# Patient Record
Sex: Male | Born: 1935 | Race: White | Hispanic: No | Smoking: Never smoker
Health system: Southern US, Community
[De-identification: ages and names within clinical notes are randomized; demographics above are authoritative.]

## PROBLEM LIST (undated history)

## (undated) DIAGNOSIS — I10 Essential (primary) hypertension: Secondary | ICD-10-CM

## (undated) DIAGNOSIS — M1990 Unspecified osteoarthritis, unspecified site: Secondary | ICD-10-CM

## (undated) DIAGNOSIS — N184 Chronic kidney disease, stage 4 (severe): Secondary | ICD-10-CM

## (undated) DIAGNOSIS — I639 Cerebral infarction, unspecified: Secondary | ICD-10-CM

## (undated) DIAGNOSIS — K5790 Diverticulosis of intestine, part unspecified, without perforation or abscess without bleeding: Secondary | ICD-10-CM

## (undated) DIAGNOSIS — R32 Unspecified urinary incontinence: Secondary | ICD-10-CM

## (undated) DIAGNOSIS — I219 Acute myocardial infarction, unspecified: Secondary | ICD-10-CM

## (undated) DIAGNOSIS — K219 Gastro-esophageal reflux disease without esophagitis: Secondary | ICD-10-CM

## (undated) DIAGNOSIS — C61 Malignant neoplasm of prostate: Secondary | ICD-10-CM

## (undated) DIAGNOSIS — I779 Disorder of arteries and arterioles, unspecified: Secondary | ICD-10-CM

## (undated) DIAGNOSIS — K8020 Calculus of gallbladder without cholecystitis without obstruction: Secondary | ICD-10-CM

## (undated) DIAGNOSIS — Z951 Presence of aortocoronary bypass graft: Secondary | ICD-10-CM

## (undated) DIAGNOSIS — I739 Peripheral vascular disease, unspecified: Secondary | ICD-10-CM

## (undated) HISTORY — DX: Malignant neoplasm of prostate: C61

## (undated) HISTORY — PX: ROTATOR CUFF REPAIR: SHX139

## (undated) HISTORY — DX: Presence of aortocoronary bypass graft: Z95.1

## (undated) HISTORY — DX: Peripheral vascular disease, unspecified: I73.9

## (undated) HISTORY — DX: Disorder of arteries and arterioles, unspecified: I77.9

## (undated) HISTORY — PX: CHOLECYSTECTOMY, LAPAROSCOPIC: SHX56

## (undated) HISTORY — PX: OTHER SURGICAL HISTORY: SHX169

## (undated) HISTORY — DX: Essential (primary) hypertension: I10

## (undated) HISTORY — DX: Cerebral infarction, unspecified: I63.9

## (undated) HISTORY — PX: CRYOTHERAPY: SHX1416

## (undated) HISTORY — DX: Diverticulosis of intestine, part unspecified, without perforation or abscess without bleeding: K57.90

## (undated) HISTORY — PX: BACK SURGERY: SHX140

## (undated) HISTORY — DX: Calculus of gallbladder without cholecystitis without obstruction: K80.20

## (undated) HISTORY — DX: Chronic kidney disease, stage 4 (severe): N18.4

---

## 1999-02-10 HISTORY — PX: CORONARY ARTERY BYPASS GRAFT: SHX141

## 2001-11-24 ENCOUNTER — Encounter: Payer: Self-pay | Admitting: Emergency Medicine

## 2001-11-24 ENCOUNTER — Emergency Department (HOSPITAL_COMMUNITY): Admission: EM | Admit: 2001-11-24 | Discharge: 2001-11-24 | Payer: Self-pay | Admitting: Emergency Medicine

## 2004-04-30 ENCOUNTER — Emergency Department (HOSPITAL_COMMUNITY): Admission: EM | Admit: 2004-04-30 | Discharge: 2004-04-30 | Payer: Self-pay | Admitting: Emergency Medicine

## 2004-05-29 ENCOUNTER — Encounter (INDEPENDENT_AMBULATORY_CARE_PROVIDER_SITE_OTHER): Payer: Self-pay | Admitting: Specialist

## 2004-05-29 ENCOUNTER — Ambulatory Visit (HOSPITAL_COMMUNITY): Admission: RE | Admit: 2004-05-29 | Discharge: 2004-05-29 | Payer: Self-pay | Admitting: Gastroenterology

## 2004-08-18 ENCOUNTER — Emergency Department (HOSPITAL_COMMUNITY): Admission: EM | Admit: 2004-08-18 | Discharge: 2004-08-18 | Payer: Self-pay | Admitting: Emergency Medicine

## 2005-11-25 ENCOUNTER — Encounter: Admission: RE | Admit: 2005-11-25 | Discharge: 2005-11-25 | Payer: Self-pay | Admitting: Gastroenterology

## 2006-02-24 ENCOUNTER — Encounter: Admission: RE | Admit: 2006-02-24 | Discharge: 2006-02-24 | Payer: Self-pay | Admitting: Family Medicine

## 2006-06-03 ENCOUNTER — Ambulatory Visit (HOSPITAL_COMMUNITY): Admission: RE | Admit: 2006-06-03 | Discharge: 2006-06-03 | Payer: Self-pay | Admitting: Surgery

## 2006-06-03 ENCOUNTER — Encounter (INDEPENDENT_AMBULATORY_CARE_PROVIDER_SITE_OTHER): Payer: Self-pay | Admitting: Specialist

## 2007-06-17 ENCOUNTER — Encounter: Admission: RE | Admit: 2007-06-17 | Discharge: 2007-06-17 | Payer: Self-pay | Admitting: Specialist

## 2007-07-07 ENCOUNTER — Encounter: Admission: RE | Admit: 2007-07-07 | Discharge: 2007-07-07 | Payer: Self-pay | Admitting: Interventional Cardiology

## 2008-04-20 ENCOUNTER — Encounter: Admission: RE | Admit: 2008-04-20 | Discharge: 2008-04-20 | Payer: Self-pay | Admitting: Family Medicine

## 2009-07-30 ENCOUNTER — Encounter: Admission: RE | Admit: 2009-07-30 | Discharge: 2009-07-30 | Payer: Self-pay | Admitting: Family Medicine

## 2009-11-08 ENCOUNTER — Ambulatory Visit (HOSPITAL_BASED_OUTPATIENT_CLINIC_OR_DEPARTMENT_OTHER): Admission: RE | Admit: 2009-11-08 | Discharge: 2009-11-08 | Payer: Self-pay | Admitting: Urology

## 2010-01-15 ENCOUNTER — Encounter
Admission: RE | Admit: 2010-01-15 | Discharge: 2010-01-15 | Payer: Self-pay | Source: Home / Self Care | Admitting: Family Medicine

## 2010-02-14 ENCOUNTER — Encounter
Admission: RE | Admit: 2010-02-14 | Discharge: 2010-02-14 | Payer: Self-pay | Source: Home / Self Care | Attending: Specialist | Admitting: Specialist

## 2010-04-18 ENCOUNTER — Emergency Department (HOSPITAL_COMMUNITY)
Admission: EM | Admit: 2010-04-18 | Discharge: 2010-04-18 | Disposition: A | Discharge status: Home / Self Care | Payer: Medicare Other | Attending: Emergency Medicine | Admitting: Emergency Medicine

## 2010-04-18 ENCOUNTER — Emergency Department (HOSPITAL_COMMUNITY): Payer: Medicare Other

## 2010-04-18 DIAGNOSIS — IMO0002 Reserved for concepts with insufficient information to code with codable children: Secondary | ICD-10-CM | POA: Insufficient documentation

## 2010-04-18 DIAGNOSIS — W298XXA Contact with other powered powered hand tools and household machinery, initial encounter: Secondary | ICD-10-CM | POA: Insufficient documentation

## 2010-04-18 DIAGNOSIS — Z79899 Other long term (current) drug therapy: Secondary | ICD-10-CM | POA: Insufficient documentation

## 2010-04-18 DIAGNOSIS — Z8673 Personal history of transient ischemic attack (TIA), and cerebral infarction without residual deficits: Secondary | ICD-10-CM | POA: Insufficient documentation

## 2010-04-18 DIAGNOSIS — M79609 Pain in unspecified limb: Secondary | ICD-10-CM | POA: Insufficient documentation

## 2010-04-18 DIAGNOSIS — S62639B Displaced fracture of distal phalanx of unspecified finger, initial encounter for open fracture: Secondary | ICD-10-CM | POA: Insufficient documentation

## 2010-04-18 DIAGNOSIS — Y92009 Unspecified place in unspecified non-institutional (private) residence as the place of occurrence of the external cause: Secondary | ICD-10-CM | POA: Insufficient documentation

## 2010-04-18 DIAGNOSIS — S6980XA Other specified injuries of unspecified wrist, hand and finger(s), initial encounter: Secondary | ICD-10-CM | POA: Insufficient documentation

## 2010-04-18 DIAGNOSIS — I10 Essential (primary) hypertension: Secondary | ICD-10-CM | POA: Insufficient documentation

## 2010-04-18 DIAGNOSIS — Z8546 Personal history of malignant neoplasm of prostate: Secondary | ICD-10-CM | POA: Insufficient documentation

## 2010-04-18 DIAGNOSIS — Z7982 Long term (current) use of aspirin: Secondary | ICD-10-CM | POA: Insufficient documentation

## 2010-04-18 DIAGNOSIS — S6990XA Unspecified injury of unspecified wrist, hand and finger(s), initial encounter: Secondary | ICD-10-CM | POA: Insufficient documentation

## 2010-04-21 NOTE — Consult Note (Signed)
Brandon Harris, Brandon Harris NO.:  [id number]  MEDICAL RECORD NO.:  [id number]           PATIENT TYPE:  E  LOCATION:  [facility]                         FACILITY:  [facility]  PHYSICIAN:  Leanora Cover, MD        DATE OF BIRTH:  [birth year]  DATE OF CONSULTATION:  04/18/2010 DATE OF DISCHARGE:  04/18/2010                                CONSULTATION   Consult is from emergency department.  Consult is for left index finger amputation.  HISTORY:  Brandon Harris is a 77 year old right-hand dominant white male, who was using a wood splitter today.  He states his left index finger became trapped between the frame and a piece of wood.  It was not caught in the wedge.  This removed the tip of the finger.  He reports no other injuries.  No previous injuries.  His tetanus was updated by the emergency department.  He has been given 1 g of IV Ancef.  He was given a digital block by the emergency department.  He was resting comfortably in a hospital stretcher.  He has had a previous amputation of the right index finger approximately 4 years ago.  ALLERGIES:  No known drug allergies.  PAST MEDICAL HISTORY: 1. Prostate cancer. 2. Hypertension. 3. Stroke. 4. Coronary artery disease.  PAST SURGICAL HISTORY: 1. Coronary artery bypass grafting. 2. Back surgeries. 3. Left rotator cuff repair. 4. Prostate cancer cryo therapy.  MEDICATIONS:  Aspirin, omeprazole, zolpidem, losartan, Vesicare, Tamsulosin, and Caduet.  SOCIAL HISTORY:  Brandon Harris is retired.  He does not smoke or use alcohol.  REVIEW OF SYSTEMS:  Thirteen-point review of systems negative.  PHYSICAL EXAMINATION:  VITAL SIGNS:  Temperature 98.2, pulse 63, respirations 16, BP 151/68. GENERAL:  Alert and oriented x3, well developed, well nourished, resting comfortably in hospital stretcher. EXTREMITIES:  Bilateral upper extremities were distally neurovascularly intact in radial, median, and ulnar distributions.  He has  intact sensation and capillary refill in all fingertips with the exception of the left index finger, which has had a digital block.  He is actively bleeding.  He can flex and extend the IP joints and crosses fingers.  He is missing the index finger on the right hand.  He has no tenderness to palpation.  No wounds in the right upper extremity.  With the exception of the index finger on the left hand, he has no wounds, no tenderness to palpation.  The left index finger, he has amputated the tip of the finger approximately halfway through the nail.  There was now a volar skin loss, but there is good subcutaneous tissue remaining.  The bone is exposed.  The nail has been avulsed.  RADIOGRAPHY:  AP, lateral, and oblique views of the left index finger show tuft fracture of the distal phalanx.  There is no radiopaque foreign body.  ASSESSMENT AND PLAN:  Left index finger tip amputation.  I discussed with Brandon Harris the nature of the injury.  I have recommended revision amputation.  Risks, benefits, and alternatives of procedure were discussed, and he wished to proceed.  Digital block had been  performed by the emergency department.  The wound was copiously irrigated with 1000 mL of sterile saline and Betadine solution.  The index finger was then prepped with Betadine.  It was draped sterilely using towels.  A Penrose drain was used as a tourniquet and was up for approximately 30 minutes.  The bone was rongeured back until soft tissue coverage could be obtained.  Once this was done, a 6-0 chromic gut suture and 5-0 Monocryl suture was used to repair the soft tissue over the end of the bone.  Full bony coverage was obtained.  It was felt that there was appropriate padding over the bone as well.  The Penrose drain was removed as the tourniquet.  There was light bleeding, but no active significant bleeding.  A piece of Xeroform was placed in the nail fold.  The wounds were then dressed  with sterile Xeroform.  The wound was dressed with 4 x 4's and wrapped with Kling and Coban dressing lightly.  Penrose drain had been removed.  He will see me in the office in 1 week for followup.  He will be given Percocet 5/325 one to two p.o. q.6 h p.r.n. pain, dispensed #40 and Bactrim DS one p.o. b.i.d. x7 days.     Leanora Cover, MD     KK/MEDQ  D:  04/18/2010  T:  04/19/2010  Job:  [id number]  Electronically Signed by Leanora Cover  on 04/21/2010 05:08:07 PM

## 2010-04-24 LAB — POCT I-STAT 4, (NA,K, GLUC, HGB,HCT)
Glucose, Bld: 110 mg/dL — ABNORMAL HIGH (ref 70–99)
HCT: 43 % (ref 39.0–52.0)
Hemoglobin: 14.6 g/dL (ref 13.0–17.0)
Potassium: 4.3 mEq/L (ref 3.5–5.1)
Sodium: 141 mEq/L (ref 135–145)

## 2010-06-27 NOTE — Op Note (Signed)
Brandon Harris, Brandon Harris                 ACCOUNT NO.:  [id number]   MEDICAL RECORD NO.:  [id number]          PATIENT TYPE:  AMB   LOCATION:  SDS                          FACILITY:  [facility]   PHYSICIAN:  Isabel Caprice. Hassell Done, MD  DATE OF BIRTH:  07-05-1928   DATE OF PROCEDURE:  06/03/2006  DATE OF DISCHARGE:                               OPERATIVE REPORT   PREOPERATIVE DIAGNOSIS:  This is a 77 year old white male with recurrent  right upper quadrant abdominal pain with gallstones.   PROCEDURE:  Laparoscopic cholecystectomy/ intraoperative cholangiogram.   SURGEON:  Isabel Caprice. Hassell Done, M.D.   ASSISTANT:  Haywood Lasso, M.D.   ANESTHESIA:  General endotracheal.   FINDINGS:  Phrygian cap of the gallbladder with large cystic duct with a  low entrance onto the common duct which was a little dilated, but with a  trifurcation real high and good filling of intrahepatic ducts and prompt  flow into the duodenum.  Evidence of chronic cholecystitis with  gallbladder stuck with chronic adhesions.   DESCRIPTION OF PROCEDURE:  Brandon Harris was taken to room 17 on Thursday,  06/03/2006, and given general anesthesia.  The abdomen was prepped with  Techni-Care and draped sterilely.  A longitudinal incision was made down  near the umbilicus and the Hassan cannula was inserted without  difficulty.  The abdomen was insufflated.  Three trocars were placed in  the upper abdomen, including the upper midline and two laterally.  The  gallbladder was grasped at the phrygian cap and brought upward.  It  looked like there were two little humps of gallbladder up near the top  of the fundus and once retracted, retraction was maintained.  Chronic  adhesions to the lower portion were stripped away.  Calot's triangle was  dissected away and a critical view was obtained.  Clips were placed on  the cystic artery and on the cystic duct proximally and I incised the  cystic duct and inserted a Reddick catheter.  A dynamic  cholangiogram  was performed which showed a filling of a plump common bile duct with  free flow into the duodenum.  No filling defects were noted.  There was  a low takeoff of the cystic duct and there were no accessory ducts noted  onto the cystic duct, but there was a good trifurcation up inside the  liver with intrahepatic radicals well seen.  The cystic duct was then  triple clipped and divided, Cystic artery was triple clipped and  divided, and the gallbladder was removed from the gallbladder bed.  There was a venous lake up in the top noted that sort of wanted to bleed  intermittently, although it did not actually bleed that much.  I went  ahead and put FloSeal on it.  Area was irrigated.  No bile leaks were  noted.  No bleeding was seen.  Looked around and surveyed the rest of  the abdomen and it looked fine.  The umbilical port was repaired with  multiple sutures of 0-Vicryl under laparoscopic vision and then the skin  was approximated with 4-0 Vicryl, Benzoin  and Steri-Strips.  The patient  had tolerated the procedure well and was taken to the recovery room in  satisfactory condition.  He expressed an interest in trying to go home  later in the day and he will be observed to see if that can happen.   FINAL DIAGNOSES:  1. Chronic cholecystitis.  2. Cholelithiasis.      Isabel Caprice Hassell Done, MD  Electronically Signed     MBM/MEDQ  D:  06/03/2006  T:  06/03/2006  Job:  [id number]   cc:   Belva Crome, M.D.  Earle Gell, M.D.  Antony Contras, M.D.

## 2010-06-27 NOTE — Consult Note (Signed)
   NAMEEVENS, SODERBERG                           ACCOUNT NO.:  [id number]   MEDICAL RECORD NO.:  [id number]                   PATIENT TYPE:  INP   LOCATION:  1823                                 FACILITY:  [facility]   PHYSICIAN:  Sheral Apley. Burney Gauze, M.D.           DATE OF BIRTH:  [birth year]   DATE OF CONSULTATION:  11/24/2001  DATE OF DISCHARGE:  11/24/2001                                   CONSULTATION   REASON FOR CONSULTATION:  This is a 77 year old, right-hand dominant male  who was using a chainsaw earlier today and sustained injury to his right  thumb with obvious open injury, nail plate avulsion, nail bed laceration,  and open distal phalangeal fracture.  He is 77 years old.   ALLERGIES:  He has no known drug allergies.   MEDICATIONS:  He is currently on Altace and Ecotrin a day, as well as  Lipitor.   PAST MEDICAL HISTORY:  He is status post bypass two or three years ago and  has no symptoms during that period of time.   PAST SURGICAL HISTORY:  Noncontributory.   SOCIAL HISTORY:  Noncontributory.  He does not smoke.  He does not drink.   FAMILY HISTORY:  Noncontributory.   PHYSICAL EXAMINATION:  GENERAL APPEARANCE:  A well-nourished male who is  pleasant.  EXTREMITIES:  On examination of his right upper extremity, he has an obvious  open injury with exposed distal phalangeal bone, fractured nail plate, and  nail bed laceration.  The STL appears to be intact.   LABORATORY DATA:  X-rays show a comminuted fracture of the distal phalanx  and thumb on the right.   IMPRESSION:  A 77 year old male an open fracture of the distal phalanx and  thumb on the right.   PLAN:  We discussed at the point in time recommendations.  He will be taken  to the operating room for an I&D and ORIF of the distal phalanx and  displaced fracture.  We will do this as soon as possible as an outpatient  here at [facility]. [place]. Burney Gauze, M.D.    MAW/MEDQ  D:  11/24/2001  T:  11/26/2001  Job:  [id number]

## 2010-06-27 NOTE — Op Note (Signed)
NAMECOURVOISIER, VREDEVELD NO.:  [id number]   MEDICAL RECORD NO.:  [id number]          PATIENT TYPE:  AMB   LOCATION:  ENDO                         [facility]ACILITY:  [facility]   PHYSICIAN:  Earle Gell, M.D.   DATE OF BIRTH:  Jul 19, 1928   DATE OF PROCEDURE:  05/29/2004  DATE OF DISCHARGE:                                 OPERATIVE REPORT   PROCEDURE:  Colonoscopy and polypectomy.   INDICATIONS:  Mr. Brandon Harris is a 77 year old male, born 01/23/1929.  Mr. Brandon Harris is scheduled to undergo his first screening colonoscopy with  polypectomy to prevent colon cancer.  His daughter was recently diagnosed  with rectal cancer.   ENDOSCOPIST:  Earle Gell, M.D.   PREMEDICATION:  Versed 7 mg, fentanyl  70 mcg.   DESCRIPTION OF PROCEDURE:  After obtaining informed consent, Mr. Brandon Harris was  placed in the left lateral decubitus position.  I administered intravenous  fentanyl and intravenous Versed to achieve conscious sedation for the  procedure.  The patient's blood pressure, oxygen saturation and cardiac  rhythm were monitored throughout the procedure and documented in the medical  record.   Anal inspection was normal.  Digital rectal exam reveals a non-nodular  prostate.  The Olympus adjustable pediatric colonoscope was introduced into  the rectum and advanced to the cecum.  Colonic preparation for the exam  today was satisfactory.   Rectum:  Normal.  Retroflexed view of the distal rectum normal.  Sigmoid colon and descending colon:  Left colonic diverticulosis.  At 40 cm  from the anal verge, a 3 mm pedunculated polyp was removed with  electrocautery snare.  At 35 cm from the anal verge, a 4 mm pedunculated  polyp was removed with electrocautery snare.  Splenic flexure:  Normal.  Transverse colon:  Normal.  Hepatic flexure:  Normal.  Ascending colon:  Normal.  Cecum and ileocecal valve:  From the mid rectum, a 2 mm sessile polyp was  lifted by submucosal saline  injection and removed with the electrocautery  snare.  There was a small amount of bleeding post polypectomy which was  controlled by the application of two endoclips.   ASSESSMENT:  1.  Left colonic diverticulosis.  2.  From the sigmoid colon, a 3 mm pedunculated polyp and a 4 mm      pedunculated polyp were removed.  3.  From the mid cecum 2 mm polyp was removed with the electrocautery snare      and the polypectomy site endoclipped.      MJ/MEDQ  D:  05/29/2004  T:  05/29/2004  Job:  [id number]   cc:   Antony Contras, M.D.  [address]  [place], [place] [address]  Fax: [phone]

## 2010-06-27 NOTE — Op Note (Signed)
Brandon Harris, Brandon Harris                           ACCOUNT NO.:  [id number]   MEDICAL RECORD NO.:  [id number]                   PATIENT TYPE:  INP   LOCATION:  1823                                 FACILITY:  [facility]   PHYSICIAN:  Sheral Apley. Burney Gauze, M.D.           DATE OF BIRTH:  Nov 07, 1928   DATE OF PROCEDURE:  11/24/2001  DATE OF DISCHARGE:                                 OPERATIVE REPORT   PREOPERATIVE DIAGNOSES:  Right thumb open distal phalangeal fracture and  nail bed laceration.   POSTOPERATIVE DIAGNOSES:  Right thumb open distal phalangeal fracture and  nail bed laceration.   PROCEDURE:  Irrigation and debridement of open fracture, right thumb,  laceration repair, nail bed repair and open reduction and internal fixation  using two 0.35 K-wires from distal to proximal across the fracture site.   SURGEON:  Sheral Apley. Burney Gauze, M.D.   ASSISTANT:  None.   ANESTHESIA:  Axillary block.   TOURNIQUET TIME:  Twenty-one minutes.   COMPLICATIONS:  No complications.   DRAINS:  No drains.   DESCRIPTION OF PROCEDURE:  The patient was taken to the operating room and  after the adequate axillary block analgesia, the right upper extremity was  prepped and draped in the usual sterile fashion.  The arm was elevated for 2-  1/2 minutes; the tourniquet was then inflated to 250 mmHg.  At this point in  time, an obvious open injury to the distal aspect of the right thumb was  approached.  There was a complex laceration volarly, going from midline all  the way to the ulnar border.  This was irrigated and debrided of clot and  some nonviable-type material that appeared to have a grease-like consistency  from the saw he was probably using prior to his accident.  The open fracture  was debrided of again clot and nonviable material.  Once this was done, the  5-0 nylon was used to approximate the skin, closing the laceration loosely,  and after that was done, two 0.35 K-wires were driven from the  distal  fragment to the proximal fragment across the fracture site, with care not to  cross the DIP joint.  Intraoperative x-rays showed good reduction in both  the AP and lateral plane.  The wires were then cut outside the skin and bent  upon themselves, then using 6-0 chromic, a complex nail bed laceration was  repaired using five sutures.  The wound was then dressed with Xeroform, 4 x  4's and a volar splint as well as a Coban wrap.  The patient tolerated the  procedure well and went to the recovery room in stable fashion.  Sheral Apley Burney Gauze, M.D.    MAW/MEDQ  D:  11/24/2001  T:  11/25/2001  Job:  [id number]

## 2010-12-30 ENCOUNTER — Other Ambulatory Visit: Payer: Self-pay | Admitting: Gastroenterology

## 2011-02-12 DIAGNOSIS — C61 Malignant neoplasm of prostate: Secondary | ICD-10-CM | POA: Diagnosis not present

## 2011-02-19 DIAGNOSIS — C61 Malignant neoplasm of prostate: Secondary | ICD-10-CM | POA: Diagnosis not present

## 2011-03-06 DIAGNOSIS — G479 Sleep disorder, unspecified: Secondary | ICD-10-CM | POA: Diagnosis not present

## 2011-03-06 DIAGNOSIS — C61 Malignant neoplasm of prostate: Secondary | ICD-10-CM | POA: Diagnosis not present

## 2011-03-06 DIAGNOSIS — K219 Gastro-esophageal reflux disease without esophagitis: Secondary | ICD-10-CM | POA: Diagnosis not present

## 2011-03-06 DIAGNOSIS — I10 Essential (primary) hypertension: Secondary | ICD-10-CM | POA: Diagnosis not present

## 2011-03-06 DIAGNOSIS — E782 Mixed hyperlipidemia: Secondary | ICD-10-CM | POA: Diagnosis not present

## 2011-03-06 DIAGNOSIS — R0989 Other specified symptoms and signs involving the circulatory and respiratory systems: Secondary | ICD-10-CM | POA: Diagnosis not present

## 2011-04-14 DIAGNOSIS — M1710 Unilateral primary osteoarthritis, unspecified knee: Secondary | ICD-10-CM | POA: Diagnosis not present

## 2011-04-15 DIAGNOSIS — L821 Other seborrheic keratosis: Secondary | ICD-10-CM | POA: Diagnosis not present

## 2011-04-15 DIAGNOSIS — Z85828 Personal history of other malignant neoplasm of skin: Secondary | ICD-10-CM | POA: Diagnosis not present

## 2011-05-28 DIAGNOSIS — E785 Hyperlipidemia, unspecified: Secondary | ICD-10-CM | POA: Diagnosis not present

## 2011-05-28 DIAGNOSIS — I10 Essential (primary) hypertension: Secondary | ICD-10-CM | POA: Diagnosis not present

## 2011-05-28 DIAGNOSIS — I6529 Occlusion and stenosis of unspecified carotid artery: Secondary | ICD-10-CM | POA: Diagnosis not present

## 2011-05-28 DIAGNOSIS — I2510 Atherosclerotic heart disease of native coronary artery without angina pectoris: Secondary | ICD-10-CM | POA: Diagnosis not present

## 2011-06-05 DIAGNOSIS — I6529 Occlusion and stenosis of unspecified carotid artery: Secondary | ICD-10-CM | POA: Diagnosis not present

## 2011-08-19 DIAGNOSIS — C61 Malignant neoplasm of prostate: Secondary | ICD-10-CM | POA: Diagnosis not present

## 2011-08-26 DIAGNOSIS — C61 Malignant neoplasm of prostate: Secondary | ICD-10-CM | POA: Diagnosis not present

## 2011-09-03 DIAGNOSIS — K219 Gastro-esophageal reflux disease without esophagitis: Secondary | ICD-10-CM | POA: Diagnosis not present

## 2011-09-03 DIAGNOSIS — E782 Mixed hyperlipidemia: Secondary | ICD-10-CM | POA: Diagnosis not present

## 2011-09-03 DIAGNOSIS — G479 Sleep disorder, unspecified: Secondary | ICD-10-CM | POA: Diagnosis not present

## 2011-09-03 DIAGNOSIS — I10 Essential (primary) hypertension: Secondary | ICD-10-CM | POA: Diagnosis not present

## 2011-09-03 DIAGNOSIS — C61 Malignant neoplasm of prostate: Secondary | ICD-10-CM | POA: Diagnosis not present

## 2011-09-03 DIAGNOSIS — R0989 Other specified symptoms and signs involving the circulatory and respiratory systems: Secondary | ICD-10-CM | POA: Diagnosis not present

## 2011-10-02 DIAGNOSIS — M7520 Bicipital tendinitis, unspecified shoulder: Secondary | ICD-10-CM | POA: Insufficient documentation

## 2011-10-02 DIAGNOSIS — M79609 Pain in unspecified limb: Secondary | ICD-10-CM | POA: Diagnosis not present

## 2011-10-15 DIAGNOSIS — M7520 Bicipital tendinitis, unspecified shoulder: Secondary | ICD-10-CM | POA: Diagnosis not present

## 2011-10-20 DIAGNOSIS — M19019 Primary osteoarthritis, unspecified shoulder: Secondary | ICD-10-CM | POA: Diagnosis not present

## 2011-10-27 DIAGNOSIS — M7520 Bicipital tendinitis, unspecified shoulder: Secondary | ICD-10-CM | POA: Diagnosis not present

## 2011-11-03 DIAGNOSIS — M7520 Bicipital tendinitis, unspecified shoulder: Secondary | ICD-10-CM | POA: Diagnosis not present

## 2011-11-10 DIAGNOSIS — M7520 Bicipital tendinitis, unspecified shoulder: Secondary | ICD-10-CM | POA: Diagnosis not present

## 2011-11-24 DIAGNOSIS — M7520 Bicipital tendinitis, unspecified shoulder: Secondary | ICD-10-CM | POA: Diagnosis not present

## 2011-12-04 DIAGNOSIS — M79609 Pain in unspecified limb: Secondary | ICD-10-CM | POA: Diagnosis not present

## 2011-12-04 DIAGNOSIS — M25529 Pain in unspecified elbow: Secondary | ICD-10-CM | POA: Diagnosis not present

## 2011-12-04 DIAGNOSIS — M25519 Pain in unspecified shoulder: Secondary | ICD-10-CM | POA: Diagnosis not present

## 2011-12-04 DIAGNOSIS — M7520 Bicipital tendinitis, unspecified shoulder: Secondary | ICD-10-CM | POA: Diagnosis not present

## 2011-12-04 DIAGNOSIS — R29898 Other symptoms and signs involving the musculoskeletal system: Secondary | ICD-10-CM | POA: Diagnosis not present

## 2011-12-15 DIAGNOSIS — M1710 Unilateral primary osteoarthritis, unspecified knee: Secondary | ICD-10-CM | POA: Diagnosis not present

## 2012-01-14 DIAGNOSIS — Z23 Encounter for immunization: Secondary | ICD-10-CM | POA: Diagnosis not present

## 2012-01-27 DIAGNOSIS — I6529 Occlusion and stenosis of unspecified carotid artery: Secondary | ICD-10-CM | POA: Diagnosis not present

## 2012-02-23 DIAGNOSIS — C61 Malignant neoplasm of prostate: Secondary | ICD-10-CM | POA: Diagnosis not present

## 2012-02-25 DIAGNOSIS — M1710 Unilateral primary osteoarthritis, unspecified knee: Secondary | ICD-10-CM | POA: Diagnosis not present

## 2012-02-26 DIAGNOSIS — C61 Malignant neoplasm of prostate: Secondary | ICD-10-CM | POA: Diagnosis not present

## 2012-03-03 DIAGNOSIS — M1710 Unilateral primary osteoarthritis, unspecified knee: Secondary | ICD-10-CM | POA: Diagnosis not present

## 2012-03-07 DIAGNOSIS — K219 Gastro-esophageal reflux disease without esophagitis: Secondary | ICD-10-CM | POA: Diagnosis not present

## 2012-03-07 DIAGNOSIS — C61 Malignant neoplasm of prostate: Secondary | ICD-10-CM | POA: Diagnosis not present

## 2012-03-07 DIAGNOSIS — I10 Essential (primary) hypertension: Secondary | ICD-10-CM | POA: Diagnosis not present

## 2012-03-07 DIAGNOSIS — R0989 Other specified symptoms and signs involving the circulatory and respiratory systems: Secondary | ICD-10-CM | POA: Diagnosis not present

## 2012-03-07 DIAGNOSIS — G479 Sleep disorder, unspecified: Secondary | ICD-10-CM | POA: Diagnosis not present

## 2012-03-07 DIAGNOSIS — E782 Mixed hyperlipidemia: Secondary | ICD-10-CM | POA: Diagnosis not present

## 2012-03-09 DIAGNOSIS — M1710 Unilateral primary osteoarthritis, unspecified knee: Secondary | ICD-10-CM | POA: Diagnosis not present

## 2012-03-17 DIAGNOSIS — M1710 Unilateral primary osteoarthritis, unspecified knee: Secondary | ICD-10-CM | POA: Diagnosis not present

## 2012-03-22 DIAGNOSIS — M1710 Unilateral primary osteoarthritis, unspecified knee: Secondary | ICD-10-CM | POA: Diagnosis not present

## 2012-04-18 DIAGNOSIS — Z85828 Personal history of other malignant neoplasm of skin: Secondary | ICD-10-CM | POA: Diagnosis not present

## 2012-04-18 DIAGNOSIS — L821 Other seborrheic keratosis: Secondary | ICD-10-CM | POA: Diagnosis not present

## 2012-04-18 DIAGNOSIS — C44221 Squamous cell carcinoma of skin of unspecified ear and external auricular canal: Secondary | ICD-10-CM | POA: Diagnosis not present

## 2012-04-18 DIAGNOSIS — D485 Neoplasm of uncertain behavior of skin: Secondary | ICD-10-CM | POA: Diagnosis not present

## 2012-05-05 DIAGNOSIS — M1710 Unilateral primary osteoarthritis, unspecified knee: Secondary | ICD-10-CM | POA: Diagnosis not present

## 2012-05-06 DIAGNOSIS — C44221 Squamous cell carcinoma of skin of unspecified ear and external auricular canal: Secondary | ICD-10-CM | POA: Diagnosis not present

## 2012-05-12 DIAGNOSIS — M1710 Unilateral primary osteoarthritis, unspecified knee: Secondary | ICD-10-CM | POA: Diagnosis not present

## 2012-05-19 DIAGNOSIS — M1710 Unilateral primary osteoarthritis, unspecified knee: Secondary | ICD-10-CM | POA: Diagnosis not present

## 2012-05-26 DIAGNOSIS — M1710 Unilateral primary osteoarthritis, unspecified knee: Secondary | ICD-10-CM | POA: Diagnosis not present

## 2012-06-02 DIAGNOSIS — M1710 Unilateral primary osteoarthritis, unspecified knee: Secondary | ICD-10-CM | POA: Diagnosis not present

## 2012-07-15 DIAGNOSIS — M1710 Unilateral primary osteoarthritis, unspecified knee: Secondary | ICD-10-CM | POA: Diagnosis not present

## 2012-08-06 ENCOUNTER — Emergency Department (HOSPITAL_COMMUNITY)
Admission: EM | Admit: 2012-08-06 | Discharge: 2012-08-06 | Disposition: A | Discharge status: Home / Self Care | Payer: Medicare Other | Attending: Emergency Medicine | Admitting: Emergency Medicine

## 2012-08-06 ENCOUNTER — Encounter (HOSPITAL_COMMUNITY): Payer: Self-pay | Admitting: Emergency Medicine

## 2012-08-06 DIAGNOSIS — Z79899 Other long term (current) drug therapy: Secondary | ICD-10-CM | POA: Diagnosis not present

## 2012-08-06 DIAGNOSIS — R5381 Other malaise: Secondary | ICD-10-CM | POA: Diagnosis not present

## 2012-08-06 DIAGNOSIS — N390 Urinary tract infection, site not specified: Secondary | ICD-10-CM | POA: Insufficient documentation

## 2012-08-06 DIAGNOSIS — Z8546 Personal history of malignant neoplasm of prostate: Secondary | ICD-10-CM | POA: Diagnosis not present

## 2012-08-06 DIAGNOSIS — R3915 Urgency of urination: Secondary | ICD-10-CM | POA: Insufficient documentation

## 2012-08-06 DIAGNOSIS — R319 Hematuria, unspecified: Secondary | ICD-10-CM | POA: Insufficient documentation

## 2012-08-06 LAB — URINE MICROSCOPIC-ADD ON

## 2012-08-06 LAB — URINALYSIS, ROUTINE W REFLEX MICROSCOPIC
Bilirubin Urine: NEGATIVE
Glucose, UA: NEGATIVE mg/dL
Ketones, ur: NEGATIVE mg/dL
Nitrite: POSITIVE — AB
Protein, ur: NEGATIVE mg/dL
Specific Gravity, Urine: 1.024 (ref 1.005–1.030)
Urobilinogen, UA: 1 mg/dL (ref 0.0–1.0)
pH: 5 (ref 5.0–8.0)

## 2012-08-06 MED ORDER — CEFTRIAXONE SODIUM 1 G IJ SOLR
1.0000 g | Freq: Once | INTRAMUSCULAR | Status: AC
Start: 2012-08-06 — End: 2012-08-06
  Administered 2012-08-06: 1 g via INTRAMUSCULAR
  Filled 2012-08-06: qty 10

## 2012-08-06 MED ORDER — CEPHALEXIN 500 MG PO CAPS: 500.0000 mg | ORAL_CAPSULE | Freq: Three times a day (TID) | ORAL | Status: DC

## 2012-08-06 MED ORDER — PHENAZOPYRIDINE HCL 200 MG PO TABS: 200.0000 mg | ORAL_TABLET | Freq: Three times a day (TID) | ORAL | Status: DC | PRN

## 2012-08-06 MED ORDER — CEPHALEXIN 500 MG PO CAPS: 500.0000 mg | ORAL_CAPSULE | Freq: Four times a day (QID) | ORAL | Status: DC

## 2012-08-06 MED ORDER — DEXTROSE 5 % IV SOLN: 1.0000 g | INTRAVENOUS | Status: DC

## 2012-08-06 MED ORDER — PHENAZOPYRIDINE HCL 200 MG PO TABS
200.0000 mg | ORAL_TABLET | Freq: Once | ORAL | Status: AC
  Administered 2012-08-06: 200 mg via ORAL
  Filled 2012-08-06: qty 1

## 2012-08-06 NOTE — ED Notes (Signed)
57 french cath removed with no complications. 200cc in cath. Marzetta Board, RN aware.

## 2012-08-06 NOTE — ED Notes (Signed)
16 french cath inserted with no complications. 5cc of urine upon return. Stacy RN aware.

## 2012-08-06 NOTE — ED Provider Notes (Signed)
History    CSN: [id number] Arrival date & time 08/06/12  [id number]  First MD Initiated Contact with Patient 08/06/12 [phone]     Chief Complaint  Patient presents with  . urine retention    (Consider location/radiation/quality/duration/timing/severity/associated sxs/prior Treatment) The history is provided by the patient.  pt c/o hematuria, urgency, in past 1-2 days. Now feels as if cannot empty bladder. Is urinating small amounts/dribbling. Was up to bathroom every times last night, but felt unable to empty bladder. Had noted mild weakness of stream in day prior. Denies hx same symptoms. No hx uti. Hx prostate ca s/p cryoablation procedure 3 years ago. No back or flank pain. No abd pain. No nv. No fever or chills. Denies scrotal or testicular pain or swelling. No anticoag use. Takes a baby asa a day.    History reviewed. No pertinent past medical history. Past Surgical History  Procedure Laterality Date  . Prostate cancer     No family history on file. History  Substance Use Topics  . Smoking status: Not on file  . Smokeless tobacco: Not on file  . Alcohol Use: Not on file    Review of Systems  Constitutional: Negative for fever.  HENT: Negative for nosebleeds and neck pain.   Eyes: Negative for redness.  Respiratory: Negative for shortness of breath.   Cardiovascular: Negative for chest pain.  Gastrointestinal: Negative for abdominal pain.  Genitourinary: Negative for flank pain.  Musculoskeletal: Negative for back pain.  Skin: Negative for rash.  Neurological: Negative for headaches.  Hematological: Does not bruise/bleed easily.  Psychiatric/Behavioral: Negative for confusion.    Allergies  Review of patient's allergies indicates not on file.  Home Medications  No current outpatient prescriptions on file. BP 151/65  Pulse 78  Resp 18  SpO2 96% Physical Exam  Nursing note and vitals reviewed. Constitutional: He is oriented to person, place, and time. He appears  well-developed and well-nourished. No distress.  HENT:  Head: Atraumatic.  Eyes: Conjunctivae are normal.  Neck: Neck supple. No tracheal deviation present.  Cardiovascular: Normal rate.   Pulmonary/Chest: Effort normal. No accessory muscle usage. No respiratory distress.  Abdominal: Soft. He exhibits no distension and no mass. There is no tenderness.  Genitourinary:  Normal ext gen. No cva tenderness  Musculoskeletal: Normal range of motion. He exhibits no edema.  Neurological: He is alert and oriented to person, place, and time.  Skin: Skin is warm and dry.  Psychiatric: He has a normal mood and affect.    ED Course  Procedures (including critical care time)  Results for orders placed during the hospital encounter of 08/06/12  URINALYSIS, ROUTINE W REFLEX MICROSCOPIC      Result Value Range   Color, Urine YELLOW  YELLOW   APPearance TURBID (*) CLEAR   Specific Gravity, Urine 1.024  1.005 - 1.030   pH 5.0  5.0 - 8.0   Glucose, UA NEGATIVE  NEGATIVE mg/dL   Hgb urine dipstick SMALL (*) NEGATIVE   Bilirubin Urine NEGATIVE  NEGATIVE   Ketones, ur NEGATIVE  NEGATIVE mg/dL   Protein, ur NEGATIVE  NEGATIVE mg/dL   Urobilinogen, UA 1.0  0.0 - 1.0 mg/dL   Nitrite POSITIVE (*) NEGATIVE   Leukocytes, UA LARGE (*) NEGATIVE  URINE MICROSCOPIC-ADD ON      Result Value Range   Squamous Epithelial / LPF RARE  RARE   WBC, UA TOO NUMEROUS TO COUNT  <3 WBC/hpf   Bacteria, UA MANY (*) RARE  MDM  Ua. Foley, check pvr.  Reviewed nursing notes and prior charts for additional history.   Dose of rocephin given in ed. Urine culture sent.  pvr only 30 cc, foley d/c'd.   abd soft nt. Pt afeb.  Stable for d/c.     Mirna Mires, MD 08/06/12 [phone]

## 2012-08-06 NOTE — ED Notes (Signed)
Pt states that night before last he started having urinary retention. Then last night he states he had to get up and go to the bathroom about every 58mins and only small amounts of urine come out. Pt has PMH prostate cancer three years ago and had it frozen. Pt called urology this morning and was told to come on in ED for eval.  Pt also states urine is dark in color with some blood noted.

## 2012-08-08 LAB — URINE CULTURE: Colony Count: >=100000

## 2012-08-09 ENCOUNTER — Telehealth (HOSPITAL_COMMUNITY): Payer: Self-pay | Admitting: Emergency Medicine

## 2012-08-09 NOTE — Telephone Encounter (Signed)
Daughter called and pt gave permission for flowmanager to talk with her. He had urine culture done on 08/06/12 and was prescribed cephalexin. According to the urine culture it was sensitive to cephalexin. He has appt with his urologist today.

## 2012-08-10 DIAGNOSIS — N390 Urinary tract infection, site not specified: Secondary | ICD-10-CM | POA: Diagnosis not present

## 2012-08-11 NOTE — ED Notes (Signed)
Post ED Visit - Positive Culture Follow-up  Culture report reviewed by antimicrobial stewardship pharmacist: []  Wes Dulaney, Pharm.D., BCPS [x]  Heide Guile, Pharm.D., BCPS []  Alycia Rossetti, Pharm.D., BCPS []  [place], Pharm.D., BCPS, AAHIVP []  Legrand Como, Pharm.D., BCPS, AAHIVP  Positive urine culture Treated with cephalexin, organism sensitive to the same and no further patient follow-up is required at this time.  Varney Baas 08/11/2012, 5:09 PM

## 2012-08-22 DIAGNOSIS — C61 Malignant neoplasm of prostate: Secondary | ICD-10-CM | POA: Diagnosis not present

## 2012-08-29 DIAGNOSIS — C61 Malignant neoplasm of prostate: Secondary | ICD-10-CM | POA: Diagnosis not present

## 2012-08-29 DIAGNOSIS — N390 Urinary tract infection, site not specified: Secondary | ICD-10-CM | POA: Diagnosis not present

## 2012-09-16 DIAGNOSIS — K219 Gastro-esophageal reflux disease without esophagitis: Secondary | ICD-10-CM | POA: Diagnosis not present

## 2012-09-16 DIAGNOSIS — M542 Cervicalgia: Secondary | ICD-10-CM | POA: Diagnosis not present

## 2012-09-16 DIAGNOSIS — G479 Sleep disorder, unspecified: Secondary | ICD-10-CM | POA: Diagnosis not present

## 2012-09-16 DIAGNOSIS — R0989 Other specified symptoms and signs involving the circulatory and respiratory systems: Secondary | ICD-10-CM | POA: Diagnosis not present

## 2012-09-16 DIAGNOSIS — E782 Mixed hyperlipidemia: Secondary | ICD-10-CM | POA: Diagnosis not present

## 2012-09-16 DIAGNOSIS — Z1331 Encounter for screening for depression: Secondary | ICD-10-CM | POA: Diagnosis not present

## 2012-09-16 DIAGNOSIS — I10 Essential (primary) hypertension: Secondary | ICD-10-CM | POA: Diagnosis not present

## 2012-09-27 DIAGNOSIS — N390 Urinary tract infection, site not specified: Secondary | ICD-10-CM | POA: Diagnosis not present

## 2012-10-03 DIAGNOSIS — M1710 Unilateral primary osteoarthritis, unspecified knee: Secondary | ICD-10-CM | POA: Diagnosis not present

## 2012-10-11 DIAGNOSIS — Z23 Encounter for immunization: Secondary | ICD-10-CM | POA: Diagnosis not present

## 2012-10-11 DIAGNOSIS — G4700 Insomnia, unspecified: Secondary | ICD-10-CM | POA: Diagnosis not present

## 2012-10-11 DIAGNOSIS — N390 Urinary tract infection, site not specified: Secondary | ICD-10-CM | POA: Diagnosis not present

## 2012-10-12 DIAGNOSIS — M1710 Unilateral primary osteoarthritis, unspecified knee: Secondary | ICD-10-CM | POA: Diagnosis not present

## 2012-10-17 DIAGNOSIS — N390 Urinary tract infection, site not specified: Secondary | ICD-10-CM | POA: Diagnosis not present

## 2012-10-17 DIAGNOSIS — C61 Malignant neoplasm of prostate: Secondary | ICD-10-CM | POA: Diagnosis not present

## 2012-10-19 DIAGNOSIS — M1710 Unilateral primary osteoarthritis, unspecified knee: Secondary | ICD-10-CM | POA: Diagnosis not present

## 2012-11-07 DIAGNOSIS — Z85828 Personal history of other malignant neoplasm of skin: Secondary | ICD-10-CM | POA: Diagnosis not present

## 2012-11-28 DIAGNOSIS — M542 Cervicalgia: Secondary | ICD-10-CM | POA: Diagnosis not present

## 2012-12-06 DIAGNOSIS — M542 Cervicalgia: Secondary | ICD-10-CM | POA: Diagnosis not present

## 2012-12-09 DIAGNOSIS — M542 Cervicalgia: Secondary | ICD-10-CM | POA: Diagnosis not present

## 2012-12-16 DIAGNOSIS — M542 Cervicalgia: Secondary | ICD-10-CM | POA: Diagnosis not present

## 2012-12-16 DIAGNOSIS — J309 Allergic rhinitis, unspecified: Secondary | ICD-10-CM | POA: Diagnosis not present

## 2012-12-16 DIAGNOSIS — F430 Acute stress reaction: Secondary | ICD-10-CM | POA: Diagnosis not present

## 2012-12-16 DIAGNOSIS — E782 Mixed hyperlipidemia: Secondary | ICD-10-CM | POA: Diagnosis not present

## 2012-12-16 DIAGNOSIS — I10 Essential (primary) hypertension: Secondary | ICD-10-CM | POA: Diagnosis not present

## 2012-12-16 DIAGNOSIS — G479 Sleep disorder, unspecified: Secondary | ICD-10-CM | POA: Diagnosis not present

## 2012-12-16 DIAGNOSIS — K219 Gastro-esophageal reflux disease without esophagitis: Secondary | ICD-10-CM | POA: Diagnosis not present

## 2012-12-20 DIAGNOSIS — M542 Cervicalgia: Secondary | ICD-10-CM | POA: Diagnosis not present

## 2012-12-23 DIAGNOSIS — M542 Cervicalgia: Secondary | ICD-10-CM | POA: Diagnosis not present

## 2012-12-26 DIAGNOSIS — M542 Cervicalgia: Secondary | ICD-10-CM | POA: Diagnosis not present

## 2013-01-03 DIAGNOSIS — M542 Cervicalgia: Secondary | ICD-10-CM | POA: Diagnosis not present

## 2013-01-09 DIAGNOSIS — M5030 Other cervical disc degeneration, unspecified cervical region: Secondary | ICD-10-CM | POA: Diagnosis not present

## 2013-01-24 DIAGNOSIS — M5030 Other cervical disc degeneration, unspecified cervical region: Secondary | ICD-10-CM | POA: Diagnosis not present

## 2013-01-27 ENCOUNTER — Ambulatory Visit: Payer: PRIVATE HEALTH INSURANCE | Admitting: Interventional Cardiology

## 2013-01-27 ENCOUNTER — Telehealth: Payer: Self-pay | Admitting: Interventional Cardiology

## 2013-01-27 NOTE — Telephone Encounter (Signed)
New message    Pt said he is scheduled to have a carotid doppler on an [facility] cardiology schedule print out.  He does not have a carotid appt scheduled in epic.  Is he due for a carotid doppler?

## 2013-01-27 NOTE — Telephone Encounter (Signed)
Pt needs scheduled for carotid doppler and then an annual appointment.  He was last seen in April 2013. Called and spoke with pts daughter, Wylie Hail, who states all of the above.  Pt is not at home at this time.  Informed her someone will call back to schedule these appointments with him.

## 2013-02-06 ENCOUNTER — Encounter: Payer: Self-pay | Admitting: Cardiology

## 2013-02-06 ENCOUNTER — Ambulatory Visit (HOSPITAL_COMMUNITY): Payer: Medicare Other | Attending: Cardiology

## 2013-02-06 DIAGNOSIS — I10 Essential (primary) hypertension: Secondary | ICD-10-CM | POA: Insufficient documentation

## 2013-02-06 DIAGNOSIS — I658 Occlusion and stenosis of other precerebral arteries: Secondary | ICD-10-CM | POA: Insufficient documentation

## 2013-02-06 DIAGNOSIS — I2510 Atherosclerotic heart disease of native coronary artery without angina pectoris: Secondary | ICD-10-CM | POA: Insufficient documentation

## 2013-02-06 DIAGNOSIS — I6509 Occlusion and stenosis of unspecified vertebral artery: Secondary | ICD-10-CM | POA: Diagnosis not present

## 2013-02-06 DIAGNOSIS — E785 Hyperlipidemia, unspecified: Secondary | ICD-10-CM | POA: Diagnosis not present

## 2013-02-06 DIAGNOSIS — Z8673 Personal history of transient ischemic attack (TIA), and cerebral infarction without residual deficits: Secondary | ICD-10-CM | POA: Insufficient documentation

## 2013-02-06 DIAGNOSIS — Z951 Presence of aortocoronary bypass graft: Secondary | ICD-10-CM | POA: Insufficient documentation

## 2013-02-06 DIAGNOSIS — I6529 Occlusion and stenosis of unspecified carotid artery: Secondary | ICD-10-CM

## 2013-02-10 ENCOUNTER — Encounter (HOSPITAL_COMMUNITY): Payer: Self-pay | Admitting: Interventional Cardiology

## 2013-02-24 ENCOUNTER — Telehealth: Payer: Self-pay

## 2013-02-24 DIAGNOSIS — I6529 Occlusion and stenosis of unspecified carotid artery: Secondary | ICD-10-CM

## 2013-02-24 NOTE — Telephone Encounter (Signed)
Message copied by Lamar Laundry on Fri Feb 24, 2013  8:36 AM ------      Message from: Daneen Schick      Created: Sun Feb 19, 2013  4:23 PM       Stable and needs 1 year f/u. ------

## 2013-02-28 ENCOUNTER — Encounter: Payer: Self-pay | Admitting: Interventional Cardiology

## 2013-02-28 ENCOUNTER — Ambulatory Visit (INDEPENDENT_AMBULATORY_CARE_PROVIDER_SITE_OTHER): Payer: Medicare Other | Admitting: Interventional Cardiology

## 2013-02-28 VITALS — BP 128/66 | HR 66 | Ht 71.0 in | Wt 214.0 lb

## 2013-02-28 DIAGNOSIS — I739 Peripheral vascular disease, unspecified: Secondary | ICD-10-CM

## 2013-02-28 DIAGNOSIS — I779 Disorder of arteries and arterioles, unspecified: Secondary | ICD-10-CM | POA: Diagnosis not present

## 2013-02-28 DIAGNOSIS — I10 Essential (primary) hypertension: Secondary | ICD-10-CM | POA: Insufficient documentation

## 2013-02-28 DIAGNOSIS — I25810 Atherosclerosis of coronary artery bypass graft(s) without angina pectoris: Secondary | ICD-10-CM | POA: Diagnosis not present

## 2013-02-28 DIAGNOSIS — E785 Hyperlipidemia, unspecified: Secondary | ICD-10-CM | POA: Diagnosis not present

## 2013-02-28 DIAGNOSIS — I25709 Atherosclerosis of coronary artery bypass graft(s), unspecified, with unspecified angina pectoris: Secondary | ICD-10-CM | POA: Insufficient documentation

## 2013-02-28 NOTE — Progress Notes (Signed)
[name], male   DOB: [birth year], 80 y.o.   MRN: [id number]    [address] [place], [place]  [address] Phone: [phone] Fax:  [phone]  Date:  02/28/2013   ID:  Brandon Harris, DOB 1928-10-04, MRN [id number]  PCP:  Gara Kroner, MD    ASSESSMENT:  1. Coronary artery disease stable without angina 2. Carotid disease without neurological complaints 3. Hypertension under good control 4. Hyperlipidemia, stable  PLAN:  1. Maintain an active lifestyle. Call if chest discomfort or dyspnea. 2. Clinical followup in one year 3. Carotid Doppler study in one year   SUBJECTIVE: Brandon Harris is a 80 y.o. male is doing well. Maintain an active lifestyle. He denies angina. No dyspnea, palpitations, syncope, or edema. No medication side effects.   Wt Readings from Last 3 Encounters:  02/28/13 214 lb (97.07 kg)     Past Medical History  Diagnosis Date  . Prostate cancer   . Hypertension   . Stroke   . Status post coronary artery bypass grafting      intraoperative cholangiogram  . Gallstones   . Diverticulosis     colonic diverticulosis    Current Outpatient Prescriptions  Medication Sig Dispense Refill  . ALPRAZolam (XANAX) 0.25 MG tablet TAKE ONE TABLET AT NIGHT      . amLODipine (NORVASC) 10 MG tablet Take 10 mg by mouth daily.      Marland Kitchen atorvastatin (LIPITOR) 80 MG tablet Take 80 mg by mouth daily.      . cephALEXin (KEFLEX) 500 MG capsule Take 1 capsule (500 mg total) by mouth 4 (four) times daily.  28 capsule  0  . cholecalciferol (VITAMIN D) 1000 UNITS tablet Take 1,000 Units by mouth daily.      Marland Kitchen losartan (COZAAR) 100 MG tablet Take 100 mg by mouth daily.      . phenazopyridine (PYRIDIUM) 200 MG tablet Take 1 tablet (200 mg total) by mouth 3 (three) times daily as needed for pain.  6 tablet  0  . solifenacin (VESICARE) 5 MG tablet Take 5 mg by mouth daily.      . tamsulosin (FLOMAX) 0.4 MG CAPS Take 0.4 mg by mouth daily.      Marland Kitchen  zolpidem (AMBIEN) 10 MG tablet Take 10 mg by mouth at bedtime.       No current facility-administered medications for this visit.    Allergies:   No Known Allergies  Social History:  The patient  reports that he has never smoked. He does not have any smokeless tobacco history on file. He reports that he does not drink alcohol or use illicit drugs.   ROS:  Please see the history of present illness.   No blood in urine, stool, or hemoptysis. Denies transient neurological complaints. No claudication. He has not had any recurrence of syncope. He denies palpitations.   All other systems reviewed and negative.   OBJECTIVE: VS:  BP 128/66  Pulse 66  Ht 5\' 11"  (1.803 m)  Wt 214 lb (97.07 kg)  BMI 29.86 kg/m2 Well nourished, well developed, in no acute distress, stable HEENT: normal Neck: JVD flat. Carotid bruit absent  Cardiac:  normal S1, S2; RRR; no murmur Lungs:  clear to auscultation bilaterally, no wheezing, rhonchi or rales Abd: soft, nontender, no hepatomegaly Ext: Edema absent. Pulses 2+. Missing fingers right hand. Skin: warm and dry Neuro:  CNs 2-12 intact, no focal abnormalities noted  EKG:  Normal sinus  rhythm with poor R-wave progression and nonspecific T-wave abnormality anteriorly. Old inferior infarct. No changes compared to prior.       Signed, Illene Labrador III, MD 02/28/2013 9:57 AM  Medical History: Hypertension, CAD 2001, CABG x 5, Hyperlipidemia, Weight loss, Syncope, Cerebrovascular disease, Lumbar disc disease, CVA 1995, prostate cancer 2011 followed by Dr. Janice Norrie, Colon polyp, family history colon cancer - Daughter.       Medications: None

## 2013-02-28 NOTE — Patient Instructions (Signed)
Your physician recommends that you continue on your current medications as directed. Please refer to the Current Medication list given to you today.  Your physician wants you to follow-up in: 1 year You will receive a reminder letter in the mail two months in advance. If you don't receive a letter, please call our office to schedule the follow-up appointment.   Your physician discussed the importance of regular exercise and recommended that you start or continue a regular exercise program for good health.

## 2013-03-15 DIAGNOSIS — IMO0002 Reserved for concepts with insufficient information to code with codable children: Secondary | ICD-10-CM | POA: Diagnosis not present

## 2013-03-15 DIAGNOSIS — M5030 Other cervical disc degeneration, unspecified cervical region: Secondary | ICD-10-CM | POA: Diagnosis not present

## 2013-03-15 DIAGNOSIS — M1710 Unilateral primary osteoarthritis, unspecified knee: Secondary | ICD-10-CM | POA: Diagnosis not present

## 2013-04-17 DIAGNOSIS — C61 Malignant neoplasm of prostate: Secondary | ICD-10-CM | POA: Diagnosis not present

## 2013-04-20 DIAGNOSIS — H2510 Age-related nuclear cataract, unspecified eye: Secondary | ICD-10-CM | POA: Diagnosis not present

## 2013-04-21 DIAGNOSIS — N183 Chronic kidney disease, stage 3 unspecified: Secondary | ICD-10-CM | POA: Diagnosis not present

## 2013-04-21 DIAGNOSIS — F430 Acute stress reaction: Secondary | ICD-10-CM | POA: Diagnosis not present

## 2013-04-21 DIAGNOSIS — Z23 Encounter for immunization: Secondary | ICD-10-CM | POA: Diagnosis not present

## 2013-04-21 DIAGNOSIS — G479 Sleep disorder, unspecified: Secondary | ICD-10-CM | POA: Diagnosis not present

## 2013-04-21 DIAGNOSIS — I10 Essential (primary) hypertension: Secondary | ICD-10-CM | POA: Diagnosis not present

## 2013-04-21 DIAGNOSIS — K219 Gastro-esophageal reflux disease without esophagitis: Secondary | ICD-10-CM | POA: Diagnosis not present

## 2013-04-21 DIAGNOSIS — E782 Mixed hyperlipidemia: Secondary | ICD-10-CM | POA: Diagnosis not present

## 2013-04-21 DIAGNOSIS — M542 Cervicalgia: Secondary | ICD-10-CM | POA: Diagnosis not present

## 2013-04-21 DIAGNOSIS — J309 Allergic rhinitis, unspecified: Secondary | ICD-10-CM | POA: Diagnosis not present

## 2013-05-11 DIAGNOSIS — C44319 Basal cell carcinoma of skin of other parts of face: Secondary | ICD-10-CM | POA: Diagnosis not present

## 2013-05-11 DIAGNOSIS — Z85828 Personal history of other malignant neoplasm of skin: Secondary | ICD-10-CM | POA: Diagnosis not present

## 2013-05-11 DIAGNOSIS — D485 Neoplasm of uncertain behavior of skin: Secondary | ICD-10-CM | POA: Diagnosis not present

## 2013-05-19 DIAGNOSIS — M1710 Unilateral primary osteoarthritis, unspecified knee: Secondary | ICD-10-CM | POA: Diagnosis not present

## 2013-05-19 DIAGNOSIS — IMO0002 Reserved for concepts with insufficient information to code with codable children: Secondary | ICD-10-CM | POA: Diagnosis not present

## 2013-05-26 DIAGNOSIS — M1710 Unilateral primary osteoarthritis, unspecified knee: Secondary | ICD-10-CM | POA: Diagnosis not present

## 2013-06-02 DIAGNOSIS — M1710 Unilateral primary osteoarthritis, unspecified knee: Secondary | ICD-10-CM | POA: Diagnosis not present

## 2013-06-22 DIAGNOSIS — C44319 Basal cell carcinoma of skin of other parts of face: Secondary | ICD-10-CM | POA: Diagnosis not present

## 2013-08-22 DIAGNOSIS — M1710 Unilateral primary osteoarthritis, unspecified knee: Secondary | ICD-10-CM | POA: Diagnosis not present

## 2013-08-23 DIAGNOSIS — G479 Sleep disorder, unspecified: Secondary | ICD-10-CM | POA: Diagnosis not present

## 2013-08-23 DIAGNOSIS — N183 Chronic kidney disease, stage 3 unspecified: Secondary | ICD-10-CM | POA: Diagnosis not present

## 2013-08-23 DIAGNOSIS — I10 Essential (primary) hypertension: Secondary | ICD-10-CM | POA: Diagnosis not present

## 2013-08-23 DIAGNOSIS — J309 Allergic rhinitis, unspecified: Secondary | ICD-10-CM | POA: Diagnosis not present

## 2013-08-23 DIAGNOSIS — F430 Acute stress reaction: Secondary | ICD-10-CM | POA: Diagnosis not present

## 2013-08-23 DIAGNOSIS — R0989 Other specified symptoms and signs involving the circulatory and respiratory systems: Secondary | ICD-10-CM | POA: Diagnosis not present

## 2013-08-23 DIAGNOSIS — K219 Gastro-esophageal reflux disease without esophagitis: Secondary | ICD-10-CM | POA: Diagnosis not present

## 2013-08-23 DIAGNOSIS — E782 Mixed hyperlipidemia: Secondary | ICD-10-CM | POA: Diagnosis not present

## 2013-10-25 DIAGNOSIS — N318 Other neuromuscular dysfunction of bladder: Secondary | ICD-10-CM | POA: Diagnosis not present

## 2013-10-25 DIAGNOSIS — C61 Malignant neoplasm of prostate: Secondary | ICD-10-CM | POA: Diagnosis not present

## 2013-11-07 DIAGNOSIS — D485 Neoplasm of uncertain behavior of skin: Secondary | ICD-10-CM | POA: Diagnosis not present

## 2013-11-07 DIAGNOSIS — C44319 Basal cell carcinoma of skin of other parts of face: Secondary | ICD-10-CM | POA: Diagnosis not present

## 2013-11-07 DIAGNOSIS — Z85828 Personal history of other malignant neoplasm of skin: Secondary | ICD-10-CM | POA: Diagnosis not present

## 2013-11-07 DIAGNOSIS — L821 Other seborrheic keratosis: Secondary | ICD-10-CM | POA: Diagnosis not present

## 2013-11-23 DIAGNOSIS — R0989 Other specified symptoms and signs involving the circulatory and respiratory systems: Secondary | ICD-10-CM | POA: Diagnosis not present

## 2013-11-23 DIAGNOSIS — K219 Gastro-esophageal reflux disease without esophagitis: Secondary | ICD-10-CM | POA: Diagnosis not present

## 2013-11-23 DIAGNOSIS — N183 Chronic kidney disease, stage 3 (moderate): Secondary | ICD-10-CM | POA: Diagnosis not present

## 2013-11-23 DIAGNOSIS — J309 Allergic rhinitis, unspecified: Secondary | ICD-10-CM | POA: Diagnosis not present

## 2013-11-23 DIAGNOSIS — Z23 Encounter for immunization: Secondary | ICD-10-CM | POA: Diagnosis not present

## 2013-11-23 DIAGNOSIS — E782 Mixed hyperlipidemia: Secondary | ICD-10-CM | POA: Diagnosis not present

## 2013-11-23 DIAGNOSIS — G4700 Insomnia, unspecified: Secondary | ICD-10-CM | POA: Diagnosis not present

## 2013-11-23 DIAGNOSIS — Z1389 Encounter for screening for other disorder: Secondary | ICD-10-CM | POA: Diagnosis not present

## 2013-11-23 DIAGNOSIS — I129 Hypertensive chronic kidney disease with stage 1 through stage 4 chronic kidney disease, or unspecified chronic kidney disease: Secondary | ICD-10-CM | POA: Diagnosis not present

## 2013-11-23 DIAGNOSIS — F430 Acute stress reaction: Secondary | ICD-10-CM | POA: Diagnosis not present

## 2013-12-28 DIAGNOSIS — C44319 Basal cell carcinoma of skin of other parts of face: Secondary | ICD-10-CM | POA: Diagnosis not present

## 2013-12-29 DIAGNOSIS — M170 Bilateral primary osteoarthritis of knee: Secondary | ICD-10-CM | POA: Diagnosis not present

## 2014-01-03 DIAGNOSIS — M170 Bilateral primary osteoarthritis of knee: Secondary | ICD-10-CM | POA: Diagnosis not present

## 2014-01-10 DIAGNOSIS — M170 Bilateral primary osteoarthritis of knee: Secondary | ICD-10-CM | POA: Diagnosis not present

## 2014-02-07 ENCOUNTER — Ambulatory Visit (HOSPITAL_COMMUNITY): Payer: Medicare Other | Attending: Cardiology | Admitting: Cardiology

## 2014-02-07 DIAGNOSIS — I2510 Atherosclerotic heart disease of native coronary artery without angina pectoris: Secondary | ICD-10-CM | POA: Diagnosis not present

## 2014-02-07 DIAGNOSIS — Z951 Presence of aortocoronary bypass graft: Secondary | ICD-10-CM | POA: Insufficient documentation

## 2014-02-07 DIAGNOSIS — I6523 Occlusion and stenosis of bilateral carotid arteries: Secondary | ICD-10-CM

## 2014-02-07 DIAGNOSIS — I6529 Occlusion and stenosis of unspecified carotid artery: Secondary | ICD-10-CM

## 2014-02-07 DIAGNOSIS — I10 Essential (primary) hypertension: Secondary | ICD-10-CM | POA: Insufficient documentation

## 2014-02-07 DIAGNOSIS — Z8673 Personal history of transient ischemic attack (TIA), and cerebral infarction without residual deficits: Secondary | ICD-10-CM | POA: Insufficient documentation

## 2014-02-07 DIAGNOSIS — E785 Hyperlipidemia, unspecified: Secondary | ICD-10-CM | POA: Insufficient documentation

## 2014-02-07 NOTE — Progress Notes (Signed)
Carotid duplex performed 

## 2014-02-14 ENCOUNTER — Telehealth: Payer: Self-pay

## 2014-02-14 DIAGNOSIS — I6523 Occlusion and stenosis of bilateral carotid arteries: Secondary | ICD-10-CM

## 2014-02-14 NOTE — Telephone Encounter (Signed)
-----   Message from [place], MD sent at 02/09/2014  5:52 PM EST ----- Stable blockage when compared to prior. Repeat in 1 year

## 2014-02-14 NOTE — Telephone Encounter (Signed)
Pt aware of carotid results.Stable blockage when compared to prior. Repeat in 1 year.pt verbalized understanding.

## 2014-02-23 DIAGNOSIS — C61 Malignant neoplasm of prostate: Secondary | ICD-10-CM | POA: Diagnosis not present

## 2014-02-23 DIAGNOSIS — R0989 Other specified symptoms and signs involving the circulatory and respiratory systems: Secondary | ICD-10-CM | POA: Diagnosis not present

## 2014-02-23 DIAGNOSIS — G4700 Insomnia, unspecified: Secondary | ICD-10-CM | POA: Diagnosis not present

## 2014-02-23 DIAGNOSIS — E782 Mixed hyperlipidemia: Secondary | ICD-10-CM | POA: Diagnosis not present

## 2014-02-23 DIAGNOSIS — I129 Hypertensive chronic kidney disease with stage 1 through stage 4 chronic kidney disease, or unspecified chronic kidney disease: Secondary | ICD-10-CM | POA: Diagnosis not present

## 2014-02-23 DIAGNOSIS — N400 Enlarged prostate without lower urinary tract symptoms: Secondary | ICD-10-CM | POA: Diagnosis not present

## 2014-02-23 DIAGNOSIS — F430 Acute stress reaction: Secondary | ICD-10-CM | POA: Diagnosis not present

## 2014-02-23 DIAGNOSIS — K219 Gastro-esophageal reflux disease without esophagitis: Secondary | ICD-10-CM | POA: Diagnosis not present

## 2014-02-23 DIAGNOSIS — J309 Allergic rhinitis, unspecified: Secondary | ICD-10-CM | POA: Diagnosis not present

## 2014-02-23 DIAGNOSIS — N183 Chronic kidney disease, stage 3 (moderate): Secondary | ICD-10-CM | POA: Diagnosis not present

## 2014-03-07 ENCOUNTER — Encounter: Payer: Self-pay | Admitting: Interventional Cardiology

## 2014-03-07 ENCOUNTER — Ambulatory Visit (INDEPENDENT_AMBULATORY_CARE_PROVIDER_SITE_OTHER): Payer: Medicare Other | Admitting: Interventional Cardiology

## 2014-03-07 VITALS — BP 138/64 | HR 72 | Ht 71.0 in | Wt 219.0 lb

## 2014-03-07 DIAGNOSIS — I739 Peripheral vascular disease, unspecified: Secondary | ICD-10-CM

## 2014-03-07 DIAGNOSIS — I6523 Occlusion and stenosis of bilateral carotid arteries: Secondary | ICD-10-CM | POA: Diagnosis not present

## 2014-03-07 DIAGNOSIS — E785 Hyperlipidemia, unspecified: Secondary | ICD-10-CM | POA: Diagnosis not present

## 2014-03-07 DIAGNOSIS — I779 Disorder of arteries and arterioles, unspecified: Secondary | ICD-10-CM | POA: Diagnosis not present

## 2014-03-07 DIAGNOSIS — I25810 Atherosclerosis of coronary artery bypass graft(s) without angina pectoris: Secondary | ICD-10-CM

## 2014-03-07 DIAGNOSIS — I10 Essential (primary) hypertension: Secondary | ICD-10-CM | POA: Diagnosis not present

## 2014-03-07 NOTE — Patient Instructions (Signed)
Your physician recommends that you continue on your current medications as directed. Please refer to the Current Medication list given to you today.  Your physician wants you to follow-up in: 1 year with Dr.Smith You will receive a reminder letter in the mail two months in advance. If you don't receive a letter, please call our office to schedule the follow-up appointment.  

## 2014-03-07 NOTE — Progress Notes (Signed)
Cardiology Office Note   Date:  03/07/2014   ID:  XZAVIA FEINSTEIN, DOB 01/22/1929, MRN [id number]  PCP:  Gara Kroner, MD  Cardiologist:  Sinclair Grooms, MD   CABG and hypertension    History of Present Illness: Brandon Harris is a 81 y.o. male who presents for CAD and hypertension. He has no complaints. Bypass surgery was 15 years ago. He does not use nitroglycerin. Has claudication. There is no orthopnea or lower extremity edema. He does not use nitroglycerin.    Past Medical History  Diagnosis Date  . Prostate cancer   . Hypertension   . Stroke   . Status post coronary artery bypass grafting      intraoperative cholangiogram  . Gallstones   . Diverticulosis     colonic diverticulosis    Past Surgical History  Procedure Laterality Date  . Prostate cancer    . Coronary artery bypass graft    . Back surgery    . Rotator cuff repair      LEFT  . Cryotherapy      Prostate Cancer  . Cholecystectomy, laparoscopic       Current Outpatient Prescriptions  Medication Sig Dispense Refill  . ALPRAZolam (XANAX) 0.25 MG tablet TAKE ONE TABLET AT NIGHT    . amLODipine (NORVASC) 10 MG tablet Take 10 mg by mouth daily.    Marland Kitchen aspirin 81 MG tablet Take 81 mg by mouth daily.    Marland Kitchen atorvastatin (LIPITOR) 80 MG tablet Take 80 mg by mouth daily.    . cholecalciferol (VITAMIN D) 1000 UNITS tablet Take 1,000 Units by mouth daily.    Marland Kitchen losartan (COZAAR) 100 MG tablet Take 100 mg by mouth daily.    Marland Kitchen omeprazole (PRILOSEC) 20 MG capsule Take 20 mg by mouth daily.    . solifenacin (VESICARE) 5 MG tablet Take 5 mg by mouth daily.    . tamsulosin (FLOMAX) 0.4 MG CAPS Take 0.4 mg by mouth daily.    Marland Kitchen zolpidem (AMBIEN) 10 MG tablet Take 10 mg by mouth at bedtime.     No current facility-administered medications for this visit.    Allergies:   Review of patient's allergies indicates no known allergies.    Social History:  The patient  reports that he has never smoked. He does  not have any smokeless tobacco history on file. He reports that he does not drink alcohol or use illicit drugs.   Family History:  The patient's family history includes Heart failure in his mother.    ROS:  Please see the history of present illness.   Otherwise, review of systems are positive for dyspnea on exertion..   All other systems are reviewed and negative.    PHYSICAL EXAM: VS:  BP 138/64 mmHg  Pulse 72  Ht 5\' 11"  (1.803 m)  Wt 219 lb (99.338 kg)  BMI 30.56 kg/m2 , BMI Body mass index is 30.56 kg/(m^2). GEN: Well nourished, well developed, in no acute distress HEENT: normal Neck: no JVD, carotid bruits, or masses Cardiac: Irregular RR; no murmurs, rubs, or gallops,no edema  Respiratory:  clear to auscultation bilaterally, normal work of breathing GI: soft, nontender, nondistended, + BS MS: no deformity or atrophy Skin: warm and dry, no rash Neuro:  Strength and sensation are intact Psych: euthymic mood, full affect   EKG:  EKG is ordered today. The ekg ordered today demonstrates normal sinus rhythm with PACs and ST T-wave abnormality. T-wave flattening is  new since the prior tracing   Recent Labs: No results found for requested labs within last 365 days.    Lipid Panel No results found for: CHOL, TRIG, HDL, CHOLHDL, VLDL, LDLCALC, LDLDIRECT    Wt Readings from Last 3 Encounters:  03/07/14 219 lb (99.338 kg)  02/28/13 214 lb (97.07 kg)      Other studies Reviewed: Additional studies/ records that were reviewed today include: None.   ASSESSMENT AND PLAN:  1.  Coronary atherosclerosis with bypass surgery 2001. Asymptomatic with reference to angina and heart failure 2. Essential hypertension currently stable on medical regimen as noted above 3. Hyperlipidemia managed by primary care 4. Bilateral carotid disease, being followed yearly with Doppler evaluation   Current medicines are reviewed at length with the patient today.  The patient does not have  concerns regarding medicines.  The following changes have been made:  Add aspirin to the medication list  Labs/ tests ordered today include:  Orders Placed This Encounter  Procedures  . EKG 12-Lead     Disposition:   FU with Linard Millers in 1 Year   Signed, Sinclair Grooms, MD  03/07/2014 11:02 AM    [facility] [place], [place], [place]  [address] Phone: [phone]; Fax: [phone]

## 2014-03-08 ENCOUNTER — Emergency Department (HOSPITAL_COMMUNITY)
Admission: EM | Admit: 2014-03-08 | Discharge: 2014-03-08 | Disposition: A | Discharge status: Home / Self Care | Payer: Medicare Other | Attending: Emergency Medicine | Admitting: Emergency Medicine

## 2014-03-08 ENCOUNTER — Encounter (HOSPITAL_COMMUNITY): Payer: Self-pay

## 2014-03-08 ENCOUNTER — Emergency Department (HOSPITAL_COMMUNITY): Payer: Medicare Other

## 2014-03-08 DIAGNOSIS — M25552 Pain in left hip: Secondary | ICD-10-CM | POA: Diagnosis not present

## 2014-03-08 DIAGNOSIS — Z79899 Other long term (current) drug therapy: Secondary | ICD-10-CM | POA: Diagnosis not present

## 2014-03-08 DIAGNOSIS — Z7982 Long term (current) use of aspirin: Secondary | ICD-10-CM | POA: Diagnosis not present

## 2014-03-08 DIAGNOSIS — Z8719 Personal history of other diseases of the digestive system: Secondary | ICD-10-CM | POA: Diagnosis not present

## 2014-03-08 DIAGNOSIS — Z8673 Personal history of transient ischemic attack (TIA), and cerebral infarction without residual deficits: Secondary | ICD-10-CM | POA: Insufficient documentation

## 2014-03-08 DIAGNOSIS — X58XXXA Exposure to other specified factors, initial encounter: Secondary | ICD-10-CM | POA: Diagnosis not present

## 2014-03-08 DIAGNOSIS — Z8546 Personal history of malignant neoplasm of prostate: Secondary | ICD-10-CM | POA: Insufficient documentation

## 2014-03-08 DIAGNOSIS — Y998 Other external cause status: Secondary | ICD-10-CM | POA: Insufficient documentation

## 2014-03-08 DIAGNOSIS — Y9389 Activity, other specified: Secondary | ICD-10-CM | POA: Insufficient documentation

## 2014-03-08 DIAGNOSIS — Y9289 Other specified places as the place of occurrence of the external cause: Secondary | ICD-10-CM | POA: Insufficient documentation

## 2014-03-08 DIAGNOSIS — S76012A Strain of muscle, fascia and tendon of left hip, initial encounter: Secondary | ICD-10-CM | POA: Diagnosis not present

## 2014-03-08 DIAGNOSIS — S79912A Unspecified injury of left hip, initial encounter: Secondary | ICD-10-CM | POA: Diagnosis present

## 2014-03-08 DIAGNOSIS — R52 Pain, unspecified: Secondary | ICD-10-CM

## 2014-03-08 DIAGNOSIS — I10 Essential (primary) hypertension: Secondary | ICD-10-CM | POA: Insufficient documentation

## 2014-03-08 DIAGNOSIS — Z951 Presence of aortocoronary bypass graft: Secondary | ICD-10-CM | POA: Insufficient documentation

## 2014-03-08 MED ORDER — METHOCARBAMOL 500 MG PO TABS: 500.0000 mg | ORAL_TABLET | Freq: Two times a day (BID) | ORAL | Status: DC | PRN

## 2014-03-08 MED ORDER — NAPROXEN 500 MG PO TABS: 500.0000 mg | ORAL_TABLET | Freq: Two times a day (BID) | ORAL | Status: DC

## 2014-03-08 MED ORDER — NAPROXEN 500 MG PO TABS
500.0000 mg | ORAL_TABLET | Freq: Once | ORAL | Status: AC
  Administered 2014-03-08: 500 mg via ORAL
  Filled 2014-03-08: qty 1

## 2014-03-08 NOTE — ED Notes (Signed)
Patient transported to X-ray 

## 2014-03-08 NOTE — ED Notes (Signed)
PA at bedside.

## 2014-03-08 NOTE — ED Notes (Signed)
Per pt, carrying in wood last night.  Felt sharp pain in left hip.   Did not fall but almost.  No trauma or history with hip.  This morning, difficult to walk.

## 2014-03-08 NOTE — ED Provider Notes (Signed)
CSN: [id number]     Arrival date & time 03/08/14  0907 History   First MD Initiated Contact with Patient 03/08/14 0914     Chief Complaint  Patient presents with  . Hip Pain     (Consider location/radiation/quality/duration/timing/severity/associated sxs/prior Treatment) HPI Comments: The patient is an 81 year old male, states that while he was carrying a 5 gallon bucket of wood last night up 3 steps he developed acute onset of pain in his left lateral hip and buttock which has been persistent but definitely worse with ambulation and improved with rest. No signs of leg length discrepancies or deformities, minimal pain with range of motion at rest, denies prior orthopedic hip problems, does have a history of prostate surgery, unknown if he has metastatic disease.  Patient is a 81 y.o. male presenting with hip pain. The history is provided by the patient.  Hip Pain    Past Medical History  Diagnosis Date  . Prostate cancer   . Hypertension   . Stroke   . Status post coronary artery bypass grafting      intraoperative cholangiogram  . Gallstones   . Diverticulosis     colonic diverticulosis   Past Surgical History  Procedure Laterality Date  . Prostate cancer    . Coronary artery bypass graft    . Back surgery    . Rotator cuff repair      LEFT  . Cryotherapy      Prostate Cancer  . Cholecystectomy, laparoscopic     Family History  Problem Relation Age of Onset  . Heart failure Mother    History  Substance Use Topics  . Smoking status: Never Smoker   . Smokeless tobacco: Not on file  . Alcohol Use: No    Review of Systems  All other systems reviewed and are negative.     Allergies  Review of patient's allergies indicates no known allergies.  Home Medications   Prior to Admission medications   Medication Sig Start Date End Date Taking? Authorizing Provider  ALPRAZolam Duanne Moron) 0.25 MG tablet TAKE ONE TABLET AT NIGHT 02/13/13   Historical Provider, MD   amLODipine (NORVASC) 10 MG tablet Take 10 mg by mouth daily.    Historical Provider, MD  aspirin 81 MG tablet Take 81 mg by mouth daily.    Historical Provider, MD  atorvastatin (LIPITOR) 80 MG tablet Take 80 mg by mouth daily.    Historical Provider, MD  cholecalciferol (VITAMIN D) 1000 UNITS tablet Take 1,000 Units by mouth daily.    Historical Provider, MD  losartan (COZAAR) 100 MG tablet Take 100 mg by mouth daily.    Historical Provider, MD  methocarbamol (ROBAXIN) 500 MG tablet Take 1 tablet (500 mg total) by mouth 2 (two) times daily as needed for muscle spasms. 03/08/14   Johnna Acosta, MD  naproxen (NAPROSYN) 500 MG tablet Take 1 tablet (500 mg total) by mouth 2 (two) times daily with a meal. 03/08/14   Johnna Acosta, MD  omeprazole (PRILOSEC) 20 MG capsule Take 20 mg by mouth daily.    Historical Provider, MD  solifenacin (VESICARE) 5 MG tablet Take 5 mg by mouth daily.    Historical Provider, MD  tamsulosin (FLOMAX) 0.4 MG CAPS Take 0.4 mg by mouth daily.    Historical Provider, MD  zolpidem (AMBIEN) 10 MG tablet Take 10 mg by mouth at bedtime.    Historical Provider, MD   BP 133/59 mmHg  Pulse 70  Temp(Src) 97.6 F (36.4  C) (Oral)  Resp 18  SpO2 98% Physical Exam  Constitutional: He appears well-developed and well-nourished. No distress.  HENT:  Head: Normocephalic and atraumatic.  Eyes: Conjunctivae are normal. No scleral icterus.  Cardiovascular: Normal rate, regular rhythm and intact distal pulses.   Pulmonary/Chest: Effort normal and breath sounds normal.  Musculoskeletal: He exhibits tenderness ( Tenderness to palpation in the left lateral gluteus muscle posterior to the acetabulum, range of motion of the left hip in all directions, able to straight leg raise without difficulty). He exhibits no edema.  No leg shortening  Neurological: He is alert.  Skin: Skin is warm and dry. No rash noted. He is not diaphoretic.  Nursing note and vitals reviewed.   ED Course   Procedures (including critical care time) Labs Review Labs Reviewed - No data to display  Imaging Review Dg Hip Unilat With Pelvis 2-3 Views Left  03/08/2014   CLINICAL DATA:  Acute left hip pain after carrying firewood last night. Initial encounter.  EXAM: DG HIP W/ PELVIS 2-3V*L*  COMPARISON:  None.  FINDINGS: No fracture or dislocation is noted. No significant degenerative changes noted. Postsurgical changes are seen involving lower lumbar spine.  IMPRESSION: Normal left hip.   Electronically Signed   By: Sabino Dick M.D.   On: 03/08/2014 10:49      MDM   Final diagnoses:  Pain  Strain of left hip, initial encounter    Image the hip, evaluate for fracture or metastatic disease, likely musculoskeletal in origin, no neurologic symptoms of concern.  Imaging negative for joint fracture, pelvic fracture or femur abnormalities, likely musculoskeletal, patient can be discharged home.  Johnna Acosta, MD 03/08/14 [phone]

## 2014-03-08 NOTE — Discharge Instructions (Signed)
Please call your doctor for a followup appointment within 24-48 hours. When you talk to your doctor please let them know that you were seen in the emergency department and have them acquire all of your records so that they can discuss the findings with you and formulate a treatment plan to fully care for your new and ongoing problems. ° °

## 2014-03-09 ENCOUNTER — Ambulatory Visit: Payer: PRIVATE HEALTH INSURANCE | Admitting: Interventional Cardiology

## 2014-04-16 DIAGNOSIS — M170 Bilateral primary osteoarthritis of knee: Secondary | ICD-10-CM | POA: Diagnosis not present

## 2014-04-16 DIAGNOSIS — M1711 Unilateral primary osteoarthritis, right knee: Secondary | ICD-10-CM | POA: Diagnosis not present

## 2014-05-09 DIAGNOSIS — L570 Actinic keratosis: Secondary | ICD-10-CM | POA: Diagnosis not present

## 2014-05-09 DIAGNOSIS — Z85828 Personal history of other malignant neoplasm of skin: Secondary | ICD-10-CM | POA: Diagnosis not present

## 2014-05-09 DIAGNOSIS — L821 Other seborrheic keratosis: Secondary | ICD-10-CM | POA: Diagnosis not present

## 2014-05-29 DIAGNOSIS — C61 Malignant neoplasm of prostate: Secondary | ICD-10-CM | POA: Diagnosis not present

## 2014-06-03 ENCOUNTER — Emergency Department (HOSPITAL_BASED_OUTPATIENT_CLINIC_OR_DEPARTMENT_OTHER)
Admission: EM | Admit: 2014-06-03 | Discharge: 2014-06-03 | Disposition: A | Discharge status: Home / Self Care | Payer: Medicare Other | Attending: Emergency Medicine | Admitting: Emergency Medicine

## 2014-06-03 ENCOUNTER — Encounter (HOSPITAL_BASED_OUTPATIENT_CLINIC_OR_DEPARTMENT_OTHER): Payer: Self-pay | Admitting: *Deleted

## 2014-06-03 DIAGNOSIS — Z8546 Personal history of malignant neoplasm of prostate: Secondary | ICD-10-CM | POA: Insufficient documentation

## 2014-06-03 DIAGNOSIS — Z8673 Personal history of transient ischemic attack (TIA), and cerebral infarction without residual deficits: Secondary | ICD-10-CM | POA: Insufficient documentation

## 2014-06-03 DIAGNOSIS — N390 Urinary tract infection, site not specified: Secondary | ICD-10-CM | POA: Diagnosis not present

## 2014-06-03 DIAGNOSIS — Z79899 Other long term (current) drug therapy: Secondary | ICD-10-CM | POA: Insufficient documentation

## 2014-06-03 DIAGNOSIS — Z951 Presence of aortocoronary bypass graft: Secondary | ICD-10-CM | POA: Insufficient documentation

## 2014-06-03 DIAGNOSIS — I10 Essential (primary) hypertension: Secondary | ICD-10-CM | POA: Insufficient documentation

## 2014-06-03 DIAGNOSIS — Z791 Long term (current) use of non-steroidal anti-inflammatories (NSAID): Secondary | ICD-10-CM | POA: Diagnosis not present

## 2014-06-03 DIAGNOSIS — Z8719 Personal history of other diseases of the digestive system: Secondary | ICD-10-CM | POA: Insufficient documentation

## 2014-06-03 DIAGNOSIS — R350 Frequency of micturition: Secondary | ICD-10-CM | POA: Diagnosis present

## 2014-06-03 DIAGNOSIS — Z7982 Long term (current) use of aspirin: Secondary | ICD-10-CM | POA: Insufficient documentation

## 2014-06-03 LAB — URINALYSIS, ROUTINE W REFLEX MICROSCOPIC
Bilirubin Urine: NEGATIVE
Glucose, UA: NEGATIVE mg/dL
Ketones, ur: NEGATIVE mg/dL
Nitrite: POSITIVE — AB
Protein, ur: 30 mg/dL — AB
Specific Gravity, Urine: 1.01 (ref 1.005–1.030)
Urobilinogen, UA: 1 mg/dL (ref 0.0–1.0)
pH: 5.5 (ref 5.0–8.0)

## 2014-06-03 LAB — URINE MICROSCOPIC-ADD ON

## 2014-06-03 MED ORDER — CIPROFLOXACIN HCL 500 MG PO TABS: 500.0000 mg | ORAL_TABLET | Freq: Two times a day (BID) | ORAL | Status: DC

## 2014-06-03 NOTE — ED Provider Notes (Signed)
CSN: [id number]     Arrival date & time 06/03/14  1438 History  This chart was scribed for Brandon Dessert, MD by Stephania Fragmin, ED Scribe. This patient was seen in room MHT14/MHT14 and the patient's care was started at 3:25 PM.   Chief Complaint  Patient presents with  . Urinary Frequency   HPI  HPI Comments: Brandon Harris is a 81 y.o. male who presents to the Emergency Department complaining of urinary frequency, as he states he has to urinate every 15 minutes, that began last night. He states he had a similar problem 3-4 years ago, although adds that he was seen in the ED for a UTI about 2 years ago (see chart review). Patient endorses urinary retention and occasional burning dysuria. He notes a history of prostate cancer which was treated with cryotherapy. He denies fever, abdominal pain, or back pain. He has NKDA to antibiotics.  Past Medical History  Diagnosis Date  . Prostate cancer   . Hypertension   . Stroke   . Status post coronary artery bypass grafting      intraoperative cholangiogram  . Gallstones   . Diverticulosis     colonic diverticulosis   Past Surgical History  Procedure Laterality Date  . Prostate cancer    . Coronary artery bypass graft    . Back surgery    . Rotator cuff repair      LEFT  . Cryotherapy      Prostate Cancer  . Cholecystectomy, laparoscopic     Family History  Problem Relation Age of Onset  . Heart failure Mother    History  Substance Use Topics  . Smoking status: Never Smoker   . Smokeless tobacco: Not on file  . Alcohol Use: No    Review of Systems  A complete 10 system review of systems was obtained and all systems are negative except as noted in the HPI and PMH.    Allergies  Review of patient's allergies indicates no known allergies.  Home Medications   Prior to Admission medications   Medication Sig Start Date End Date Taking? Authorizing Provider  ALPRAZolam Duanne Moron) 0.25 MG tablet TAKE ONE TABLET AT NIGHT 02/13/13    Historical Provider, MD  amLODipine (NORVASC) 10 MG tablet Take 10 mg by mouth daily.    Historical Provider, MD  aspirin 81 MG tablet Take 81 mg by mouth daily.    Historical Provider, MD  atorvastatin (LIPITOR) 80 MG tablet Take 80 mg by mouth daily.    Historical Provider, MD  cholecalciferol (VITAMIN D) 1000 UNITS tablet Take 1,000 Units by mouth daily.    Historical Provider, MD  losartan (COZAAR) 100 MG tablet Take 100 mg by mouth daily.    Historical Provider, MD  methocarbamol (ROBAXIN) 500 MG tablet Take 1 tablet (500 mg total) by mouth 2 (two) times daily as needed for muscle spasms. 03/08/14   Noemi Chapel, MD  naproxen (NAPROSYN) 500 MG tablet Take 1 tablet (500 mg total) by mouth 2 (two) times daily with a meal. 03/08/14   Noemi Chapel, MD  omeprazole (PRILOSEC) 20 MG capsule Take 20 mg by mouth daily.    Historical Provider, MD  solifenacin (VESICARE) 5 MG tablet Take 5 mg by mouth daily.    Historical Provider, MD  tamsulosin (FLOMAX) 0.4 MG CAPS Take 0.4 mg by mouth daily.    Historical Provider, MD  zolpidem (AMBIEN) 10 MG tablet Take 10 mg by mouth at bedtime.    Historical Provider,  MD   BP 148/68 mmHg  Pulse 82  Temp(Src) 97.7 F (36.5 C) (Oral)  Resp 18  Ht 6' (1.829 m)  Wt 218 lb (98.884 kg)  BMI 29.56 kg/m2  SpO2 100% Physical Exam  Constitutional: He is oriented to person, place, and time. He appears well-developed and well-nourished. No distress.  HENT:  Head: Normocephalic and atraumatic.  Eyes: Conjunctivae and EOM are normal.  Neck: Neck supple. No tracheal deviation present.  Cardiovascular: Normal rate.   Pulmonary/Chest: Effort normal. No respiratory distress.  Abdominal: There is no tenderness.  No CVA or suprapubic tenderness.  Musculoskeletal: Normal range of motion.  Neurological: He is alert and oriented to person, place, and time.  Skin: Skin is warm and dry.  Psychiatric: He has a normal mood and affect. His behavior is normal.  Nursing note and  vitals reviewed.   ED Course  Procedures (including critical care time)  DIAGNOSTIC STUDIES: Oxygen Saturation is 100% on room air, normal by my interpretation.    COORDINATION OF CARE: 3:27 PM - Discussed treatment plan with pt at bedside which includes Rx antibiotics, and pt agreed to plan.   Labs Review Labs Reviewed  URINALYSIS, ROUTINE W REFLEX MICROSCOPIC - Abnormal; Notable for the following:    APPearance TURBID (*)    Hgb urine dipstick LARGE (*)    Protein, ur 30 (*)    Nitrite POSITIVE (*)    Leukocytes, UA LARGE (*)    All other components within normal limits  URINE MICROSCOPIC-ADD ON - Abnormal; Notable for the following:    Bacteria, UA FEW (*)    All other components within normal limits    MDM   Final diagnoses:  UTI (lower urinary tract infection)    Pt here with classic sx of UTI. No hx concerning for pyelonephritis.  O/w well appearing.  No sx concerning for retention at this time.  Pt started on cipro and d/ced home.   I personally performed the services described in this documentation, which was scribed in my presence.  The recorded information has been reviewed and considered.     Brandon Dessert, MD 06/03/14 [phone]

## 2014-06-03 NOTE — ED Notes (Signed)
Pt here for urinary urgency and frequency and burning with urination.  No abdominal or flank pain.  Urine sent to lab

## 2014-06-06 DIAGNOSIS — C61 Malignant neoplasm of prostate: Secondary | ICD-10-CM | POA: Diagnosis not present

## 2014-06-06 DIAGNOSIS — N3281 Overactive bladder: Secondary | ICD-10-CM | POA: Diagnosis not present

## 2014-06-06 LAB — URINE CULTURE: Colony Count: >=100000

## 2014-06-07 ENCOUNTER — Telehealth (HOSPITAL_BASED_OUTPATIENT_CLINIC_OR_DEPARTMENT_OTHER): Payer: Self-pay | Admitting: Emergency Medicine

## 2014-06-07 NOTE — Telephone Encounter (Signed)
Post ED Visit - Positive Culture Follow-up  Culture report reviewed by antimicrobial stewardship pharmacist: []  Wes Dulaney, Pharm.D., BCPS []  Heide Guile, Pharm.D., BCPS []  Alycia Rossetti, Pharm.D., BCPS []  [place], Pharm.D., BCPS, AAHIVP [x]  Legrand Como, Pharm.D., BCPS, AAHIVP []  Isac Sarna, Pharm.D., BCPS  Positive urine culture E. Coli Treated with ciprofloxacin, organism sensitive to the same and no further patient follow-up is required at this time.  Hazle Nordmann 06/07/2014, 12:58 PM

## 2014-07-12 DIAGNOSIS — C44629 Squamous cell carcinoma of skin of left upper limb, including shoulder: Secondary | ICD-10-CM | POA: Diagnosis not present

## 2014-07-12 DIAGNOSIS — D485 Neoplasm of uncertain behavior of skin: Secondary | ICD-10-CM | POA: Diagnosis not present

## 2014-07-19 DIAGNOSIS — H25013 Cortical age-related cataract, bilateral: Secondary | ICD-10-CM | POA: Diagnosis not present

## 2014-07-19 DIAGNOSIS — H2513 Age-related nuclear cataract, bilateral: Secondary | ICD-10-CM | POA: Diagnosis not present

## 2014-07-27 DIAGNOSIS — C44629 Squamous cell carcinoma of skin of left upper limb, including shoulder: Secondary | ICD-10-CM | POA: Diagnosis not present

## 2014-08-16 DIAGNOSIS — M170 Bilateral primary osteoarthritis of knee: Secondary | ICD-10-CM | POA: Diagnosis not present

## 2014-08-22 DIAGNOSIS — M170 Bilateral primary osteoarthritis of knee: Secondary | ICD-10-CM | POA: Diagnosis not present

## 2014-08-28 DIAGNOSIS — H25013 Cortical age-related cataract, bilateral: Secondary | ICD-10-CM | POA: Diagnosis not present

## 2014-08-29 DIAGNOSIS — M170 Bilateral primary osteoarthritis of knee: Secondary | ICD-10-CM | POA: Diagnosis not present

## 2014-09-13 DIAGNOSIS — H2511 Age-related nuclear cataract, right eye: Secondary | ICD-10-CM | POA: Diagnosis not present

## 2014-09-13 DIAGNOSIS — H25811 Combined forms of age-related cataract, right eye: Secondary | ICD-10-CM | POA: Diagnosis not present

## 2014-10-11 DIAGNOSIS — H2512 Age-related nuclear cataract, left eye: Secondary | ICD-10-CM | POA: Diagnosis not present

## 2014-10-11 DIAGNOSIS — H25812 Combined forms of age-related cataract, left eye: Secondary | ICD-10-CM | POA: Diagnosis not present

## 2014-10-16 DIAGNOSIS — R0989 Other specified symptoms and signs involving the circulatory and respiratory systems: Secondary | ICD-10-CM | POA: Diagnosis not present

## 2014-10-16 DIAGNOSIS — I129 Hypertensive chronic kidney disease with stage 1 through stage 4 chronic kidney disease, or unspecified chronic kidney disease: Secondary | ICD-10-CM | POA: Diagnosis not present

## 2014-10-16 DIAGNOSIS — C61 Malignant neoplasm of prostate: Secondary | ICD-10-CM | POA: Diagnosis not present

## 2014-10-16 DIAGNOSIS — G4700 Insomnia, unspecified: Secondary | ICD-10-CM | POA: Diagnosis not present

## 2014-10-16 DIAGNOSIS — N183 Chronic kidney disease, stage 3 (moderate): Secondary | ICD-10-CM | POA: Diagnosis not present

## 2014-10-16 DIAGNOSIS — E782 Mixed hyperlipidemia: Secondary | ICD-10-CM | POA: Diagnosis not present

## 2014-10-16 DIAGNOSIS — F430 Acute stress reaction: Secondary | ICD-10-CM | POA: Diagnosis not present

## 2014-10-16 DIAGNOSIS — J309 Allergic rhinitis, unspecified: Secondary | ICD-10-CM | POA: Diagnosis not present

## 2014-10-16 DIAGNOSIS — K219 Gastro-esophageal reflux disease without esophagitis: Secondary | ICD-10-CM | POA: Diagnosis not present

## 2014-10-16 DIAGNOSIS — N400 Enlarged prostate without lower urinary tract symptoms: Secondary | ICD-10-CM | POA: Diagnosis not present

## 2014-10-16 DIAGNOSIS — Z1389 Encounter for screening for other disorder: Secondary | ICD-10-CM | POA: Diagnosis not present

## 2014-10-16 DIAGNOSIS — Z23 Encounter for immunization: Secondary | ICD-10-CM | POA: Diagnosis not present

## 2014-10-17 DIAGNOSIS — M170 Bilateral primary osteoarthritis of knee: Secondary | ICD-10-CM | POA: Diagnosis not present

## 2014-11-02 DIAGNOSIS — M170 Bilateral primary osteoarthritis of knee: Secondary | ICD-10-CM | POA: Diagnosis not present

## 2014-11-08 DIAGNOSIS — Z85828 Personal history of other malignant neoplasm of skin: Secondary | ICD-10-CM | POA: Diagnosis not present

## 2014-11-08 DIAGNOSIS — L570 Actinic keratosis: Secondary | ICD-10-CM | POA: Diagnosis not present

## 2014-11-08 DIAGNOSIS — D1801 Hemangioma of skin and subcutaneous tissue: Secondary | ICD-10-CM | POA: Diagnosis not present

## 2014-11-08 DIAGNOSIS — L821 Other seborrheic keratosis: Secondary | ICD-10-CM | POA: Diagnosis not present

## 2014-11-09 ENCOUNTER — Telehealth: Payer: Self-pay

## 2014-11-09 DIAGNOSIS — H35371 Puckering of macula, right eye: Secondary | ICD-10-CM | POA: Diagnosis not present

## 2014-11-09 DIAGNOSIS — H35351 Cystoid macular degeneration, right eye: Secondary | ICD-10-CM | POA: Diagnosis not present

## 2014-11-09 NOTE — Telephone Encounter (Signed)
Cardiac clearance place in MR nurse fax box to be faxed to [facility]

## 2014-11-20 DIAGNOSIS — M1711 Unilateral primary osteoarthritis, right knee: Secondary | ICD-10-CM | POA: Diagnosis not present

## 2014-11-23 DIAGNOSIS — H35371 Puckering of macula, right eye: Secondary | ICD-10-CM | POA: Diagnosis not present

## 2014-11-26 DIAGNOSIS — C61 Malignant neoplasm of prostate: Secondary | ICD-10-CM | POA: Diagnosis not present

## 2014-12-03 DIAGNOSIS — N3281 Overactive bladder: Secondary | ICD-10-CM | POA: Diagnosis not present

## 2014-12-05 NOTE — H&P (Signed)
TOTAL KNEE ADMISSION H&P  Patient is being admitted for right total knee arthroplasty, anterior approach.  Subjective:  Chief Complaint:     Right knee primary OA / pain  HPI: Brandon Harris, 81 y.o. male, has a history of pain and functional disability in the right knee due to arthritis and has failed non-surgical conservative treatments for greater than 12 weeks to include  NSAID's and/or analgesics, corticosteriod injections, viscosupplementation injections and activity modification.  Onset of symptoms was gradual, starting 3-4 years ago with gradually worsening course since that time. The patient noted no past surgery on the right knee(s).  Patient currently rates pain in the right knee(s) at 10 out of 10 with activity. Patient has night pain, worsening of pain with activity and weight bearing, pain that interferes with activities of daily living, pain with passive range of motion, crepitus and joint swelling.  Patient has evidence of periarticular osteophytes and joint space narrowing by imaging studies.  There is no active infection.  Risks, benefits and expectations were discussed with the patient.  Risks including but not limited to the risk of anesthesia, blood clots, nerve damage, blood vessel damage, failure of the prosthesis, infection and up to and including death.  Patient understand the risks, benefits and expectations and wishes to proceed with surgery.   PCP: Gara Kroner, MD  D/C Plans:      Home with HHPT  Post-op Meds:       No Rx given   Tranexamic Acid:      To be given - topically  (CAD, previous MI and previous CVA)  Decadron:      Is to be given  FYI:     ASA post-op  Norco post-op  Xanax qHS for sleep     Patient Active Problem List   Diagnosis Date Noted  . Carotid disease, bilateral ([place]) 02/28/2013  . CAD (coronary artery disease) of artery bypass graft 02/28/2013  . Essential hypertension 02/28/2013  . Hyperlipidemia 02/28/2013   Past Medical History   Diagnosis Date  . Prostate cancer   . Hypertension   . Stroke   . Status post coronary artery bypass grafting      intraoperative cholangiogram  . Gallstones   . Diverticulosis     colonic diverticulosis    Past Surgical History  Procedure Laterality Date  . Prostate cancer    . Coronary artery bypass graft    . Back surgery    . Rotator cuff repair      LEFT  . Cryotherapy      Prostate Cancer  . Cholecystectomy, laparoscopic      No prescriptions prior to admission   No Known Allergies   Social History  Substance Use Topics  . Smoking status: Never Smoker   . Smokeless tobacco: Not on file  . Alcohol Use: No    Family History  Problem Relation Age of Onset  . Heart failure Mother      Review of Systems  Constitutional: Negative.   HENT: Negative.   Eyes: Negative.   Respiratory: Negative.   Cardiovascular: Negative.   Gastrointestinal: Negative.   Genitourinary: Positive for frequency.  Musculoskeletal: Positive for joint pain.  Skin: Negative.   Neurological: Negative.   Endo/Heme/Allergies: Negative.   Psychiatric/Behavioral: Negative.     Objective:  Physical Exam  Constitutional: He is oriented to person, place, and time. He appears well-developed.  HENT:  Head: Normocephalic.  Eyes: Pupils are equal, round, and reactive to light.  Neck:  Neck supple. No JVD present. No tracheal deviation present. No thyromegaly present.  Cardiovascular: Normal rate, regular rhythm, normal heart sounds and intact distal pulses.   Respiratory: Effort normal and breath sounds normal. No stridor. No respiratory distress. He has no wheezes.  GI: Soft. There is no tenderness. There is no guarding.  Musculoskeletal:       Right knee: He exhibits decreased range of motion, swelling and bony tenderness. He exhibits no ecchymosis, no deformity, no laceration and no erythema. Tenderness found.  Lymphadenopathy:    He has no cervical adenopathy.  Neurological: He is alert  and oriented to person, place, and time.  Skin: Skin is warm and dry.  Psychiatric: He has a normal mood and affect.      Labs:  Estimated body mass index is 29.56 kg/(m^2) as calculated from the following:   Height as of 06/03/14: 6' (1.829 m).   Weight as of 06/03/14: 98.884 kg (218 lb).   Imaging Review Plain radiographs demonstrate severe degenerative joint disease of the right knee(s).  The bone quality appears to be good for age and reported activity level.  Assessment/Plan:  End stage arthritis, right knee   The patient history, physical examination, clinical judgment of the provider and imaging studies are consistent with end stage degenerative joint disease of the right knee(s) and total knee arthroplasty is deemed medically necessary. The treatment options including medical management, injection therapy arthroscopy and arthroplasty were discussed at length. The risks and benefits of total knee arthroplasty were presented and reviewed. The risks due to aseptic loosening, infection, stiffness, patella tracking problems, thromboembolic complications and other imponderables were discussed. The patient acknowledged the explanation, agreed to proceed with the plan and consent was signed. Patient is being admitted for inpatient treatment for surgery, pain control, PT, OT, prophylactic antibiotics, VTE prophylaxis, progressive ambulation and ADL's and discharge planning. The patient is planning to be discharged home with home health services.     Brandon Pugh [name]   PA-C  12/05/2014, 12:54 PM

## 2014-12-10 ENCOUNTER — Encounter (HOSPITAL_COMMUNITY): Payer: Self-pay

## 2014-12-10 ENCOUNTER — Encounter (HOSPITAL_COMMUNITY)
Admission: RE | Admit: 2014-12-10 | Discharge: 2014-12-10 | Disposition: A | Discharge status: Home / Self Care | Payer: Medicare Other | Source: Ambulatory Visit | Attending: Orthopedic Surgery | Admitting: Orthopedic Surgery

## 2014-12-10 DIAGNOSIS — M179 Osteoarthritis of knee, unspecified: Secondary | ICD-10-CM | POA: Diagnosis not present

## 2014-12-10 DIAGNOSIS — Z01818 Encounter for other preprocedural examination: Secondary | ICD-10-CM | POA: Insufficient documentation

## 2014-12-10 HISTORY — DX: Unspecified urinary incontinence: R32

## 2014-12-10 HISTORY — DX: Unspecified osteoarthritis, unspecified site: M19.90

## 2014-12-10 HISTORY — DX: Acute myocardial infarction, unspecified: I21.9

## 2014-12-10 HISTORY — DX: Gastro-esophageal reflux disease without esophagitis: K21.9

## 2014-12-10 LAB — TYPE AND SCREEN
ABO/RH(D): O POS
Antibody Screen: NEGATIVE

## 2014-12-10 LAB — CBC
HCT: 41.1 % (ref 39.0–52.0)
Hemoglobin: 13.8 g/dL (ref 13.0–17.0)
MCH: 32.3 pg (ref 26.0–34.0)
MCHC: 33.6 g/dL (ref 30.0–36.0)
MCV: 96.3 fL (ref 78.0–100.0)
Platelets: 240 10*3/uL (ref 150–400)
RBC: 4.27 MIL/uL (ref 4.22–5.81)
RDW: 14 % (ref 11.5–15.5)
WBC: 9.7 10*3/uL (ref 4.0–10.5)

## 2014-12-10 LAB — URINALYSIS, ROUTINE W REFLEX MICROSCOPIC
Bilirubin Urine: NEGATIVE
Glucose, UA: NEGATIVE mg/dL
Hgb urine dipstick: NEGATIVE
Ketones, ur: NEGATIVE mg/dL
Leukocytes, UA: NEGATIVE
Nitrite: NEGATIVE
Protein, ur: NEGATIVE mg/dL
Specific Gravity, Urine: 1.002 — ABNORMAL LOW (ref 1.005–1.030)
Urobilinogen, UA: 1 mg/dL (ref 0.0–1.0)
pH: 6.5 (ref 5.0–8.0)

## 2014-12-10 LAB — BASIC METABOLIC PANEL
Anion gap: 7 (ref 5–15)
BUN: 20 mg/dL (ref 6–20)
CO2: 23 mmol/L (ref 22–32)
Calcium: 9.3 mg/dL (ref 8.9–10.3)
Chloride: 108 mmol/L (ref 101–111)
Creatinine, Ser: 1.79 mg/dL — ABNORMAL HIGH (ref 0.61–1.24)
GFR calc Af Amer: 38 mL/min — ABNORMAL LOW (ref >60)
GFR calc non Af Amer: 33 mL/min — ABNORMAL LOW (ref >60)
Glucose, Bld: 102 mg/dL — ABNORMAL HIGH (ref 65–99)
Potassium: 4.4 mmol/L (ref 3.5–5.1)
Sodium: 138 mmol/L (ref 135–145)

## 2014-12-10 LAB — APTT: aPTT: 30 seconds (ref 24–37)

## 2014-12-10 LAB — ABO/RH: ABO/RH(D): O POS

## 2014-12-10 LAB — PROTIME-INR
INR: 1.02 (ref 0.00–1.49)
Prothrombin Time: 13.6 seconds (ref 11.6–15.2)

## 2014-12-10 LAB — SURGICAL PCR SCREEN
MRSA, PCR: NEGATIVE
Staphylococcus aureus: NEGATIVE

## 2014-12-10 NOTE — Patient Instructions (Signed)
Brandon Harris  12/10/2014   Your procedure is scheduled on: 11/8/016    Report to [facility] Main  Entrance take [place]  elevators to 3rd floor to  [place] at    0700 AM.  Call this number if you have problems the morning of surgery [phone]   Remember: ONLY 1 PERSON MAY GO WITH YOU TO SHORT STAY TO GET  READY MORNING OF [place].  Do not eat food or drink liquids :After Midnight.     Take these medicines the morning of surgery with A SIP OF WATER: amlodipine ( NOrvasc), Omeprazole ( Prilosec) DO NOT TAKE ANY DIABETIC MEDICATIONS DAY OF YOUR SURGERY                               You may not have any metal on your body including hair pins and              piercings  Do not wear jewelry,  lotions, powders or perfumes, deodorant                          Men may shave face and neck.   Do not bring valuables to the hospital. [place].  Contacts, dentures or bridgework may not be worn into surgery.  Leave suitcase in the car. After surgery it may be brought to your room.       :  Special Instructions: coughing and deep breathing exercises, leg exercises               Please read over the following fact sheets you were given: _____________________________________________________________________             [facility] - Preparing for Surgery Before surgery, you can play an important role.  Because skin is not sterile, your skin needs to be as free of germs as possible.  You can reduce the number of germs on your skin by washing with CHG (chlorahexidine gluconate) soap before surgery.  CHG is an antiseptic cleaner which kills germs and bonds with the skin to continue killing germs even after washing. Please DO NOT use if you have an allergy to CHG or antibacterial soaps.  If your skin becomes reddened/irritated stop using the CHG and inform your nurse when you arrive at Short Stay. Do not shave  (including legs and underarms) for at least 48 hours prior to the first CHG shower.  You may shave your face/neck. Please follow these instructions carefully:  1.  Shower with CHG Soap the night before surgery and the  morning of Surgery.  2.  If you choose to wash your hair, wash your hair first as usual with your  normal  shampoo.  3.  After you shampoo, rinse your hair and body thoroughly to remove the  shampoo.                           4.  Use CHG as you would any other liquid soap.  You can apply chg directly  to the skin and wash  Gently with a scrungie or clean washcloth.  5.  Apply the CHG Soap to your body ONLY FROM THE NECK DOWN.   Do not use on face/ open                           Wound or open sores. Avoid contact with eyes, ears mouth and genitals (private parts).                       Wash face,  Genitals (private parts) with your normal soap.             6.  Wash thoroughly, paying special attention to the area where your surgery  will be performed.  7.  Thoroughly rinse your body with warm water from the neck down.  8.  DO NOT shower/wash with your normal soap after using and rinsing off  the CHG Soap.                9.  Pat yourself dry with a clean towel.            10.  Wear clean pajamas.            11.  Place clean sheets on your bed the night of your first shower and do not  sleep with pets. Day of Surgery : Do not apply any lotions/deodorants the morning of surgery.  Please wear clean clothes to the hospital/surgery center.  FAILURE TO FOLLOW THESE INSTRUCTIONS MAY RESULT IN THE CANCELLATION OF YOUR SURGERY PATIENT SIGNATURE_________________________________  NURSE SIGNATURE__________________________________  ________________________________________________________________________  WHAT IS A BLOOD TRANSFUSION? Blood Transfusion Information  A transfusion is the replacement of blood or some of its parts. Blood is made up of multiple cells which  provide different functions.  Red blood cells carry oxygen and are used for blood loss replacement.  White blood cells fight against infection.  Platelets control bleeding.  Plasma helps clot blood.  Other blood products are available for specialized needs, such as hemophilia or other clotting disorders. BEFORE THE TRANSFUSION  Who gives blood for transfusions?   Healthy volunteers who are fully evaluated to make sure their blood is safe. This is blood bank blood. Transfusion therapy is the safest it has ever been in the practice of medicine. Before blood is taken from a donor, a complete history is taken to make sure that person has no history of diseases nor engages in risky social behavior (examples are intravenous drug use or sexual activity with multiple partners). The donor's travel history is screened to minimize risk of transmitting infections, such as malaria. The donated blood is tested for signs of infectious diseases, such as HIV and hepatitis. The blood is then tested to be sure it is compatible with you in order to minimize the chance of a transfusion reaction. If you or a relative donates blood, this is often done in anticipation of surgery and is not appropriate for emergency situations. It takes many days to process the donated blood. RISKS AND COMPLICATIONS Although transfusion therapy is very safe and saves many lives, the main dangers of transfusion include:  1. Getting an infectious disease. 2. Developing a transfusion reaction. This is an allergic reaction to something in the blood you were given. Every precaution is taken to prevent this. The decision to have a blood transfusion has been considered carefully by your caregiver before blood is given. Blood is not given unless the benefits outweigh  the risks. AFTER THE TRANSFUSION  Right after receiving a blood transfusion, you will usually feel much better and more energetic. This is especially true if your red blood cells  have gotten low (anemic). The transfusion raises the level of the red blood cells which carry oxygen, and this usually causes an energy increase.  The nurse administering the transfusion will monitor you carefully for complications. HOME CARE INSTRUCTIONS  No special instructions are needed after a transfusion. You may find your energy is better. Speak with your caregiver about any limitations on activity for underlying diseases you may have. SEEK MEDICAL CARE IF:   Your condition is not improving after your transfusion.  You develop redness or irritation at the intravenous (IV) site. SEEK IMMEDIATE MEDICAL CARE IF:  Any of the following symptoms occur over the next 12 hours:  Shaking chills.  You have a temperature by mouth above 102 F (38.9 C), not controlled by medicine.  Chest, back, or muscle pain.  People around you feel you are not acting correctly or are confused.  Shortness of breath or difficulty breathing.  Dizziness and fainting.  You get a rash or develop hives.  You have a decrease in urine output.  Your urine turns a dark color or changes to pink, red, or brown. Any of the following symptoms occur over the next 10 days:  You have a temperature by mouth above 102 F (38.9 C), not controlled by medicine.  Shortness of breath.  Weakness after normal activity.  The white part of the eye turns yellow (jaundice).  You have a decrease in the amount of urine or are urinating less often.  Your urine turns a dark color or changes to pink, red, or brown. Document Released: 01/24/2000 Document Revised: 04/20/2011 Document Reviewed: 09/12/2007 ExitCare Patient Information 2014 [place].  _______________________________________________________________________  Incentive Spirometer  An incentive spirometer is a tool that can help keep your lungs clear and active. This tool measures how well you are filling your lungs with each breath. Taking long deep  breaths may help reverse or decrease the chance of developing breathing (pulmonary) problems (especially infection) following:  A long period of time when you are unable to move or be active. BEFORE THE PROCEDURE   If the spirometer includes an indicator to show your best effort, your nurse or respiratory therapist will set it to a desired goal.  If possible, sit up straight or lean slightly forward. Try not to slouch.  Hold the incentive spirometer in an upright position. INSTRUCTIONS FOR USE  3. Sit on the edge of your bed if possible, or sit up as far as you can in bed or on a chair. 4. Hold the incentive spirometer in an upright position. 5. Breathe out normally. 6. Place the mouthpiece in your mouth and seal your lips tightly around it. 7. Breathe in slowly and as deeply as possible, raising the piston or the ball toward the top of the column. 8. Hold your breath for 3-5 seconds or for as long as possible. Allow the piston or ball to fall to the bottom of the column. 9. Remove the mouthpiece from your mouth and breathe out normally. 10. Rest for a few seconds and repeat Steps 1 through 7 at least 10 times every 1-2 hours when you are awake. Take your time and take a few normal breaths between deep breaths. 11. The spirometer may include an indicator to show your best effort. Use the indicator as a goal to work  toward during each repetition. 12. After each set of 10 deep breaths, practice coughing to be sure your lungs are clear. If you have an incision (the cut made at the time of surgery), support your incision when coughing by placing a pillow or rolled up towels firmly against it. Once you are able to get out of bed, walk around indoors and cough well. You may stop using the incentive spirometer when instructed by your caregiver.  RISKS AND COMPLICATIONS  Take your time so you do not get dizzy or light-headed.  If you are in pain, you may need to take or ask for pain medication  before doing incentive spirometry. It is harder to take a deep breath if you are having pain. AFTER USE  Rest and breathe slowly and easily.  It can be helpful to keep track of a log of your progress. Your caregiver can provide you with a simple table to help with this. If you are using the spirometer at home, follow these instructions: [place] IF:   You are having difficultly using the spirometer.  You have trouble using the spirometer as often as instructed.  Your pain medication is not giving enough relief while using the spirometer.  You develop fever of 100.5 F (38.1 C) or higher. SEEK IMMEDIATE MEDICAL CARE IF:   You cough up bloody sputum that had not been present before.  You develop fever of 102 F (38.9 C) or greater.  You develop worsening pain at or near the incision site. MAKE SURE YOU:   Understand these instructions.  Will watch your condition.  Will get help right away if you are not doing well or get worse. Document Released: 06/08/2006 Document Revised: 04/20/2011 Document Reviewed: 08/09/2006 [facility] Patient Information 2014 [place], [place].   ________________________________________________________________________

## 2014-12-10 NOTE — Progress Notes (Signed)
EKG-03/07/14- epic LOV- cardiology- 03/07/2014- epic  Requested clearance from Orson Slick at office of Dr Alvan Dame. Per note in EPIC states clearance from cardiology was sent to [place].

## 2014-12-10 NOTE — Progress Notes (Signed)
BMP done 12/10/14 faxed via EPIC to Dr Alvan Dame.   Clearance from Dr Daneen Schick received and placed on chart.

## 2014-12-18 ENCOUNTER — Encounter (HOSPITAL_COMMUNITY): Payer: Self-pay | Admitting: *Deleted

## 2014-12-18 ENCOUNTER — Encounter (HOSPITAL_COMMUNITY)
Admission: RE | Disposition: A | Discharge status: Home Health | Payer: Self-pay | Source: Ambulatory Visit | Attending: Orthopedic Surgery

## 2014-12-18 ENCOUNTER — Inpatient Hospital Stay (HOSPITAL_COMMUNITY): Payer: Medicare Other | Admitting: Anesthesiology

## 2014-12-18 ENCOUNTER — Inpatient Hospital Stay (HOSPITAL_COMMUNITY)
Admission: RE | Admit: 2014-12-18 | Discharge: 2014-12-19 | DRG: 470 | Disposition: A | Discharge status: Home Health | Payer: Medicare Other | Source: Ambulatory Visit | Attending: Orthopedic Surgery | Admitting: Orthopedic Surgery

## 2014-12-18 DIAGNOSIS — Z8673 Personal history of transient ischemic attack (TIA), and cerebral infarction without residual deficits: Secondary | ICD-10-CM | POA: Diagnosis not present

## 2014-12-18 DIAGNOSIS — M25561 Pain in right knee: Secondary | ICD-10-CM | POA: Diagnosis not present

## 2014-12-18 DIAGNOSIS — G8918 Other acute postprocedural pain: Secondary | ICD-10-CM | POA: Diagnosis not present

## 2014-12-18 DIAGNOSIS — Z01812 Encounter for preprocedural laboratory examination: Secondary | ICD-10-CM | POA: Diagnosis not present

## 2014-12-18 DIAGNOSIS — I10 Essential (primary) hypertension: Secondary | ICD-10-CM | POA: Diagnosis present

## 2014-12-18 DIAGNOSIS — I252 Old myocardial infarction: Secondary | ICD-10-CM

## 2014-12-18 DIAGNOSIS — M659 Synovitis and tenosynovitis, unspecified: Secondary | ICD-10-CM | POA: Diagnosis present

## 2014-12-18 DIAGNOSIS — E785 Hyperlipidemia, unspecified: Secondary | ICD-10-CM | POA: Diagnosis present

## 2014-12-18 DIAGNOSIS — M1711 Unilateral primary osteoarthritis, right knee: Principal | ICD-10-CM | POA: Diagnosis present

## 2014-12-18 DIAGNOSIS — Z96651 Presence of right artificial knee joint: Secondary | ICD-10-CM

## 2014-12-18 DIAGNOSIS — Z8546 Personal history of malignant neoplasm of prostate: Secondary | ICD-10-CM | POA: Diagnosis not present

## 2014-12-18 DIAGNOSIS — Z6828 Body mass index (BMI) 28.0-28.9, adult: Secondary | ICD-10-CM

## 2014-12-18 DIAGNOSIS — I2510 Atherosclerotic heart disease of native coronary artery without angina pectoris: Secondary | ICD-10-CM | POA: Diagnosis present

## 2014-12-18 DIAGNOSIS — I25810 Atherosclerosis of coronary artery bypass graft(s) without angina pectoris: Secondary | ICD-10-CM | POA: Diagnosis not present

## 2014-12-18 DIAGNOSIS — E663 Overweight: Secondary | ICD-10-CM | POA: Diagnosis present

## 2014-12-18 DIAGNOSIS — M179 Osteoarthritis of knee, unspecified: Secondary | ICD-10-CM | POA: Diagnosis not present

## 2014-12-18 DIAGNOSIS — Z96659 Presence of unspecified artificial knee joint: Secondary | ICD-10-CM

## 2014-12-18 HISTORY — PX: TOTAL KNEE ARTHROPLASTY: SHX125

## 2014-12-18 SURGERY — ARTHROPLASTY, KNEE, TOTAL
Anesthesia: General | Site: Knee | Laterality: Right

## 2014-12-18 MED ORDER — HYDROMORPHONE HCL 2 MG/ML IJ SOLN
INTRAMUSCULAR | Status: AC
  Filled 2014-12-18: qty 1

## 2014-12-18 MED ORDER — KETOROLAC TROMETHAMINE 30 MG/ML IJ SOLN
INTRAMUSCULAR | Status: DC | PRN
  Administered 2014-12-18: 30 mg via INTRAVENOUS

## 2014-12-18 MED ORDER — DEXAMETHASONE SODIUM PHOSPHATE 10 MG/ML IJ SOLN
INTRAMUSCULAR | Status: AC
  Filled 2014-12-18: qty 1

## 2014-12-18 MED ORDER — DEXAMETHASONE SODIUM PHOSPHATE 10 MG/ML IJ SOLN
INTRAMUSCULAR | Status: DC | PRN
  Administered 2014-12-18: 10 mg via INTRAVENOUS

## 2014-12-18 MED ORDER — POLYETHYLENE GLYCOL 3350 17 G PO PACK
17.0000 g | PACK | Freq: Two times a day (BID) | ORAL | Status: DC
  Administered 2014-12-18 – 2014-12-19 (×2): 17 g via ORAL

## 2014-12-18 MED ORDER — LIDOCAINE HCL (CARDIAC) 20 MG/ML IV SOLN
INTRAVENOUS | Status: AC
  Filled 2014-12-18: qty 5

## 2014-12-18 MED ORDER — CEFAZOLIN SODIUM-DEXTROSE 2-3 GM-% IV SOLR
INTRAVENOUS | Status: AC
  Filled 2014-12-18: qty 50

## 2014-12-18 MED ORDER — BISACODYL 10 MG RE SUPP
10.0000 mg | Freq: Every day | RECTAL | Status: DC | PRN
Start: 2014-12-18 — End: 2014-12-19

## 2014-12-18 MED ORDER — NEOSTIGMINE METHYLSULFATE 10 MG/10ML IV SOLN
INTRAVENOUS | Status: DC | PRN
  Administered 2014-12-18: 3 mg via INTRAVENOUS

## 2014-12-18 MED ORDER — NON FORMULARY: 20.0000 mg | Freq: Every day | Status: DC

## 2014-12-18 MED ORDER — TRANEXAMIC ACID 1000 MG/10ML IV SOLN
2000.0000 mg | INTRAVENOUS | Status: DC | PRN
  Administered 2014-12-18: 2000 mg via TOPICAL

## 2014-12-18 MED ORDER — ROCURONIUM BROMIDE 100 MG/10ML IV SOLN
INTRAVENOUS | Status: DC | PRN
  Administered 2014-12-18: 20 mg via INTRAVENOUS
  Administered 2014-12-18: 10 mg via INTRAVENOUS

## 2014-12-18 MED ORDER — ASPIRIN EC 325 MG PO TBEC
325.0000 mg | DELAYED_RELEASE_TABLET | Freq: Two times a day (BID) | ORAL | Status: DC
  Administered 2014-12-19: 325 mg via ORAL
  Filled 2014-12-18 (×3): qty 1

## 2014-12-18 MED ORDER — DOCUSATE SODIUM 100 MG PO CAPS
100.0000 mg | ORAL_CAPSULE | Freq: Two times a day (BID) | ORAL | Status: DC
  Administered 2014-12-18 – 2014-12-19 (×2): 100 mg via ORAL

## 2014-12-18 MED ORDER — ZOLPIDEM TARTRATE 10 MG PO TABS: 5.0000 mg | ORAL_TABLET | Freq: Every evening | ORAL | Status: DC | PRN

## 2014-12-18 MED ORDER — ALUM & MAG HYDROXIDE-SIMETH 200-200-20 MG/5ML PO SUSP: 30.0000 mL | ORAL | Status: DC | PRN

## 2014-12-18 MED ORDER — ONDANSETRON HCL 4 MG/2ML IJ SOLN
INTRAMUSCULAR | Status: AC
  Filled 2014-12-18: qty 2

## 2014-12-18 MED ORDER — HYDROMORPHONE HCL 1 MG/ML IJ SOLN
0.2500 mg | INTRAMUSCULAR | Status: DC | PRN
  Administered 2014-12-18 (×2): 0.5 mg via INTRAVENOUS

## 2014-12-18 MED ORDER — EPHEDRINE SULFATE 50 MG/ML IJ SOLN
INTRAMUSCULAR | Status: DC | PRN
  Administered 2014-12-18 (×2): 5 mg via INTRAVENOUS

## 2014-12-18 MED ORDER — ONDANSETRON HCL 4 MG/2ML IJ SOLN
4.0000 mg | Freq: Four times a day (QID) | INTRAMUSCULAR | Status: DC | PRN
  Administered 2014-12-18: 4 mg via INTRAVENOUS
  Filled 2014-12-18: qty 2

## 2014-12-18 MED ORDER — CEFAZOLIN SODIUM-DEXTROSE 2-3 GM-% IV SOLR
2.0000 g | INTRAVENOUS | Status: AC
  Administered 2014-12-18: 2 g via INTRAVENOUS

## 2014-12-18 MED ORDER — PROPOFOL 10 MG/ML IV BOLUS
INTRAVENOUS | Status: AC
  Filled 2014-12-18: qty 20

## 2014-12-18 MED ORDER — SODIUM CHLORIDE 0.9 % IJ SOLN
INTRAMUSCULAR | Status: AC
  Filled 2014-12-18: qty 50

## 2014-12-18 MED ORDER — DEXAMETHASONE SODIUM PHOSPHATE 10 MG/ML IJ SOLN
10.0000 mg | Freq: Once | INTRAMUSCULAR | Status: DC
  Filled 2014-12-18: qty 1

## 2014-12-18 MED ORDER — METOCLOPRAMIDE HCL 5 MG/ML IJ SOLN
5.0000 mg | Freq: Three times a day (TID) | INTRAMUSCULAR | Status: DC | PRN
  Administered 2014-12-18: 10 mg via INTRAVENOUS
  Filled 2014-12-18: qty 2

## 2014-12-18 MED ORDER — SODIUM CHLORIDE 0.9 % IV SOLN
INTRAVENOUS | Status: DC
  Administered 2014-12-18 – 2014-12-19 (×2): via INTRAVENOUS
  Filled 2014-12-18 (×4): qty 1000

## 2014-12-18 MED ORDER — HYDROMORPHONE HCL 1 MG/ML IJ SOLN: 0.5000 mg | INTRAMUSCULAR | Status: DC | PRN

## 2014-12-18 MED ORDER — ONDANSETRON HCL 4 MG PO TABS: 4.0000 mg | ORAL_TABLET | Freq: Four times a day (QID) | ORAL | Status: DC | PRN

## 2014-12-18 MED ORDER — MIRABEGRON ER 50 MG PO TB24
50.0000 mg | ORAL_TABLET | Freq: Every day | ORAL | Status: DC
  Administered 2014-12-18 – 2014-12-19 (×2): 50 mg via ORAL
  Filled 2014-12-18 (×2): qty 1

## 2014-12-18 MED ORDER — ALPRAZOLAM 0.25 MG PO TABS: 0.2500 mg | ORAL_TABLET | Freq: Every evening | ORAL | Status: DC | PRN

## 2014-12-18 MED ORDER — SODIUM CHLORIDE 0.9 % IJ SOLN
INTRAMUSCULAR | Status: DC | PRN
  Administered 2014-12-18: 30 mL

## 2014-12-18 MED ORDER — MENTHOL 3 MG MT LOZG: 1.0000 | LOZENGE | OROMUCOSAL | Status: DC | PRN

## 2014-12-18 MED ORDER — LOSARTAN POTASSIUM 50 MG PO TABS
100.0000 mg | ORAL_TABLET | Freq: Every day | ORAL | Status: DC
  Administered 2014-12-18 – 2014-12-19 (×2): 100 mg via ORAL
  Filled 2014-12-18 (×2): qty 2

## 2014-12-18 MED ORDER — HYDROMORPHONE HCL 1 MG/ML IJ SOLN
INTRAMUSCULAR | Status: DC | PRN
  Administered 2014-12-18 (×2): .4 mg via INTRAVENOUS

## 2014-12-18 MED ORDER — DIPHENHYDRAMINE HCL 25 MG PO CAPS: 25.0000 mg | ORAL_CAPSULE | Freq: Four times a day (QID) | ORAL | Status: DC | PRN

## 2014-12-18 MED ORDER — ROPIVACAINE HCL 5 MG/ML IJ SOLN
INTRAMUSCULAR | Status: DC | PRN
  Administered 2014-12-18 (×2): 30 mL via PERINEURAL

## 2014-12-18 MED ORDER — GLYCOPYRROLATE 0.2 MG/ML IJ SOLN
INTRAMUSCULAR | Status: DC | PRN
  Administered 2014-12-18: 0.4 mg via INTRAVENOUS

## 2014-12-18 MED ORDER — PHENOL 1.4 % MT LIQD
1.0000 | OROMUCOSAL | Status: DC | PRN
  Filled 2014-12-18: qty 177

## 2014-12-18 MED ORDER — BUPIVACAINE-EPINEPHRINE (PF) 0.25% -1:200000 IJ SOLN
INTRAMUSCULAR | Status: DC | PRN
  Administered 2014-12-18: 30 mL

## 2014-12-18 MED ORDER — METHOCARBAMOL 500 MG PO TABS
500.0000 mg | ORAL_TABLET | Freq: Four times a day (QID) | ORAL | Status: DC | PRN
  Administered 2014-12-19: 500 mg via ORAL
  Filled 2014-12-18: qty 1

## 2014-12-18 MED ORDER — FERROUS SULFATE 325 (65 FE) MG PO TABS
325.0000 mg | ORAL_TABLET | Freq: Three times a day (TID) | ORAL | Status: DC
  Filled 2014-12-18 (×5): qty 1

## 2014-12-18 MED ORDER — ATORVASTATIN CALCIUM 80 MG PO TABS
80.0000 mg | ORAL_TABLET | Freq: Every day | ORAL | Status: DC
  Administered 2014-12-18 – 2014-12-19 (×2): 80 mg via ORAL
  Filled 2014-12-18 (×2): qty 1

## 2014-12-18 MED ORDER — FENTANYL CITRATE (PF) 100 MCG/2ML IJ SOLN
INTRAMUSCULAR | Status: AC
  Filled 2014-12-18: qty 4

## 2014-12-18 MED ORDER — BUPIVACAINE-EPINEPHRINE (PF) 0.25% -1:200000 IJ SOLN
INTRAMUSCULAR | Status: AC
  Filled 2014-12-18: qty 30

## 2014-12-18 MED ORDER — TAMSULOSIN HCL 0.4 MG PO CAPS
0.8000 mg | ORAL_CAPSULE | Freq: Every day | ORAL | Status: DC
  Administered 2014-12-18: 0.8 mg via ORAL
  Filled 2014-12-18 (×2): qty 2

## 2014-12-18 MED ORDER — DEXAMETHASONE SODIUM PHOSPHATE 10 MG/ML IJ SOLN: 10.0000 mg | Freq: Once | INTRAMUSCULAR | Status: DC

## 2014-12-18 MED ORDER — NEOSTIGMINE METHYLSULFATE 10 MG/10ML IV SOLN
INTRAVENOUS | Status: AC
  Filled 2014-12-18: qty 1

## 2014-12-18 MED ORDER — CHLORHEXIDINE GLUCONATE 4 % EX LIQD: 60.0000 mL | Freq: Once | CUTANEOUS | Status: DC

## 2014-12-18 MED ORDER — MAGNESIUM CITRATE PO SOLN: 1.0000 | Freq: Once | ORAL | Status: DC | PRN

## 2014-12-18 MED ORDER — LIDOCAINE HCL 1 % IJ SOLN
INTRAMUSCULAR | Status: AC
  Filled 2014-12-18: qty 20

## 2014-12-18 MED ORDER — HYDROCODONE-ACETAMINOPHEN 7.5-325 MG PO TABS
1.0000 | ORAL_TABLET | ORAL | Status: DC
  Administered 2014-12-18 – 2014-12-19 (×5): 1 via ORAL
  Filled 2014-12-18 (×5): qty 1

## 2014-12-18 MED ORDER — LACTATED RINGERS IV SOLN: INTRAVENOUS | Status: DC

## 2014-12-18 MED ORDER — METOCLOPRAMIDE HCL 10 MG PO TABS: 5.0000 mg | ORAL_TABLET | Freq: Three times a day (TID) | ORAL | Status: DC | PRN

## 2014-12-18 MED ORDER — GLYCOPYRROLATE 0.2 MG/ML IJ SOLN
INTRAMUSCULAR | Status: AC
  Filled 2014-12-18: qty 3

## 2014-12-18 MED ORDER — LACTATED RINGERS IV SOLN
INTRAVENOUS | Status: DC
  Administered 2014-12-18 (×2): 1000 mL via INTRAVENOUS

## 2014-12-18 MED ORDER — SODIUM CHLORIDE 0.9 % IR SOLN
Status: DC | PRN
  Administered 2014-12-18: 1000 mL

## 2014-12-18 MED ORDER — ONDANSETRON HCL 4 MG/2ML IJ SOLN
INTRAMUSCULAR | Status: DC | PRN
  Administered 2014-12-18: 4 mg via INTRAVENOUS

## 2014-12-18 MED ORDER — METHOCARBAMOL 1000 MG/10ML IJ SOLN
500.0000 mg | Freq: Four times a day (QID) | INTRAVENOUS | Status: DC | PRN
  Filled 2014-12-18: qty 5

## 2014-12-18 MED ORDER — 0.9 % SODIUM CHLORIDE (POUR BTL) OPTIME
TOPICAL | Status: DC | PRN
  Administered 2014-12-18: 1000 mL

## 2014-12-18 MED ORDER — ROPIVACAINE HCL 5 MG/ML IJ SOLN
INTRAMUSCULAR | Status: AC
  Filled 2014-12-18: qty 30

## 2014-12-18 MED ORDER — KETOROLAC TROMETHAMINE 30 MG/ML IJ SOLN
INTRAMUSCULAR | Status: AC
  Filled 2014-12-18: qty 1

## 2014-12-18 MED ORDER — CEFAZOLIN SODIUM-DEXTROSE 2-3 GM-% IV SOLR
2.0000 g | Freq: Four times a day (QID) | INTRAVENOUS | Status: AC
  Administered 2014-12-18 (×2): 2 g via INTRAVENOUS
  Filled 2014-12-18 (×2): qty 50

## 2014-12-18 MED ORDER — ROCURONIUM BROMIDE 100 MG/10ML IV SOLN
INTRAVENOUS | Status: AC
  Filled 2014-12-18: qty 1

## 2014-12-18 MED ORDER — FENTANYL CITRATE (PF) 100 MCG/2ML IJ SOLN
INTRAMUSCULAR | Status: DC | PRN
  Administered 2014-12-18 (×4): 50 ug via INTRAVENOUS

## 2014-12-18 MED ORDER — CELECOXIB 200 MG PO CAPS
200.0000 mg | ORAL_CAPSULE | Freq: Two times a day (BID) | ORAL | Status: DC
  Administered 2014-12-18 – 2014-12-19 (×2): 200 mg via ORAL
  Filled 2014-12-18 (×3): qty 1

## 2014-12-18 MED ORDER — SUCCINYLCHOLINE CHLORIDE 20 MG/ML IJ SOLN
INTRAMUSCULAR | Status: DC | PRN
  Administered 2014-12-18: 100 mg via INTRAVENOUS

## 2014-12-18 MED ORDER — PROPOFOL 10 MG/ML IV BOLUS
INTRAVENOUS | Status: DC | PRN
  Administered 2014-12-18: 150 mg via INTRAVENOUS

## 2014-12-18 MED ORDER — TRANEXAMIC ACID 1000 MG/10ML IV SOLN
2000.0000 mg | Freq: Once | INTRAVENOUS | Status: DC
  Filled 2014-12-18: qty 20

## 2014-12-18 MED ORDER — LIDOCAINE HCL (CARDIAC) 20 MG/ML IV SOLN
INTRAVENOUS | Status: DC | PRN
  Administered 2014-12-18: 100 mg via INTRAVENOUS

## 2014-12-18 MED ORDER — OMEPRAZOLE 20 MG PO CPDR
20.0000 mg | DELAYED_RELEASE_CAPSULE | Freq: Every day | ORAL | Status: DC
  Administered 2014-12-19: 20 mg via ORAL
  Filled 2014-12-18 (×2): qty 1

## 2014-12-18 MED ORDER — AMLODIPINE BESYLATE 10 MG PO TABS
10.0000 mg | ORAL_TABLET | Freq: Every day | ORAL | Status: DC
  Administered 2014-12-19: 10 mg via ORAL
  Filled 2014-12-18: qty 1

## 2014-12-18 MED ORDER — HYDROMORPHONE HCL 1 MG/ML IJ SOLN
INTRAMUSCULAR | Status: AC
  Filled 2014-12-18: qty 1

## 2014-12-18 SURGICAL SUPPLY — 44 items
BAG DECANTER FOR FLEXI CONT (MISCELLANEOUS) ×3 IMPLANT
BAG ZIPLOCK 12X15 (MISCELLANEOUS) IMPLANT
BANDAGE ELASTIC 6 VELCRO ST LF (GAUZE/BANDAGES/DRESSINGS) ×3 IMPLANT
BLADE SAW SGTL 13.0X1.19X90.0M (BLADE) ×3 IMPLANT
BOWL SMART MIX CTS (DISPOSABLE) ×3 IMPLANT
CAPT KNEE TOTAL 3 ATTUNE ×3 IMPLANT
CEMENT HV SMART SET (Cement) ×6 IMPLANT
CLOTH BEACON ORANGE TIMEOUT ST (SAFETY) ×3 IMPLANT
CUFF TOURN SGL QUICK 34 (TOURNIQUET CUFF) ×2
CUFF TRNQT CYL 34X4X40X1 (TOURNIQUET CUFF) ×1 IMPLANT
DECANTER SPIKE VIAL GLASS SM (MISCELLANEOUS) ×3 IMPLANT
DRAPE U-SHAPE 47X51 STRL (DRAPES) ×3 IMPLANT
DRSG AQUACEL AG ADV 3.5X10 (GAUZE/BANDAGES/DRESSINGS) ×3 IMPLANT
DURAPREP 26ML APPLICATOR (WOUND CARE) ×6 IMPLANT
ELECT REM PT RETURN 9FT ADLT (ELECTROSURGICAL) ×3
ELECTRODE REM PT RTRN 9FT ADLT (ELECTROSURGICAL) ×1 IMPLANT
GLOVE BIOGEL M 7.0 STRL (GLOVE) IMPLANT
GLOVE BIOGEL M STRL SZ7.5 (GLOVE) IMPLANT
GLOVE BIOGEL PI IND STRL 7.5 (GLOVE) ×1 IMPLANT
GLOVE BIOGEL PI IND STRL 8.5 (GLOVE) ×1 IMPLANT
GLOVE BIOGEL PI INDICATOR 7.5 (GLOVE) ×2
GLOVE BIOGEL PI INDICATOR 8.5 (GLOVE) ×2
GLOVE ECLIPSE 8.0 STRL XLNG CF (GLOVE) ×3 IMPLANT
GLOVE ORTHO TXT STRL SZ7.5 (GLOVE) ×6 IMPLANT
GOWN STRL REUS W/TWL LRG LVL3 (GOWN DISPOSABLE) ×3 IMPLANT
GOWN STRL REUS W/TWL XL LVL3 (GOWN DISPOSABLE) ×3 IMPLANT
HANDPIECE INTERPULSE COAX TIP (DISPOSABLE) ×2
LIQUID BAND (GAUZE/BANDAGES/DRESSINGS) ×3 IMPLANT
MANIFOLD NEPTUNE II (INSTRUMENTS) ×3 IMPLANT
PACK TOTAL KNEE CUSTOM (KITS) ×3 IMPLANT
POSITIONER SURGICAL ARM (MISCELLANEOUS) ×3 IMPLANT
SET HNDPC FAN SPRY TIP SCT (DISPOSABLE) ×1 IMPLANT
SET PAD KNEE POSITIONER (MISCELLANEOUS) ×3 IMPLANT
SUCTION FRAZIER 12FR DISP (SUCTIONS) ×3 IMPLANT
SUT MNCRL AB 4-0 PS2 18 (SUTURE) ×3 IMPLANT
SUT VIC AB 1 CT1 36 (SUTURE) ×3 IMPLANT
SUT VIC AB 2-0 CT1 27 (SUTURE) ×6
SUT VIC AB 2-0 CT1 TAPERPNT 27 (SUTURE) ×3 IMPLANT
SUT VLOC 180 0 24IN GS25 (SUTURE) ×3 IMPLANT
SYR 50ML LL SCALE MARK (SYRINGE) ×6 IMPLANT
TRAY FOLEY W/METER SILVER 16FR (SET/KITS/TRAYS/PACK) ×3 IMPLANT
WATER STERILE IRR 1500ML POUR (IV SOLUTION) ×3 IMPLANT
WRAP KNEE MAXI GEL POST OP (GAUZE/BANDAGES/DRESSINGS) ×3 IMPLANT
YANKAUER SUCT BULB TIP 10FT TU (MISCELLANEOUS) ×3 IMPLANT

## 2014-12-18 NOTE — Interval H&P Note (Signed)
History and Physical Interval Note:  12/18/2014 9:01 AM  Brandon Harris  has presented today for surgery, with the diagnosis of RIGHT KNEE OA  The various methods of treatment have been discussed with the patient and family. After consideration of risks, benefits and other options for treatment, the patient has consented to  Procedure(s): TOTAL RIGHT KNEE ARTHROPLASTY (Right) as a surgical intervention .  The patient's history has been reviewed, patient examined, no change in status, stable for surgery.  I have reviewed the patient's chart and labs.  Questions were answered to the patient's satisfaction.     Mauri Pole

## 2014-12-18 NOTE — Anesthesia Postprocedure Evaluation (Signed)
  Anesthesia Post-op Note  Patient: Brandon Harris  Procedure(s) Performed: Procedure(s) (LRB): TOTAL RIGHT KNEE ARTHROPLASTY (Right)  Patient Location: PACU  Anesthesia Type: Spinal  Level of Consciousness: awake and alert   Airway and Oxygen Therapy: Patient Spontanous Breathing  Post-op Pain: mild  Post-op Assessment: Post-op Vital signs reviewed, Patient's Cardiovascular Status Stable, Respiratory Function Stable, Patent Airway and No signs of Nausea or vomiting  Last Vitals:  Filed Vitals:   12/18/14 1530  BP: 148/53  Pulse: 62  Temp: 36.2 C  Resp: 13    Post-op Vital Signs: stable   Complications: No apparent anesthesia complications

## 2014-12-18 NOTE — Progress Notes (Signed)
Utilization review completed.  

## 2014-12-18 NOTE — Anesthesia Preprocedure Evaluation (Addendum)
Anesthesia Evaluation  Patient identified by MRN, date of birth, ID band Patient awake    Reviewed: Allergy & Precautions, H&P , NPO status , Patient's Chart, lab work & pertinent test results  Airway Mallampati: II  TM Distance: >3 FB Neck ROM: full    Dental no notable dental hx. (+) Dental Advisory Given   Pulmonary neg pulmonary ROS,    Pulmonary exam normal breath sounds clear to auscultation       Cardiovascular hypertension, Pt. on medications + CAD, + Past MI and + CABG  Normal cardiovascular exam Rhythm:regular Rate:Normal     Neuro/Psych Back surgery CVA negative psych ROS   GI/Hepatic negative GI ROS, Neg liver ROS, GERD  Medicated and Controlled,  Endo/Other  negative endocrine ROS  Renal/GU negative Renal ROSCRT 1.79  negative genitourinary   Musculoskeletal   Abdominal   Peds  Hematology negative hematology ROS (+)   Anesthesia Other Findings   Reproductive/Obstetrics negative OB ROS                           Anesthesia Physical Anesthesia Plan  ASA: III  Anesthesia Plan: General   Post-op Pain Management:    Induction: Intravenous  Airway Management Planned: Oral ETT  Additional Equipment:   Intra-op Plan:   Post-operative Plan: Extubation in OR  Informed Consent: I have reviewed the patients History and Physical, chart, labs and discussed the procedure including the risks, benefits and alternatives for the proposed anesthesia with the patient or authorized representative who has indicated his/her understanding and acceptance.   Dental Advisory Given  Plan Discussed with: CRNA and Surgeon  Anesthesia Plan Comments:        Anesthesia Quick Evaluation

## 2014-12-18 NOTE — Evaluation (Signed)
Physical Therapy Evaluation Patient Details Name: Brandon Harris MRN: [id number] DOB: Aug 16, 1928 Today's Date: 12/18/2014   History of Present Illness  Pt s/p R TKR with hx of MI, CVA, CABG and Back surgery x 4  Clinical Impression  Pt s/p R TKR presents with decreased R LE strength/ROM and post op pain limiting functional mobility.  Pt should progress to dc home with family assist and HHPT follow up.    Follow Up Recommendations Home health PT    Equipment Recommendations  None recommended by PT    Recommendations for Other Services OT consult     Precautions / Restrictions Precautions Precautions: Knee;Fall Restrictions Weight Bearing Restrictions: No Other Position/Activity Restrictions: WBAT      Mobility  Bed Mobility Overal bed mobility: Needs Assistance Bed Mobility: Supine to Sit;Sit to Supine     Supine to sit: Min assist;Mod assist Sit to supine: Min assist;Mod assist   General bed mobility comments: cues for sequence and use of L LE to self assist  Transfers Overall transfer level: Needs assistance Equipment used: Rolling walker (2 wheeled) Transfers: Sit to/from Stand Sit to Stand: Min assist         General transfer comment: cues for LE management and use of UEs to self assist  Ambulation/Gait Ambulation/Gait assistance: Min assist;Mod assist Ambulation Distance (Feet): 34 Feet Assistive device: Rolling walker (2 wheeled) Gait Pattern/deviations: Step-to pattern;Decreased step length - right;Decreased step length - left;Shuffle;Trunk flexed Gait velocity: decr   General Gait Details: cues for sequence, posture and position from [employer]            Wheelchair Mobility    Modified Rankin (Stroke Patients Only)       Balance                                             Pertinent Vitals/Pain Pain Assessment: 0-10 Pain Score: 3  Pain Location: R knee Pain Descriptors / Indicators: Aching;Sore Pain  Intervention(s): Limited activity within patient's tolerance;Monitored during session;Premedicated before session;Ice applied    Home Living Family/patient expects to be discharged to:: Private residence Living Arrangements: Spouse/significant other Available Help at Discharge: Family Type of Home: House Home Access: Stairs to enter Entrance Stairs-Rails: Right Entrance Stairs-Number of Steps: 2 Home Layout: One level Home Equipment: Environmental consultant - 2 wheels;Cane - single point;Bedside commode;Crutches      Prior Function Level of Independence: Independent               Hand Dominance        Extremity/Trunk Assessment   Upper Extremity Assessment: Overall WFL for tasks assessed           Lower Extremity Assessment: RLE deficits/detail      Cervical / Trunk Assessment: Kyphotic  Communication   Communication: No difficulties  Cognition Arousal/Alertness: Awake/alert Behavior During Therapy: WFL for tasks assessed/performed Overall Cognitive Status: Within Functional Limits for tasks assessed                      General Comments      Exercises        Assessment/Plan    PT Assessment Patient needs continued PT services  PT Diagnosis Difficulty walking   PT Problem List Decreased strength;Decreased range of motion;Decreased activity tolerance;Decreased mobility;Decreased knowledge of use of DME;Pain  PT Treatment Interventions DME instruction;Gait training;Stair training;Functional mobility  training;Therapeutic activities;Therapeutic exercise;Patient/family education   PT Goals (Current goals can be found in the Care Plan section) Acute Rehab PT Goals Patient Stated Goal: Resume previous lifestyle with decreased pain PT Goal Formulation: With patient Time For Goal Achievement: 12/22/14 Potential to Achieve Goals: Good    Frequency 7X/week   Barriers to discharge        Co-evaluation               End of Session Equipment Utilized  During Treatment: Gait belt Activity Tolerance: Patient tolerated treatment well Patient left: in bed;with call bell/phone within reach;with family/visitor present Nurse Communication: Mobility status         Time: 1435-1500 PT Time Calculation (min) (ACUTE ONLY): 25 min   Charges:   PT Evaluation $Initial PT Evaluation Tier I: 1 Procedure PT Treatments $Gait Training: 8-22 mins   PT G Codes:        Brandon Harris 2015/01/14, 5:14 PM

## 2014-12-18 NOTE — Transfer of Care (Signed)
Immediate Anesthesia Transfer of Care Note  Patient: Brandon Harris  Procedure(s) Performed: Procedure(s): TOTAL RIGHT KNEE ARTHROPLASTY (Right)  Patient Location: PACU  Anesthesia Type:General  Level of Consciousness: awake, alert , oriented and patient cooperative  Airway & Oxygen Therapy: Patient Spontanous Breathing and Patient connected to face mask oxygen  Post-op Assessment: Report given to RN, Post -op Vital signs reviewed and stable and Patient moving all extremities  Post vital signs: Reviewed and stable  Last Vitals:  Filed Vitals:   12/18/14 0959  BP: 163/70  Pulse: 69  Temp:   Resp: 18    Complications: No apparent anesthesia complications

## 2014-12-18 NOTE — Progress Notes (Signed)
AssistedDr. Landry Dyke with right, adductor canal block. Side rails up, monitors on throughout procedure. See vital signs in flow sheet. Tolerated Procedure well.

## 2014-12-18 NOTE — Discharge Instructions (Signed)

## 2014-12-18 NOTE — Anesthesia Procedure Notes (Addendum)
Anesthesia Regional Block:  Adductor canal block  Pre-Anesthetic Checklist: ,, timeout performed, Correct Patient, Correct Site, Correct Laterality, Correct Procedure, Correct Position, site marked, Risks and benefits discussed,  Surgical consent,  Pre-op evaluation,  At surgeon's request and post-op pain management  Laterality: Right  Prep: chloraprep       Needles:   Needle Type: Echogenic Needle     Needle Length: 10cm 10 cm Needle Gauge: 20 and 20 G    Additional Needles:  Procedures: ultrasound guided (picture in chart) Adductor canal block Narrative:  Start time: 12/18/2014 9:40 AM End time: 12/18/2014 9:47 AM Injection made incrementally with aspirations every 5 mL.  Performed by: Personally  Anesthesiologist: Rod Mae  Additional Notes: Patient tolerated procedure well without complications   Procedure Name: Intubation Date/Time: 12/18/2014 10:09 AM Performed by: Carleene Cooper A Pre-anesthesia Checklist: Patient identified, Timeout performed, Emergency Drugs available, Suction available and Patient being monitored Patient Re-evaluated:Patient Re-evaluated prior to inductionOxygen Delivery Method: Circle system utilized Preoxygenation: Pre-oxygenation with 100% oxygen Intubation Type: IV induction Ventilation: Mask ventilation without difficulty Laryngoscope Size: Mac and 4 Grade View: Grade I Tube type: Oral Tube size: 7.5 mm Number of attempts: 1 Airway Equipment and Method: Stylet Placement Confirmation: breath sounds checked- equal and bilateral,  ETT inserted through vocal cords under direct vision and positive ETCO2 Secured at: 21 cm Tube secured with: Tape Dental Injury: Teeth and Oropharynx as per pre-operative assessment

## 2014-12-18 NOTE — Op Note (Signed)
NAME:  Brandon Harris                      MEDICAL RECORD NO.:  [id number]                             FACILITY:  [facility]      PHYSICIAN:  Pietro Cassis. Alvan Dame, M.D.  DATE OF BIRTH:  1928/10/11      DATE OF PROCEDURE:  12/18/2014                                     OPERATIVE REPORT         PREOPERATIVE DIAGNOSIS:  Right knee osteoarthritis.      POSTOPERATIVE DIAGNOSIS:  Right knee osteoarthritis.      FINDINGS:  The patient was noted to have complete loss of cartilage and   bone-on-bone arthritis with associated osteophytes in the lateral and patellofemoral compartments of   the knee with a significant synovitis and associated effusion.      PROCEDURE:  Right total knee replacement.      COMPONENTS USED:  DePuy Attune rotating platform posterior stabilized knee   system, a size 6 femur, 7 tibia, size 7 mm PS AOX insert, and 41 anatomic patellar   button.      SURGEON:  Pietro Cassis. Alvan Dame, M.D.      ASSISTANT:  Danae Orleans, PA-C.      ANESTHESIA:  General.      SPECIMENS:  None.      COMPLICATION:  None.      DRAINS:  None.  EBL: <100cc      TOURNIQUET TIME:   Total Tourniquet Time Documented: Thigh (Right) - 30 minutes Total: Thigh (Right) - 30 minutes  .      The patient was stable to the recovery room.      INDICATION FOR PROCEDURE:  Brandon Harris is a 81 y.o. male patient of   mine.  The patient had been seen, evaluated, and treated conservatively in the   office with medication, activity modification, and injections.  The patient had   radiographic changes of bone-on-bone arthritis with endplate sclerosis and osteophytes noted.      The patient failed conservative measures including medication, injections, and activity modification, and at this point was ready for more definitive measures.   Based on the radiographic changes and failed conservative measures, the patient   decided to proceed with total knee replacement.  Risks of infection,   DVT, component  failure, need for revision surgery, postop course, and   expectations were all   discussed and reviewed.  Consent was obtained for benefit of pain   relief.      PROCEDURE IN DETAIL:  The patient was brought to the operative theater.   Once adequate anesthesia, preoperative antibiotics, 2 gm of Ancef, 1 gm of Tranexamic Acid, and 10 mg of Decadron administered, the patient was positioned supine with the right thigh tourniquet placed.  The  right lower extremity was prepped and draped in sterile fashion.  A time-   out was performed identifying the patient, planned procedure, and   extremity.      The right lower extremity was placed in the [facility] leg holder.  The leg was   exsanguinated, tourniquet elevated to 250 mmHg.  A midline incision was   made  followed by median parapatellar arthrotomy.  Following initial   exposure, attention was first directed to the patella.  Precut   measurement was noted to be 25 mm.  I resected down to 14 mm and used a   41 patellar button to restore patellar height as well as cover the cut   surface.      The lug holes were drilled and a metal shim was placed to protect the   patella from retractors and saw blades.      At this point, attention was now directed to the femur.  The femoral   canal was opened with a drill, irrigated to try to prevent fat emboli.  An   intramedullary rod was passed at 5 degrees valgus, 9 mm of bone was   resected off the distal femur.  Following this resection, the tibia was   subluxated anteriorly.  Using the extramedullary guide, 4 mm of bone was resected off   the proximal lateral tibia.  We confirmed the gap would be   stable medially and laterally with a size 6 mm insert as well as confirmed   the cut was perpendicular in the coronal plane, checking with an alignment rod.      Once this was done, I sized the femur to be a size 6 in the anterior-   posterior dimension, chose a standard component based on medial and    lateral dimension.  The size 6 rotation block was then pinned in   position anterior referenced using the C-clamp to set rotation.  The   anterior, posterior, and  chamfer cuts were made without difficulty nor   notching making certain that I was along the anterior cortex to help   with flexion gap stability.      The final box cut was made off the lateral aspect of distal femur.      At this point, the tibia was sized to be a size 7, the size 7 tray was   then pinned in position through the medial third of the tubercle,   drilled, and keel punched.  Trial reduction was now carried with a 6 femur,  7 tibia, a size 7 mm PS insert, and the 41 patella botton.  The knee was brought to   extension, full extension with good flexion stability with the patella   tracking through the trochlea without application of pressure.  Given   all these findings, the trial components removed.  Final components were   opened and cement was mixed.  The knee was irrigated with normal saline   solution and pulse lavage.  The synovial lining was   then injected with 30 cc 0.25% Marcaine with epinephrine and 1 cc of Toradol plus 30 cc of NS for a    total of 61 cc.      The knee was irrigated.  Final implants were then cemented onto clean and   dried cut surfaces of bone with the knee brought to extension with a size 7 mm trial insert.      Once the cement had fully cured, the excess cement was removed   throughout the knee.  I confirmed I was satisfied with the range of   motion and stability, and the final size 7 mm PS AOX insert was chosen.  It was   placed into the knee.      The tourniquet had been let down at 30 minutes.  No significant   hemostasis required.  The   extensor mechanism was then reapproximated using #1 Vicryl and #0 V-lock sutures with the knee   in flexion.  The   remaining wound was closed with 2-0 Vicryl and running 4-0 Monocryl.   The knee was cleaned, dried, dressed sterilely using  Dermabond and   Aquacel dressing.  The patient was then   brought to recovery room in stable condition, tolerating the procedure   well.   Please note that Physician Assistant, Danae Orleans, PA-C, was present for the entirety of the case, and was utilized for pre-operative positioning, peri-operative retractor management, general facilitation of the procedure.  He was also utilized for primary wound closure at the end of the case.              Pietro Cassis Alvan Dame, M.D.    12/18/2014 11:23 AM

## 2014-12-19 DIAGNOSIS — E663 Overweight: Secondary | ICD-10-CM | POA: Diagnosis present

## 2014-12-19 LAB — CBC
HCT: 35.6 % — ABNORMAL LOW (ref 39.0–52.0)
Hemoglobin: 11.7 g/dL — ABNORMAL LOW (ref 13.0–17.0)
MCH: 31.9 pg (ref 26.0–34.0)
MCHC: 32.9 g/dL (ref 30.0–36.0)
MCV: 97 fL (ref 78.0–100.0)
Platelets: 233 10*3/uL (ref 150–400)
RBC: 3.67 MIL/uL — ABNORMAL LOW (ref 4.22–5.81)
RDW: 13.8 % (ref 11.5–15.5)
WBC: 16.8 10*3/uL — ABNORMAL HIGH (ref 4.0–10.5)

## 2014-12-19 LAB — BASIC METABOLIC PANEL
Anion gap: 6 (ref 5–15)
BUN: 26 mg/dL — ABNORMAL HIGH (ref 6–20)
CO2: 23 mmol/L (ref 22–32)
Calcium: 8.9 mg/dL (ref 8.9–10.3)
Chloride: 108 mmol/L (ref 101–111)
Creatinine, Ser: 1.81 mg/dL — ABNORMAL HIGH (ref 0.61–1.24)
GFR calc Af Amer: 38 mL/min — ABNORMAL LOW (ref >60)
GFR calc non Af Amer: 33 mL/min — ABNORMAL LOW (ref >60)
Glucose, Bld: 128 mg/dL — ABNORMAL HIGH (ref 65–99)
Potassium: 4.4 mmol/L (ref 3.5–5.1)
Sodium: 137 mmol/L (ref 135–145)

## 2014-12-19 MED ORDER — DOCUSATE SODIUM 100 MG PO CAPS: 100.0000 mg | ORAL_CAPSULE | Freq: Two times a day (BID) | ORAL | Status: DC

## 2014-12-19 MED ORDER — TIZANIDINE HCL 4 MG PO TABS: 4.0000 mg | ORAL_TABLET | Freq: Four times a day (QID) | ORAL | Status: DC | PRN

## 2014-12-19 MED ORDER — FERROUS SULFATE 325 (65 FE) MG PO TABS: 325.0000 mg | ORAL_TABLET | Freq: Three times a day (TID) | ORAL | Status: DC

## 2014-12-19 MED ORDER — HYDROCODONE-ACETAMINOPHEN 7.5-325 MG PO TABS: 1.0000 | ORAL_TABLET | ORAL | Status: DC | PRN

## 2014-12-19 MED ORDER — POLYETHYLENE GLYCOL 3350 17 G PO PACK: 17.0000 g | PACK | Freq: Two times a day (BID) | ORAL | Status: DC

## 2014-12-19 MED ORDER — ASPIRIN 325 MG PO TBEC: 325.0000 mg | DELAYED_RELEASE_TABLET | Freq: Two times a day (BID) | ORAL | Status: AC

## 2014-12-19 NOTE — Progress Notes (Signed)
Physical Therapy Treatment Patient Details Name: Brandon Harris MRN: [id number] DOB: 1928-10-08 Today's Date: 12/19/2014    History of Present Illness Pt s/p R TKR with hx of MI, CVA, CABG and Back surgery x 4    PT Comments    Pt very motivated and progressing well with mobility.  Stairs deferred to arrival of dtr.  Follow Up Recommendations  Home health PT     Equipment Recommendations  None recommended by PT    Recommendations for Other Services OT consult     Precautions / Restrictions Precautions Precautions: Knee;Fall Restrictions Weight Bearing Restrictions: No Other Position/Activity Restrictions: WBAT    Mobility  Bed Mobility Overal bed mobility: Needs Assistance Bed Mobility: Supine to Sit     Supine to sit: Min assist     General bed mobility comments: OOB with OT - reports no difficulty   Transfers Overall transfer level: Needs assistance Equipment used: Rolling walker (2 wheeled) Transfers: Sit to/from Stand Sit to Stand: Min guard Stand pivot transfers: Min guard       General transfer comment: cues for use of hands to self assist  Ambulation/Gait Ambulation/Gait assistance: Min guard Ambulation Distance (Feet): 100 Feet Assistive device: Rolling walker (2 wheeled) Gait Pattern/deviations: Step-to pattern;Step-through pattern;Decreased step length - right;Decreased step length - left;Shuffle;Trunk flexed Gait velocity: decr   General Gait Details: cues for sequence, posture and position from [employer]            Wheelchair Mobility    Modified Rankin (Stroke Patients Only)       Balance                                    Cognition Arousal/Alertness: Awake/alert Behavior During Therapy: WFL for tasks assessed/performed Overall Cognitive Status: Within Functional Limits for tasks assessed                      Exercises Total Joint Exercises Ankle Circles/Pumps: AROM;Both;20 reps;Supine Quad  Sets: AROM;Both;15 reps;Supine Heel Slides: AAROM;Right;20 reps;Supine Straight Leg Raises: Right;15 reps;Supine;AAROM;AROM    General Comments        Pertinent Vitals/Pain Pain Assessment: 0-10 Pain Score: 3  Pain Location: R knee Pain Descriptors / Indicators: Aching;Sore Pain Intervention(s): Limited activity within patient's tolerance;Monitored during session;Premedicated before session;Ice applied    Home Living Family/patient expects to be discharged to:: Private residence Living Arrangements: Spouse/significant other Available Help at Discharge: Family Type of Home: House Home Access: Stairs to enter Entrance Stairs-Rails: Right Home Layout: One level Home Equipment: Environmental consultant - 2 wheels;Cane - single point;Bedside commode;Crutches      Prior Function Level of Independence: Independent          PT Goals (current goals can now be found in the care plan section) Acute Rehab PT Goals Patient Stated Goal: Resume previous lifestyle with decreased pain PT Goal Formulation: With patient Time For Goal Achievement: 12/22/14 Potential to Achieve Goals: Good Progress towards PT goals: Progressing toward goals    Frequency  7X/week    PT Plan Current plan remains appropriate    Co-evaluation             End of Session Equipment Utilized During Treatment: Gait belt Activity Tolerance: Patient tolerated treatment well Patient left: in chair;with call bell/phone within reach     Time: 0936-1000 PT Time Calculation (min) (ACUTE ONLY): 24 min  Charges:  $Gait Training:  8-22 mins $Therapeutic Exercise: 8-22 mins                    G Codes:      Brandon Harris 23-Dec-2014, 12:30 PM

## 2014-12-19 NOTE — Care Management Note (Signed)
Case Management Note  Patient Details  Name: NYRON MOZER MRN: [id number] Date of Birth: May 27, 1928  Subjective/Objective:                   TOTAL RIGHT KNEE ARTHROPLASTY (Right)  Action/Plan: Discharge  planning  Expected Discharge Date:   12/19/14              Expected Discharge Plan:  [place]  In-House Referral:     Discharge planning Services  CM Consult  Post Acute Care Choice:    Choice offered to:  Patient  DME Arranged:  N/A DME Agency:  NA  HH Arranged:  PT HH Agency:  [place]  Status of Service:  Completed, signed off  Medicare Important Message Given:    Date Medicare IM Given:    Medicare IM give by:    Date Additional Medicare IM Given:    Additional Medicare Important Message give by:     If discussed at [place] of Stay Meetings, dates discussed:    Additional Comments: CM met with pt in room to offer choice of home health agency.  Pt chooses [facility] to render HHPT.  Referral emailed to [employer], Tim.  No DME is needed.  No other Cm needs were communicated. Dellie Catholic, RN 12/19/2014, 11:36 AM

## 2014-12-19 NOTE — Progress Notes (Signed)
Physical Therapy Treatment Patient Details Name: Brandon Harris MRN: [id number] DOB: [birth year] Today's Date: 12/19/2014    History of Present Illness Pt s/p R TKR with hx of MI, CVA, CABG and Back surgery x 4    PT Comments    Pt daughter present for stair training with written instructions provided.  Follow Up Recommendations  Home health PT     Equipment Recommendations  None recommended by PT    Recommendations for Other Services OT consult     Precautions / Restrictions Precautions Precautions: Knee;Fall Restrictions Weight Bearing Restrictions: No Other Position/Activity Restrictions: WBAT    Mobility  Bed Mobility Overal bed mobility: Needs Assistance Bed Mobility: Supine to Sit     Supine to sit: Min assist     General bed mobility comments: OOB with OT - reports no difficulty   Transfers Overall transfer level: Needs assistance Equipment used: Rolling walker (2 wheeled) Transfers: Sit to/from Stand Sit to Stand: Min guard Stand pivot transfers: Min guard       General transfer comment: cues for use of hands to self assist  Ambulation/Gait Ambulation/Gait assistance: Min guard Ambulation Distance (Feet): 40 Feet Assistive device: Rolling walker (2 wheeled) Gait Pattern/deviations: Step-to pattern;Decreased step length - right;Decreased step length - left;Shuffle;Trunk flexed Gait velocity: decr   General Gait Details: cues for sequence, posture and position from RW   Stairs Stairs: Yes Stairs assistance: Min assist Stair Management: One rail Right;Step to pattern;Forwards;With crutches Number of Stairs: 5 General stair comments: cues for sequence and foot/crutch placement  Wheelchair Mobility    Modified Rankin (Stroke Patients Only)       Balance                                    Cognition Arousal/Alertness: Awake/alert Behavior During Therapy: WFL for tasks assessed/performed Overall Cognitive Status: Within  Functional Limits for tasks assessed                      Exercises Total Joint Exercises Ankle Circles/Pumps: AROM;Both;20 reps;Supine Quad Sets: AROM;Both;15 reps;Supine Heel Slides: AAROM;Right;20 reps;Supine Straight Leg Raises: Right;15 reps;Supine;AAROM;AROM    General Comments        Pertinent Vitals/Pain Pain Assessment: 0-10 Pain Score: 3  Pain Location: R knee Pain Descriptors / Indicators: Aching;Sore Pain Intervention(s): Limited activity within patient's tolerance;Monitored during session;Premedicated before session;Ice applied    Home Living Family/patient expects to be discharged to:: Private residence Living Arrangements: Spouse/significant other Available Help at Discharge: Family Type of Home: House Home Access: Stairs to enter Entrance Stairs-Rails: Right Home Layout: One level Home Equipment: Environmental consultant - 2 wheels;Cane - single point;Bedside commode;Crutches      Prior Function Level of Independence: Independent          PT Goals (current goals can now be found in the care plan section) Acute Rehab PT Goals Patient Stated Goal: Resume previous lifestyle with decreased pain PT Goal Formulation: With patient Time For Goal Achievement: 12/22/14 Potential to Achieve Goals: Good Progress towards PT goals: Progressing toward goals    Frequency  7X/week    PT Plan Current plan remains appropriate    Co-evaluation             End of Session Equipment Utilized During Treatment: Gait belt Activity Tolerance: Patient tolerated treatment well Patient left: in chair;with call bell/phone within reach     Time: 1010-1029 PT Time  Calculation (min) (ACUTE ONLY): 19 min  Charges:  $Gait Training: 8-22 mins $Therapeutic Exercise: 8-22 mins                    G Codes:      Megyn Leng 2015/01/04, 12:34 PM

## 2014-12-19 NOTE — Evaluation (Signed)
Occupational Therapy Evaluation Patient Details Name: Brandon Harris MRN: [id number] DOB: 31-Dec-1928 Today's Date: 12/19/2014    History of Present Illness Pt s/p R TKR with hx of MI, CVA, CABG and Back surgery x 4   Clinical Impression   Pt is s/p TKR.  Pt educated by OT regarding safety and independence with ADL and functional mobility for ADL to facilitate discharge home. Education complete    Follow Up Recommendations  No OT follow up    Equipment Recommendations  None recommended by OT       Precautions / Restrictions Precautions Precautions: Knee;Fall Restrictions Weight Bearing Restrictions: No Other Position/Activity Restrictions: WBAT      Mobility Bed Mobility Overal bed mobility: Needs Assistance Bed Mobility: Supine to Sit     Supine to sit: Min assist     General bed mobility comments: Vc for sequencing  Transfers Overall transfer level: Needs assistance Equipment used: Rolling walker (2 wheeled) Transfers: Sit to/from [employer] Sit to Stand: Min guard Stand pivot transfers: Min guard       General transfer comment: VC for hand placement         ADL Overall ADL's : Needs assistance/impaired     Grooming: Wash/dry face;Sitting           Upper Body Dressing : Set up;Sitting   Lower Body Dressing: Minimal assistance;Sit to/from stand   Toilet Transfer: Minimal Insurance claims handler Details (indicate cue type and reason): urinal Toileting- Clothing Manipulation and Hygiene: Min guard;Sit to/from Nurse, children's Details (indicate cue type and reason): verbalized safety Functional mobility during ADLs: Min guard;Cueing for safety;Cueing for sequencing General ADL Comments: wife will A as needed as well as children               Pertinent Vitals/Pain Pain Score: 3  Pain Location: r knee Pain Descriptors / Indicators: Sore Pain Intervention(s): Monitored during session     Hand  Dominance     Extremity/Trunk Assessment Upper Extremity Assessment Upper Extremity Assessment: Overall WFL for tasks assessed           Communication Communication Communication: No difficulties   Cognition Arousal/Alertness: Awake/alert Behavior During Therapy: WFL for tasks assessed/performed Overall Cognitive Status: Within Functional Limits for tasks assessed                                Home Living Family/patient expects to be discharged to:: Private residence Living Arrangements: Spouse/significant other Available Help at Discharge: Family Type of Home: House Home Access: Stairs to enter Technical brewer of Steps: 2 Entrance Stairs-Rails: Right Home Layout: One level     Bathroom Shower/Tub: Walk-in shower         Home Equipment: Environmental consultant - 2 wheels;Cane - single point;Bedside commode;Crutches          Prior Functioning/Environment Level of Independence: Independent                      OT Goals(Current goals can be found in the care plan section) Acute Rehab OT Goals Patient Stated Goal: Resume previous lifestyle with decreased pain  OT Frequency:     Barriers to D/C:               End of Session Nurse Communication: Mobility status  Activity Tolerance: Patient tolerated treatment well Patient left: in chair   Time: [id number] OT Time Calculation (min): 12  min Charges:  OT General Charges $OT Visit: 1 Procedure OT Evaluation $Initial OT Evaluation Tier I: 1 Procedure G-Codes:    Betsy Pries 01/04/15, 10:19 AM

## 2014-12-19 NOTE — Progress Notes (Signed)
     Subjective: 1 Day Post-Op Procedure(s) (LRB): TOTAL RIGHT KNEE ARTHROPLASTY (Right)   Patient reports pain as mild, pain controlled. No events throughout the night. States that he felt comfortable with PT yesterday. Ready to be discharged home if he continues to do well.  Objective:   VITALS:   Filed Vitals:   12/19/14 0635  BP: 147/70  Pulse: 70  Temp: 97.4 F (36.3 C)  Resp: 16    Dorsiflexion/Plantar flexion intact Incision: dressing C/D/I No cellulitis present Compartment soft  LABS  Recent Labs  12/19/14 0518  HGB 11.7*  HCT 35.6*  WBC 16.8*  PLT 233     Recent Labs  12/19/14 0518  NA 137  K 4.4  BUN 26*  CREATININE 1.81*  GLUCOSE 128*     Assessment/Plan: 1 Day Post-Op Procedure(s) (LRB): TOTAL RIGHT KNEE ARTHROPLASTY (Right) Foley cath d/c'ed Advance diet Up with therapy D/C IV fluids Discharge home with home health Follow up in 2 weeks at [facility]. Follow up with OLIN,Alaiza Yau D in 2 weeks.  Contact information:  [facility] [address], [place] [id number]    Overweight (BMI 25-29.9)  Estimated body mass index is 28.2 kg/(m^2) as calculated from the following:   Height as of this encounter: 6' (1.829 m).   Weight as of this encounter: 94.348 kg (208 lb). Patient also counseled that weight may inhibit the healing process Patient counseled that losing weight will help with future health issues      [place] Pugh. Terrilee Dudzik   PAC  12/19/2014, 9:15 AM

## 2014-12-20 DIAGNOSIS — E663 Overweight: Secondary | ICD-10-CM | POA: Diagnosis not present

## 2014-12-20 DIAGNOSIS — I25810 Atherosclerosis of coronary artery bypass graft(s) without angina pectoris: Secondary | ICD-10-CM | POA: Diagnosis not present

## 2014-12-20 DIAGNOSIS — I252 Old myocardial infarction: Secondary | ICD-10-CM | POA: Diagnosis not present

## 2014-12-20 DIAGNOSIS — Z471 Aftercare following joint replacement surgery: Secondary | ICD-10-CM | POA: Diagnosis not present

## 2014-12-20 DIAGNOSIS — I6523 Occlusion and stenosis of bilateral carotid arteries: Secondary | ICD-10-CM | POA: Diagnosis not present

## 2014-12-20 DIAGNOSIS — I10 Essential (primary) hypertension: Secondary | ICD-10-CM | POA: Diagnosis not present

## 2014-12-24 NOTE — Discharge Summary (Signed)
Physician Discharge Summary  [name] MRN: [id number] DOB/AGE: 1929/02/09 81 y.o.  Admit date: 12/18/2014 Discharge date: 12/19/2014   Procedures:  Procedure(s) (LRB): TOTAL RIGHT KNEE ARTHROPLASTY (Right)  Attending Physician:  Dr. Paralee Cancel   Admission Diagnoses:   Right knee primary OA / pain  Discharge Diagnoses:  Principal Problem:   S/P right TKA Active Problems:   Overweight (BMI 25.0-29.9)  Past Medical History  Diagnosis Date  . Prostate cancer ([place])   . Hypertension   . Status post coronary artery bypass grafting      intraoperative cholangiogram  . Gallstones   . Diverticulosis     colonic diverticulosis  . Myocardial infarction ([place])   . Stroke [facility])     patient reports no deficits   . Urinary incontinence     bladder leakage - " patient wears a pad"  . GERD (gastroesophageal reflux disease)   . Arthritis     HPI:    Brandon Harris, 81 y.o. male, has a history of pain and functional disability in the right knee due to arthritis and has failed non-surgical conservative treatments for greater than 12 weeks to include NSAID's and/or analgesics, corticosteriod injections, viscosupplementation injections and activity modification. Onset of symptoms was gradual, starting 3-4 years ago with gradually worsening course since that time. The patient noted no past surgery on the right knee(s). Patient currently rates pain in the right knee(s) at 10 out of 10 with activity. Patient has night pain, worsening of pain with activity and weight bearing, pain that interferes with activities of daily living, pain with passive range of motion, crepitus and joint swelling. Patient has evidence of periarticular osteophytes and joint space narrowing by imaging studies. There is no active infection. Risks, benefits and expectations were discussed with the patient. Risks including but not limited to the risk of anesthesia, blood clots, nerve damage, blood vessel  damage, failure of the prosthesis, infection and up to and including death. Patient understand the risks, benefits and expectations and wishes to proceed with surgery.   PCP: Gara Kroner, MD   Discharged Condition: good  Hospital Course:  Patient underwent the above stated procedure on 12/18/2014. Patient tolerated the procedure well and brought to the recovery room in good condition and subsequently to the floor.  POD #1 BP: 147/70 ; Pulse: 70 ; Temp: 97.4 F (36.3 C) ; Resp: 16 Patient reports pain as mild, pain controlled. No events throughout the night. States that he felt comfortable with PT yesterday. Ready to be discharged home if he continues to do well. Dorsiflexion/plantar flexion intact, incision: dressing C/D/I, no cellulitis present and compartment soft.   LABS  Basename    HGB  11.7  HCT  35.6    Discharge Exam: General appearance: alert, cooperative and no distress Extremities: Homans sign is negative, no sign of DVT, no edema, redness or tenderness in the calves or thighs and no ulcers, gangrene or trophic changes  Disposition: Home with follow up in 2 weeks   Follow-up Information    Follow up with Mauri Pole, MD. Schedule an appointment as soon as possible for a visit in 2 weeks.   Specialty:  Orthopedic Surgery   Contact information:   [address] [place] [address] [phone]       Follow up with [facility].   Why:  home health physical therapy   Contact information:   [place]  [address] [phone]  Discharge Instructions    Call MD / Call 911    Complete by:  As directed   If you experience chest pain or shortness of breath, CALL 911 and be transported to the hospital emergency room.  If you develope a fever above 101 F, pus (white drainage) or increased drainage or redness at the wound, or calf pain, call your surgeon's office.     Change dressing    Complete by:  As  directed   Maintain surgical dressing until follow up in the clinic. If the edges start to pull up, may reinforce with tape. If the dressing is no longer working, may remove and cover with gauze and tape, but must keep the area dry and clean.  Call with any questions or concerns.     Constipation Prevention    Complete by:  As directed   Drink plenty of fluids.  Prune juice may be helpful.  You may use a stool softener, such as Colace (over the counter) 100 mg twice a day.  Use MiraLax (over the counter) for constipation as needed.     Diet - low sodium heart healthy    Complete by:  As directed      Discharge instructions    Complete by:  As directed   Maintain surgical dressing until follow up in the clinic. If the edges start to pull up, may reinforce with tape. If the dressing is no longer working, may remove and cover with gauze and tape, but must keep the area dry and clean.  Follow up in 2 weeks at [facility]. Call with any questions or concerns.     Increase activity slowly as tolerated    Complete by:  As directed   Weight bearing as tolerated with assist device (walker, cane, etc) as directed, use it as long as suggested by your surgeon or therapist, typically at least 4-6 weeks.     TED hose    Complete by:  As directed   Use stockings (TED hose) for 2 weeks on both leg(s).  You may remove them at night for sleeping.             Medication List    STOP taking these medications        aspirin 81 MG tablet  Replaced by:  aspirin 325 MG EC tablet     DUREZOL 0.05 % Emul  Generic drug:  Difluprednate      TAKE these medications        ALPRAZolam 0.25 MG tablet  Commonly known as:  XANAX  Take 0.25 mg by mouth at bedtime as needed for anxiety or sleep. TAKE ONE TABLET AT NIGHT     amLODipine 10 MG tablet  Commonly known as:  NORVASC  Take 10 mg by mouth daily.     aspirin 325 MG EC tablet  Take 1 tablet (325 mg total) by mouth 2 (two) times daily.      atorvastatin 80 MG tablet  Commonly known as:  LIPITOR  Take 80 mg by mouth daily.     docusate sodium 100 MG capsule  Commonly known as:  COLACE  Take 1 capsule (100 mg total) by mouth 2 (two) times daily.     ferrous sulfate 325 (65 FE) MG tablet  Take 1 tablet (325 mg total) by mouth 3 (three) times daily after meals.     HYDROcodone-acetaminophen 7.5-325 MG tablet  Commonly known as:  NORCO  Take 1-2 tablets by mouth every  4 (four) hours as needed for moderate pain.     losartan 100 MG tablet  Commonly known as:  COZAAR  Take 100 mg by mouth daily.     mirabegron ER 50 MG Tb24 tablet  Commonly known as:  MYRBETRIQ  Take 50 mg by mouth daily.     omeprazole 20 MG capsule  Commonly known as:  PRILOSEC  Take 20 mg by mouth daily.     polyethylene glycol packet  Commonly known as:  MIRALAX / GLYCOLAX  Take 17 g by mouth 2 (two) times daily.     tamsulosin 0.4 MG Caps capsule  Commonly known as:  FLOMAX  Take 0.8 mg by mouth at bedtime.     tiZANidine 4 MG tablet  Commonly known as:  ZANAFLEX  Take 1 tablet (4 mg total) by mouth every 6 (six) hours as needed for muscle spasms.     Vitamin D3 2000 UNITS Tabs  Take 2,000 Units by mouth daily.     zolpidem 10 MG tablet  Commonly known as:  AMBIEN  Take 10 mg by mouth at bedtime.         Signed: [place]. Gillian Meeuwsen   PA-C  12/24/2014, 9:29 AM

## 2014-12-25 DIAGNOSIS — I252 Old myocardial infarction: Secondary | ICD-10-CM | POA: Diagnosis not present

## 2014-12-25 DIAGNOSIS — I10 Essential (primary) hypertension: Secondary | ICD-10-CM | POA: Diagnosis not present

## 2014-12-25 DIAGNOSIS — E663 Overweight: Secondary | ICD-10-CM | POA: Diagnosis not present

## 2014-12-25 DIAGNOSIS — I6523 Occlusion and stenosis of bilateral carotid arteries: Secondary | ICD-10-CM | POA: Diagnosis not present

## 2014-12-25 DIAGNOSIS — Z471 Aftercare following joint replacement surgery: Secondary | ICD-10-CM | POA: Diagnosis not present

## 2014-12-25 DIAGNOSIS — I25810 Atherosclerosis of coronary artery bypass graft(s) without angina pectoris: Secondary | ICD-10-CM | POA: Diagnosis not present

## 2014-12-26 DIAGNOSIS — E663 Overweight: Secondary | ICD-10-CM | POA: Diagnosis not present

## 2014-12-26 DIAGNOSIS — I252 Old myocardial infarction: Secondary | ICD-10-CM | POA: Diagnosis not present

## 2014-12-26 DIAGNOSIS — I6523 Occlusion and stenosis of bilateral carotid arteries: Secondary | ICD-10-CM | POA: Diagnosis not present

## 2014-12-26 DIAGNOSIS — I10 Essential (primary) hypertension: Secondary | ICD-10-CM | POA: Diagnosis not present

## 2014-12-26 DIAGNOSIS — Z471 Aftercare following joint replacement surgery: Secondary | ICD-10-CM | POA: Diagnosis not present

## 2014-12-26 DIAGNOSIS — I25810 Atherosclerosis of coronary artery bypass graft(s) without angina pectoris: Secondary | ICD-10-CM | POA: Diagnosis not present

## 2014-12-27 DIAGNOSIS — I252 Old myocardial infarction: Secondary | ICD-10-CM | POA: Diagnosis not present

## 2014-12-27 DIAGNOSIS — I10 Essential (primary) hypertension: Secondary | ICD-10-CM | POA: Diagnosis not present

## 2014-12-27 DIAGNOSIS — E663 Overweight: Secondary | ICD-10-CM | POA: Diagnosis not present

## 2014-12-27 DIAGNOSIS — I25810 Atherosclerosis of coronary artery bypass graft(s) without angina pectoris: Secondary | ICD-10-CM | POA: Diagnosis not present

## 2014-12-27 DIAGNOSIS — Z471 Aftercare following joint replacement surgery: Secondary | ICD-10-CM | POA: Diagnosis not present

## 2014-12-27 DIAGNOSIS — I6523 Occlusion and stenosis of bilateral carotid arteries: Secondary | ICD-10-CM | POA: Diagnosis not present

## 2014-12-28 DIAGNOSIS — M1711 Unilateral primary osteoarthritis, right knee: Secondary | ICD-10-CM | POA: Diagnosis not present

## 2015-01-01 DIAGNOSIS — M1711 Unilateral primary osteoarthritis, right knee: Secondary | ICD-10-CM | POA: Diagnosis not present

## 2015-01-02 DIAGNOSIS — Z471 Aftercare following joint replacement surgery: Secondary | ICD-10-CM | POA: Diagnosis not present

## 2015-01-02 DIAGNOSIS — Z96651 Presence of right artificial knee joint: Secondary | ICD-10-CM | POA: Diagnosis not present

## 2015-01-09 DIAGNOSIS — M1711 Unilateral primary osteoarthritis, right knee: Secondary | ICD-10-CM | POA: Diagnosis not present

## 2015-01-15 DIAGNOSIS — M1711 Unilateral primary osteoarthritis, right knee: Secondary | ICD-10-CM | POA: Diagnosis not present

## 2015-01-17 DIAGNOSIS — M1711 Unilateral primary osteoarthritis, right knee: Secondary | ICD-10-CM | POA: Diagnosis not present

## 2015-01-22 DIAGNOSIS — M1711 Unilateral primary osteoarthritis, right knee: Secondary | ICD-10-CM | POA: Diagnosis not present

## 2015-01-24 DIAGNOSIS — M1711 Unilateral primary osteoarthritis, right knee: Secondary | ICD-10-CM | POA: Diagnosis not present

## 2015-01-25 DIAGNOSIS — H35371 Puckering of macula, right eye: Secondary | ICD-10-CM | POA: Diagnosis not present

## 2015-01-25 DIAGNOSIS — H26492 Other secondary cataract, left eye: Secondary | ICD-10-CM | POA: Diagnosis not present

## 2015-01-28 ENCOUNTER — Other Ambulatory Visit: Payer: Self-pay | Admitting: Interventional Cardiology

## 2015-01-28 DIAGNOSIS — I6523 Occlusion and stenosis of bilateral carotid arteries: Secondary | ICD-10-CM

## 2015-01-29 DIAGNOSIS — M1711 Unilateral primary osteoarthritis, right knee: Secondary | ICD-10-CM | POA: Diagnosis not present

## 2015-01-30 DIAGNOSIS — Z96651 Presence of right artificial knee joint: Secondary | ICD-10-CM | POA: Diagnosis not present

## 2015-01-30 DIAGNOSIS — Z471 Aftercare following joint replacement surgery: Secondary | ICD-10-CM | POA: Diagnosis not present

## 2015-01-31 DIAGNOSIS — M1711 Unilateral primary osteoarthritis, right knee: Secondary | ICD-10-CM | POA: Diagnosis not present

## 2015-02-05 DIAGNOSIS — M1711 Unilateral primary osteoarthritis, right knee: Secondary | ICD-10-CM | POA: Diagnosis not present

## 2015-02-07 DIAGNOSIS — M1711 Unilateral primary osteoarthritis, right knee: Secondary | ICD-10-CM | POA: Diagnosis not present

## 2015-02-12 ENCOUNTER — Ambulatory Visit (HOSPITAL_COMMUNITY)
Admission: RE | Admit: 2015-02-12 | Discharge: 2015-02-12 | Disposition: A | Discharge status: Home / Self Care | Payer: Medicare Other | Source: Ambulatory Visit | Attending: Urology | Admitting: Urology

## 2015-02-12 DIAGNOSIS — I10 Essential (primary) hypertension: Secondary | ICD-10-CM | POA: Insufficient documentation

## 2015-02-12 DIAGNOSIS — I6502 Occlusion and stenosis of left vertebral artery: Secondary | ICD-10-CM | POA: Diagnosis not present

## 2015-02-12 DIAGNOSIS — M1711 Unilateral primary osteoarthritis, right knee: Secondary | ICD-10-CM | POA: Diagnosis not present

## 2015-02-12 DIAGNOSIS — I6523 Occlusion and stenosis of bilateral carotid arteries: Secondary | ICD-10-CM | POA: Diagnosis not present

## 2015-02-14 ENCOUNTER — Encounter (HOSPITAL_COMMUNITY): Payer: PRIVATE HEALTH INSURANCE

## 2015-02-14 DIAGNOSIS — H26492 Other secondary cataract, left eye: Secondary | ICD-10-CM | POA: Diagnosis not present

## 2015-02-21 DIAGNOSIS — M1711 Unilateral primary osteoarthritis, right knee: Secondary | ICD-10-CM | POA: Diagnosis not present

## 2015-02-25 DIAGNOSIS — M1711 Unilateral primary osteoarthritis, right knee: Secondary | ICD-10-CM | POA: Diagnosis not present

## 2015-02-28 DIAGNOSIS — M1711 Unilateral primary osteoarthritis, right knee: Secondary | ICD-10-CM | POA: Diagnosis not present

## 2015-03-04 DIAGNOSIS — G4700 Insomnia, unspecified: Secondary | ICD-10-CM | POA: Diagnosis not present

## 2015-03-15 DIAGNOSIS — Z96651 Presence of right artificial knee joint: Secondary | ICD-10-CM | POA: Diagnosis not present

## 2015-03-15 DIAGNOSIS — Z471 Aftercare following joint replacement surgery: Secondary | ICD-10-CM | POA: Diagnosis not present

## 2015-03-18 DIAGNOSIS — N401 Enlarged prostate with lower urinary tract symptoms: Secondary | ICD-10-CM | POA: Diagnosis not present

## 2015-03-18 DIAGNOSIS — Z0000 Encounter for general adult medical examination without abnormal findings: Secondary | ICD-10-CM | POA: Diagnosis not present

## 2015-03-18 DIAGNOSIS — C61 Malignant neoplasm of prostate: Secondary | ICD-10-CM | POA: Diagnosis not present

## 2015-03-18 DIAGNOSIS — R3915 Urgency of urination: Secondary | ICD-10-CM | POA: Diagnosis not present

## 2015-03-23 DIAGNOSIS — M5136 Other intervertebral disc degeneration, lumbar region: Secondary | ICD-10-CM | POA: Diagnosis not present

## 2015-03-23 DIAGNOSIS — M961 Postlaminectomy syndrome, not elsewhere classified: Secondary | ICD-10-CM | POA: Diagnosis not present

## 2015-04-09 DIAGNOSIS — M5416 Radiculopathy, lumbar region: Secondary | ICD-10-CM | POA: Diagnosis not present

## 2015-04-10 DIAGNOSIS — D225 Melanocytic nevi of trunk: Secondary | ICD-10-CM | POA: Diagnosis not present

## 2015-04-10 DIAGNOSIS — Z85828 Personal history of other malignant neoplasm of skin: Secondary | ICD-10-CM | POA: Diagnosis not present

## 2015-04-10 DIAGNOSIS — D1801 Hemangioma of skin and subcutaneous tissue: Secondary | ICD-10-CM | POA: Diagnosis not present

## 2015-04-10 DIAGNOSIS — L821 Other seborrheic keratosis: Secondary | ICD-10-CM | POA: Diagnosis not present

## 2015-04-15 DIAGNOSIS — R0989 Other specified symptoms and signs involving the circulatory and respiratory systems: Secondary | ICD-10-CM | POA: Diagnosis not present

## 2015-04-15 DIAGNOSIS — I129 Hypertensive chronic kidney disease with stage 1 through stage 4 chronic kidney disease, or unspecified chronic kidney disease: Secondary | ICD-10-CM | POA: Diagnosis not present

## 2015-04-15 DIAGNOSIS — F430 Acute stress reaction: Secondary | ICD-10-CM | POA: Diagnosis not present

## 2015-04-15 DIAGNOSIS — G4700 Insomnia, unspecified: Secondary | ICD-10-CM | POA: Diagnosis not present

## 2015-04-15 DIAGNOSIS — N400 Enlarged prostate without lower urinary tract symptoms: Secondary | ICD-10-CM | POA: Diagnosis not present

## 2015-04-15 DIAGNOSIS — N183 Chronic kidney disease, stage 3 (moderate): Secondary | ICD-10-CM | POA: Diagnosis not present

## 2015-04-15 DIAGNOSIS — C61 Malignant neoplasm of prostate: Secondary | ICD-10-CM | POA: Diagnosis not present

## 2015-04-15 DIAGNOSIS — K219 Gastro-esophageal reflux disease without esophagitis: Secondary | ICD-10-CM | POA: Diagnosis not present

## 2015-04-15 DIAGNOSIS — E782 Mixed hyperlipidemia: Secondary | ICD-10-CM | POA: Diagnosis not present

## 2015-04-15 DIAGNOSIS — J309 Allergic rhinitis, unspecified: Secondary | ICD-10-CM | POA: Diagnosis not present

## 2015-05-03 ENCOUNTER — Encounter: Payer: Self-pay | Admitting: Interventional Cardiology

## 2015-05-03 ENCOUNTER — Ambulatory Visit (INDEPENDENT_AMBULATORY_CARE_PROVIDER_SITE_OTHER): Payer: Medicare Other | Admitting: Interventional Cardiology

## 2015-05-03 VITALS — BP 130/58 | HR 68 | Ht 72.0 in | Wt 212.0 lb

## 2015-05-03 DIAGNOSIS — I25810 Atherosclerosis of coronary artery bypass graft(s) without angina pectoris: Secondary | ICD-10-CM | POA: Diagnosis not present

## 2015-05-03 DIAGNOSIS — I10 Essential (primary) hypertension: Secondary | ICD-10-CM | POA: Diagnosis not present

## 2015-05-03 DIAGNOSIS — I6523 Occlusion and stenosis of bilateral carotid arteries: Secondary | ICD-10-CM | POA: Diagnosis not present

## 2015-05-03 DIAGNOSIS — E785 Hyperlipidemia, unspecified: Secondary | ICD-10-CM | POA: Diagnosis not present

## 2015-05-03 DIAGNOSIS — R0609 Other forms of dyspnea: Secondary | ICD-10-CM

## 2015-05-03 NOTE — Progress Notes (Signed)
Cardiology Office Note   Date:  05/03/2015   ID:  Brandon Harris, DOB 19-Jul-1928, MRN [id number]  PCP:  Gara Kroner, MD  Cardiologist:  Sinclair Grooms, MD   Chief Complaint  Patient presents with  . Coronary Artery Disease      History of Present Illness: Brandon Harris is a 82 y.o. male who presents for CABG greater than 15 years ago, no history of bypass graft occlusion, hypertension, hyperlipidemia.  Brandon Harris is doing well. He remains active. He has no cardiac complaints. No functional testing is been done in greater than 10 years. He does have dyspnea on exertion. His been relatively stable. It does prevent him from doing certain things in his yard and garden that he will like to do.    Past Medical History  Diagnosis Date  . Prostate cancer ([place])   . Hypertension   . Status post coronary artery bypass grafting      intraoperative cholangiogram  . Gallstones   . Diverticulosis     colonic diverticulosis  . Myocardial infarction ([place])   . Stroke [facility])     patient reports no deficits   . Urinary incontinence     bladder leakage - " patient wears a pad"  . GERD (gastroesophageal reflux disease)   . Arthritis     Past Surgical History  Procedure Laterality Date  . Prostate cancer    . Rotator cuff repair      LEFT  . Cryotherapy      Prostate Cancer  . Cholecystectomy, laparoscopic    . Coronary artery bypass graft  2001  . Back surgery      x 4, has titanium in back per patient   . Total knee arthroplasty Right 12/18/2014    Procedure: TOTAL RIGHT KNEE ARTHROPLASTY;  Surgeon: Paralee Cancel, MD;  Location: [facility];  Service: Orthopedics;  Laterality: Right;     Current Outpatient Prescriptions  Medication Sig Dispense Refill  . ALPRAZolam (XANAX) 0.25 MG tablet Take 0.25 mg by mouth at bedtime as needed for anxiety or sleep. TAKE ONE TABLET AT NIGHT    . amLODipine (NORVASC) 10 MG tablet Take 10 mg by mouth daily.    Marland Kitchen atorvastatin (LIPITOR) 80 MG tablet  Take 80 mg by mouth daily.    . Cholecalciferol (VITAMIN D3) 2000 UNITS TABS Take 2,000 Units by mouth daily.    Marland Kitchen HYDROcodone-acetaminophen (NORCO) 7.5-325 MG tablet Take 1-2 tablets by mouth every 4 (four) hours as needed for moderate pain. 100 tablet 0  . losartan (COZAAR) 100 MG tablet Take 100 mg by mouth daily.    . mirabegron ER (MYRBETRIQ) 50 MG TB24 tablet Take 50 mg by mouth daily.    Marland Kitchen omeprazole (PRILOSEC) 20 MG capsule Take 20 mg by mouth daily.    Marland Kitchen tiZANidine (ZANAFLEX) 4 MG tablet Take 1 tablet (4 mg total) by mouth every 6 (six) hours as needed for muscle spasms. 40 tablet 0  . zolpidem (AMBIEN) 10 MG tablet Take 10 mg by mouth at bedtime.     No current facility-administered medications for this visit.    Allergies:   Review of patient's allergies indicates no active allergies.    Social History:  The patient  reports that he has never smoked. He does not have any smokeless tobacco history on file. He reports that he does not drink alcohol or use illicit drugs.   Family History:  The patient's family history includes Heart failure in his  mother.    ROS:  Please see the history of present illness.   Otherwise, review of systems are positive for Right knee is stiff following replacement surgery and there is mild edema in the right ankle..   All other systems are reviewed and negative.    PHYSICAL EXAM: VS:  BP 130/58 mmHg  Pulse 68  Ht 6' (1.829 m)  Wt 212 lb (96.163 kg)  BMI 28.75 kg/m2 , BMI Body mass index is 28.75 kg/(m^2). GEN: Well nourished, well developed, in no acute distress HEENT: normal Neck: no JVD, carotid bruits, or masses Cardiac: RRR.  There is no murmur, rub, or gallop. There is  1+ right lower extremity edema. Respiratory:  clear to auscultation bilaterally, normal work of breathing. GI: soft, nontender, nondistended, + BS MS: no deformity or atrophy Skin: warm and dry, no rash Neuro:  Strength and sensation are intact Psych: euthymic mood,  full affect   EKG:  EKG is  ordered today. The ekg reveals normal sinus rhythm, left axis deviation, PACs, poor R-wave progression.  Recent Labs: 12/19/2014: BUN 26*; Creatinine, Ser 1.81*; Hemoglobin 11.7*; Platelets 233; Potassium 4.4; Sodium 137    Lipid Panel No results found for: CHOL, TRIG, HDL, CHOLHDL, VLDL, LDLCALC, LDLDIRECT    Wt Readings from Last 3 Encounters:  05/03/15 212 lb (96.163 kg)  12/18/14 208 lb (94.348 kg)  12/10/14 208 lb (94.348 kg)      Other studies Reviewed: Additional studies/ records that were reviewed today include: Reviewed all records to give evidence of ischemic evaluation.. The findings include a recent ischemic evaluation has been identified..    ASSESSMENT AND PLAN:  1. Coronary artery disease involving coronary bypass graft of native heart without angina pectoris Stable without angina. Bypass surgery was performed 17 years ago.  2. Essential hypertension Well controlled  3. Hyperlipidemia Followed by primary  4. Bilateral carotid artery stenosis Stable by Doppler study in January  5. Dyspnea on exertion, existing now for proximally a year. Rule out an ischemic equivalent  Current medicines are reviewed at length with the patient today.  The patient has the following concerns regarding medicines: None.  The following changes/actions have been instituted:    Pharmacologic myocardial perfusion study (Myoview) to exclude bypass graft occlusionAs a cause for exertional dyspnea   Maintain an active lifestyle  Labs/ tests ordered today include:   Orders Placed This Encounter  Procedures  . Myocardial Perfusion Imaging  . EKG 12-Lead     Disposition:   FU with HS in 1 year  Signed, Sinclair Grooms, MD  05/03/2015 11:59 AM    [facility] [place], [place], [place]  [address] Phone: [phone]; Fax: [phone]

## 2015-05-03 NOTE — Patient Instructions (Signed)
Medication Instructions:   Your physician recommends that you continue on your current medications as directed. Please refer to the Current Medication list given to you today.   If you need a refill on your cardiac medications before your next appointment, please call your pharmacy.  Labwork: NONE ORDER TODAY   Testing/Procedures:  LATER THIS YEAR .Marland KitchenMarland KitchenYour physician has requested that you have a lexiscan myoview. For further information please visit [email or web address]. Please follow instruction sheet, as given.    Follow-Up:  Your physician wants you to follow-up in: [place] will receive a reminder letter in the mail two months in advance. If you don't receive a letter, please call our office to schedule the follow-up appointment.     Any Other Special Instructions Will Be Listed Below (If Applicable).

## 2015-05-14 DIAGNOSIS — E782 Mixed hyperlipidemia: Secondary | ICD-10-CM | POA: Diagnosis not present

## 2015-05-14 DIAGNOSIS — N183 Chronic kidney disease, stage 3 (moderate): Secondary | ICD-10-CM | POA: Diagnosis not present

## 2015-05-14 DIAGNOSIS — I129 Hypertensive chronic kidney disease with stage 1 through stage 4 chronic kidney disease, or unspecified chronic kidney disease: Secondary | ICD-10-CM | POA: Diagnosis not present

## 2015-05-14 DIAGNOSIS — G4700 Insomnia, unspecified: Secondary | ICD-10-CM | POA: Diagnosis not present

## 2015-05-14 DIAGNOSIS — F430 Acute stress reaction: Secondary | ICD-10-CM | POA: Diagnosis not present

## 2015-05-20 ENCOUNTER — Telehealth: Payer: Self-pay | Admitting: Interventional Cardiology

## 2015-05-20 ENCOUNTER — Ambulatory Visit (HOSPITAL_COMMUNITY)
Admission: RE | Admit: 2015-05-20 | Discharge: 2015-05-20 | Disposition: A | Discharge status: Home / Self Care | Payer: Medicare Other | Source: Ambulatory Visit | Attending: Cardiology | Admitting: Cardiology

## 2015-05-20 ENCOUNTER — Other Ambulatory Visit (HOSPITAL_COMMUNITY): Payer: Self-pay | Admitting: Physician Assistant

## 2015-05-20 DIAGNOSIS — Z96651 Presence of right artificial knee joint: Secondary | ICD-10-CM | POA: Diagnosis not present

## 2015-05-20 DIAGNOSIS — Z471 Aftercare following joint replacement surgery: Secondary | ICD-10-CM | POA: Diagnosis not present

## 2015-05-20 DIAGNOSIS — R52 Pain, unspecified: Secondary | ICD-10-CM

## 2015-05-20 DIAGNOSIS — M7989 Other specified soft tissue disorders: Secondary | ICD-10-CM

## 2015-05-20 DIAGNOSIS — I10 Essential (primary) hypertension: Secondary | ICD-10-CM | POA: Insufficient documentation

## 2015-05-20 DIAGNOSIS — K219 Gastro-esophageal reflux disease without esophagitis: Secondary | ICD-10-CM | POA: Insufficient documentation

## 2015-05-20 DIAGNOSIS — M79604 Pain in right leg: Secondary | ICD-10-CM | POA: Insufficient documentation

## 2015-05-20 NOTE — Telephone Encounter (Signed)
I spoke with Barnett Applebaum in [place] at [facility], she will follow up with Loma Sousa at [facility].

## 2015-05-20 NOTE — Telephone Encounter (Signed)
New message      Pt had a doppler for DVT this am.  Calling to get a call report.  Not sure who is going to read it.  Patient sees Dr Tamala Julian but it was ordered thru [facility].  Unable to get Missy or Falecha on the phone

## 2015-05-28 DIAGNOSIS — N183 Chronic kidney disease, stage 3 (moderate): Secondary | ICD-10-CM | POA: Diagnosis not present

## 2015-05-28 DIAGNOSIS — I129 Hypertensive chronic kidney disease with stage 1 through stage 4 chronic kidney disease, or unspecified chronic kidney disease: Secondary | ICD-10-CM | POA: Diagnosis not present

## 2015-05-28 DIAGNOSIS — R600 Localized edema: Secondary | ICD-10-CM | POA: Diagnosis not present

## 2015-05-28 DIAGNOSIS — G4700 Insomnia, unspecified: Secondary | ICD-10-CM | POA: Diagnosis not present

## 2015-06-06 ENCOUNTER — Telehealth (HOSPITAL_COMMUNITY): Payer: Self-pay | Admitting: Radiology

## 2015-06-06 ENCOUNTER — Telehealth (HOSPITAL_COMMUNITY): Payer: Self-pay | Admitting: *Deleted

## 2015-06-06 NOTE — Telephone Encounter (Signed)
Left message on voicemail in reference to upcoming appointment scheduled for 06/10/15. Phone number given for a call back so details instructions can be given. Emauri Krygier J Alston Berrie, RN   

## 2015-06-06 NOTE — Telephone Encounter (Signed)
Patient given detailed instructions per Myocardial Perfusion Study Information Sheet for the test on 06/10/2015 at 10:00. Patient notified to arrive 15 minutes early and that it is imperative to arrive on time for appointment to keep from having the test rescheduled.  If you need to cancel or reschedule your appointment, please call the office within 24 hours of your appointment. Failure to do so may result in a cancellation of your appointment, and a $50 no show fee. Patient verbalized understanding.EHK

## 2015-06-10 ENCOUNTER — Ambulatory Visit (HOSPITAL_COMMUNITY): Payer: Medicare Other | Attending: Cardiovascular Disease

## 2015-06-10 DIAGNOSIS — I779 Disorder of arteries and arterioles, unspecified: Secondary | ICD-10-CM | POA: Diagnosis not present

## 2015-06-10 DIAGNOSIS — R0609 Other forms of dyspnea: Secondary | ICD-10-CM | POA: Diagnosis not present

## 2015-06-10 DIAGNOSIS — I6523 Occlusion and stenosis of bilateral carotid arteries: Secondary | ICD-10-CM | POA: Diagnosis not present

## 2015-06-10 DIAGNOSIS — I25810 Atherosclerosis of coronary artery bypass graft(s) without angina pectoris: Secondary | ICD-10-CM | POA: Diagnosis not present

## 2015-06-10 DIAGNOSIS — I10 Essential (primary) hypertension: Secondary | ICD-10-CM | POA: Diagnosis not present

## 2015-06-10 LAB — MYOCARDIAL PERFUSION IMAGING
LV dias vol: 105 mL (ref 62–150)
LV sys vol: 37 mL
Peak HR: 63 {beats}/min
RATE: 0.24
Rest HR: 63 {beats}/min
SDS: 3
SRS: 4
SSS: 7
TID: 0.92

## 2015-06-10 MED ORDER — TECHNETIUM TC 99M SESTAMIBI GENERIC - CARDIOLITE
30.6000 | Freq: Once | INTRAVENOUS | Status: AC | PRN
  Administered 2015-06-10: 31 via INTRAVENOUS

## 2015-06-10 MED ORDER — TECHNETIUM TC 99M SESTAMIBI GENERIC - CARDIOLITE
10.7000 | Freq: Once | INTRAVENOUS | Status: AC | PRN
  Administered 2015-06-10: 11 via INTRAVENOUS

## 2015-06-10 MED ORDER — REGADENOSON 0.4 MG/5ML IV SOLN
0.4000 mg | Freq: Once | INTRAVENOUS | Status: AC
  Administered 2015-06-10: 0.4 mg via INTRAVENOUS

## 2015-06-14 ENCOUNTER — Telehealth: Payer: Self-pay | Admitting: Interventional Cardiology

## 2015-06-14 NOTE — Telephone Encounter (Signed)
Spoke with pt and informed him of stress test results. Pt verbalized understanding.

## 2015-06-14 NOTE — Telephone Encounter (Signed)
Brandon Harris is returning a call . Please call    Thanks

## 2015-06-19 DIAGNOSIS — M1712 Unilateral primary osteoarthritis, left knee: Secondary | ICD-10-CM | POA: Diagnosis not present

## 2015-06-26 DIAGNOSIS — M1712 Unilateral primary osteoarthritis, left knee: Secondary | ICD-10-CM | POA: Diagnosis not present

## 2015-07-03 DIAGNOSIS — M1712 Unilateral primary osteoarthritis, left knee: Secondary | ICD-10-CM | POA: Diagnosis not present

## 2015-08-01 DIAGNOSIS — M1712 Unilateral primary osteoarthritis, left knee: Secondary | ICD-10-CM | POA: Diagnosis not present

## 2015-09-03 DIAGNOSIS — I129 Hypertensive chronic kidney disease with stage 1 through stage 4 chronic kidney disease, or unspecified chronic kidney disease: Secondary | ICD-10-CM | POA: Diagnosis not present

## 2015-09-03 DIAGNOSIS — R600 Localized edema: Secondary | ICD-10-CM | POA: Diagnosis not present

## 2015-09-03 DIAGNOSIS — N183 Chronic kidney disease, stage 3 (moderate): Secondary | ICD-10-CM | POA: Diagnosis not present

## 2015-09-03 DIAGNOSIS — R3915 Urgency of urination: Secondary | ICD-10-CM | POA: Diagnosis not present

## 2015-09-03 DIAGNOSIS — G4700 Insomnia, unspecified: Secondary | ICD-10-CM | POA: Diagnosis not present

## 2015-09-09 DIAGNOSIS — C61 Malignant neoplasm of prostate: Secondary | ICD-10-CM | POA: Diagnosis not present

## 2015-10-02 DIAGNOSIS — M1712 Unilateral primary osteoarthritis, left knee: Secondary | ICD-10-CM | POA: Diagnosis not present

## 2015-10-16 DIAGNOSIS — L570 Actinic keratosis: Secondary | ICD-10-CM | POA: Diagnosis not present

## 2015-10-16 DIAGNOSIS — C44329 Squamous cell carcinoma of skin of other parts of face: Secondary | ICD-10-CM | POA: Diagnosis not present

## 2015-10-16 DIAGNOSIS — L821 Other seborrheic keratosis: Secondary | ICD-10-CM | POA: Diagnosis not present

## 2015-10-16 DIAGNOSIS — D485 Neoplasm of uncertain behavior of skin: Secondary | ICD-10-CM | POA: Diagnosis not present

## 2015-10-16 DIAGNOSIS — D225 Melanocytic nevi of trunk: Secondary | ICD-10-CM | POA: Diagnosis not present

## 2015-10-16 DIAGNOSIS — L814 Other melanin hyperpigmentation: Secondary | ICD-10-CM | POA: Diagnosis not present

## 2015-10-16 DIAGNOSIS — Z85828 Personal history of other malignant neoplasm of skin: Secondary | ICD-10-CM | POA: Diagnosis not present

## 2015-10-17 DIAGNOSIS — Z23 Encounter for immunization: Secondary | ICD-10-CM | POA: Diagnosis not present

## 2015-10-17 DIAGNOSIS — G4700 Insomnia, unspecified: Secondary | ICD-10-CM | POA: Diagnosis not present

## 2015-10-17 DIAGNOSIS — E782 Mixed hyperlipidemia: Secondary | ICD-10-CM | POA: Diagnosis not present

## 2015-10-17 DIAGNOSIS — I129 Hypertensive chronic kidney disease with stage 1 through stage 4 chronic kidney disease, or unspecified chronic kidney disease: Secondary | ICD-10-CM | POA: Diagnosis not present

## 2015-10-17 DIAGNOSIS — Z1389 Encounter for screening for other disorder: Secondary | ICD-10-CM | POA: Diagnosis not present

## 2015-10-17 DIAGNOSIS — N183 Chronic kidney disease, stage 3 (moderate): Secondary | ICD-10-CM | POA: Diagnosis not present

## 2015-10-17 DIAGNOSIS — F430 Acute stress reaction: Secondary | ICD-10-CM | POA: Diagnosis not present

## 2015-11-01 DIAGNOSIS — Z96651 Presence of right artificial knee joint: Secondary | ICD-10-CM | POA: Diagnosis not present

## 2015-11-01 DIAGNOSIS — Z471 Aftercare following joint replacement surgery: Secondary | ICD-10-CM | POA: Diagnosis not present

## 2015-11-07 DIAGNOSIS — Z8546 Personal history of malignant neoplasm of prostate: Secondary | ICD-10-CM | POA: Diagnosis not present

## 2015-11-27 DIAGNOSIS — C44329 Squamous cell carcinoma of skin of other parts of face: Secondary | ICD-10-CM | POA: Diagnosis not present

## 2016-01-31 DIAGNOSIS — G4700 Insomnia, unspecified: Secondary | ICD-10-CM | POA: Diagnosis not present

## 2016-01-31 DIAGNOSIS — N183 Chronic kidney disease, stage 3 (moderate): Secondary | ICD-10-CM | POA: Diagnosis not present

## 2016-01-31 DIAGNOSIS — E782 Mixed hyperlipidemia: Secondary | ICD-10-CM | POA: Diagnosis not present

## 2016-01-31 DIAGNOSIS — F430 Acute stress reaction: Secondary | ICD-10-CM | POA: Diagnosis not present

## 2016-01-31 DIAGNOSIS — I129 Hypertensive chronic kidney disease with stage 1 through stage 4 chronic kidney disease, or unspecified chronic kidney disease: Secondary | ICD-10-CM | POA: Diagnosis not present

## 2016-02-18 DIAGNOSIS — R6889 Other general symptoms and signs: Secondary | ICD-10-CM | POA: Diagnosis not present

## 2016-03-20 ENCOUNTER — Other Ambulatory Visit: Payer: Self-pay | Admitting: Interventional Cardiology

## 2016-03-20 DIAGNOSIS — I6523 Occlusion and stenosis of bilateral carotid arteries: Secondary | ICD-10-CM

## 2016-03-24 ENCOUNTER — Ambulatory Visit (HOSPITAL_COMMUNITY)
Admission: RE | Admit: 2016-03-24 | Discharge: 2016-03-24 | Disposition: A | Discharge status: Home / Self Care | Payer: Medicare Other | Source: Ambulatory Visit | Attending: Cardiovascular Disease | Admitting: Cardiovascular Disease

## 2016-03-24 DIAGNOSIS — Z951 Presence of aortocoronary bypass graft: Secondary | ICD-10-CM | POA: Insufficient documentation

## 2016-03-24 DIAGNOSIS — I10 Essential (primary) hypertension: Secondary | ICD-10-CM | POA: Insufficient documentation

## 2016-03-24 DIAGNOSIS — I2510 Atherosclerotic heart disease of native coronary artery without angina pectoris: Secondary | ICD-10-CM | POA: Insufficient documentation

## 2016-03-24 DIAGNOSIS — E785 Hyperlipidemia, unspecified: Secondary | ICD-10-CM | POA: Diagnosis not present

## 2016-03-24 DIAGNOSIS — I6523 Occlusion and stenosis of bilateral carotid arteries: Secondary | ICD-10-CM | POA: Diagnosis not present

## 2016-03-24 DIAGNOSIS — Z8673 Personal history of transient ischemic attack (TIA), and cerebral infarction without residual deficits: Secondary | ICD-10-CM | POA: Insufficient documentation

## 2016-04-03 DIAGNOSIS — Z961 Presence of intraocular lens: Secondary | ICD-10-CM | POA: Diagnosis not present

## 2016-04-03 DIAGNOSIS — H524 Presbyopia: Secondary | ICD-10-CM | POA: Diagnosis not present

## 2016-04-03 DIAGNOSIS — H53001 Unspecified amblyopia, right eye: Secondary | ICD-10-CM | POA: Diagnosis not present

## 2016-04-03 DIAGNOSIS — H5000 Unspecified esotropia: Secondary | ICD-10-CM | POA: Diagnosis not present

## 2016-04-14 ENCOUNTER — Encounter: Payer: Self-pay | Admitting: *Deleted

## 2016-04-14 DIAGNOSIS — D485 Neoplasm of uncertain behavior of skin: Secondary | ICD-10-CM | POA: Diagnosis not present

## 2016-04-14 DIAGNOSIS — Z85828 Personal history of other malignant neoplasm of skin: Secondary | ICD-10-CM | POA: Diagnosis not present

## 2016-04-14 DIAGNOSIS — L814 Other melanin hyperpigmentation: Secondary | ICD-10-CM | POA: Diagnosis not present

## 2016-04-14 DIAGNOSIS — D1801 Hemangioma of skin and subcutaneous tissue: Secondary | ICD-10-CM | POA: Diagnosis not present

## 2016-04-14 DIAGNOSIS — L821 Other seborrheic keratosis: Secondary | ICD-10-CM | POA: Diagnosis not present

## 2016-04-14 DIAGNOSIS — L570 Actinic keratosis: Secondary | ICD-10-CM | POA: Diagnosis not present

## 2016-04-21 ENCOUNTER — Telehealth: Payer: Self-pay | Admitting: *Deleted

## 2016-04-21 NOTE — Telephone Encounter (Signed)
Follow Up:    Pt returning call ,concerning his lab results.

## 2016-04-21 NOTE — Telephone Encounter (Signed)
04/21/2016 13:35 LMTCB for results.  TDS

## 2016-04-21 NOTE — Telephone Encounter (Signed)
PT CALLING FOR LAB RESULTS, PLEASE CALL HIM BACK.

## 2016-04-22 ENCOUNTER — Encounter: Payer: Self-pay | Admitting: Interventional Cardiology

## 2016-04-22 NOTE — Telephone Encounter (Signed)
LMTCB for pt 

## 2016-04-22 NOTE — Telephone Encounter (Signed)
This encounter was created in error - please disregard.

## 2016-04-22 NOTE — Telephone Encounter (Signed)
Left message with family member to have pt call back.

## 2016-04-22 NOTE — Telephone Encounter (Signed)
New Message   Pt is calling for his test results, someone tried to call him earlier and he missed the call

## 2016-04-22 NOTE — Telephone Encounter (Signed)
Informed pt of carotid results. Pt verbalized understanding.  

## 2016-05-12 DIAGNOSIS — M1712 Unilateral primary osteoarthritis, left knee: Secondary | ICD-10-CM | POA: Diagnosis not present

## 2016-05-15 ENCOUNTER — Encounter: Payer: Self-pay | Admitting: Interventional Cardiology

## 2016-06-02 NOTE — Progress Notes (Signed)
Cardiology Office Note    Date:  06/03/2016   ID:  Brandon Harris, DOB Aug 06, 1928, MRN [id number]  PCP:  Gara Kroner, MD  Cardiologist: Sinclair Grooms, MD   Chief Complaint  Patient presents with  . Coronary Artery Disease    History of Present Illness:  Brandon Harris is a 83 y.o. male who presents for f/u of CABG greater than 15 years ago, hypertension, hyperlipidemia, and CVA.  Mrs. Edwina Barth has been hospitalized for the past 4 months. This is been very stressful on this to [employer]. She has had influenza, respiratory failure, tracheotomy, nursing home dependency, a major stroke with significant physical limitations, and skilled nursing facility rehabilitation. He has been with her every day during this time frame. His under gone a lot of physical activity. He denies angina, dyspnea, syncope, and swelling. Overall he feels well under the circumstances. He is accompanied by his daughter today.  Past Medical History:  Diagnosis Date  . Arthritis   . Diverticulosis    colonic diverticulosis  . Gallstones   . GERD (gastroesophageal reflux disease)   . Hypertension   . Myocardial infarction ([place])   . Prostate cancer ([place])   . Status post coronary artery bypass grafting     intraoperative cholangiogram  . Stroke [facility])    patient reports no deficits   . Urinary incontinence    bladder leakage - " patient wears a pad"    Past Surgical History:  Procedure Laterality Date  . BACK SURGERY     x 4, has titanium in back per patient   . CHOLECYSTECTOMY, LAPAROSCOPIC    . CORONARY ARTERY BYPASS GRAFT  2001  . CRYOTHERAPY     Prostate Cancer  . prostate cancer    . ROTATOR CUFF REPAIR     LEFT  . TOTAL KNEE ARTHROPLASTY Right 12/18/2014   Procedure: TOTAL RIGHT KNEE ARTHROPLASTY;  Surgeon: Paralee Cancel, MD;  Location: [facility];  Service: Orthopedics;  Laterality: Right;    Current Medications: Outpatient Medications Prior to Visit  Medication Sig Dispense Refill  .  ALPRAZolam (XANAX) 0.25 MG tablet Take 0.25 mg by mouth at bedtime as needed for anxiety or sleep. TAKE ONE TABLET AT NIGHT    . amLODipine (NORVASC) 10 MG tablet Take 10 mg by mouth daily.    Marland Kitchen atorvastatin (LIPITOR) 80 MG tablet Take 80 mg by mouth daily.    . Cholecalciferol (VITAMIN D3) 2000 UNITS TABS Take 2,000 Units by mouth daily.    Marland Kitchen HYDROcodone-acetaminophen (NORCO) 7.5-325 MG tablet Take 1-2 tablets by mouth every 4 (four) hours as needed for moderate pain. 100 tablet 0  . losartan (COZAAR) 100 MG tablet Take 100 mg by mouth daily.    . mirabegron ER (MYRBETRIQ) 50 MG TB24 tablet Take 50 mg by mouth daily.    Marland Kitchen omeprazole (PRILOSEC) 20 MG capsule Take 20 mg by mouth daily.    Marland Kitchen tiZANidine (ZANAFLEX) 4 MG tablet Take 1 tablet (4 mg total) by mouth every 6 (six) hours as needed for muscle spasms. 40 tablet 0  . zolpidem (AMBIEN) 10 MG tablet Take 10 mg by mouth at bedtime.     No facility-administered medications prior to visit.      Allergies:   Patient has no known allergies.   Social History   Social History  . Marital status: Married    Spouse name: N/A  . Number of children: N/A  . Years of education: N/A   Social  History Main Topics  . Smoking status: Never Smoker  . Smokeless tobacco: Never Used  . Alcohol use No  . Drug use: No  . Sexual activity: Not Asked   Other Topics Concern  . None   Social History Narrative  . None     Family History:  The patient's family history includes Heart failure in his mother.   ROS:   Please see the history of present illness.    Anxiety, back pain, muscle pain, and leg pain.  All other systems reviewed and are negative.   PHYSICAL EXAM:   VS:  BP 136/70 (BP Location: Right Arm)   Pulse 69   Ht 5\' 10"  (1.778 m)   Wt 211 lb (95.7 kg)   BMI 30.28 kg/m    GEN: Well nourished, well developed, in no acute distress  HEENT: normal  Neck: no JVD, carotid bruits, or masses Cardiac: RRR; no murmurs, rubs, or gallops,no  edema  Respiratory:  clear to auscultation bilaterally, normal work of breathing GI: soft, nontender, nondistended, + BS MS: no deformity or atrophy  Skin: warm and dry, no rash Neuro:  Alert and Oriented x 3, Strength and sensation are intact Psych: euthymic mood, full affect  Wt Readings from Last 3 Encounters:  06/03/16 211 lb (95.7 kg)  06/10/15 212 lb (96.2 kg)  05/03/15 212 lb (96.2 kg)      Studies/Labs Reviewed:   EKG:  EKG  Sinus rhythm, nonspecific T wave flattening, (axis, occasional PVC, and when compared to prior no significant changes noted.  Recent Labs: No results found for requested labs within last 8760 hours.   Lipid Panel No results found for: CHOL, TRIG, HDL, CHOLHDL, VLDL, LDLCALC, LDLDIRECT  Additional studies/ records that were reviewed today include:  No additional testing has been done recently. Lipids are followed by his primary care.    ASSESSMENT:    1. Coronary artery disease of bypass graft of native heart with stable angina pectoris ([place])   2. Carotid disease, bilateral ([place])   3. Essential hypertension   4. Pure hypercholesterolemia      PLAN:  In order of problems listed above:  1. Stable coronary disease status based on functional capacity and absence of symptoms. No EKG changes to suggest silent infarction. Plan no clinical testing at this time given his age and absence of symptoms with significant physical activity. Clinical follow-up in one year. 2. No carotid bruits are heard. 3. Blood pressure is under good control. We discussed limiting salt in his diet. 4. Lipids are followed by primary care.  Cautioned to call if chest discomfort, dyspnea, swelling, or other signs of heart dysfunction. Otherwise clinical follow-up in one year.  Medication Adjustments/Labs and Tests Ordered: Current medicines are reviewed at length with the patient today.  Concerns regarding medicines are outlined above.  Medication changes, Labs and Tests  ordered today are listed in the Patient Instructions below. Patient Instructions  Continue same medications    Your physician wants you to follow-up in: 1 year. You will receive a reminder letter in the mail two months in advance. If you don't receive a letter, please call our office to schedule the follow-up appointment.     Signed, Sinclair Grooms, MD  06/03/2016 9:08 AM    [facility] [place], [place], [place]  [address] Phone: [phone]; Fax: [phone]

## 2016-06-03 ENCOUNTER — Ambulatory Visit (INDEPENDENT_AMBULATORY_CARE_PROVIDER_SITE_OTHER): Payer: Medicare Other | Admitting: Interventional Cardiology

## 2016-06-03 ENCOUNTER — Encounter: Payer: Self-pay | Admitting: Interventional Cardiology

## 2016-06-03 VITALS — BP 136/70 | HR 69 | Ht 70.0 in | Wt 211.0 lb

## 2016-06-03 DIAGNOSIS — I25708 Atherosclerosis of coronary artery bypass graft(s), unspecified, with other forms of angina pectoris: Secondary | ICD-10-CM | POA: Diagnosis not present

## 2016-06-03 DIAGNOSIS — E7800 Pure hypercholesterolemia, unspecified: Secondary | ICD-10-CM

## 2016-06-03 DIAGNOSIS — I10 Essential (primary) hypertension: Secondary | ICD-10-CM | POA: Diagnosis not present

## 2016-06-03 DIAGNOSIS — I209 Angina pectoris, unspecified: Secondary | ICD-10-CM | POA: Diagnosis not present

## 2016-06-03 DIAGNOSIS — I6523 Occlusion and stenosis of bilateral carotid arteries: Secondary | ICD-10-CM | POA: Diagnosis not present

## 2016-06-03 DIAGNOSIS — I779 Disorder of arteries and arterioles, unspecified: Secondary | ICD-10-CM

## 2016-06-03 DIAGNOSIS — I739 Peripheral vascular disease, unspecified: Secondary | ICD-10-CM

## 2016-06-03 NOTE — Patient Instructions (Signed)
Continue same medications.   Your physician wants you to follow-up in: 1 year.  You will receive a reminder letter in the mail two months in advance. If you don't receive a letter, please call our office to schedule the follow-up appointment.  

## 2016-07-27 DIAGNOSIS — R350 Frequency of micturition: Secondary | ICD-10-CM | POA: Diagnosis not present

## 2016-07-27 DIAGNOSIS — N390 Urinary tract infection, site not specified: Secondary | ICD-10-CM | POA: Diagnosis not present

## 2016-07-31 DIAGNOSIS — Z8546 Personal history of malignant neoplasm of prostate: Secondary | ICD-10-CM | POA: Diagnosis not present

## 2016-08-03 DIAGNOSIS — E782 Mixed hyperlipidemia: Secondary | ICD-10-CM | POA: Diagnosis not present

## 2016-08-03 DIAGNOSIS — I129 Hypertensive chronic kidney disease with stage 1 through stage 4 chronic kidney disease, or unspecified chronic kidney disease: Secondary | ICD-10-CM | POA: Diagnosis not present

## 2016-08-03 DIAGNOSIS — F430 Acute stress reaction: Secondary | ICD-10-CM | POA: Diagnosis not present

## 2016-08-03 DIAGNOSIS — N183 Chronic kidney disease, stage 3 (moderate): Secondary | ICD-10-CM | POA: Diagnosis not present

## 2016-08-03 DIAGNOSIS — R9721 Rising PSA following treatment for malignant neoplasm of prostate: Secondary | ICD-10-CM | POA: Diagnosis not present

## 2016-08-03 DIAGNOSIS — G4700 Insomnia, unspecified: Secondary | ICD-10-CM | POA: Diagnosis not present

## 2016-08-03 DIAGNOSIS — Z8546 Personal history of malignant neoplasm of prostate: Secondary | ICD-10-CM | POA: Diagnosis not present

## 2016-08-03 DIAGNOSIS — Z1389 Encounter for screening for other disorder: Secondary | ICD-10-CM | POA: Diagnosis not present

## 2016-10-14 DIAGNOSIS — Z85828 Personal history of other malignant neoplasm of skin: Secondary | ICD-10-CM | POA: Diagnosis not present

## 2016-10-14 DIAGNOSIS — L821 Other seborrheic keratosis: Secondary | ICD-10-CM | POA: Diagnosis not present

## 2016-10-14 DIAGNOSIS — L814 Other melanin hyperpigmentation: Secondary | ICD-10-CM | POA: Diagnosis not present

## 2016-10-14 DIAGNOSIS — L570 Actinic keratosis: Secondary | ICD-10-CM | POA: Diagnosis not present

## 2016-10-14 DIAGNOSIS — D1801 Hemangioma of skin and subcutaneous tissue: Secondary | ICD-10-CM | POA: Diagnosis not present

## 2016-11-17 DIAGNOSIS — Z23 Encounter for immunization: Secondary | ICD-10-CM | POA: Diagnosis not present

## 2016-12-03 ENCOUNTER — Other Ambulatory Visit: Payer: Self-pay | Admitting: *Deleted

## 2016-12-03 DIAGNOSIS — I6523 Occlusion and stenosis of bilateral carotid arteries: Secondary | ICD-10-CM

## 2017-02-04 DIAGNOSIS — Z8546 Personal history of malignant neoplasm of prostate: Secondary | ICD-10-CM | POA: Diagnosis not present

## 2017-02-11 DIAGNOSIS — N3281 Overactive bladder: Secondary | ICD-10-CM | POA: Diagnosis not present

## 2017-02-11 DIAGNOSIS — R9721 Rising PSA following treatment for malignant neoplasm of prostate: Secondary | ICD-10-CM | POA: Diagnosis not present

## 2017-07-26 IMAGING — NM NM MISC PROCEDURE
3 series · 18 of 18 positions shown · non-contrast
Comparison: none

[Series 1: stress-gsp_(id)_sa · 6.4mm · 6.40mm/px · 6 of 512 frames shown]
[frame 43/512]
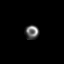
[frame 128/512]
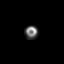
[frame 214/512]
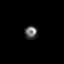
[frame 299/512]
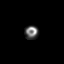
[frame 384/512]
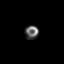
[frame 470/512]
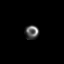

[Series 1: rest_(id)_sa · 6.4mm · 6.40mm/px · 6 of 64 frames shown]
[frame 6/64]
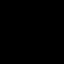
[frame 16/64]
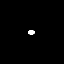
[frame 27/64]
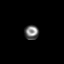
[frame 38/64]
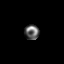
[frame 48/64]
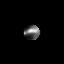
[frame 59/64]
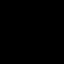

[Series 1: stress-sum-em_(id)_sa · 6.4mm · 6.40mm/px · 6 of 64 frames shown]
[frame 6/64]
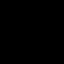
[frame 16/64]
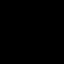
[frame 27/64]
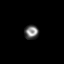
[frame 38/64]
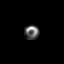
[frame 48/64]
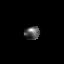
[frame 59/64]
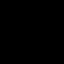

[18 of 18 positions shown; findings below may reference images not displayed]

Canned report from images found in remote index.

Refer to host system for actual result text.

## 2017-08-16 ENCOUNTER — Encounter: Payer: Self-pay | Admitting: Interventional Cardiology

## 2017-08-29 NOTE — Progress Notes (Signed)
Cardiology Office Note:    Date:  08/31/2017   ID:  Brandon Harris, DOB 1928/09/30, MRN [id number]  PCP:  Antony Contras, MD  Cardiologist:  No primary care provider on file.   Referring MD: Antony Contras, MD   Chief Complaint  Patient presents with  . Coronary Artery Disease  . Carotid    History of Present Illness:    Brandon Harris is a 84 y.o. male with a hx of CABG greater than 15 years ago, hypertension, hyperlipidemia, and CVA.  Subxiphoid discomfort occurs when a hernia in that area expands.  It happens 2-3 times per week causing him to lie down on his left side until it retracts.  He denies exertional chest discomfort.  He has not had angina.  No nitroglycerin use, orthopnea, dyspnea on exertion, or claudication has been noted.  He denies transient neurological complaints.  He is accompanied by his daughter who confirms his answers.  Past Medical History:  Diagnosis Date  . Arthritis   . Diverticulosis    colonic diverticulosis  . Gallstones   . GERD (gastroesophageal reflux disease)   . Hypertension   . Myocardial infarction ([place])   . Prostate cancer ([place])   . Status post coronary artery bypass grafting     intraoperative cholangiogram  . Stroke [facility])    patient reports no deficits   . Urinary incontinence    bladder leakage - " patient wears a pad"    Past Surgical History:  Procedure Laterality Date  . BACK SURGERY     x 4, has titanium in back per patient   . CHOLECYSTECTOMY, LAPAROSCOPIC    . CORONARY ARTERY BYPASS GRAFT  2001  . CRYOTHERAPY     Prostate Cancer  . prostate cancer    . ROTATOR CUFF REPAIR     LEFT  . TOTAL KNEE ARTHROPLASTY Right 12/18/2014   Procedure: TOTAL RIGHT KNEE ARTHROPLASTY;  Surgeon: Paralee Cancel, MD;  Location: [facility];  Service: Orthopedics;  Laterality: Right;    Current Medications: Current Meds  Medication Sig  . ALPRAZolam (XANAX) 0.25 MG tablet Take 0.25 mg by mouth at bedtime as needed for anxiety or sleep. TAKE  ONE TABLET AT NIGHT  . amitriptyline (ELAVIL) 10 MG tablet Take two (2) tablets (20 mg) by mouth daily.  Marland Kitchen amLODipine (NORVASC) 10 MG tablet Take 10 mg by mouth daily.  Marland Kitchen aspirin EC 81 MG tablet Take 1 tablet (81 mg total) by mouth daily.  Marland Kitchen atorvastatin (LIPITOR) 80 MG tablet Take 80 mg by mouth daily.  . Cholecalciferol (VITAMIN D3) 2000 UNITS TABS Take 2,000 Units by mouth daily.  Marland Kitchen losartan (COZAAR) 100 MG tablet Take 100 mg by mouth daily.  Marland Kitchen omeprazole (PRILOSEC) 20 MG capsule Take 20 mg by mouth daily.  . tamsulosin (FLOMAX) 0.4 MG CAPS capsule Take two (2) capsules (0.8 mg) by mouth daily.  Marland Kitchen zolpidem (AMBIEN) 10 MG tablet Take 10 mg by mouth at bedtime.     Allergies:   Patient has no known allergies.   Social History   Socioeconomic History  . Marital status: Married    Spouse name: Not on file  . Number of children: Not on file  . Years of education: Not on file  . Highest education level: Not on file  Occupational History  . Not on file  Social Needs  . Financial resource strain: Not on file  . Food insecurity:    Worry: Not on file    Inability:  Not on file  . Transportation needs:    Medical: Not on file    Non-medical: Not on file  Tobacco Use  . Smoking status: Never Smoker  . Smokeless tobacco: Never Used  Substance and Sexual Activity  . Alcohol use: No  . Drug use: No  . Sexual activity: Not on file  Lifestyle  . Physical activity:    Days per week: Not on file    Minutes per session: Not on file  . Stress: Not on file  Relationships  . Social connections:    Talks on phone: Not on file    Gets together: Not on file    Attends religious service: Not on file    Active member of club or organization: Not on file    Attends meetings of clubs or organizations: Not on file    Relationship status: Not on file  Other Topics Concern  . Not on file  Social History Narrative  . Not on file     Family History: The patient's family history includes  Heart failure in his mother.  ROS:   Please see the history of present illness.    Subxiphoid pain when the hernia in that region bulges.  Otherwise no complaints.  All other systems reviewed and are negative.  EKGs/Labs/Other Studies Reviewed:    The following studies were reviewed today:  Bilateral carotid Doppler February 2018: Heterogeneous plaque, bilaterally. Stable 60-79% RICA stenosis, per PSV and IC/CC ratio. Essentially stable 42-59% LICA stenosis, per PSV and IC/CC ratio. >50% RECA stenosis. Normal subclavian arteries, bilaterally. 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  The ekg ordered today demonstrates sinus rhythm, nonspecific T wave flattening, poor R wave progression V1 through V4.  There is no change when compared to prior.  Recent Labs: No results found for requested labs within last 8760 hours.  Recent Lipid Panel No results found for: CHOL, TRIG, HDL, CHOLHDL, VLDL, LDLCALC, LDLDIRECT  Physical Exam:    VS:  BP 134/70   Pulse 71   Ht 5\' 9"  (1.753 m)   Wt 198 lb 6.4 oz (90 kg)   BMI 29.30 kg/m     Wt Readings from Last 3 Encounters:  08/31/17 198 lb 6.4 oz (90 kg)  06/03/16 211 lb (95.7 kg)  06/10/15 212 lb (96.2 kg)     GEN: Elderly with some difficulty with mobility.  Well nourished, well developed in no acute distress HEENT: Normal NECK: No JVD. LYMPHATICS: No lymphadenopathy CARDIAC: RRR, 2/6 right upper sternal border systolic murmur, no gallop, no edema. VASCULAR: Symmetric carotids, radials, and posterior tibial pulses.  Left carotid bruit. RESPIRATORY:  Clear to auscultation without rales, wheezing or rhonchi  ABDOMEN: Soft, non-tender, non-distended, No pulsatile mass, MUSCULOSKELETAL: No deformity  SKIN: Warm and dry NEUROLOGIC:  Alert and oriented x 3 PSYCHIATRIC:  Normal affect   ASSESSMENT:    1. Coronary artery disease of bypass graft of native heart with stable  angina pectoris ([place])   2. Hernia, internal   3. Essential hypertension   4. Bilateral carotid artery stenosis   5. Pure hypercholesterolemia    PLAN:    In order of problems listed above:   1. Stable from cardiac standpoint but if general anesthesia is required can reassess but may need myocardial perfusion imagine depending upon the extent of surgery. 2. Will refer the patient to [facility] surgery for evaluation 3. Blood pressure is adequately  controlled.  Target 140/90 mmHg. 4. Needs to have bilateral carotid Doppler study performed to follow moderate coronary disease noted when last evaluated in 2018. 5. LDL target of 70 with most recent value of 79 in July 2019.  Patient is doing well.  Further evaluation pending results of general surgical consultation if needed.   Medication Adjustments/Labs and Tests Ordered: Current medicines are reviewed at length with the patient today.  Concerns regarding medicines are outlined above.  Orders Placed This Encounter  Procedures  . Ambulatory referral to General Surgery  . EKG 12-Lead   No orders of the defined types were placed in this encounter.   There are no Patient Instructions on file for this visit.   Signed, Sinclair Grooms, MD  08/31/2017 10:25 AM    [place]

## 2017-08-31 ENCOUNTER — Ambulatory Visit (INDEPENDENT_AMBULATORY_CARE_PROVIDER_SITE_OTHER): Payer: Medicare Other | Admitting: Interventional Cardiology

## 2017-08-31 ENCOUNTER — Encounter: Payer: Self-pay | Admitting: Interventional Cardiology

## 2017-08-31 VITALS — BP 134/70 | HR 71 | Ht 69.0 in | Wt 198.4 lb

## 2017-08-31 DIAGNOSIS — E7800 Pure hypercholesterolemia, unspecified: Secondary | ICD-10-CM

## 2017-08-31 DIAGNOSIS — I10 Essential (primary) hypertension: Secondary | ICD-10-CM | POA: Diagnosis not present

## 2017-08-31 DIAGNOSIS — I25708 Atherosclerosis of coronary artery bypass graft(s), unspecified, with other forms of angina pectoris: Secondary | ICD-10-CM | POA: Diagnosis not present

## 2017-08-31 DIAGNOSIS — K458 Other specified abdominal hernia without obstruction or gangrene: Secondary | ICD-10-CM

## 2017-08-31 DIAGNOSIS — I6523 Occlusion and stenosis of bilateral carotid arteries: Secondary | ICD-10-CM | POA: Diagnosis not present

## 2017-08-31 NOTE — Patient Instructions (Signed)
Medication Instructions:  Your physician recommends that you continue on your current medications as directed. Please refer to the Current Medication list given to you today.   Labwork: None  Testing/Procedures: Your physician has requested that you have a carotid duplex. This test is an ultrasound of the carotid arteries in your neck. It looks at blood flow through these arteries that supply the brain with blood. Allow one hour for this exam. There are no restrictions or special instructions.  Follow-Up: You have been referred to [facility], in regards to your hernia.  Your physician wants you to follow-up in: 1 year with Dr. Tamala Julian. You will receive a reminder letter in the mail two months in advance. If you don't receive a letter, please call our office to schedule the follow-up appointment.  If you need a refill on your cardiac medications before your next appointment, please call your pharmacy.

## 2017-09-10 ENCOUNTER — Ambulatory Visit (HOSPITAL_COMMUNITY)
Admission: RE | Admit: 2017-09-10 | Discharge: 2017-09-10 | Disposition: A | Discharge status: Home / Self Care | Payer: Medicare Other | Source: Ambulatory Visit | Attending: Cardiology | Admitting: Cardiology

## 2017-09-10 DIAGNOSIS — I6523 Occlusion and stenosis of bilateral carotid arteries: Secondary | ICD-10-CM | POA: Diagnosis present

## 2017-09-14 ENCOUNTER — Other Ambulatory Visit: Payer: Self-pay

## 2017-09-14 DIAGNOSIS — I25708 Atherosclerosis of coronary artery bypass graft(s), unspecified, with other forms of angina pectoris: Secondary | ICD-10-CM

## 2017-09-14 DIAGNOSIS — I779 Disorder of arteries and arterioles, unspecified: Secondary | ICD-10-CM

## 2017-09-14 DIAGNOSIS — I739 Peripheral vascular disease, unspecified: Secondary | ICD-10-CM

## 2017-10-05 ENCOUNTER — Ambulatory Visit: Payer: Self-pay | Admitting: General Surgery

## 2017-10-07 ENCOUNTER — Telehealth: Payer: Self-pay

## 2017-10-07 NOTE — Telephone Encounter (Signed)
   [facility] Pre-operative Risk Assessment    Request for surgical clearance:  1. What type of surgery is being performed? Hernia Repair   2. When is this surgery scheduled?  TBD   3. What type of clearance is required (medical clearance vs. Pharmacy clearance to hold med vs. Both)?  Both  4. Are there any medications that need to be held prior to surgery and how long?    5. Practice name and name of physician performing surgery? [facility]   6. What is your office phone number [phone]    7.   What is your office fax number [phone]  8.   Anesthesia type (None, local, MAC, general) ? general   Frederik Schmidt 10/07/2017, 4:57 PM  _________________________________________________________________   (provider comments below)

## 2017-10-12 NOTE — Telephone Encounter (Addendum)
   Primary Cardiologist: Sinclair Grooms, MD  Chart reviewed as part of pre-operative protocol coverage. Patient was contacted 10/12/2017 in reference to pre-operative risk assessment for pending surgery as outlined below.  Brandon Harris was last seen on 08/31/17 by Dr. Tamala Julian.   Since that day, Brandon Harris has done well. No cardiac issue. He is getting > 4 mets of activity per [facility]. Will ask Dr. Tamala Julian for need of myocardiac perfusion imaging as noted in office visit note. Last stress test 06/2015 was low risk.  [place], [place] 10/12/2017, 2:26 PM

## 2017-10-14 NOTE — Telephone Encounter (Signed)
Okay to proceed with hernia surgery

## 2017-10-14 NOTE — Telephone Encounter (Signed)
Patient is cleared for surgery without any need to testing. Can hold ASA 5-7 days prior to surgery if needed.    I will route this recommendation to the requesting party via Epic fax function and remove from pre-op pool.  Please call with questions.  [place], [place] 10/14/2017, 1:13 PM

## 2017-10-25 ENCOUNTER — Telehealth: Payer: Self-pay | Admitting: Interventional Cardiology

## 2017-10-25 NOTE — Telephone Encounter (Signed)
° °  Patient states he had an episode of chest pain on 9/15, felt better after taking Nitro. Requesting call from nurse   1. Are you having CP right now? NO  2. Are you experiencing any other symptoms (ex. SOB, nausea, vomiting, sweating)? NO  3. How long have you been experiencing CP? 10/24/17  4. Is your CP continuous or coming and going? CONTINUOUS  5. Have you taken Nitroglycerin? YES ?

## 2017-10-25 NOTE — Telephone Encounter (Signed)
What about the chest pain?? If still hurting needs to go to ER. If resolved needs OV today or tomorrow. If comes back before OV, take NTG and go to ER.

## 2017-10-25 NOTE — Telephone Encounter (Signed)
Spoke with patient about his recent chest pain.  Started: 9/15, he was sitting in his house relaxing in a chair. The chest pain started and was continuous. He denied SOB, fatigue or palpitations. He took 2 nitros and it relieved the pain. This was the first time he took his nitro in about 3 years. At the time his blood pressure was 215/77. The patient took a blood pressure while on the phone it was currently 193/60. He had just taken his blood pressure pills 5 mins before. Advised the patient to take a blood pressure in about an hour and I will call to confirm the value. The patient currently is asymptomatic.   Sending to Dr. Tamala Julian for recommendations.

## 2017-10-25 NOTE — Telephone Encounter (Signed)
Spoke with patient's wife, he advised it was fine to disclose. She accepted an office visit with Leanor Kail, PA on 9/17 1:30pm. She stated if he had any further chest pain, he would take nitro and go to the ED. The patient is still asymptomatic.

## 2017-10-25 NOTE — Telephone Encounter (Signed)
Current BP after medication, 171/71. Patient also confirmed that he only takes 5 mg of amlodipine, he believes it was changed by his PCP.

## 2017-10-26 ENCOUNTER — Ambulatory Visit: Payer: Medicare Other | Admitting: Physician Assistant

## 2017-10-26 ENCOUNTER — Encounter (INDEPENDENT_AMBULATORY_CARE_PROVIDER_SITE_OTHER): Payer: Self-pay

## 2017-10-26 ENCOUNTER — Encounter: Payer: Self-pay | Admitting: Physician Assistant

## 2017-10-26 VITALS — BP 138/70 | HR 84 | Ht 69.0 in | Wt 198.0 lb

## 2017-10-26 DIAGNOSIS — I25708 Atherosclerosis of coronary artery bypass graft(s), unspecified, with other forms of angina pectoris: Secondary | ICD-10-CM

## 2017-10-26 DIAGNOSIS — I10 Essential (primary) hypertension: Secondary | ICD-10-CM

## 2017-10-26 DIAGNOSIS — I200 Unstable angina: Secondary | ICD-10-CM

## 2017-10-26 DIAGNOSIS — I779 Disorder of arteries and arterioles, unspecified: Secondary | ICD-10-CM

## 2017-10-26 DIAGNOSIS — I739 Peripheral vascular disease, unspecified: Secondary | ICD-10-CM

## 2017-10-26 DIAGNOSIS — I25700 Atherosclerosis of coronary artery bypass graft(s), unspecified, with unstable angina pectoris: Secondary | ICD-10-CM | POA: Diagnosis not present

## 2017-10-26 LAB — CBC
Hematocrit: 37.1 % — ABNORMAL LOW (ref 37.5–51.0)
Hemoglobin: 12.6 g/dL — ABNORMAL LOW (ref 13.0–17.7)
MCH: 31 pg (ref 26.6–33.0)
MCHC: 34 g/dL (ref 31.5–35.7)
MCV: 91 fL (ref 79–97)
Platelets: 267 10*3/uL (ref 150–450)
RBC: 4.07 x10E6/uL — ABNORMAL LOW (ref 4.14–5.80)
RDW: 13.1 % (ref 12.3–15.4)
WBC: 8.9 10*3/uL (ref 3.4–10.8)

## 2017-10-26 LAB — BASIC METABOLIC PANEL
BUN/Creatinine Ratio: 14 (ref 10–24)
BUN: 27 mg/dL (ref 8–27)
CO2: 22 mmol/L (ref 20–29)
Calcium: 9 mg/dL (ref 8.6–10.2)
Chloride: 108 mmol/L — ABNORMAL HIGH (ref 96–106)
Creatinine, Ser: 1.87 mg/dL — ABNORMAL HIGH (ref 0.76–1.27)
GFR calc Af Amer: 37 mL/min/{1.73_m2} — ABNORMAL LOW (ref >59)
GFR calc non Af Amer: 32 mL/min/{1.73_m2} — ABNORMAL LOW (ref >59)
Glucose: 119 mg/dL — ABNORMAL HIGH (ref 65–99)
Potassium: 4.7 mmol/L (ref 3.5–5.2)
Sodium: 140 mmol/L (ref 134–144)

## 2017-10-26 MED ORDER — ISOSORBIDE MONONITRATE ER 30 MG PO TB24: 30.0000 mg | ORAL_TABLET | Freq: Every day | ORAL | 3 refills | Status: DC

## 2017-10-26 NOTE — Patient Instructions (Addendum)
Medication Instructions:  Your physician recommends that you schedule a follow-up appointment in:   1. START IMDUR 30 MG DAILY.  2. HOLD LOSARTAN TODAY 9/17 AND TOMORROW 9/18.  Labwork: TODAY: STAT BMET, CBC, BNP  Testing/Procedures: None ordered  Follow-Up: Your physician wants you to follow-up in: 2 [place] APP.  Any Other Special Instructions Will Be Listed Below (If Applicable).     If you need a refill on your cardiac medications before your next appointment, please call your pharmacy.     [place] OFFICE [place], [address] [place] [address] Dept: [phone] Loc: [phone]  Brandon Harris  10/26/2017  You are scheduled for a Cardiac Catheterization on Wednesday, September 18 with Dr. Daneen Schick.  1. Please arrive at the [facility] (Main Entrance A) at [facility]: [address] [place], [place] [address] at 10:00 AM (This time is five hours before your procedure to ensure proper IV hydrdation and preparation). Free valet parking service is available.   Special note: Every effort is made to have your procedure done on time. Please understand that emergencies sometimes delay scheduled procedures.  2. Diet: Do not eat solid foods after midnight.  The patient may have clear liquids until 5am upon the day of the procedure.  3. Labs: You will need to have blood drawn (STAT, BMET, CBC, BNP) on Tuesday, September 17 at [facility]. [address]  Open: 7:30am - 5pm    Phone: [phone]. You do not need to be fasting.  4. Medication instructions in preparation for your procedure:   Contrast Allergy: No   HOLD LOSARTAN TODAY 9/17 AND TOMORROW 9/18  On the morning of your procedure, take your Aspirin and any morning medicines NOT listed above.  You may use sips of water.  5. Plan for one night stay--bring  personal belongings. 6. Bring a current list of your medications and current insurance cards. 7. You MUST have a responsible person to drive you home. 8. Someone MUST be with you the first 24 hours after you arrive home or your discharge will be delayed. 9. Please wear clothes that are easy to get on and off and wear slip-on shoes.  Thank you for allowing [place] to care for you!   -- [place] [place] [facility]

## 2017-10-26 NOTE — Progress Notes (Addendum)
The patient has been seen in conjunction with Brandon Harris, PAC. All aspects of care have been considered and discussed. The patient has been personally interviewed, examined, and all clinical data has been reviewed.   Prolonged chest discomfort in the past 48 hours.  Subsequent dyspnea and orthopnea.  Coronary bypass grafting greater than 15 years ago.  Stage IV CKD places the patient at high risk for contrast-induced kidney injury.  Plan diagnostic cath to define underlying substrate and risk.  Discussed risk of kidney failure, stroke, death, myocardial infarction, limb ischemia, bleeding, among others.  Cardiology Office Note    Date:  10/26/2017   ID:  Brandon Harris, DOB 1928/12/20, MRN [id number]  PCP:  Antony Contras, MD  Cardiologist: Dr. Tamala Julian  Chief Complaint: Chest pain  History of Present Illness:   Brandon Harris is a 84 y.o. male CAD s/p CABG, HTN, HLD and CVA added to my schedule for chest pain.   He had low risk stress test 06/2015.  He was doing well on cardiac stand point when last seen by Dr. Tamala Julian 08/31/17. He is cleared for hernia surgery without any testing. No chest pain at that time.   He had L upper chest pain Sunday while sitting. Described as pressure. No associated dyspnea, nausea, vomiting or radiation.  Episode lasted for 1 to 2 hours eventually resolved with sublingual nitroglycerin x2.  No recurrent chest pain since then.  However last night he had orthopnea while laying down.  No chest pain.  Today he is feeling well.  Cannot recall if symptoms similar to prior angina.  Denies palpitations, dizziness, melena or blood in his stool or urine.  No syncope.  SCr of 2.2 on KPN 08/27/17.  Past Medical History:  Diagnosis Date  . Arthritis   . Diverticulosis    colonic diverticulosis  . Gallstones   . GERD (gastroesophageal reflux disease)   . Hypertension   . Myocardial infarction ([place])   . Prostate cancer ([place])   . Status post coronary artery  bypass grafting     intraoperative cholangiogram  . Stroke [facility])    patient reports no deficits   . Urinary incontinence    bladder leakage - " patient wears a pad"    Past Surgical History:  Procedure Laterality Date  . BACK SURGERY     x 4, has titanium in back per patient   . CHOLECYSTECTOMY, LAPAROSCOPIC    . CORONARY ARTERY BYPASS GRAFT  2001  . CRYOTHERAPY     Prostate Cancer  . prostate cancer    . ROTATOR CUFF REPAIR     LEFT  . TOTAL KNEE ARTHROPLASTY Right 12/18/2014   Procedure: TOTAL RIGHT KNEE ARTHROPLASTY;  Surgeon: Paralee Cancel, MD;  Location: [facility];  Service: Orthopedics;  Laterality: Right;    Current Medications: Prior to Admission medications   Medication Sig Start Date End Date Taking? Authorizing Provider  ALPRAZolam Duanne Moron) 0.25 MG tablet Take 0.25 mg by mouth at bedtime as needed for anxiety or sleep. TAKE ONE TABLET AT NIGHT 02/13/13  Yes [provider]  amitriptyline (ELAVIL) 10 MG tablet Take two (2) tablets (20 mg) by mouth daily. 08/08/17  Yes [provider]  amLODipine (NORVASC) 10 MG tablet Take 10 mg by mouth daily.   Yes [provider]  aspirin EC 81 MG tablet Take 1 tablet (81 mg total) by mouth daily. 06/03/16  Yes Belva Crome, MD  atorvastatin (LIPITOR) 80 MG tablet Take 80 mg  by mouth daily.   Yes [provider]  Cholecalciferol (VITAMIN D3) 2000 UNITS TABS Take 2,000 Units by mouth daily.   Yes [provider]  losartan (COZAAR) 100 MG tablet Take 100 mg by mouth daily.   Yes [provider]  omeprazole (PRILOSEC) 20 MG capsule Take 20 mg by mouth daily.   Yes [provider]  tamsulosin (FLOMAX) 0.4 MG CAPS capsule Take two (2) capsules (0.8 mg) by mouth daily. 08/08/17  Yes [provider]  zolpidem (AMBIEN) 10 MG tablet Take 10 mg by mouth at bedtime.   Yes [provider]    Allergies:   Patient has no known allergies.   Social History   Socioeconomic  History  . Marital status: Married    Spouse name: Not on file  . Number of children: Not on file  . Years of education: Not on file  . Highest education level: Not on file  Occupational History  . Not on file  Social Needs  . Financial resource strain: Not on file  . Food insecurity:    Worry: Not on file    Inability: Not on file  . Transportation needs:    Medical: Not on file    Non-medical: Not on file  Tobacco Use  . Smoking status: Never Smoker  . Smokeless tobacco: Never Used  Substance and Sexual Activity  . Alcohol use: No  . Drug use: No  . Sexual activity: Not on file  Lifestyle  . Physical activity:    Days per week: Not on file    Minutes per session: Not on file  . Stress: Not on file  Relationships  . Social connections:    Talks on phone: Not on file    Gets together: Not on file    Attends religious service: Not on file    Active member of club or organization: Not on file    Attends meetings of clubs or organizations: Not on file    Relationship status: Not on file  Other Topics Concern  . Not on file  Social History Narrative  . Not on file     Family History:  The patient's family history includes Heart failure in his mother.   ROS:   Please see the history of present illness.    ROS All other systems reviewed and are negative.   PHYSICAL EXAM:   VS:  BP 138/70   Pulse 84   Ht 5\' 9"  (1.753 m)   Wt 198 lb (89.8 kg)   BMI 29.24 kg/m    GEN: Well nourished, well developed, in no acute distress  HEENT: normal  Neck: no JVD, carotid bruits, or masses Cardiac: RRR; no murmurs, rubs, or gallops, no  edema  Respiratory:  Faint bibasilar rales  GI: soft, nontender, nondistended, + BS MS: no deformity or atrophy  Skin: warm and dry, no rash Neuro:  Alert and Oriented x 3, Strength and sensation are intact Psych: euthymic mood, full affect  Wt Readings from Last 3 Encounters:  10/26/17 198 lb (89.8 kg)  08/31/17 198 lb 6.4 oz (90 kg)    06/03/16 211 lb (95.7 kg)      Studies/Labs Reviewed:   EKG:  EKG is ordered today.  The ekg ordered today demonstrates sinus rhythm at rate of 84 bpm. TWI in lateral lead which is chronic.   Recent Labs: No results found for requested labs within last 8760 hours.   Lipid Panel No results found for:  CHOL, TRIG, HDL, CHOLHDL, VLDL, LDLCALC, LDLDIRECT  Additional studies/ records that were reviewed today include:   Stress test 06/2015  Nuclear stress EF: 65%.  There was no ST segment deviation noted during stress.  Defect 1: There is a small defect of moderate severity.  The study is normal.  This is a low risk study.   Low risk stress nuclear study with a small apical thinning artifact, otherwise normal perfusion and normal left ventricular regional and global systolic function.  Carotid doppler 09/10/17 Final Interpretation: Right Carotid: Velocities in the right ICA are consistent with a 40-59%        stenosis.        Non-hemodynamically significant plaque <50% noted in the CCA.  Left Carotid: Velocities in the left ICA are consistent with a 1-39% stenosis.       Non-hemodynamically significant plaque noted in the CCA.        The ECA appears >50% stenosed.        Duplex imaging of the brachial arteries shows triphasic waveforms,       bilaterally.  Vertebrals: Left vertebral artery demonstrates an occlusion. Atypical antegrade       right vertebral. Subclavians: Bilateral subclavian artery flow was disturbed.   ASSESSMENT & PLAN:    1. Unstable angina with hx of CAD s/p Remote CABG - His symptoms resolved with SL nitro x 2. No reoccurrence. Low risk stress test in 2017. No angiography since his CABG.  - Continue ASA and statin. Plan for cath. The patient understands that risks include but are not limited to stroke (1 in 1000), death (1 in 91), kidney failure [usually temporary] (1 in 500), bleeding (1 in  200), allergic reaction [possibly serious] (1 in 200), and agrees to proceed.   Noted SCr of 2.2 on KPN. Start Imdur.   2. Orthopnea - Mild faint rales. Check stat BMET and BNP prior to given any lasix or fluids.   3. CKD, presume stage III - Check BMET. Hold Losartan   4. HTN - BP stable.   5. Carotid artery diease - Last study 09/2017 as above  6. Hernia repair - Schedule for 10/3   Medication Adjustments/Labs and Tests Ordered: Current medicines are reviewed at length with the patient today.  Concerns regarding medicines are outlined above.  Medication changes, Labs and Tests ordered today are listed in the Patient Instructions below. Patient Instructions  Medication Instructions:  Your physician recommends that you schedule a follow-up appointment in:   1. START IMDUR 30 MG DAILY.  2. HOLD LOSARTAN TODAY 9/17 AND TOMORROW 9/18.  Labwork: TODAY: STAT BMET, CBC, BNP  Testing/Procedures: None ordered  Follow-Up: Your physician wants you to follow-up in: 2 [place] APP.  Any Other Special Instructions Will Be Listed Below (If Applicable).     If you need a refill on your cardiac medications before your next appointment, please call your pharmacy.     [facility] [place], [address] [place] [address] Dept: [phone] Loc: [phone]  Damarri ERUBIEL MANASCO  10/26/2017  You are scheduled for a Cardiac Catheterization on Wednesday, September 18 with Dr. Daneen Schick.  1. Please arrive at the [facility] (Main Entrance A) at [facility]: [address] [place], [place] [address] at 10:00 AM (This time is five hours before your procedure to ensure proper IV hydrdation and preparation). Free valet parking service is available.  Special note: Every effort is made to have your procedure done on time. Please understand that emergencies sometimes delay  scheduled procedures.  2. Diet: Do not eat solid foods after midnight.  The patient may have clear liquids until 5am upon the day of the procedure.  3. Labs: You will need to have blood drawn (STAT, BMET, CBC, BNP) on Tuesday, September 17 at [facility]. [address]  Open: 7:30am - 5pm    Phone: [phone]. You do not need to be fasting.  4. Medication instructions in preparation for your procedure:   Contrast Allergy: No   HOLD LOSARTAN TODAY 9/17 AND TOMORROW 9/18  On the morning of your procedure, take your Aspirin and any morning medicines NOT listed above.  You may use sips of water.  5. Plan for one night stay--bring personal belongings. 6. Bring a current list of your medications and current insurance cards. 7. You MUST have a responsible person to drive you home. 8. Someone MUST be with you the first 24 hours after you arrive home or your discharge will be delayed. 9. Please wear clothes that are easy to get on and off and wear slip-on shoes.  Thank you for allowing [place] to care for you!   -- [facility] Invasive Cardiovascular services     Signed, Leanor Kail, [place]  10/26/2017 2:25 PM    [place], [place], [place]  [address] Phone: [phone]; Fax: [phone]

## 2017-10-27 ENCOUNTER — Encounter: Payer: Self-pay | Admitting: Physician Assistant

## 2017-10-27 ENCOUNTER — Encounter (HOSPITAL_COMMUNITY)
Admission: RE | Disposition: A | Discharge status: Home / Self Care | Payer: Self-pay | Source: Ambulatory Visit | Attending: Interventional Cardiology

## 2017-10-27 ENCOUNTER — Ambulatory Visit (HOSPITAL_COMMUNITY)
Admission: RE | Admit: 2017-10-27 | Discharge: 2017-10-27 | Disposition: A | Discharge status: Home / Self Care | Payer: Medicare Other | Source: Ambulatory Visit | Attending: Interventional Cardiology | Admitting: Interventional Cardiology

## 2017-10-27 ENCOUNTER — Telehealth: Payer: Self-pay | Admitting: *Deleted

## 2017-10-27 DIAGNOSIS — N183 Chronic kidney disease, stage 3 (moderate): Secondary | ICD-10-CM | POA: Insufficient documentation

## 2017-10-27 DIAGNOSIS — I129 Hypertensive chronic kidney disease with stage 1 through stage 4 chronic kidney disease, or unspecified chronic kidney disease: Secondary | ICD-10-CM | POA: Diagnosis not present

## 2017-10-27 DIAGNOSIS — Z8673 Personal history of transient ischemic attack (TIA), and cerebral infarction without residual deficits: Secondary | ICD-10-CM | POA: Insufficient documentation

## 2017-10-27 DIAGNOSIS — I25119 Atherosclerotic heart disease of native coronary artery with unspecified angina pectoris: Secondary | ICD-10-CM | POA: Diagnosis present

## 2017-10-27 DIAGNOSIS — I25708 Atherosclerosis of coronary artery bypass graft(s), unspecified, with other forms of angina pectoris: Secondary | ICD-10-CM

## 2017-10-27 DIAGNOSIS — I779 Disorder of arteries and arterioles, unspecified: Secondary | ICD-10-CM | POA: Diagnosis present

## 2017-10-27 DIAGNOSIS — I2582 Chronic total occlusion of coronary artery: Secondary | ICD-10-CM | POA: Insufficient documentation

## 2017-10-27 DIAGNOSIS — I739 Peripheral vascular disease, unspecified: Secondary | ICD-10-CM

## 2017-10-27 DIAGNOSIS — Z7982 Long term (current) use of aspirin: Secondary | ICD-10-CM | POA: Insufficient documentation

## 2017-10-27 DIAGNOSIS — Z951 Presence of aortocoronary bypass graft: Secondary | ICD-10-CM | POA: Insufficient documentation

## 2017-10-27 DIAGNOSIS — I25709 Atherosclerosis of coronary artery bypass graft(s), unspecified, with unspecified angina pectoris: Secondary | ICD-10-CM | POA: Diagnosis present

## 2017-10-27 DIAGNOSIS — I252 Old myocardial infarction: Secondary | ICD-10-CM | POA: Diagnosis not present

## 2017-10-27 DIAGNOSIS — N184 Chronic kidney disease, stage 4 (severe): Secondary | ICD-10-CM

## 2017-10-27 DIAGNOSIS — R0609 Other forms of dyspnea: Secondary | ICD-10-CM

## 2017-10-27 DIAGNOSIS — I2584 Coronary atherosclerosis due to calcified coronary lesion: Secondary | ICD-10-CM | POA: Diagnosis not present

## 2017-10-27 DIAGNOSIS — K219 Gastro-esophageal reflux disease without esophagitis: Secondary | ICD-10-CM | POA: Diagnosis not present

## 2017-10-27 DIAGNOSIS — M1990 Unspecified osteoarthritis, unspecified site: Secondary | ICD-10-CM | POA: Diagnosis not present

## 2017-10-27 DIAGNOSIS — I10 Essential (primary) hypertension: Secondary | ICD-10-CM | POA: Diagnosis present

## 2017-10-27 DIAGNOSIS — E785 Hyperlipidemia, unspecified: Secondary | ICD-10-CM | POA: Diagnosis not present

## 2017-10-27 HISTORY — PX: LEFT HEART CATH AND CORS/GRAFTS ANGIOGRAPHY: CATH118250

## 2017-10-27 LAB — BASIC METABOLIC PANEL
Anion gap: 9 (ref 5–15)
BUN: 29 mg/dL — ABNORMAL HIGH (ref 8–23)
CO2: 20 mmol/L — ABNORMAL LOW (ref 22–32)
Calcium: 8.7 mg/dL — ABNORMAL LOW (ref 8.9–10.3)
Chloride: 111 mmol/L (ref 98–111)
Creatinine, Ser: 2.01 mg/dL — ABNORMAL HIGH (ref 0.61–1.24)
GFR calc Af Amer: 33 mL/min — ABNORMAL LOW (ref >60)
GFR calc non Af Amer: 28 mL/min — ABNORMAL LOW (ref >60)
Glucose, Bld: 101 mg/dL — ABNORMAL HIGH (ref 70–99)
Potassium: 4.4 mmol/L (ref 3.5–5.1)
Sodium: 140 mmol/L (ref 135–145)

## 2017-10-27 LAB — PRO B NATRIURETIC PEPTIDE: NT-Pro BNP: 366 pg/mL (ref 0–486)

## 2017-10-27 SURGERY — LEFT HEART CATH AND CORS/GRAFTS ANGIOGRAPHY
Anesthesia: LOCAL

## 2017-10-27 MED ORDER — HEPARIN (PORCINE) IN NACL 1000-0.9 UT/500ML-% IV SOLN
INTRAVENOUS | Status: DC | PRN
  Administered 2017-10-27 (×2): 500 mL

## 2017-10-27 MED ORDER — SODIUM CHLORIDE 0.9% FLUSH: 3.0000 mL | INTRAVENOUS | Status: DC | PRN

## 2017-10-27 MED ORDER — MIDAZOLAM HCL 2 MG/2ML IJ SOLN
INTRAMUSCULAR | Status: AC
  Filled 2017-10-27: qty 2

## 2017-10-27 MED ORDER — CLOPIDOGREL BISULFATE 75 MG PO TABS
75.0000 mg | ORAL_TABLET | Freq: Every day | ORAL | Status: DC
  Filled 2017-10-27: qty 1

## 2017-10-27 MED ORDER — ASPIRIN 81 MG PO CHEW: 81.0000 mg | CHEWABLE_TABLET | ORAL | Status: DC

## 2017-10-27 MED ORDER — LIDOCAINE HCL (PF) 1 % IJ SOLN
INTRAMUSCULAR | Status: DC | PRN
  Administered 2017-10-27: 17 mL via INTRADERMAL

## 2017-10-27 MED ORDER — CLOPIDOGREL BISULFATE 75 MG PO TABS: 75.0000 mg | ORAL_TABLET | Freq: Every day | ORAL | 11 refills | Status: DC

## 2017-10-27 MED ORDER — SODIUM CHLORIDE 0.9 % WEIGHT BASED INFUSION
1.0000 mL/kg/h | INTRAVENOUS | Status: DC
  Administered 2017-10-27 (×2): 1 mL/kg/h via INTRAVENOUS

## 2017-10-27 MED ORDER — SODIUM CHLORIDE 0.9 % IV SOLN: 250.0000 mL | INTRAVENOUS | Status: DC | PRN

## 2017-10-27 MED ORDER — ACETAMINOPHEN 325 MG PO TABS: 650.0000 mg | ORAL_TABLET | ORAL | Status: DC | PRN

## 2017-10-27 MED ORDER — SODIUM CHLORIDE 0.9% FLUSH: 3.0000 mL | Freq: Two times a day (BID) | INTRAVENOUS | Status: DC

## 2017-10-27 MED ORDER — CLOPIDOGREL BISULFATE 75 MG PO TABS
300.0000 mg | ORAL_TABLET | Freq: Once | ORAL | Status: AC
  Administered 2017-10-27: 300 mg via ORAL
  Filled 2017-10-27: qty 4

## 2017-10-27 MED ORDER — FENTANYL CITRATE (PF) 100 MCG/2ML IJ SOLN
INTRAMUSCULAR | Status: AC
  Filled 2017-10-27: qty 2

## 2017-10-27 MED ORDER — OXYCODONE HCL 5 MG PO TABS: 5.0000 mg | ORAL_TABLET | ORAL | Status: DC | PRN

## 2017-10-27 MED ORDER — SODIUM CHLORIDE 0.9 % IV SOLN: INTRAVENOUS | Status: AC

## 2017-10-27 MED ORDER — HEPARIN (PORCINE) IN NACL 1000-0.9 UT/500ML-% IV SOLN
INTRAVENOUS | Status: AC
  Filled 2017-10-27: qty 1000

## 2017-10-27 MED ORDER — MIDAZOLAM HCL 2 MG/2ML IJ SOLN
INTRAMUSCULAR | Status: DC | PRN
  Administered 2017-10-27: 0.5 mg via INTRAVENOUS
  Administered 2017-10-27: 1 mg via INTRAVENOUS

## 2017-10-27 MED ORDER — SODIUM CHLORIDE 0.9 % WEIGHT BASED INFUSION
3.0000 mL/kg/h | INTRAVENOUS | Status: AC
  Administered 2017-10-27: 3 mL/kg/h via INTRAVENOUS

## 2017-10-27 MED ORDER — IOHEXOL 350 MG/ML SOLN
INTRAVENOUS | Status: DC | PRN
  Administered 2017-10-27: 70 mL

## 2017-10-27 MED ORDER — NITROGLYCERIN 0.4 MG SL SUBL: 0.4000 mg | SUBLINGUAL_TABLET | SUBLINGUAL | 12 refills | Status: DC | PRN

## 2017-10-27 MED ORDER — FENTANYL CITRATE (PF) 100 MCG/2ML IJ SOLN
INTRAMUSCULAR | Status: DC | PRN
  Administered 2017-10-27 (×2): 25 ug via INTRAVENOUS

## 2017-10-27 MED ORDER — LIDOCAINE HCL (PF) 1 % IJ SOLN
INTRAMUSCULAR | Status: AC
  Filled 2017-10-27: qty 30

## 2017-10-27 MED ORDER — ONDANSETRON HCL 4 MG/2ML IJ SOLN: 4.0000 mg | Freq: Four times a day (QID) | INTRAMUSCULAR | Status: DC | PRN

## 2017-10-27 SURGICAL SUPPLY — 12 items
CATH INFINITI 5 FR IM (CATHETERS) ×2 IMPLANT
CATH INFINITI 5FR JL4 (CATHETERS) ×2 IMPLANT
CATH INFINITI 5FR MPB2 (CATHETERS) ×2 IMPLANT
GUIDEWIRE ANGLED .035X150CM (WIRE) ×2 IMPLANT
KIT HEART LEFT (KITS) ×2 IMPLANT
PACK CARDIAC CATHETERIZATION (CUSTOM PROCEDURE TRAY) ×2 IMPLANT
SHEATH AVANTI 11CM 5FR (SHEATH) ×2 IMPLANT
SHEATH PROBE COVER 6X72 (BAG) ×2 IMPLANT
TRANSDUCER W/STOPCOCK (MISCELLANEOUS) ×2 IMPLANT
TUBING CIL FLEX 10 FLL-RA (TUBING) ×2 IMPLANT
WIRE EMERALD 3MM-J .035X150CM (WIRE) ×2 IMPLANT
WIRE HI TORQ VERSACORE-J 145CM (WIRE) ×2 IMPLANT

## 2017-10-27 NOTE — Telephone Encounter (Signed)
-----   Message from Belva Crome, MD sent at 10/27/2017  3:24 PM EDT ----- Needs BMET 10/29/2017

## 2017-10-27 NOTE — Telephone Encounter (Signed)
Please have patient come for a basic metabolic panel on Friday, 10/29/2017.

## 2017-10-27 NOTE — Discharge Instructions (Signed)

## 2017-10-27 NOTE — Progress Notes (Signed)
Site area: Right groin a 5 french arterial sheath was removed  Site Prior to Removal:  Level 1-Hematoma  Pressure Applied For 20 MINUTES    Bedrest Beginning at 1515p  Manual:   Yes.    Patient Status During Pull:  stable  Post Pull Groin Site:  Level 0  Post Pull Instructions Given:  Yes.    Post Pull Pulses Present:  Yes.    Dressing Applied:  Yes.    Comments:  VS remain stable

## 2017-10-28 ENCOUNTER — Telehealth: Payer: Self-pay | Admitting: Interventional Cardiology

## 2017-10-28 ENCOUNTER — Encounter (HOSPITAL_COMMUNITY): Payer: Self-pay | Admitting: Interventional Cardiology

## 2017-10-28 NOTE — Telephone Encounter (Signed)
Spoke with pt and he is aware to come tomorrow for labs.

## 2017-10-28 NOTE — Telephone Encounter (Signed)
New Message        Abigail Butts with [facility] is calling to see how long the patient should hold the plavix for his surgery? Pls call and advise.

## 2017-10-28 NOTE — Telephone Encounter (Addendum)
Will route to Dr. Tamala Julian for advisement.  Pt having laparoscopic hernia repair on 12/08/17.

## 2017-10-29 ENCOUNTER — Other Ambulatory Visit: Payer: Medicare Other | Admitting: *Deleted

## 2017-10-29 ENCOUNTER — Telehealth: Payer: Self-pay | Admitting: *Deleted

## 2017-10-29 DIAGNOSIS — N184 Chronic kidney disease, stage 4 (severe): Secondary | ICD-10-CM

## 2017-10-29 LAB — BASIC METABOLIC PANEL
BUN/Creatinine Ratio: 13 (ref 10–24)
BUN: 29 mg/dL — ABNORMAL HIGH (ref 8–27)
CO2: 20 mmol/L (ref 20–29)
Calcium: 8.8 mg/dL (ref 8.6–10.2)
Chloride: 107 mmol/L — ABNORMAL HIGH (ref 96–106)
Creatinine, Ser: 2.18 mg/dL — ABNORMAL HIGH (ref 0.76–1.27)
GFR calc Af Amer: 31 mL/min/{1.73_m2} — ABNORMAL LOW (ref >59)
GFR calc non Af Amer: 26 mL/min/{1.73_m2} — ABNORMAL LOW (ref >59)
Glucose: 90 mg/dL (ref 65–99)
Potassium: 4.7 mmol/L (ref 3.5–5.2)
Sodium: 140 mmol/L (ref 134–144)

## 2017-10-29 NOTE — Telephone Encounter (Signed)
Spoke with pt and went over results.  Pt agreeable to come back on Wednesday for labs.

## 2017-10-29 NOTE — Telephone Encounter (Signed)
-----   Message from Belva Crome, MD sent at 10/29/2017  5:27 PM EDT ----- Let the patient know stay off of losartan.  Recheck basic metabolic panel on Wednesday.  Kidney numbers are only slightly worse.  We cannot resume losartan until after kidney numbers returned to baseline. A copy will be sent to Antony Contras, MD

## 2017-11-02 NOTE — Telephone Encounter (Signed)
They should hold off on surgery until current recent angina is stabilized.He is now on Plavix.

## 2017-11-02 NOTE — Telephone Encounter (Signed)
Recommendations faxed to [facility].

## 2017-11-03 ENCOUNTER — Other Ambulatory Visit: Payer: Medicare Other | Admitting: *Deleted

## 2017-11-03 DIAGNOSIS — N184 Chronic kidney disease, stage 4 (severe): Secondary | ICD-10-CM

## 2017-11-04 LAB — BASIC METABOLIC PANEL
BUN/Creatinine Ratio: 12 (ref 10–24)
BUN: 25 mg/dL (ref 8–27)
CO2: 19 mmol/L — ABNORMAL LOW (ref 20–29)
Calcium: 9 mg/dL (ref 8.6–10.2)
Chloride: 106 mmol/L (ref 96–106)
Creatinine, Ser: 2.02 mg/dL — ABNORMAL HIGH (ref 0.76–1.27)
GFR calc Af Amer: 34 mL/min/{1.73_m2} — ABNORMAL LOW (ref >59)
GFR calc non Af Amer: 29 mL/min/{1.73_m2} — ABNORMAL LOW (ref >59)
Glucose: 105 mg/dL — ABNORMAL HIGH (ref 65–99)
Potassium: 4.6 mmol/L (ref 3.5–5.2)
Sodium: 141 mmol/L (ref 134–144)

## 2017-11-05 ENCOUNTER — Other Ambulatory Visit: Payer: Self-pay | Admitting: *Deleted

## 2017-11-05 DIAGNOSIS — N184 Chronic kidney disease, stage 4 (severe): Secondary | ICD-10-CM

## 2017-11-05 DIAGNOSIS — I10 Essential (primary) hypertension: Secondary | ICD-10-CM

## 2017-11-09 ENCOUNTER — Ambulatory Visit (INDEPENDENT_AMBULATORY_CARE_PROVIDER_SITE_OTHER): Payer: Medicare Other | Admitting: Physician Assistant

## 2017-11-09 ENCOUNTER — Encounter: Payer: Self-pay | Admitting: Physician Assistant

## 2017-11-09 VITALS — BP 148/60 | HR 74 | Ht 71.0 in | Wt 196.1 lb

## 2017-11-09 DIAGNOSIS — K458 Other specified abdominal hernia without obstruction or gangrene: Secondary | ICD-10-CM | POA: Diagnosis not present

## 2017-11-09 DIAGNOSIS — E785 Hyperlipidemia, unspecified: Secondary | ICD-10-CM

## 2017-11-09 DIAGNOSIS — I25708 Atherosclerosis of coronary artery bypass graft(s), unspecified, with other forms of angina pectoris: Secondary | ICD-10-CM | POA: Diagnosis not present

## 2017-11-09 DIAGNOSIS — N184 Chronic kidney disease, stage 4 (severe): Secondary | ICD-10-CM

## 2017-11-09 NOTE — Progress Notes (Signed)
Cardiology Office Note    Date:  11/09/2017   ID:  ROBERTT BUDA, DOB 1928/08/19, MRN [id number]  PCP:  Antony Contras, MD  Cardiologist:  Dr. Tamala Julian   Chief Complaint: 2 weeks follow up  History of Present Illness:   Brandon Harris is a 84 y.o. male CAD s/p CABG, HTN, HLD, CKD stage IV, carotid disease and CVA presents for follow up.   He had low risk stress test 06/2015.  Seen by me and Dr. Tamala Julian 10/2017 for symptoms concerning for [place] which resolved with SL nitro. Started on Imdur. Follow up cath showed patent grafts however The LAD beyond the saphenous vein graft insertion into the mid to distal LAD contains calcified relatively focal 95% stenosis. Dr. Tamala Julian recommended PTCA once kidney recovers and if symptoms warrants. Hold hernia repair. Losartan on hold until kidney function recovers.   Here today for follow up. No recurrent symptoms. He did yard work all day yesterday without any reproducible symptoms. The patient denies nausea, vomiting, fever, chest pain, palpitations, shortness of breath, orthopnea, PND, dizziness, syncope, cough, congestion, abdominal pain, hematochezia, melena, lower extremity edema. No hernia issue.    Past Medical History:  Diagnosis Date  . Arthritis   . Carotid arterial disease ([place])   . Chronic kidney disease (CKD), stage IV (severe) ([place])   . Diverticulosis    colonic diverticulosis  . Gallstones   . GERD (gastroesophageal reflux disease)   . Hypertension   . Myocardial infarction ([place])   . Prostate cancer ([place])   . Status post coronary artery bypass grafting     intraoperative cholangiogram  . Stroke [facility])    patient reports no deficits   . Urinary incontinence    bladder leakage - " patient wears a pad"    Past Surgical History:  Procedure Laterality Date  . BACK SURGERY     x 4, has titanium in back per patient   . CHOLECYSTECTOMY, LAPAROSCOPIC    . CORONARY ARTERY BYPASS GRAFT  2001  . CRYOTHERAPY     Prostate Cancer  . LEFT  HEART CATH AND CORS/GRAFTS ANGIOGRAPHY N/A 10/27/2017   Procedure: LEFT HEART CATH AND CORS/GRAFTS ANGIOGRAPHY;  Surgeon: Belva Crome, MD;  Location: [facility];  Service: Cardiovascular;  Laterality: N/A;  . prostate cancer    . ROTATOR CUFF REPAIR     LEFT  . TOTAL KNEE ARTHROPLASTY Right 12/18/2014   Procedure: TOTAL RIGHT KNEE ARTHROPLASTY;  Surgeon: Paralee Cancel, MD;  Location: [facility];  Service: Orthopedics;  Laterality: Right;    Current Medications: Prior to Admission medications   Medication Sig Start Date End Date Taking? Authorizing Provider  ALPRAZolam Duanne Moron) 0.25 MG tablet Take 0.25 mg by mouth at bedtime as needed for anxiety or sleep. TAKE ONE TABLET AT NIGHT 02/13/13   [provider]  amitriptyline (ELAVIL) 10 MG tablet Take 20 mg by mouth at bedtime. Take two (2) tablets (20 mg) by mouth daily. 08/08/17   [provider]  amLODipine (NORVASC) 10 MG tablet Take 10 mg by mouth daily.    [provider]  aspirin EC 81 MG tablet Take 1 tablet (81 mg total) by mouth daily. 06/03/16   Belva Crome, MD  atorvastatin (LIPITOR) 80 MG tablet Take 80 mg by mouth daily.    [provider]  Cholecalciferol (VITAMIN D3) 2000 UNITS TABS Take 2,000 Units by mouth daily.    [provider]  clopidogrel (PLAVIX) 75 MG tablet Take  1 tablet (75 mg total) by mouth daily. 10/27/17 10/27/18  Belva Crome, MD  isosorbide mononitrate (IMDUR) 30 MG 24 hr tablet Take 1 tablet (30 mg total) by mouth daily. 10/26/17 01/24/18  Leanor Kail, PA  nitroGLYCERIN (NITROSTAT) 0.4 MG SL tablet Place 1 tablet (0.4 mg total) under the tongue every 5 (five) minutes as needed for chest pain. 10/27/17   Belva Crome, MD  omeprazole (PRILOSEC) 20 MG capsule Take 20 mg by mouth daily.    [provider]  tamsulosin (FLOMAX) 0.4 MG CAPS capsule Take 0.8 mg by mouth daily. Take two (2) capsules (0.8 mg) by mouth daily.  08/08/17   [provider]    zolpidem (AMBIEN) 10 MG tablet Take 10 mg by mouth at bedtime.    [provider]    Allergies:   Patient has no known allergies.   Social History   Socioeconomic History  . Marital status: Married    Spouse name: Not on file  . Number of children: Not on file  . Years of education: Not on file  . Highest education level: Not on file  Occupational History  . Not on file  Social Needs  . Financial resource strain: Not on file  . Food insecurity:    Worry: Not on file    Inability: Not on file  . Transportation needs:    Medical: Not on file    Non-medical: Not on file  Tobacco Use  . Smoking status: Never Smoker  . Smokeless tobacco: Never Used  Substance and Sexual Activity  . Alcohol use: No  . Drug use: No  . Sexual activity: Not on file  Lifestyle  . Physical activity:    Days per week: Not on file    Minutes per session: Not on file  . Stress: Not on file  Relationships  . Social connections:    Talks on phone: Not on file    Gets together: Not on file    Attends religious service: Not on file    Active member of club or organization: Not on file    Attends meetings of clubs or organizations: Not on file    Relationship status: Not on file  Other Topics Concern  . Not on file  Social History Narrative  . Not on file     Family History:  The patient's family history includes Heart failure in his mother.   ROS:   Please see the history of present illness.    ROS All other systems reviewed and are negative.   PHYSICAL EXAM:   VS:  BP (!) 148/60   Pulse 74   Ht 5\' 11"  (1.803 m)   Wt 196 lb 1.9 oz (89 kg)   SpO2 99%   BMI 27.35 kg/m    GEN: Well nourished, well developed, in no acute distress  HEENT: normal  Neck: no JVD, carotid bruits, or masses Cardiac: RRR; no murmurs, rubs, or gallops,no edema  Respiratory:  clear to auscultation bilaterally, normal work of breathing GI: soft, nontender, nondistended, + BS MS: no deformity or  atrophy  Skin: warm and dry, no rash Neuro:  Alert and Oriented x 3, Strength and sensation are intact Psych: euthymic mood, full affect  Wt Readings from Last 3 Encounters:  11/09/17 196 lb 1.9 oz (89 kg)  10/27/17 199 lb (90.3 kg)  10/26/17 198 lb (89.8 kg)      Studies/Labs Reviewed:   EKG:  EKG is not ordered today.  Recent Labs: 10/26/2017: Hemoglobin 12.6; NT-Pro BNP 366; Platelets 267 11/03/2017: BUN 25; Creatinine, Ser 2.02; Potassium 4.6; Sodium 141   Lipid Panel No results found for: CHOL, TRIG, HDL, CHOLHDL, VLDL, LDLCALC, LDLDIRECT  Additional studies/ records that were reviewed today include:   LEFT HEART CATH AND CORS/GRAFTS ANGIOGRAPHY 10/27/17  Conclusion    Widely patent bypass grafts including sequential saphenous vein graft to PDA and PL, saphenous vein graft to OM, saphenous vein graft to LAD, and LIMA to OM1.  Severe native vessel disease including heavy diffuse calcification in all territories.  Segmental 50% left main.  Ostial occlusion of the right coronary.  Total occlusion of the mid LAD after the first diagonal.  The LAD beyond the saphenous vein graft insertion into the mid to distal LAD contains calcified relatively focal 95% stenosis.  Total occlusion of the proximal circumflex.  Normal LVEDP  RECOMMENDATIONS:   Continue medical therapy  Four additional hours of IV hydration prior to discharge.  Kidney function on Friday.  Plavix 75 mg/day.  Consider PTCA of LAD via the saphenous vein graft once kidneys recover and if symptoms warrant.    Diagnostic Diagram       Carotid doppler 09/10/17 Final Interpretation: Right Carotid: Velocities in the right ICA are consistent with a 40-59%        stenosis.        Non-hemodynamically significant plaque <50% noted in the CCA.  Left Carotid: Velocities in the left ICA are consistent with a 1-39% stenosis.       Non-hemodynamically significant plaque noted in  the CCA.        The ECA appears >50% stenosed.        Duplex imaging of the brachial arteries shows triphasic waveforms,       bilaterally.  Vertebrals: Left vertebral artery demonstrates an occlusion. Atypical antegrade       right vertebral. Subclavians: Bilateral subclavian artery flow was disturbed.   ASSESSMENT & PLAN:    1. CAD s/p CABG - Cath as above. Plan for PTCA of mid/distal LAD if recurrent symptoms (reviwed with Dr. Tamala Julian) Continue Plavix, statin and Imdur.   2. CKD stage IV - Recheck labs next week as schedule. Losartan on hold. Will refer to [place] [facility].   3. HTN - BP minimally elevated. Continue Norvasc 10mg  daily and Imdur 30mg  daily. Losartan on hold as above. He is not in BB (does not recall ever been on). No change in therapy. He will periodically check his blood pressure and let us know if elevated.   4. HLD - LDL 79. Continue statin.   5. Hernia  - Surgery on hold. He has not issue since last visit. Advise to avoid extraneous activity.    Medication Adjustments/Labs and Tests Ordered: Current medicines are reviewed at length with the patient today.  Concerns regarding medicines are outlined above.  Medication changes, Labs and Tests ordered today are listed in the Patient Instructions below. Patient Instructions  Medication Instructions:  Your physician recommends that you continue on your current medications as directed. Please refer to the Current Medication list given to you today.   Labwork: None ordered  Testing/Procedures: None ordered   You have been referred to [facility].  THEY WILL CALL YOU WITH AN APPT.  Follow-Up: Your physician recommends that you schedule a follow-up appointment in: 4 MONTHS WITH DR. Mechele Dawley   Any Other Special Instructions Will Be Listed Below (If Applicable).     If you need a  refill on your cardiac medications before your next appointment, please call  your pharmacy.      Jarrett Soho, [place]  11/09/2017 11:24 AM    [facility] [place], [place], [place]  [address] Phone: [phone]; Fax: [phone]

## 2017-11-09 NOTE — Patient Instructions (Addendum)
Medication Instructions:  Your physician recommends that you continue on your current medications as directed. Please refer to the Current Medication list given to you today.   Labwork: None ordered  Testing/Procedures: None ordered   You have been referred to [facility].  THEY WILL CALL YOU WITH AN APPT.  Follow-Up: Your physician recommends that you schedule a follow-up appointment in: 4 MONTHS WITH DR. Mechele Dawley   Any Other Special Instructions Will Be Listed Below (If Applicable).     If you need a refill on your cardiac medications before your next appointment, please call your pharmacy.

## 2017-11-15 ENCOUNTER — Other Ambulatory Visit: Payer: Medicare Other | Admitting: *Deleted

## 2017-11-15 DIAGNOSIS — N184 Chronic kidney disease, stage 4 (severe): Secondary | ICD-10-CM

## 2017-11-15 DIAGNOSIS — I10 Essential (primary) hypertension: Secondary | ICD-10-CM

## 2017-11-16 ENCOUNTER — Telehealth: Payer: Self-pay | Admitting: Interventional Cardiology

## 2017-11-16 LAB — BASIC METABOLIC PANEL
BUN/Creatinine Ratio: 11 (ref 10–24)
BUN: 23 mg/dL (ref 8–27)
CO2: 20 mmol/L (ref 20–29)
Calcium: 9.1 mg/dL (ref 8.6–10.2)
Chloride: 106 mmol/L (ref 96–106)
Creatinine, Ser: 2.03 mg/dL — ABNORMAL HIGH (ref 0.76–1.27)
GFR calc Af Amer: 33 mL/min/{1.73_m2} — ABNORMAL LOW (ref >59)
GFR calc non Af Amer: 29 mL/min/{1.73_m2} — ABNORMAL LOW (ref >59)
Glucose: 123 mg/dL — ABNORMAL HIGH (ref 65–99)
Potassium: 4.6 mmol/L (ref 3.5–5.2)
Sodium: 141 mmol/L (ref 134–144)

## 2017-11-16 NOTE — Telephone Encounter (Signed)
Walk in pt [employer] paper dropped off pt needs completed. Placed in Dr. Tamala Julian doc box.

## 2017-11-19 ENCOUNTER — Telehealth: Payer: Self-pay | Admitting: *Deleted

## 2017-11-19 MED ORDER — HYDRALAZINE HCL 25 MG PO TABS: 25.0000 mg | ORAL_TABLET | Freq: Two times a day (BID) | ORAL | 5 refills | Status: DC | PRN

## 2017-11-19 NOTE — Telephone Encounter (Signed)
-----   Message from Belva Crome, MD sent at 11/16/2017  9:45 PM EDT ----- Let the patient know shw should stay off Losartan, measure BP, and use Hydralazine 25 mg PO BID if BP > 150/90 mmHg. Needs f/u visit with me in 1 month. A copy will be sent to Antony Contras, MD

## 2017-11-19 NOTE — Telephone Encounter (Signed)
Spoke with pt and went over results and recommendations.  Pt verbalized understanding and was in agreement with this plan.  Scheduled pt to see Dr. Tamala Julian 11/13.

## 2017-12-06 ENCOUNTER — Encounter (HOSPITAL_COMMUNITY): Payer: Self-pay | Admitting: Obstetrics and Gynecology

## 2017-12-06 ENCOUNTER — Emergency Department (HOSPITAL_COMMUNITY)
Admission: EM | Admit: 2017-12-06 | Discharge: 2017-12-06 | Disposition: A | Discharge status: Home / Self Care | Payer: Medicare Other | Attending: Emergency Medicine | Admitting: Emergency Medicine

## 2017-12-06 ENCOUNTER — Other Ambulatory Visit: Payer: Self-pay

## 2017-12-06 ENCOUNTER — Emergency Department (HOSPITAL_COMMUNITY): Payer: Medicare Other

## 2017-12-06 DIAGNOSIS — I2510 Atherosclerotic heart disease of native coronary artery without angina pectoris: Secondary | ICD-10-CM | POA: Diagnosis not present

## 2017-12-06 DIAGNOSIS — S0990XA Unspecified injury of head, initial encounter: Secondary | ICD-10-CM | POA: Diagnosis present

## 2017-12-06 DIAGNOSIS — Z7982 Long term (current) use of aspirin: Secondary | ICD-10-CM | POA: Insufficient documentation

## 2017-12-06 DIAGNOSIS — Z8673 Personal history of transient ischemic attack (TIA), and cerebral infarction without residual deficits: Secondary | ICD-10-CM | POA: Diagnosis not present

## 2017-12-06 DIAGNOSIS — Z79899 Other long term (current) drug therapy: Secondary | ICD-10-CM | POA: Diagnosis not present

## 2017-12-06 DIAGNOSIS — S0101XA Laceration without foreign body of scalp, initial encounter: Secondary | ICD-10-CM | POA: Diagnosis not present

## 2017-12-06 DIAGNOSIS — Z951 Presence of aortocoronary bypass graft: Secondary | ICD-10-CM | POA: Diagnosis not present

## 2017-12-06 DIAGNOSIS — Z7901 Long term (current) use of anticoagulants: Secondary | ICD-10-CM | POA: Diagnosis not present

## 2017-12-06 DIAGNOSIS — N184 Chronic kidney disease, stage 4 (severe): Secondary | ICD-10-CM | POA: Diagnosis not present

## 2017-12-06 DIAGNOSIS — Y9389 Activity, other specified: Secondary | ICD-10-CM | POA: Diagnosis not present

## 2017-12-06 DIAGNOSIS — I129 Hypertensive chronic kidney disease with stage 1 through stage 4 chronic kidney disease, or unspecified chronic kidney disease: Secondary | ICD-10-CM | POA: Insufficient documentation

## 2017-12-06 DIAGNOSIS — W228XXA Striking against or struck by other objects, initial encounter: Secondary | ICD-10-CM | POA: Insufficient documentation

## 2017-12-06 DIAGNOSIS — Y999 Unspecified external cause status: Secondary | ICD-10-CM | POA: Diagnosis not present

## 2017-12-06 DIAGNOSIS — Y92008 Other place in unspecified non-institutional (private) residence as the place of occurrence of the external cause: Secondary | ICD-10-CM | POA: Diagnosis not present

## 2017-12-06 MED ORDER — LIDOCAINE HCL (PF) 1 % IJ SOLN
5.0000 mL | Freq: Once | INTRAMUSCULAR | Status: AC
  Administered 2017-12-06: 5 mL
  Filled 2017-12-06: qty 30

## 2017-12-06 NOTE — ED Notes (Signed)
Laceration to head repaired using sutures by provider

## 2017-12-06 NOTE — ED Provider Notes (Signed)
Brandon Harris Provider Note   CSN: [id number] Arrival date & time: 12/06/17  1642     History   Chief Complaint Chief Complaint  Patient presents with  . Fall  . Head Injury    On Bloodthinners    HPI Brandon Harris is a 84 y.o. male.  HPI Patient was working in his garage and he lost his balance hitting his head on a 2 x 4 that was leaning against the wall.  Reports he did fall to the ground.  He did not have loss of consciousness.  He denies headache or dizziness.  He did get a cut on his scalp and it bled all over the place.  Ports he is also got a little scrape on his arm.  He was able to get up and walk.  No chest abdomen back hip lower extremity pain.  Patient does take Plavix. Past Medical History:  Diagnosis Date  . Arthritis   . Carotid arterial disease ([place])   . Chronic kidney disease (CKD), stage IV (severe) ([place])   . Diverticulosis    colonic diverticulosis  . Gallstones   . GERD (gastroesophageal reflux disease)   . Hypertension   . Myocardial infarction ([place])   . Prostate cancer ([place])   . Status post coronary artery bypass grafting     intraoperative cholangiogram  . Stroke [facility])    patient reports no deficits   . Urinary incontinence    bladder leakage - " patient wears a pad"    Patient Active Problem List   Diagnosis Date Noted  . CKD (chronic kidney disease) stage 4, GFR 15-29 ml/min (HCC) 10/27/2017  . Dyspnea on exertion 05/03/2015  . Overweight (BMI 25.0-29.9) 12/19/2014  . S/P right TKA 12/18/2014  . Carotid disease, bilateral ([place]) 02/28/2013  . CAD (coronary artery disease) of artery bypass graft 02/28/2013  . Essential hypertension 02/28/2013  . Hyperlipidemia 02/28/2013    Past Surgical History:  Procedure Laterality Date  . BACK SURGERY     x 4, has titanium in back per patient   . CHOLECYSTECTOMY, LAPAROSCOPIC    . CORONARY ARTERY BYPASS GRAFT  2001  . CRYOTHERAPY     Prostate Cancer  . LEFT  HEART CATH AND CORS/GRAFTS ANGIOGRAPHY N/A 10/27/2017   Procedure: LEFT HEART CATH AND CORS/GRAFTS ANGIOGRAPHY;  Surgeon: Belva Crome, MD;  Location: [place] CV LAB;  Service: Cardiovascular;  Laterality: N/A;  . prostate cancer    . ROTATOR CUFF REPAIR     LEFT  . TOTAL KNEE ARTHROPLASTY Right 12/18/2014   Procedure: TOTAL RIGHT KNEE ARTHROPLASTY;  Surgeon: Paralee Cancel, MD;  Location: [facility];  Service: Orthopedics;  Laterality: Right;        Home Medications    Prior to Admission medications   Medication Sig Start Date End Date Taking? Authorizing Provider  amitriptyline (ELAVIL) 10 MG tablet Take 20 mg by mouth at bedtime. Take two (2) tablets (20 mg) by mouth daily. 08/08/17  Yes [provider]  amLODipine (NORVASC) 10 MG tablet Take 10 mg by mouth daily.   Yes [provider]  aspirin EC 81 MG tablet Take 1 tablet (81 mg total) by mouth daily. 06/03/16  Yes Belva Crome, MD  atorvastatin (LIPITOR) 80 MG tablet Take 80 mg by mouth every evening.    Yes [provider]  cefUROXime (CEFTIN) 250 MG tablet Take 250 mg by mouth daily.  12/02/17  Yes [provider]  Cholecalciferol (  VITAMIN D3) 2000 UNITS TABS Take 2,000 Units by mouth daily.   Yes [provider]  clopidogrel (PLAVIX) 75 MG tablet Take 1 tablet (75 mg total) by mouth daily. 10/27/17 10/27/18 Yes Belva Crome, MD  famotidine (PEPCID) 20 MG tablet Take 20 mg by mouth at bedtime.   Yes [provider]  hydrALAZINE (APRESOLINE) 25 MG tablet Take 1 tablet (25 mg total) by mouth 2 (two) times daily as needed. For blood pressure 150/90 or higher Patient taking differently: Take 25 mg by mouth 2 (two) times daily. For blood pressure 150/90 or higher 11/19/17  Yes Belva Crome, MD  isosorbide mononitrate (IMDUR) 30 MG 24 hr tablet Take 1 tablet (30 mg total) by mouth daily. 10/26/17 01/24/18 Yes Bhagat, Bhavinkumar, PA  tamsulosin (FLOMAX) 0.4 MG CAPS capsule Take 0.8 mg  by mouth daily. Take two (2) capsules (0.8 mg) by mouth daily.  08/08/17  Yes [provider]  ALPRAZolam Duanne Moron) 0.25 MG tablet Take 0.25 mg by mouth at bedtime as needed for anxiety or sleep. TAKE ONE TABLET AT NIGHT 02/13/13   [provider]  nitroGLYCERIN (NITROSTAT) 0.4 MG SL tablet Place 1 tablet (0.4 mg total) under the tongue every 5 (five) minutes as needed for chest pain. 10/27/17   Belva Crome, MD    Family History Family History  Problem Relation Age of Onset  . Heart failure Mother     Social History Social History   Tobacco Use  . Smoking status: Never Smoker  . Smokeless tobacco: Never Used  Substance Use Topics  . Alcohol use: No  . Drug use: No     Allergies   Patient has no known allergies.   Review of Systems Review of Systems 10 Systems reviewed and are negative for acute change except as noted in the HPI.   Physical Exam Updated Vital Signs BP (!) 159/76   Pulse (!) 54   Resp (!) 40   Ht 5\' 10"  (1.778 m)   Wt 91.6 kg   SpO2 98%   BMI 28.98 kg/m   Physical Exam  Constitutional: He is oriented to person, place, and time.  Patient is alert and nontoxic.  Mental status clear.  HENT:  Mouth/Throat: Oropharynx is clear and moist.  5 cm linear laceration to the crown of the head on the left.  No significant gaping.  No active bleeding.  No surrounding hematoma.  Eyes: Pupils are equal, round, and reactive to light. EOM are normal.  Neck: Neck supple.  No C-spine tenderness  Cardiovascular:  Irregularly irregular.  No gross rub murmur gallop  Pulmonary/Chest: Effort normal and breath sounds normal. He exhibits no tenderness.  Abdominal: Soft. He exhibits no distension. There is no tenderness. There is no guarding.  Musculoskeletal:  Normal range of motion lower extremities with no deformity or contusion or abrasion.  Very superficial abrasion to the left shoulder.  Normal range of motion at baseline with pre-existing stiff and  somewhat limited right shoulder motion.  Extensive of arthritic changes of hands.  Neurological: He is alert and oriented to person, place, and time. No cranial nerve deficit. He exhibits normal muscle tone. Coordination normal.  Skin: Skin is warm and dry.  Psychiatric: He has a normal mood and affect.     ED Treatments / Results  Labs (all labs ordered are listed, but only abnormal results are displayed) Labs Reviewed - No data to display  EKG None  Radiology Ct Head Wo Contrast  Result Date: 12/06/2017 CLINICAL DATA:  Fall today hitting head. EXAM: CT HEAD WITHOUT CONTRAST TECHNIQUE: Contiguous axial images were obtained from the base of the skull through the vertex without intravenous contrast. COMPARISON:  None. FINDINGS: Brain: Ventricles, cisterns and other CSF spaces are within normal. There is chronic ischemic microvascular disease. Minimal age related atrophic change is present. There is no mass, mass effect, shift of midline structures or acute hemorrhage. No evidence of acute infarction. Vascular: No hyperdense vessel or unexpected calcification. Skull: Normal. Negative for fracture or focal lesion. Sinuses/Orbits: Orbits are normal. Paranasal sinuses are well aerated with very minimal opacification over the left posterior ethmoid air cells. Mastoid air cells are clear. Other: None. IMPRESSION: No acute findings. Mild chronic ischemic microvascular disease and age related atrophic change. Electronically Signed   By: Marin Olp M.D.   On: 12/06/2017 19:43    Procedures .Marland KitchenLaceration Repair Date/Time: 12/06/2017 8:58 PM Performed by: Charlesetta Shanks, MD Authorized by: Charlesetta Shanks, MD   Consent:    Consent obtained:  Verbal   Consent given by:  Patient   Risks discussed:  Infection, need for additional repair, pain, poor cosmetic result and poor wound healing Anesthesia (see MAR for exact dosages):    Anesthesia method:  Local infiltration   Local anesthetic:   Lidocaine 1% w/o epi Laceration details:    Location:  Scalp   Scalp location:  Crown   Length (cm):  5   Depth (mm):  4 Repair type:    Repair type:  Intermediate Pre-procedure details:    Preparation:  Patient was prepped and draped in usual sterile fashion Exploration:    Wound extent: areolar tissue violated     Contaminated: no   Treatment:    Area cleansed with:  Shur-Clens   Amount of cleaning:  Standard Skin repair:    Repair method:  Sutures   Suture size:  4-0   Suture material:  Nylon   Suture technique:  Running   Number of sutures:  7 Approximation:    Approximation:  Close Post-procedure details:    Dressing:  Antibiotic ointment   (including critical care time)  Medications Ordered in ED Medications  lidocaine (PF) (XYLOCAINE) 1 % injection 5 mL (5 mLs Infiltration Given 12/06/17 1812)     Initial Impression / Assessment and Plan / ED Course  I have reviewed the triage vital signs and the nursing notes.  Pertinent labs & imaging results that were available during my care of the patient were reviewed by me and considered in my medical decision making (see chart for details).    Patient with minor head injury on Plavix.  CT negative.  No neurologic dysfunction.  Normal mental status.  Scalp laceration cleaned and repaired as outlined.  Patient discharged in good condition.  He is with his family member who will be assisting him.  Final Clinical Impressions(s) / ED Diagnoses   Final diagnoses:  Injury of head, initial encounter  Laceration of scalp, initial encounter    ED Discharge Orders    None       Charlesetta Shanks, MD 12/06/17 2100

## 2017-12-06 NOTE — ED Notes (Addendum)
Pt is alert and oriented x 4 and is verbally responsive. 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 Pt denies LOC and sustained Laceration to left side of head, abrasion noted to left shoulder and to rt forearm, area cleansed, Bleeding controlled pt denies HA, blurred vision, or dizziness. Pt denies pain at this time. Pt daughter is at bedside.

## 2017-12-06 NOTE — ED Triage Notes (Signed)
Pt reports he fell and hit a 2X4 with his head. Pt was working in his shop and hit his head. Pt denies dizziness or LOC. Pt reports the pain "ain't bad" Pt is on blood thinners. Pt reports he takes plavix.

## 2017-12-08 ENCOUNTER — Encounter (HOSPITAL_COMMUNITY): Admission: RE | Payer: Self-pay | Source: Ambulatory Visit

## 2017-12-08 ENCOUNTER — Ambulatory Visit (HOSPITAL_COMMUNITY): Admission: RE | Admit: 2017-12-08 | Payer: Medicare Other | Source: Ambulatory Visit | Admitting: General Surgery

## 2017-12-08 SURGERY — REPAIR, HERNIA, INCISIONAL, LAPAROSCOPIC
Anesthesia: General

## 2017-12-21 NOTE — Progress Notes (Signed)
Cardiology Office Note:    Date:  12/22/2017   ID:  Brandon Harris, DOB Aug 24, 1928, MRN [id number]  PCP:  Antony Contras, MD  Cardiologist:  Sinclair Grooms, MD   Referring MD: Antony Contras, MD   Chief Complaint  Patient presents with  . Coronary Artery Disease    Pectoris    History of Present Illness:    Brandon Harris is a 84 y.o. male with a hx of  CAD s/p CABG, HTN, HLD, CKD stage IV, carotid disease and CVA presents for follow up. Cath performed 10/27/2017 demonstrates severe LAD beyond LIMA insertion responding to medical therapy.  Having chest tightness or pressure but does have more dyspnea on exertion now than he had prior to the anginal episodes that led to coronary angiography 2 months ago..  ARB therapy was discontinued after cath because of significant increase in creatinine with low contrast exposure.  Medications were adjusted.  He is now on hydralazine and isosorbide.  He notices dyspnea on exertion with moderate walking or heavy physical activity.  He states that his current physical abilities are acceptable.  He has not used nitroglycerin.  He would like to use percutaneous intervention only as a bailout procedure if symptoms become excessive.  Occasional skipped heartbeat.  No blood in the urine or stool.  Plavix was started because of acute coronary syndrome/unstable angina which now seems to be stabilizing on medical therapy.  I do believe dyspnea on exertion is an anginal equivalent.  I described this to the patient and advised him to use nitroglycerin if dyspnea occurs at rest or does not resolve with rest.  He had a recent fall while working in his shop and hit his head on a 2 x 4 opening a large laceration on his head.  Subsequently, he has done okay.  I reviewed the emergency room notes would suggest that he lost his balance.  Today the patient cannot remember if he lost his balance or if he could have fainted.  There was no premonitory symptom.  No recurrent  problems over the past 2 weeks.  Past Medical History:  Diagnosis Date  . Arthritis   . Carotid arterial disease ([place])   . Chronic kidney disease (CKD), stage IV (severe) ([place])   . Diverticulosis    colonic diverticulosis  . Gallstones   . GERD (gastroesophageal reflux disease)   . Hypertension   . Myocardial infarction ([place])   . Prostate cancer ([place])   . Status post coronary artery bypass grafting     intraoperative cholangiogram  . Stroke [facility])    patient reports no deficits   . Urinary incontinence    bladder leakage - " patient wears a pad"    Past Surgical History:  Procedure Laterality Date  . BACK SURGERY     x 4, has titanium in back per patient   . CHOLECYSTECTOMY, LAPAROSCOPIC    . CORONARY ARTERY BYPASS GRAFT  2001  . CRYOTHERAPY     Prostate Cancer  . LEFT HEART CATH AND CORS/GRAFTS ANGIOGRAPHY N/A 10/27/2017   Procedure: LEFT HEART CATH AND CORS/GRAFTS ANGIOGRAPHY;  Surgeon: Belva Crome, MD;  Location: [place] CV LAB;  Service: Cardiovascular;  Laterality: N/A;  . prostate cancer    . ROTATOR CUFF REPAIR     LEFT  . TOTAL KNEE ARTHROPLASTY Right 12/18/2014   Procedure: TOTAL RIGHT KNEE ARTHROPLASTY;  Surgeon: Paralee Cancel, MD;  Location: [facility];  Service: Orthopedics;  Laterality: Right;  Current Medications: Current Meds  Medication Sig  . ALPRAZolam (XANAX) 0.25 MG tablet Take 0.25 mg by mouth at bedtime as needed for anxiety or sleep. TAKE ONE TABLET AT NIGHT  . amitriptyline (ELAVIL) 10 MG tablet Take 20 mg by mouth at bedtime. Take two (2) tablets (20 mg) by mouth daily.  Marland Kitchen amLODipine (NORVASC) 10 MG tablet Take 10 mg by mouth daily.  Marland Kitchen aspirin EC 81 MG tablet Take 1 tablet (81 mg total) by mouth daily.  Marland Kitchen atorvastatin (LIPITOR) 80 MG tablet Take 80 mg by mouth every evening.   . Cholecalciferol (VITAMIN D3) 2000 UNITS TABS Take 2,000 Units by mouth daily.  . clopidogrel (PLAVIX) 75 MG tablet Take 1 tablet (75 mg total) by mouth daily.  .  famotidine (PEPCID) 20 MG tablet Take 20 mg by mouth at bedtime.  . hydrALAZINE (APRESOLINE) 25 MG tablet Take 25 mg by mouth 2 (two) times daily as needed (For BP 150/90 or higher).  . isosorbide mononitrate (IMDUR) 30 MG 24 hr tablet Take 1 tablet (30 mg total) by mouth daily.  . nitroGLYCERIN (NITROSTAT) 0.4 MG SL tablet Place 1 tablet (0.4 mg total) under the tongue every 5 (five) minutes as needed for chest pain.  . tamsulosin (FLOMAX) 0.4 MG CAPS capsule Take 0.8 mg by mouth daily. Take two (2) capsules (0.8 mg) by mouth daily.      Allergies:   Patient has no known allergies.   Social History   Socioeconomic History  . Marital status: Married    Spouse name: Not on file  . Number of children: Not on file  . Years of education: Not on file  . Highest education level: Not on file  Occupational History  . Not on file  Social Needs  . Financial resource strain: Not on file  . Food insecurity:    Worry: Not on file    Inability: Not on file  . Transportation needs:    Medical: Not on file    Non-medical: Not on file  Tobacco Use  . Smoking status: Never Smoker  . Smokeless tobacco: Never Used  Substance and Sexual Activity  . Alcohol use: No  . Drug use: No  . Sexual activity: Not Currently  Lifestyle  . Physical activity:    Days per week: Not on file    Minutes per session: Not on file  . Stress: Not on file  Relationships  . Social connections:    Talks on phone: Not on file    Gets together: Not on file    Attends religious service: Not on file    Active member of club or organization: Not on file    Attends meetings of clubs or organizations: Not on file    Relationship status: Not on file  Other Topics Concern  . Not on file  Social History Narrative  . Not on file     Family History: The patient's family history includes Heart failure in his mother.  ROS:   Please see the history of present illness.    Recently fell while out in his shop.  He cannot  really explain whether he lost consciousness or not.  He did go to the emergency room and required scalp stitches.  All other systems reviewed and are negative.  EKGs/Labs/Other Studies Reviewed:    The following studies were reviewed today:   EKG:  EKG is not ordered today.  Recent Labs: 10/26/2017: Hemoglobin 12.6; NT-Pro BNP 366; Platelets 267 11/15/2017: BUN  23; Creatinine, Ser 2.03; Potassium 4.6; Sodium 141  Recent Lipid Panel No results found for: CHOL, TRIG, HDL, CHOLHDL, VLDL, LDLCALC, LDLDIRECT  Physical Exam:    VS:  BP 124/62   Pulse 85   Ht 5\' 10"  (1.778 m)   Wt 197 lb 12.8 oz (89.7 kg)   SpO2 97%   BMI 28.38 kg/m     Wt Readings from Last 3 Encounters:  12/22/17 197 lb 12.8 oz (89.7 kg)  12/06/17 202 lb (91.6 kg)  11/09/17 196 lb 1.9 oz (89 kg)     GEN:  Well nourished, well developed in no acute distress HEENT: Normal NECK: No JVD. LYMPHATICS: No lymphadenopathy CARDIAC: RRR, no,murmur, no gallop, no edema. VASCULAR: Plus bilateral radial and carotid pulses.  No bruits. RESPIRATORY:  Clear to auscultation without rales, wheezing or rhonchi  ABDOMEN: Soft, non-tender, non-distended, No pulsatile mass, MUSCULOSKELETAL: No deformity  SKIN: Warm and dry NEUROLOGIC:  Alert and oriented x 3 PSYCHIATRIC:  Normal affect   ASSESSMENT:    1. Coronary artery disease involving coronary bypass graft of native heart with angina pectoris ([place])   2. Bilateral carotid artery stenosis   3. CKD (chronic kidney disease) stage 4, GFR 15-29 ml/min (HCC)   4. Hyperlipidemia LDL goal <70   5. Essential hypertension    PLAN:    In order of problems listed above:  1. Exertional dyspnea new since the September development of angina.  Likely represents an anginal equivalent.  Discussed management strategies with the patient relative to symptoms.  He feels her quality of life is adequate even with current restrictions.  He has not needed to use nitroglycerin.  He has modified  his exertional efforts.  He prefer to avoid re-exposure to contrast and save interventional procedures as a bailout if he develops refractory symptoms.  This is reasonable especially when risk benefit analysis is taken into consideration. 2. Asymptomatic 3. Creatinine around 2.0 with significant bump in creatinine after low-volume contrast exposure in September.  Continue clinical observation.  Already followed chronically by nephrology. 4. Continue aggressive risk modification with high intensity Lipitor to achieve LDL goal of less than 70. 5. 130/80 mmHg or less. 6. Recent fall with head trauma.  Difficult to tell if he fainted/had loss of consciousness or stumble.  He cannot recall exactly what happened now due to difficulty with memory.  Plan risk factor modification for secondary risk prevention: Overall education and awareness concerning primary/secondary risk prevention was discussed in detail: LDL less than 70, hemoglobin A1c less than 7, blood pressure target less than 130/80 mmHg, >150 minutes of moderate aerobic activity per week, avoidance of smoking, weight control (via diet and exercise), and continued surveillance/management of/for obstructive sleep apnea.  Is nitroglycerin if symptoms of dyspnea occur spontaneously or or prolonged and not relieved by rest.  Notify us if nitroglycerin use is required.  Clinical check in 4 months by APP and in 6 to 9 months by Dr. Tamala Julian.   Medication Adjustments/Labs and Tests Ordered: Current medicines are reviewed at length with the patient today.  Concerns regarding medicines are outlined above.  No orders of the defined types were placed in this encounter.  No orders of the defined types were placed in this encounter.   There are no Patient Instructions on file for this visit.   Signed, Sinclair Grooms, MD  12/22/2017 10:17 AM    [place]

## 2017-12-22 ENCOUNTER — Ambulatory Visit: Payer: Medicare Other | Admitting: Interventional Cardiology

## 2017-12-22 ENCOUNTER — Encounter: Payer: Self-pay | Admitting: Interventional Cardiology

## 2017-12-22 VITALS — BP 124/62 | HR 85 | Ht 70.0 in | Wt 197.8 lb

## 2017-12-22 DIAGNOSIS — E785 Hyperlipidemia, unspecified: Secondary | ICD-10-CM

## 2017-12-22 DIAGNOSIS — I10 Essential (primary) hypertension: Secondary | ICD-10-CM

## 2017-12-22 DIAGNOSIS — N184 Chronic kidney disease, stage 4 (severe): Secondary | ICD-10-CM | POA: Diagnosis not present

## 2017-12-22 DIAGNOSIS — I6523 Occlusion and stenosis of bilateral carotid arteries: Secondary | ICD-10-CM

## 2017-12-22 DIAGNOSIS — I25709 Atherosclerosis of coronary artery bypass graft(s), unspecified, with unspecified angina pectoris: Secondary | ICD-10-CM

## 2017-12-22 NOTE — Patient Instructions (Signed)
Medication Instructions:  No change If you need a refill on your cardiac medications before your next appointment, please call your pharmacy.   Lab work: none If you have labs (blood work) drawn today and your tests are completely normal, you will receive your results only by: Marland Kitchen MyChart Message (if you have MyChart) OR . A paper copy in the mail If you have any lab test that is abnormal or we need to change your treatment, we will call you to review the results.  Testing/Procedures: none  Follow-Up: . Your physician recommends that you schedule a follow-up appointment in: 4 months with Dr. Tamala Julian or Advanced Practice Provider on Dr. Thompson Caul care team.   Any Other Special Instructions Will Be Listed Below (If Applicable). none

## 2018-01-04 ENCOUNTER — Other Ambulatory Visit: Payer: Self-pay | Admitting: Gastroenterology

## 2018-01-04 DIAGNOSIS — R1319 Other dysphagia: Secondary | ICD-10-CM

## 2018-01-04 DIAGNOSIS — R1310 Dysphagia, unspecified: Secondary | ICD-10-CM

## 2018-01-10 ENCOUNTER — Other Ambulatory Visit: Payer: Self-pay | Admitting: Gastroenterology

## 2018-01-10 ENCOUNTER — Ambulatory Visit
Admission: RE | Admit: 2018-01-10 | Discharge: 2018-01-10 | Disposition: A | Discharge status: Home / Self Care | Payer: Medicare Other | Source: Ambulatory Visit | Attending: Gastroenterology | Admitting: Gastroenterology

## 2018-01-10 DIAGNOSIS — R1310 Dysphagia, unspecified: Secondary | ICD-10-CM

## 2018-01-10 DIAGNOSIS — R1319 Other dysphagia: Secondary | ICD-10-CM

## 2018-04-22 ENCOUNTER — Ambulatory Visit: Payer: Medicare Other | Admitting: Interventional Cardiology

## 2018-04-25 ENCOUNTER — Encounter: Payer: Self-pay | Admitting: Interventional Cardiology

## 2018-05-24 ENCOUNTER — Other Ambulatory Visit: Payer: Self-pay | Admitting: Interventional Cardiology

## 2018-05-31 ENCOUNTER — Telehealth: Payer: Self-pay | Admitting: Interventional Cardiology

## 2018-05-31 NOTE — Telephone Encounter (Signed)
For about a month now pt has been having issues with intermittent SOB.  States it is when he is barely exerting.  Stops to rest and it resolves quickly.  Denies CP, swelling, dizziness, or any other cardiac issues.  No vitals, including weight.  Hasn't checked his BP in awhile but states it was always good since medication changes last year in September.  Advised I will send to Dr. Tamala Julian for review and call with any recommendations.    Pt did mention that wife recently passed and he has been having a hard time since this occurred.

## 2018-05-31 NOTE — Telephone Encounter (Signed)
New message      Pt c/o Shortness Of Breath: STAT if SOB developed within the last 24 hours or pt is noticeably SOB on the phone  1. Are you currently SOB (can you hear that pt is SOB on the phone)? No   2. How long have you been experiencing SOB?patient states had sob for the last month  3. Are you SOB when sitting or when up moving around? Moving around  4. Are you currently experiencing any other symptoms?no

## 2018-06-01 MED ORDER — ISOSORBIDE MONONITRATE ER 60 MG PO TB24: 60.0000 mg | ORAL_TABLET | Freq: Every day | ORAL | 3 refills | Status: DC

## 2018-06-01 NOTE — Telephone Encounter (Signed)
Left message to call back  

## 2018-06-01 NOTE — Telephone Encounter (Signed)
Increae Imdur to 60 mg daily. Use NTG as needed for pain. Give f/u info.

## 2018-06-01 NOTE — Telephone Encounter (Signed)
Spoke with pt and went over recommendations per Dr. Smith.  Pt verbalized understanding and was in agreement with this plan.   

## 2018-06-23 ENCOUNTER — Telehealth: Payer: Self-pay | Admitting: Cardiology

## 2018-06-23 NOTE — Telephone Encounter (Signed)
Spoke with patient and his daughter, Thayer Headings, who confirmed all demographics. Thayer Headings will be with him with a smart phone for his visit. My Chart is active. Will have vitals ready.

## 2018-06-26 NOTE — Progress Notes (Signed)
Virtual Visit via Video Note   This visit type was conducted due to national recommendations for restrictions regarding the COVID-19 Pandemic (e.g. social distancing) in an effort to limit this patient's exposure and mitigate transmission in our community.  Due to his co-morbid illnesses, this patient is at least at moderate risk for complications without adequate follow up.  This format is felt to be most appropriate for this patient at this time.  All issues noted in this document were discussed and addressed.  A limited physical exam was performed with this format.  Please refer to the patient's chart for his consent to telehealth for [facility].   Date:  06/27/2018   ID:  Brandon Harris, DOB 09/02/1928, MRN [id number]  Patient Location: Home Provider Location: Home  PCP:  Antony Contras, MD  Cardiologist:  Sinclair Grooms, MD   Evaluation Performed:  Follow-Up Visit  Chief Complaint: Follow-up for CAD and exertional SOB, seen for Dr. Tamala Julian  History of Present Illness:    Brandon Harris is a 85 y.o. male with a prior history of CAD s/p CABG, HTN, HLD, CKD stage IV, carotid diseaseand CVA. Last cardiac cath performed 10/27/2017 that demonstrated severe LAD stenosis beyond LIMA insertion which was responding to medical therapy.  He was last seen by Dr. Tamala Julian on 12/22/2017. At that time he was not having chest tightness or pressure however did report more dyspnea on exertion after his cath, thought to be his anginal equivalent. His ARB was discontinued in the post procedure setting secondary to rising creatinine. He was started on hydralazine and isosorbide therapies, as well as Plavix. He was reporting dyspnea on exertion with moderate walking or heavy physical activity. He stated that his current physical abilities were acceptable. He had not used his SL NTG.  Per discussion with Dr. Tamala Julian, he would like to use percutaneous intervention only as a bailout procedure if symptoms become  excessive. Additionally, he would notice an occasional palpitation.   Per chart review, Dr. Tamala Julian advised the patient to use SL NTG if dyspnea occurred at rest or did not resolve with rest.  He had a previous fall prior to last office visit in which he sustained a large laceration on his head. The patient cannot recall if he lost his balance or if he actually fainted.  He reported no prodromal symptoms and no recurrence.  There is a telephone encounter 05/31/2018 in which the patient continued to have shortness of breath for approximately 1 month duration with exertional qualities.  Dr. Tamala Julian advised to increase his Imdur to 60 mg daily and use SL NTG as needed for pain.  Today, patient reports minimal dyspnea.  He states he continues to mow his yard which is approximately 5 acres of land and gardens without much shortness of breath.  At times he will notice an increase in dyspnea and will sit down and rest and his symptoms will go away.  He reports only needing SL NTG for dyspnea approximately 2-3 times in the last 6 months.  When he does, he will only need 1 tab and symptoms go completely away.  At this time, he does not feel that his shortness of breath is impeding on his lifestyle and feels that he is very stable.  He is moderately concerned about his blood pressure today and reports that his SBP is typically in the 150 range.  His amlodipine was decreased to 5 mg at some point for unclear reasons.  I have  instructed him to increase this to 10.  He also has been taking his "as needed" hydralazine 25 mg twice daily on a continuous basis given that his losartan was discontinued secondary to renal dysfunction.  He reports  good tolerance with Imdur 60 mg daily.  He was placed on omeprazole for heartburn symptoms by his PCP.  We discussed the interactions between omeprazole and Plavix and that this would need to be changed to Protonix.  He is agreeable.  I have asked him to keep a better log of his BPs,  taking his pressure approximately 1 hour after medications and about 10 to 15 minutes after sitting quietly for more accurate results.  He is to call the office with results and med titration can be adjusted at that time.  His labs were recently drawn in 02/2018 and he returns to his PCP early July for repeat labs.  I have asked that they send those results to our office.  Otherwise, he is doing very well.  The patient does not have symptoms concerning for COVID-19 infection (fever, chills, cough, or new shortness of breath).   Past Medical History:  Diagnosis Date   Arthritis    Carotid arterial disease ([place])    Chronic kidney disease (CKD), stage IV (severe) (HCC)    Diverticulosis    colonic diverticulosis   Gallstones    GERD (gastroesophageal reflux disease)    Hypertension    Myocardial infarction [facility])    Prostate cancer ([place])    Status post coronary artery bypass grafting     intraoperative cholangiogram   Stroke [facility])    patient reports no deficits    Urinary incontinence    bladder leakage - " patient wears a pad"   Past Surgical History:  Procedure Laterality Date   BACK SURGERY     x 4, has titanium in back per patient    CHOLECYSTECTOMY, LAPAROSCOPIC     CORONARY ARTERY BYPASS GRAFT  2001   CRYOTHERAPY     Prostate Cancer   LEFT HEART CATH AND CORS/GRAFTS ANGIOGRAPHY N/A 10/27/2017   Procedure: LEFT HEART CATH AND CORS/GRAFTS ANGIOGRAPHY;  Surgeon: Belva Crome, MD;  Location: [place] CV [facility];  Service: Cardiovascular;  Laterality: N/A;   prostate cancer     ROTATOR CUFF REPAIR     LEFT   TOTAL KNEE ARTHROPLASTY Right 12/18/2014   Procedure: TOTAL RIGHT KNEE ARTHROPLASTY;  Surgeon: Paralee Cancel, MD;  Location: [facility];  Service: Orthopedics;  Laterality: Right;     Current Meds  Medication Sig   ALPRAZolam (XANAX) 0.25 MG tablet Take 0.25 mg by mouth at bedtime as needed for anxiety or sleep. TAKE ONE TABLET AT NIGHT   amitriptyline  (ELAVIL) 10 MG tablet Take 20 mg by mouth at bedtime. Take two (2) tablets (20 mg) by mouth daily.   amLODipine (NORVASC) 10 MG tablet Take 10 mg by mouth daily.   aspirin EC 81 MG tablet Take 1 tablet (81 mg total) by mouth daily.   atorvastatin (LIPITOR) 80 MG tablet Take 80 mg by mouth every evening.    Cholecalciferol (VITAMIN D3) 2000 UNITS TABS Take 2,000 Units by mouth daily.   clopidogrel (PLAVIX) 75 MG tablet Take 1 tablet (75 mg total) by mouth daily.   hydrALAZINE (APRESOLINE) 25 MG tablet Take 1 tablet (25 mg total) by mouth 2 (two) times a day.   isosorbide mononitrate (IMDUR) 60 MG 24 hr tablet Take 1 tablet (60 mg total) by mouth daily.  Melatonin 10 MG TABS Take 10 mg by mouth at bedtime.   mirtazapine (REMERON) 7.5 MG tablet Take 7.5 mg by mouth at bedtime.    nitroGLYCERIN (NITROSTAT) 0.4 MG SL tablet Place 1 tablet (0.4 mg total) under the tongue every 5 (five) minutes as needed for chest pain.   tamsulosin (FLOMAX) 0.4 MG CAPS capsule Take 0.8 mg by mouth daily. Take two (2) capsules (0.8 mg) by mouth daily.    zolpidem (AMBIEN) 10 MG tablet Take 10 mg by mouth at bedtime.    [DISCONTINUED] famotidine (PEPCID) 20 MG tablet Take 20 mg by mouth at bedtime.   [DISCONTINUED] hydrALAZINE (APRESOLINE) 25 MG tablet TAKE 1 TABLET BY MOUTH TWICE DAILY AS NEEDED FOR BLOOD PRESSURE 150/90 OR HIGHER. DISCONTINUE LOSARTAN   [DISCONTINUED] omeprazole (PRILOSEC) 20 MG capsule Take 20 mg by mouth daily.      Allergies:   Patient has no known allergies.   Social History   Tobacco Use   Smoking status: Never Smoker   Smokeless tobacco: Never Used  Substance Use Topics   Alcohol use: No   Drug use: No     Family Hx: The patient's family history includes Heart failure in his mother.  ROS:   Please see the history of present illness.     All other systems reviewed and are negative.  Prior CV studies:   The following studies were reviewed today:  Cardiac  catheterization 10/27/2017:  Widely patent bypass grafts including sequential saphenous vein graft to PDA and PL, saphenous vein graft to OM, saphenous vein graft to LAD, and LIMA to OM1.  Severe native vessel disease including heavy diffuse calcification in all territories.  Segmental 50% left main.  Ostial occlusion of the right coronary.  Total occlusion of the mid LAD after the first diagonal.  The LAD beyond the saphenous vein graft insertion into the mid to distal LAD contains calcified relatively focal 95% stenosis.  Total occlusion of the proximal circumflex.  Normal LVEDP  RECOMMENDATIONS:   Continue medical therapy  Four additional hours of IV hydration prior to discharge.  Kidney function on Friday.  Plavix 75 mg/day.  Consider PTCA of LAD via the saphenous vein graft once kidneys recover and if symptoms warrant.  Vascular carotid ultrasound 09/10/2017: Final Interpretation: Right Carotid: Velocities in the right ICA are consistent with a 40-59%                stenosis.                Non-hemodynamically significant plaque <50% noted in the CCA.  Left Carotid: Velocities in the left ICA are consistent with a 1-39% stenosis.               Non-hemodynamically significant plaque noted in the CCA.                 The ECA appears >50% stenosed.                 Duplex imaging of the brachial arteries shows triphasic waveforms,               bilaterally.  Vertebrals:  Left vertebral artery demonstrates an occlusion. Atypical antegrade              right vertebral. Subclavians: Bilateral subclavian artery flow was disturbed.  Labs/Other Tests and Data Reviewed:    EKG:  An ECG dated 10/26/2017 was personally reviewed today and demonstrated:  NSR with nonspecific T wave abnormality and  evidence of LVH with no acute ischemic changes  Recent Labs: 10/26/2017: Hemoglobin 12.6; NT-Pro BNP 366; Platelets 267 11/15/2017: BUN 23; Creatinine, Ser 2.03; Potassium 4.6; Sodium  141   Recent Lipid Panel No results found for: CHOL, TRIG, HDL, CHOLHDL, LDLCALC, LDLDIRECT  Wt Readings from Last 3 Encounters:  06/27/18 190 lb (86.2 kg)  12/22/17 197 lb 12.8 oz (89.7 kg)  12/06/17 202 lb (91.6 kg)     Objective:    Vital Signs:  BP (!) 145/52    Pulse 87    Ht 5\' 10"  (1.778 m)    Wt 190 lb (86.2 kg)    BMI 27.26 kg/m    VITAL SIGNS:  reviewed GEN:  no acute distress EYES:  sclerae anicteric, EOMI - Extraocular Movements Intact RESPIRATORY:  normal respiratory effort, symmetric expansion SKIN:  no rash, lesions or ulcers. MUSCULOSKELETAL:  no obvious deformities. NEURO:  alert and oriented x 3, no obvious focal deficit PSYCH:  normal affect  ASSESSMENT & PLAN:    1.  History of CAD s/p CABG with angina: -DOE more controlled with minimal need for SL NTG, approximately 2-3 times in the last 6 months -Continues to be able to do extensive yard work and gardening without much complication>> he feels very stable -Patient continues to feel his quality of life is adequate given current restrictions and would prefer to avoid another cath unless absolutely necessary -Continue ASA 81, Plavix 75, hydralazine 25, Imdur 60 -Discussed interaction with omeprazole and Plavix and have now changed this to Protonix 40 mg  2.  Bilateral carotid artery stenosis: -Asymptomatic, no c/o of dizziness, syncope  -See report above   3.  CKD stage IV: -Baseline creatinine, 2.00 on 02/24/2018 -Patient had a significant elevation in the post cath setting after low-volume contrast exposure 10/2017 -Follows with nephrology>> appointment coming soon -No ACE or ARB in the setting of renal dysfunction  4.  HLD: -Last LDL, 88 on 02/24/2018, not currently at goal of 70mg /dl -Continue atorvastatin 80 mg daily -Lipid panel to be obtained by PCP early July.  I have asked that these results be forwarded to our office.  If continues to be elevated, would consider the addition of Zetia to  regimen to help LDL to goal  -If no improvement with the addition of Zetia, along with diet and exercise interventions although this is limited given DOE, may need referral to lipid clinic for possible PCSK9 inhibitor  5.  HTN: -Mildly elevated, 145/52 -Changed hydralazine to 25 mg twice daily as he was already taking this -Increased amlodipine to 10 and continued Imdur at 60 -Patient to keep better BP log including taking BP 1 hour after medications and after sitting for 10 to 15 minutes quietly for more accurate results -If continues to be elevated, patient to contact office for further med titration otherwise will address at close follow-up appointment in 3 months   COVID-19 Education: The signs and symptoms of COVID-19 were discussed with the patient and how to seek care for testing (follow up with PCP or arrange E-visit). The importance of social distancing was discussed today.  Time:   Today, I have spent 25 minutes with the patient with telehealth technology discussing the above problems.     Medication Adjustments/Labs and Tests Ordered: Current medicines are reviewed at length with the patient today.  Concerns regarding medicines are outlined above.   Tests Ordered: No orders of the defined types were placed in this encounter.   Medication Changes: Meds ordered  this encounter  Medications   pantoprazole (PROTONIX) 40 MG tablet    Sig: Take 1 tablet (40 mg total) by mouth daily.    Dispense:  90 tablet    Refill:  3   hydrALAZINE (APRESOLINE) 25 MG tablet    Sig: Take 1 tablet (25 mg total) by mouth 2 (two) times a day.    Dispense:  180 tablet    Refill:  2    Disposition:  Follow up Dr. Tamala Julian in 3 months  Signed, Kathyrn Drown, NP  06/27/2018 2:17 PM    [place]

## 2018-06-27 ENCOUNTER — Other Ambulatory Visit: Payer: Self-pay

## 2018-06-27 ENCOUNTER — Encounter: Payer: Self-pay | Admitting: Cardiology

## 2018-06-27 ENCOUNTER — Telehealth (INDEPENDENT_AMBULATORY_CARE_PROVIDER_SITE_OTHER): Payer: Medicare Other | Admitting: Cardiology

## 2018-06-27 VITALS — BP 145/52 | HR 87 | Ht 70.0 in | Wt 190.0 lb

## 2018-06-27 DIAGNOSIS — I6523 Occlusion and stenosis of bilateral carotid arteries: Secondary | ICD-10-CM | POA: Diagnosis not present

## 2018-06-27 DIAGNOSIS — E785 Hyperlipidemia, unspecified: Secondary | ICD-10-CM

## 2018-06-27 DIAGNOSIS — I10 Essential (primary) hypertension: Secondary | ICD-10-CM

## 2018-06-27 DIAGNOSIS — I25708 Atherosclerosis of coronary artery bypass graft(s), unspecified, with other forms of angina pectoris: Secondary | ICD-10-CM

## 2018-06-27 DIAGNOSIS — N184 Chronic kidney disease, stage 4 (severe): Secondary | ICD-10-CM

## 2018-06-27 MED ORDER — HYDRALAZINE HCL 25 MG PO TABS: 25.0000 mg | ORAL_TABLET | Freq: Two times a day (BID) | ORAL | 2 refills | Status: DC

## 2018-06-27 MED ORDER — PANTOPRAZOLE SODIUM 40 MG PO TBEC: 40.0000 mg | DELAYED_RELEASE_TABLET | Freq: Every day | ORAL | 3 refills | Status: DC

## 2018-06-27 NOTE — Patient Instructions (Signed)
Medication Instructions:  1. Discontinue Omeprazole 2. Start Protonix one tablet (40 mg ) daily 3. Increase Amlodipine one tablet (10 mg ) daily  If you need a refill on your cardiac medications before your next appointment, please call your pharmacy.     Your physician recommends that you keep your scheduled  follow-up appointment with Dr. Tamala Julian September 3.    Any Other Special Instructions Will Be Listed Below (If Applicable).

## 2018-07-23 ENCOUNTER — Other Ambulatory Visit: Payer: Self-pay | Admitting: Interventional Cardiology

## 2018-07-27 ENCOUNTER — Other Ambulatory Visit: Payer: Self-pay | Admitting: Physician Assistant

## 2018-08-04 ENCOUNTER — Other Ambulatory Visit: Payer: Self-pay | Admitting: Interventional Cardiology

## 2018-08-04 MED ORDER — ISOSORBIDE MONONITRATE ER 60 MG PO TB24: 60.0000 mg | ORAL_TABLET | Freq: Every day | ORAL | 3 refills | Status: DC

## 2018-09-14 ENCOUNTER — Ambulatory Visit (HOSPITAL_COMMUNITY)
Admission: RE | Admit: 2018-09-14 | Discharge: 2018-09-14 | Disposition: A | Discharge status: Home / Self Care | Payer: Medicare Other | Source: Ambulatory Visit | Attending: Cardiology | Admitting: Cardiology

## 2018-09-14 ENCOUNTER — Other Ambulatory Visit: Payer: Self-pay | Admitting: Cardiology

## 2018-09-14 ENCOUNTER — Other Ambulatory Visit: Payer: Self-pay

## 2018-09-14 DIAGNOSIS — I25708 Atherosclerosis of coronary artery bypass graft(s), unspecified, with other forms of angina pectoris: Secondary | ICD-10-CM

## 2018-09-14 DIAGNOSIS — I6523 Occlusion and stenosis of bilateral carotid arteries: Secondary | ICD-10-CM

## 2018-09-14 DIAGNOSIS — I739 Peripheral vascular disease, unspecified: Secondary | ICD-10-CM | POA: Diagnosis present

## 2018-09-14 DIAGNOSIS — I779 Disorder of arteries and arterioles, unspecified: Secondary | ICD-10-CM

## 2018-09-21 ENCOUNTER — Other Ambulatory Visit: Payer: Self-pay | Admitting: Interventional Cardiology

## 2018-09-21 MED ORDER — HYDRALAZINE HCL 25 MG PO TABS: 25.0000 mg | ORAL_TABLET | Freq: Two times a day (BID) | ORAL | 2 refills | Status: DC

## 2018-09-21 MED ORDER — PANTOPRAZOLE SODIUM 40 MG PO TBEC: 40.0000 mg | DELAYED_RELEASE_TABLET | Freq: Every day | ORAL | 2 refills | Status: DC

## 2018-09-21 MED ORDER — NITROGLYCERIN 0.4 MG SL SUBL: 0.4000 mg | SUBLINGUAL_TABLET | SUBLINGUAL | 6 refills | Status: DC | PRN

## 2018-09-21 NOTE — Telephone Encounter (Signed)
Pt's medications were sent to pt's pharmacy as requested. Confirmation received.  

## 2018-09-21 NOTE — Telephone Encounter (Signed)
New Message    *STAT* If patient is at the pharmacy, call can be transferred to refill team.   1. Which medications need to be refilled? (please list name of each medication and dose if known)   pantoprazole (PROTONIX) 40 MG tablet  nitroGLYCERIN (NITROSTAT) 0.4 MG SL tablet hydrALAZINE (APRESOLINE) 25 MG tablet  2. Which pharmacy/location (including street and city if local pharmacy) is medication to be sent to? [facility] [place]  [id number]  [phone]  3. Do they need a 30 day or 90 day supply? 90 day supply

## 2018-10-12 NOTE — Progress Notes (Signed)
Not able to connect

## 2018-10-13 ENCOUNTER — Other Ambulatory Visit: Payer: Self-pay

## 2018-10-13 ENCOUNTER — Telehealth: Payer: Medicare Other | Admitting: Interventional Cardiology

## 2018-10-13 ENCOUNTER — Encounter: Payer: Self-pay | Admitting: Interventional Cardiology

## 2018-10-13 VITALS — BP 147/63 | HR 76 | Ht 69.0 in | Wt 185.0 lb

## 2018-10-13 DIAGNOSIS — I779 Disorder of arteries and arterioles, unspecified: Secondary | ICD-10-CM

## 2018-10-13 DIAGNOSIS — E785 Hyperlipidemia, unspecified: Secondary | ICD-10-CM

## 2018-10-13 DIAGNOSIS — N184 Chronic kidney disease, stage 4 (severe): Secondary | ICD-10-CM

## 2018-10-13 DIAGNOSIS — I25708 Atherosclerosis of coronary artery bypass graft(s), unspecified, with other forms of angina pectoris: Secondary | ICD-10-CM

## 2018-10-13 DIAGNOSIS — Z7189 Other specified counseling: Secondary | ICD-10-CM

## 2018-10-13 DIAGNOSIS — I10 Essential (primary) hypertension: Secondary | ICD-10-CM

## 2019-04-20 ENCOUNTER — Other Ambulatory Visit: Payer: Self-pay

## 2019-04-20 ENCOUNTER — Encounter (HOSPITAL_COMMUNITY): Payer: Self-pay

## 2019-04-20 ENCOUNTER — Emergency Department (HOSPITAL_COMMUNITY)
Admission: EM | Admit: 2019-04-20 | Discharge: 2019-04-20 | Disposition: A | Discharge status: Home / Self Care | Payer: Medicare Other | Attending: Emergency Medicine | Admitting: Emergency Medicine

## 2019-04-20 DIAGNOSIS — S51812A Laceration without foreign body of left forearm, initial encounter: Secondary | ICD-10-CM | POA: Diagnosis not present

## 2019-04-20 DIAGNOSIS — Z951 Presence of aortocoronary bypass graft: Secondary | ICD-10-CM | POA: Insufficient documentation

## 2019-04-20 DIAGNOSIS — Z8673 Personal history of transient ischemic attack (TIA), and cerebral infarction without residual deficits: Secondary | ICD-10-CM | POA: Insufficient documentation

## 2019-04-20 DIAGNOSIS — I2510 Atherosclerotic heart disease of native coronary artery without angina pectoris: Secondary | ICD-10-CM | POA: Diagnosis not present

## 2019-04-20 DIAGNOSIS — S59912A Unspecified injury of left forearm, initial encounter: Secondary | ICD-10-CM | POA: Diagnosis present

## 2019-04-20 DIAGNOSIS — Y92195 Garage of other specified residential institution as the place of occurrence of the external cause: Secondary | ICD-10-CM | POA: Insufficient documentation

## 2019-04-20 DIAGNOSIS — Y9301 Activity, walking, marching and hiking: Secondary | ICD-10-CM | POA: Insufficient documentation

## 2019-04-20 DIAGNOSIS — N184 Chronic kidney disease, stage 4 (severe): Secondary | ICD-10-CM | POA: Insufficient documentation

## 2019-04-20 DIAGNOSIS — I252 Old myocardial infarction: Secondary | ICD-10-CM | POA: Insufficient documentation

## 2019-04-20 DIAGNOSIS — W228XXA Striking against or struck by other objects, initial encounter: Secondary | ICD-10-CM | POA: Diagnosis not present

## 2019-04-20 DIAGNOSIS — Z7982 Long term (current) use of aspirin: Secondary | ICD-10-CM | POA: Diagnosis not present

## 2019-04-20 DIAGNOSIS — Z96651 Presence of right artificial knee joint: Secondary | ICD-10-CM | POA: Insufficient documentation

## 2019-04-20 DIAGNOSIS — Z23 Encounter for immunization: Secondary | ICD-10-CM | POA: Insufficient documentation

## 2019-04-20 DIAGNOSIS — Z79899 Other long term (current) drug therapy: Secondary | ICD-10-CM | POA: Diagnosis not present

## 2019-04-20 DIAGNOSIS — Z8546 Personal history of malignant neoplasm of prostate: Secondary | ICD-10-CM | POA: Diagnosis not present

## 2019-04-20 DIAGNOSIS — W19XXXA Unspecified fall, initial encounter: Secondary | ICD-10-CM

## 2019-04-20 DIAGNOSIS — Y998 Other external cause status: Secondary | ICD-10-CM | POA: Insufficient documentation

## 2019-04-20 DIAGNOSIS — I129 Hypertensive chronic kidney disease with stage 1 through stage 4 chronic kidney disease, or unspecified chronic kidney disease: Secondary | ICD-10-CM | POA: Diagnosis not present

## 2019-04-20 MED ORDER — TETANUS-DIPHTH-ACELL PERTUSSIS 5-2.5-18.5 LF-MCG/0.5 IM SUSP
0.5000 mL | Freq: Once | INTRAMUSCULAR | Status: AC
  Administered 2019-04-20: 0.5 mL via INTRAMUSCULAR
  Filled 2019-04-20: qty 0.5

## 2019-04-20 NOTE — ED Notes (Signed)
An After Visit Summary was printed and given to the patient. Discharge instructions given and no further questions at this time.  

## 2019-04-20 NOTE — Discharge Instructions (Addendum)
Leave tegaderm dressings in place. Recheck with your doctor by Monday. Return to ER for worsening or concerning symptoms.
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
## 2019-04-22 ENCOUNTER — Emergency Department (HOSPITAL_COMMUNITY)
Admission: EM | Admit: 2019-04-22 | Discharge: 2019-04-22 | Disposition: A | Discharge status: Home / Self Care | Payer: Medicare Other | Attending: Emergency Medicine | Admitting: Emergency Medicine

## 2019-04-22 ENCOUNTER — Other Ambulatory Visit: Payer: Self-pay

## 2019-04-22 DIAGNOSIS — Z7982 Long term (current) use of aspirin: Secondary | ICD-10-CM | POA: Diagnosis not present

## 2019-04-22 DIAGNOSIS — Z951 Presence of aortocoronary bypass graft: Secondary | ICD-10-CM | POA: Insufficient documentation

## 2019-04-22 DIAGNOSIS — X58XXXD Exposure to other specified factors, subsequent encounter: Secondary | ICD-10-CM | POA: Diagnosis not present

## 2019-04-22 DIAGNOSIS — I1310 Hypertensive heart and chronic kidney disease without heart failure, with stage 1 through stage 4 chronic kidney disease, or unspecified chronic kidney disease: Secondary | ICD-10-CM | POA: Diagnosis not present

## 2019-04-22 DIAGNOSIS — I2510 Atherosclerotic heart disease of native coronary artery without angina pectoris: Secondary | ICD-10-CM | POA: Insufficient documentation

## 2019-04-22 DIAGNOSIS — N184 Chronic kidney disease, stage 4 (severe): Secondary | ICD-10-CM | POA: Diagnosis not present

## 2019-04-22 DIAGNOSIS — Z79899 Other long term (current) drug therapy: Secondary | ICD-10-CM | POA: Diagnosis not present

## 2019-04-22 DIAGNOSIS — S51812D Laceration without foreign body of left forearm, subsequent encounter: Secondary | ICD-10-CM | POA: Insufficient documentation

## 2019-04-22 NOTE — ED Triage Notes (Signed)
Patient reports he was evaluated for skin tear on Thursday, area will not stop bleeding. Patient presents with tegaderm on wound. Patient denies pain. Wound covered from wrist to middle of upper arm.

## 2019-04-22 NOTE — ED Notes (Addendum)
Tegaderm removed and left arm wound irrigated with 1087ml NS.

## 2019-04-22 NOTE — ED Notes (Signed)
Pt arm has been dressed. Pt is getting dressed and ready to discharge.

## 2019-04-22 NOTE — Discharge Instructions (Addendum)
Keep area clean and dry. Follow up with your doctor. Return here  as needed. Allow the wound to get some air to it as well.

## 2019-04-22 NOTE — ED Provider Notes (Signed)
[facility] DEPT Provider Note   CSN: [id number] Arrival date & time: 04/22/19  4098     History Chief Complaint  Patient presents with  . Arm Injury    left    Brandon Harris is a 86 y.o. male.  HPI Patient presents to the emergency department for a recheck of his left arm where he had skin tear from an injury 2 days ago.  The patient has a Tegaderm in place he states that he has not removed it since he was here in the emergency department.  The patient was concerned because it still seems to be leaking blood.  Patient has no other injuries and no other concerns at this time.  Patient denies any fevers, nausea, vomiting, weakness, dizziness or syncope.    Past Medical History:  Diagnosis Date  . Arthritis   . Carotid arterial disease ([place])   . Chronic kidney disease (CKD), stage IV (severe) ([place])   . Diverticulosis    colonic diverticulosis  . Gallstones   . GERD (gastroesophageal reflux disease)   . Hypertension   . Myocardial infarction ([place])   . Prostate cancer ([place])   . Status post coronary artery bypass grafting     intraoperative cholangiogram  . Stroke [facility])    patient reports no deficits   . Urinary incontinence    bladder leakage - " patient wears a pad"    Patient Active Problem List   Diagnosis Date Noted  . CKD (chronic kidney disease) stage 4, GFR 15-29 ml/min (HCC) 10/27/2017  . Dyspnea on exertion 05/03/2015  . Overweight (BMI 25.0-29.9) 12/19/2014  . S/P right TKA 12/18/2014  . Carotid disease, bilateral ([place]) 02/28/2013  . Coronary artery disease involving coronary bypass graft of native heart with angina pectoris ([place]) 02/28/2013  . Essential hypertension 02/28/2013  . Hyperlipidemia 02/28/2013    Past Surgical History:  Procedure Laterality Date  . BACK SURGERY     x 4, has titanium in back per patient   . CHOLECYSTECTOMY, LAPAROSCOPIC    . CORONARY ARTERY BYPASS GRAFT  2001  . CRYOTHERAPY     Prostate Cancer   . LEFT HEART CATH AND CORS/GRAFTS ANGIOGRAPHY N/A 10/27/2017   Procedure: LEFT HEART CATH AND CORS/GRAFTS ANGIOGRAPHY;  Surgeon: Belva Crome, MD;  Location: [facility] CV LAB;  Service: Cardiovascular;  Laterality: N/A;  . prostate cancer    . ROTATOR CUFF REPAIR     LEFT  . TOTAL KNEE ARTHROPLASTY Right 12/18/2014   Procedure: TOTAL RIGHT KNEE ARTHROPLASTY;  Surgeon: Paralee Cancel, MD;  Location: [facility] ORS;  Service: Orthopedics;  Laterality: Right;       Family History  Problem Relation Age of Onset  . Heart failure Mother     Social History   Tobacco Use  . Smoking status: Never Smoker  . Smokeless tobacco: Never Used  Substance Use Topics  . Alcohol use: No  . Drug use: No    Home Medications Prior to Admission medications   Medication Sig Start Date End Date Taking? Authorizing Provider  ALPRAZolam Duanne Moron) 0.25 MG tablet Take 0.25 mg by mouth at bedtime as needed for anxiety or sleep. TAKE ONE TABLET AT NIGHT 02/13/13   [provider]  amitriptyline (ELAVIL) 10 MG tablet Take 20 mg by mouth at bedtime. Take two (2) tablets (20 mg) by mouth daily. 08/08/17   [provider]  amLODipine (NORVASC) 10 MG tablet Take 10 mg by mouth daily.    [provider]  aspirin EC 81 MG tablet Take 1 tablet (81 mg total) by mouth daily. 06/03/16   Belva Crome, MD  atorvastatin (LIPITOR) 80 MG tablet Take 80 mg by mouth every evening.     [provider]  Cholecalciferol (VITAMIN D3) 2000 UNITS TABS Take 2,000 Units by mouth daily.    [provider]  clopidogrel (PLAVIX) 75 MG tablet TAKE 1 TABLET BY MOUTH DAILY 07/25/18   Belva Crome, MD  hydrALAZINE (APRESOLINE) 25 MG tablet Take 1 tablet (25 mg total) by mouth 2 (two) times daily. 09/21/18   Belva Crome, MD  isosorbide mononitrate (IMDUR) 60 MG 24 hr tablet Take 1 tablet (60 mg total) by mouth daily. 08/04/18   Belva Crome, MD  Melatonin 10 MG TABS Take 10 mg by mouth at bedtime.     [provider]  mirtazapine (REMERON) 7.5 MG tablet Take 7.5 mg by mouth at bedtime.  06/10/18   [provider]  nitroGLYCERIN (NITROSTAT) 0.4 MG SL tablet Place 1 tablet (0.4 mg total) under the tongue every 5 (five) minutes as needed for chest pain. 09/21/18   Belva Crome, MD  pantoprazole (PROTONIX) 40 MG tablet Take 1 tablet (40 mg total) by mouth daily. 09/21/18   Belva Crome, MD  tamsulosin (FLOMAX) 0.4 MG CAPS capsule Take 0.8 mg by mouth daily. Take two (2) capsules (0.8 mg) by mouth daily.  08/08/17   [provider]  zolpidem (AMBIEN) 10 MG tablet Take 10 mg by mouth at bedtime.     [provider]    Allergies    Patient has no known allergies.  Review of Systems   Review of Systems All other systems negative except as documented in the HPI. All pertinent positives and negatives as reviewed in the HPI. Physical Exam Updated Vital Signs BP (!) 145/105 (BP Location: Right Arm)   Pulse 92   Temp 97.6 F (36.4 C) (Oral)   Resp 20   Ht 5\' 9"  (1.753 m)   Wt 90.7 kg   SpO2 99%   BMI 29.53 kg/m   Physical Exam Vitals and nursing note reviewed.  Constitutional:      General: He is not in acute distress.    Appearance: He is well-developed.  HENT:     Head: Normocephalic and atraumatic.  Eyes:     Pupils: Pupils are equal, round, and reactive to light.  Pulmonary:     Effort: Pulmonary effort is normal.  Skin:    General: Skin is warm and dry.     Comments: Patient has several skin tears about the left arm extending from above the elbow down to the forearm.  The area was covered with a Tegaderm and this was removed and the area was cleansed.  I feel that the area needs to get some air to it rather than being closed up at Tegaderm.  Neurological:     Mental Status: He is alert and oriented to person, place, and time.     ED Results / Procedures / Treatments   Labs (all labs ordered are listed, but only abnormal results are  displayed) Labs Reviewed - No data to display  EKG None  Radiology No results found.  Procedures Procedures (including critical care time)  Medications Ordered in ED Medications - No data to display  ED Course  I have reviewed the triage vital signs and the nursing notes.  Pertinent labs & imaging results that were  available during my care of the patient were reviewed by me and considered in my medical decision making (see chart for details).    MDM Rules/Calculators/A&P                      There are no signs of infection about these skin tears but I did advise the patient that he will need allow some air to get to this wound because of the fact that the increasing moisture could cause a problem.  The patient agrees with plan and all questions were answered.  I did advise him to follow-up with his primary care doctor as well. Final Clinical Impression(s) / ED Diagnoses Final diagnoses:  None    Rx / DC Orders ED Discharge Orders    None       Dalia Heading, PA-C 04/26/19 2356    Malvin Johns, MD 05/03/19 [phone]

## 2019-06-01 ENCOUNTER — Other Ambulatory Visit: Payer: Self-pay | Admitting: Interventional Cardiology

## 2019-06-21 ENCOUNTER — Ambulatory Visit (INDEPENDENT_AMBULATORY_CARE_PROVIDER_SITE_OTHER): Payer: Medicare Other | Admitting: Physician Assistant

## 2019-06-21 ENCOUNTER — Other Ambulatory Visit: Payer: Self-pay

## 2019-06-21 ENCOUNTER — Encounter: Payer: Self-pay | Admitting: Physician Assistant

## 2019-06-21 VITALS — BP 160/76 | HR 77 | Ht 69.0 in | Wt 205.6 lb

## 2019-06-21 DIAGNOSIS — E785 Hyperlipidemia, unspecified: Secondary | ICD-10-CM | POA: Diagnosis not present

## 2019-06-21 DIAGNOSIS — N184 Chronic kidney disease, stage 4 (severe): Secondary | ICD-10-CM | POA: Diagnosis not present

## 2019-06-21 DIAGNOSIS — I25708 Atherosclerosis of coronary artery bypass graft(s), unspecified, with other forms of angina pectoris: Secondary | ICD-10-CM | POA: Diagnosis not present

## 2019-06-21 DIAGNOSIS — I6523 Occlusion and stenosis of bilateral carotid arteries: Secondary | ICD-10-CM

## 2019-06-21 DIAGNOSIS — I10 Essential (primary) hypertension: Secondary | ICD-10-CM | POA: Diagnosis not present

## 2019-06-21 MED ORDER — CARVEDILOL 6.25 MG PO TABS: 6.2500 mg | ORAL_TABLET | Freq: Two times a day (BID) | ORAL | 3 refills | Status: DC

## 2019-06-21 NOTE — Patient Instructions (Signed)
Medication Instructions:  Your physician has recommended you make the following change in your medication:  1.  START Coreg 6.25 taking 1 tablet twice a day   *If you need a refill on your cardiac medications before your next appointment, please call your pharmacy*   Lab Work: None ordered  If you have labs (blood work) drawn today and your tests are completely normal, you will receive your results only by: Marland Kitchen MyChart Message (if you have MyChart) OR . A paper copy in the mail If you have any lab test that is abnormal or we need to change your treatment, we will call you to review the results.   Testing/Procedures: None ordered   Follow-Up: At [facility], you and your health needs are our priority.  As part of our continuing mission to provide you with exceptional heart care, we have created designated Provider Care Teams.  These Care Teams include your primary Cardiologist (physician) and Advanced Practice Providers (APPs -  Physician Assistants and Nurse Practitioners) who all work together to provide you with the care you need, when you need it.  We recommend signing up for the patient portal called "MyChart".  Sign up information is provided on this After Visit Summary.  MyChart is used to connect with patients for Virtual Visits (Telemedicine).  Patients are able to view lab/test results, encounter notes, upcoming appointments, etc.  Non-urgent messages can be sent to your provider as well.   To learn more about what you can do with MyChart, go to [email or web address].    Your next appointment:   6 weeks  The format for your next appointment:   In Person  Provider:   You may see Sinclair Grooms, MD or one of the following Advanced Practice Providers on your designated Care Team:    Truitt Merle, NP  Cecilie Kicks, NP  Kathyrn Drown, NP    Other Instructions

## 2019-06-21 NOTE — Progress Notes (Signed)
Cardiology Office Note    Date:  06/21/2019   ID:  Brandon Harris, DOB Jan 25, 1929, MRN [id number]  PCP:  Antony Contras, MD  Cardiologist:  Dr. Tamala Julian   Chief Complaint: High blood pressure  History of Present Illness:   Brandon Harris is a 86 y.o. male with history of CAD s/p CABG, chronic kidney disease stage IV, carotid artery disease, CVA, hypertension and hyperlipidemia presented for blood pressure and yearly follow-up.  Last cardiac cath September 2019 showed severe LAD  stenosis beyond LIMA insertion which was responding to medical therapy but continued to have anginal equivalent dyspnea >> increased Imdur. His symptoms were stable when seen by APP virtually 06/2018.  Here today for follow up.  Patient is here for progressive worsening shortness of breath with activity.  No symptoms at rest.  No associated chest pressure or tightness.  His systolic blood pressure running in 160s.  Denies dizziness, palpitation, orthopnea, PND, syncope, lower extremity edema or melena.  He is trying to be active and do yard work.  Past Medical History:  Diagnosis Date  . Arthritis   . Carotid arterial disease ([place])   . Chronic kidney disease (CKD), stage IV (severe) ([place])   . Diverticulosis    colonic diverticulosis  . Gallstones   . GERD (gastroesophageal reflux disease)   . Hypertension   . Myocardial infarction ([place])   . Prostate cancer ([place])   . Status post coronary artery bypass grafting     intraoperative cholangiogram  . Stroke [facility])    patient reports no deficits   . Urinary incontinence    bladder leakage - " patient wears a pad"    Past Surgical History:  Procedure Laterality Date  . BACK SURGERY     x 4, has titanium in back per patient   . CHOLECYSTECTOMY, LAPAROSCOPIC    . CORONARY ARTERY BYPASS GRAFT  2001  . CRYOTHERAPY     Prostate Cancer  . LEFT HEART CATH AND CORS/GRAFTS ANGIOGRAPHY N/A 10/27/2017   Procedure: LEFT HEART CATH AND CORS/GRAFTS ANGIOGRAPHY;  Surgeon:  Belva Crome, MD;  Location: [facility] CV LAB;  Service: Cardiovascular;  Laterality: N/A;  . prostate cancer    . ROTATOR CUFF REPAIR     LEFT  . TOTAL KNEE ARTHROPLASTY Right 12/18/2014   Procedure: TOTAL RIGHT KNEE ARTHROPLASTY;  Surgeon: Paralee Cancel, MD;  Location: [facility];  Service: Orthopedics;  Laterality: Right;    Current Medications: Prior to Admission medications   Medication Sig Start Date End Date Taking? Authorizing Provider  ALPRAZolam Duanne Moron) 0.25 MG tablet Take 0.25 mg by mouth at bedtime as needed for anxiety or sleep. TAKE ONE TABLET AT NIGHT 02/13/13   [provider]  amitriptyline (ELAVIL) 10 MG tablet Take 20 mg by mouth at bedtime. Take two (2) tablets (20 mg) by mouth daily. 08/08/17   [provider]  amLODipine (NORVASC) 10 MG tablet Take 10 mg by mouth daily.    [provider]  aspirin EC 81 MG tablet Take 1 tablet (81 mg total) by mouth daily. 06/03/16   Belva Crome, MD  atorvastatin (LIPITOR) 80 MG tablet Take 80 mg by mouth every evening.     [provider]  Cholecalciferol (VITAMIN D3) 2000 UNITS TABS Take 2,000 Units by mouth daily.    [provider]  clopidogrel (PLAVIX) 75 MG tablet TAKE 1 TABLET BY MOUTH DAILY 07/25/18   Belva Crome, MD  hydrALAZINE (APRESOLINE) 25 MG tablet  TAKE ONE TABLET BY MOUTH AT [facility] AND AT BEDTIME 06/01/19   Belva Crome, MD  isosorbide mononitrate (IMDUR) 60 MG 24 hr tablet Take 1 tablet (60 mg total) by mouth daily. 08/04/18   Belva Crome, MD  Melatonin 10 MG TABS Take 10 mg by mouth at bedtime.    [provider]  mirtazapine (REMERON) 7.5 MG tablet Take 7.5 mg by mouth at bedtime.  06/10/18   [provider]  nitroGLYCERIN (NITROSTAT) 0.4 MG SL tablet Place 1 tablet (0.4 mg total) under the tongue every 5 (five) minutes as needed for chest pain. 09/21/18   Belva Crome, MD  pantoprazole (PROTONIX) 40 MG tablet TAKE ONE TABLET BY MOUTH EVERY MORNING  06/01/19   Belva Crome, MD  tamsulosin (FLOMAX) 0.4 MG CAPS capsule Take 0.8 mg by mouth daily. Take two (2) capsules (0.8 mg) by mouth daily.  08/08/17   [provider]  zolpidem (AMBIEN) 10 MG tablet Take 10 mg by mouth at bedtime.     [provider]    Allergies:   Patient has no known allergies.   Social History   Socioeconomic History  . Marital status: Married    Spouse name: Not on file  . Number of children: Not on file  . Years of education: Not on file  . Highest education level: Not on file  Occupational History  . Not on file  Tobacco Use  . Smoking status: Never Smoker  . Smokeless tobacco: Never Used  Substance and Sexual Activity  . Alcohol use: No  . Drug use: No  . Sexual activity: Not Currently  Other Topics Concern  . Not on file  Social History Narrative  . Not on file   Social Determinants of Health   Financial Resource Strain:   . Difficulty of Paying Living Expenses:   Food Insecurity:   . Worried About Charity fundraiser in the Last Year:   . Arboriculturist in the Last Year:   Transportation Needs:   . Film/video editor (Medical):   Marland Kitchen Lack of Transportation (Non-Medical):   Physical Activity:   . Days of Exercise per Week:   . Minutes of Exercise per Session:   Stress:   . Feeling of Stress :   Social Connections:   . Frequency of Communication with Friends and Family:   . Frequency of Social Gatherings with Friends and Family:   . Attends Religious Services:   . Active Member of Clubs or Organizations:   . Attends Archivist Meetings:   Marland Kitchen Marital Status:      Family History:  The patient's family history includes Heart failure in his mother.   ROS:   Please see the history of present illness.    ROS All other systems reviewed and are negative.   PHYSICAL EXAM:   VS:  BP (!) 160/76   Pulse 77   Ht 5\' 9"  (1.753 m)   Wt 205 lb 9.6 oz (93.3 kg)   SpO2 97%   BMI 30.36 kg/m    GEN: Well  nourished, well developed, in no acute distress  HEENT: normal  Neck: no JVD, carotid bruits, or masses Cardiac: Irregular; no murmurs, rubs, or gallops,no edema  Respiratory:  clear to auscultation bilaterally, normal work of breathing GI: soft, nontender, nondistended, + BS MS: no deformity or atrophy  Skin: warm and dry, no rash Neuro:  Alert and Oriented x 3, Strength and sensation  are intact Psych: euthymic mood, full affect  Wt Readings from Last 3 Encounters:  06/21/19 205 lb 9.6 oz (93.3 kg)  04/22/19 200 lb (90.7 kg)  04/20/19 200 lb (90.7 kg)      Studies/Labs Reviewed:   EKG:  EKG is ordered today.  The ekg ordered today demonstrates sinus rhythm at rate of 77 bpm, PVC and T wave inversion in leads V5 and V6  Recent Labs: No results found for requested labs within last 8760 hours.   Lipid Panel No results found for: CHOL, TRIG, HDL, CHOLHDL, VLDL, LDLCALC, LDLDIRECT  Additional studies/ records that were reviewed today include:   Carotid doppler 09/2018 Summary:  Right Carotid: Velocities in the right ICA are consistent with a high-end  1-39%         stenosis. Calcified plaque limited complete insonation of  the         proximal ICA. Non-hemodynamically significant plaque <50%  noted         in the CCA.   Left Carotid: Velocities in the left ICA are consistent with a 1-39%  stenosis.        Non-hemodynamically significant plaque noted in the CCA. The  ECA        appears >50% stenosed.   LEFT HEART CATH AND CORS/GRAFTS ANGIOGRAPHY 10/2017  Conclusion   Widely patent bypass grafts including sequential saphenous vein graft to PDA and PL, saphenous vein graft to OM, saphenous vein graft to LAD, and LIMA to OM1.  Severe native vessel disease including heavy diffuse calcification in all territories.  Segmental 50% left main.  Ostial occlusion of the right coronary.  Total occlusion of the mid LAD after the first  diagonal.  The LAD beyond the saphenous vein graft insertion into the mid to distal LAD contains calcified relatively focal 95% stenosis.  Total occlusion of the proximal circumflex.  Normal LVEDP  RECOMMENDATIONS:   Continue medical therapy  Four additional hours of IV hydration prior to discharge.  Kidney function on Friday.  Plavix 75 mg/day.  Consider PTCA of LAD via the saphenous vein graft once kidneys recover and if symptoms warrant.    Diagnostic Dominance: Right      ASSESSMENT & PLAN:    1.  Dyspnea on exertion Patient reports progressive worsening shortness of breath with activity without chest discomfort.  His previous dyspnea felt to be anginal equivalent.  Last cath as above. Noted PAC and elevated blood pressure.  Will add beta-blocker to help all 3 component of angina, PAC and blood pressure.  2. Carotid artery disease - Mild plaque bilaterally at 1-39% -Continue aspirin and statin  3. CKD IV -Has not seen nephrology in a long time.  Renal function was 2.11 September 2018  4.  CAD -His dyspnea could be anginal equivalent.  Continue aspirin, Plavix, Imdur and statin.  Add Coreg as above.  Does not require to take sublingual nitroglycerin.  5. HTN -Elevated.  Add beta-blocker.  -Continue Norvasc, hydralazine and Imdur at current dose.   Medication Adjustments/Labs and Tests Ordered: Current medicines are reviewed at length with the patient today.  Concerns regarding medicines are outlined above.  Medication changes, Labs and Tests ordered today are listed in the Patient Instructions below. Patient Instructions  Medication Instructions:  Your physician has recommended you make the following change in your medication:  1.  START Coreg 6.25 taking 1 tablet twice a day   *If you need a refill on your cardiac medications before your next appointment,  please call your pharmacy*   Lab Work: None ordered  If you have labs (blood work) drawn today and  your tests are completely normal, you will receive your results only by: Marland Kitchen MyChart Message (if you have MyChart) OR . A paper copy in the mail If you have any lab test that is abnormal or we need to change your treatment, we will call you to review the results.   Testing/Procedures: None ordered   Follow-Up: At [facility], you and your health needs are our priority.  As part of our continuing mission to provide you with exceptional heart care, we have created designated Provider Care Teams.  These Care Teams include your primary Cardiologist (physician) and Advanced Practice Providers (APPs -  Physician Assistants and Nurse Practitioners) who all work together to provide you with the care you need, when you need it.  We recommend signing up for the patient portal called "MyChart".  Sign up information is provided on this After Visit Summary.  MyChart is used to connect with patients for Virtual Visits (Telemedicine).  Patients are able to view lab/test results, encounter notes, upcoming appointments, etc.  Non-urgent messages can be sent to your provider as well.   To learn more about what you can do with MyChart, go to [email or web address].    Your next appointment:   6 weeks  The format for your next appointment:   In Person  Provider:   You may see Sinclair Grooms, MD or one of the following Advanced Practice Providers on your designated Care Team:    Truitt Merle, NP  Cecilie Kicks, NP  Kathyrn Drown, NP    Other Instructions      Signed, Leanor Kail, [place]  06/21/2019 12:19 PM    [address], [place], [place]  [address] Phone: [phone]; Fax: [phone]

## 2019-06-28 ENCOUNTER — Telehealth: Payer: Self-pay | Admitting: Interventional Cardiology

## 2019-06-28 NOTE — Telephone Encounter (Signed)
Pt c/o BP issue: STAT if pt c/o blurred vision, one-sided weakness or slurred speech  1. What are your last 5 BP readings?  150/62 172/66 181/64   2. Are you having any other symptoms (ex. Dizziness, headache, blurred vision, passed out)? SOB   3. What is your BP issue? Pt was given a new BP medication 06-21-19 to take in addition to his other BP meds. The daughter is concerned because the meds don't seem to be working and his BP is still running high   Pt c/o Shortness Of Breath: STAT if SOB developed within the last 24 hours or pt is noticeably SOB on the phone  1. Are you currently SOB (can you hear that pt is SOB on the phone)? Not really. Daughter said pt is sitting down   2. How long have you been experiencing SOB? Several weeks  3. Are you SOB when sitting or when up moving around? Up and moving around   4. Are you currently experiencing any other symptoms? no

## 2019-06-28 NOTE — Telephone Encounter (Signed)
Spoke with pt who states he saw Brandon Harris last week and started on Carvedilol 6.25mg  bid.  Pt reports his last B/P readings are 150/62, 172/66 and 181/64.  He reports he continues to have some mild to moderate SOB with exertion and with rest.  Pt denies CP or edema at this time.  He is monitoring his Na+ intake and does drink water as well as tea.  Will forward information to PA-C for review and recommendation.

## 2019-06-28 NOTE — Telephone Encounter (Signed)
Will send call to [place] As pt was recently seen by [facility] and looks like BP meds changed.

## 2019-06-29 MED ORDER — HYDRALAZINE HCL 50 MG PO TABS: 50.0000 mg | ORAL_TABLET | Freq: Three times a day (TID) | ORAL | 3 refills | Status: DC

## 2019-06-29 NOTE — Telephone Encounter (Signed)
Instructed Brandon Harris to have the patient INCREASE HYDRALAZINE to 50 mg TID.  Brandon Harris will check his BP daily 2 hours AFTER taking meds and call if BP is consistently over 140/85 after a few days.  They have an appointment with Cecille Rubin at the end of June so no HTN Clinic appointment will be arranged at this time. Brandon Harris was grateful for assistance.

## 2019-06-29 NOTE — Telephone Encounter (Signed)
Attempted to call number given but VM has not been set up yet - unable to leave message.

## 2019-06-29 NOTE — Telephone Encounter (Signed)
Brandon Harris reports the patient started Coreg 6.25 mg BID last week and his BP has not improved. It was 199/60 early this morning. She checks his BP twice a day, but unfortunately she checks it before he takes his morning medications and then at night. In the past week, his BP has ranged from 150s-190s/60s. She rechecked his BP while on the phone (about 1.5 hours after taking meds this AM) and it is now 191/69 and heart rate is 53. He does has some mild SOB (for about 3 weeks-this is why he called and came in the office 5/12), but she states he denies lightheadedness, dizziness, headache, and signs of stroke.  She understands they will be called back with recommendations.

## 2019-06-29 NOTE — Telephone Encounter (Signed)
Recommend increasing hydralazine from 25mg  BID to 50mg  TID.  Can schedule pt for follow up in HTN clinic in the next 2 weeks if he would like.

## 2019-06-29 NOTE — Telephone Encounter (Signed)
Follow up     Brandon Harris is calling back, she says they are concerned with the pts BP readings.    Please advise

## 2019-07-03 ENCOUNTER — Telehealth: Payer: Self-pay | Admitting: Interventional Cardiology

## 2019-07-03 NOTE — Telephone Encounter (Signed)
Pt c/o BP issue: STAT if pt c/o blurred vision, one-sided weakness or slurred speech  1. What are your last 5 BP readings?  173/62 174/63 157/72 179/77    2. Are you having any other symptoms (ex. Dizziness, headache, blurred vision, passed out)? SOB, fatigue  3. What is your BP issue? Patient's daughter states patient's BP has been elevated. Readings above are at least 1 hour after patient takes his medication.    Pt c/o Shortness Of Breath: STAT if SOB developed within the last 24 hours or pt is noticeably SOB on the phone  1. Are you currently SOB (can you hear that pt is SOB on the phone)? N/A  2. How long have you been experiencing SOB? 3 weeks    3. Are you SOB when sitting or when up moving around? Both  4. Are you currently experiencing any other symptoms? Fatigue

## 2019-07-03 NOTE — Telephone Encounter (Signed)
Attempted to contact daughter.  No answer and VM has not been set up.

## 2019-07-04 NOTE — Telephone Encounter (Signed)
Spoke with daughter and she states pt's BP remains elevated and symptoms (SOB, fatigue) have worsened since last visit.  Denies increased salt in diet.  Pt currently scheduled for BP f/u in late June.  Scheduled pt to be seen tomorrow with Richardson Dopp, PA-C for eval.  Daughter appreciative for call.

## 2019-07-05 ENCOUNTER — Other Ambulatory Visit: Payer: Self-pay

## 2019-07-05 ENCOUNTER — Encounter: Payer: Self-pay | Admitting: Physician Assistant

## 2019-07-05 ENCOUNTER — Ambulatory Visit: Payer: Medicare Other | Admitting: Physician Assistant

## 2019-07-05 VITALS — BP 178/76 | HR 76 | Ht 69.0 in | Wt 205.4 lb

## 2019-07-05 DIAGNOSIS — N184 Chronic kidney disease, stage 4 (severe): Secondary | ICD-10-CM

## 2019-07-05 DIAGNOSIS — I25708 Atherosclerosis of coronary artery bypass graft(s), unspecified, with other forms of angina pectoris: Secondary | ICD-10-CM | POA: Diagnosis not present

## 2019-07-05 DIAGNOSIS — I10 Essential (primary) hypertension: Secondary | ICD-10-CM

## 2019-07-05 DIAGNOSIS — E785 Hyperlipidemia, unspecified: Secondary | ICD-10-CM

## 2019-07-05 DIAGNOSIS — R0602 Shortness of breath: Secondary | ICD-10-CM

## 2019-07-05 DIAGNOSIS — I6523 Occlusion and stenosis of bilateral carotid arteries: Secondary | ICD-10-CM

## 2019-07-05 MED ORDER — FUROSEMIDE 20 MG PO TABS
20.0000 mg | ORAL_TABLET | Freq: Every day | ORAL | 3 refills | Status: DC
Start: 2019-07-05 — End: 2019-08-02

## 2019-07-05 NOTE — Patient Instructions (Addendum)
Medication Instructions:   Your physician has recommended you make the following change in your medication:   1) Start Lasix 20 mg, 1 tablet by mouth once a day  *If you need a refill on your cardiac medications before your next appointment, please call your pharmacy*  Lab Work:  Your physician recommends that you return for lab work in 1 week on 07/12/19 for a BMET  If you have labs (blood work) drawn today and your tests are completely normal, you will receive your results only by: Marland Kitchen MyChart Message (if you have MyChart) OR . A paper copy in the mail If you have any lab test that is abnormal or we need to change your treatment, we will call you to review the results.  Testing/Procedures:  Your physician has requested that you have a renal artery duplex. During this test, an ultrasound is used to evaluate blood flow to the kidneys. Allow one hour for this exam. Do not eat after midnight the day before and avoid carbonated beverages. Take your medications as you usually do.  Your physician has requested that you have an echocardiogram. Echocardiography is a painless test that uses sound waves to create images of your heart. It provides your doctor with information about the size and shape of your heart and how well your heart's chambers and valves are working. This procedure takes approximately one hour. There are no restrictions for this procedure.  Follow-Up:  Keep follow up with Truitt Merle, NP on 08/02/19

## 2019-07-05 NOTE — Progress Notes (Signed)
Cardiology Office Note:    Date:  07/05/2019   ID:  [name], DOB 01-31-1929, MRN [id number]  PCP:  Antony Contras, MD  Cardiologist:  Sinclair Grooms, MD   Electrophysiologist:  None   Referring MD: Antony Contras, MD   Chief Complaint:  Follow-up (Uncontrolled Hypertension; Dyspnea; CAD)    Patient Profile:    Brandon Harris is a 86 y.o. male with:   Coronary artery disease  S/p CABG  Cath 9/19: Severe LAD stenosis beyond the LIMA insertion, med Rx  Anginal equivalent: Shortness of breath  Myoview 2017: low risk, normal EF  Hypertension  Hyperlipidemia  Chronic kidney disease, stage IV  Carotid artery disease  [place] 09/2018: bilat ICA 1-39; bilat VA 100  Hx of CVA  GERD  Prior CV studies: Carotid US 09/14/18 Bilateral ICA 1-39; bilateral vertebral arteries occluded  Cardiac catheterization 10/27/17 LAD proximal 100, mid 90, 95 (beyond graft insertion), distal 50 LCx 100 RCA ostial 100, distal 100 SVG-LAD patent SVG-OM2 patent SVG-distal RCA, RPL 2 patent LIMA-OM1 patent   Myoview 06/10/15 EF 65, small apical thinning artifact, o/w normal perfusion; low risk   History of Present Illness:    Mr. Goffe was last seen in clinic by Leanor Kail, PA-C 06/21/19.  His blood pressure was uncontrolled and he was placed on carvedilol.  The patient's blood pressure has not improved.  His hydralazine was increased over the phone.  He notes worsening symptoms of shortness of breath and was therefore scheduled for earlier follow-up with me today.       He is here with his daughter.  I took care of his wife in the past before she passed away.  Mr. Carver has a hx of dyspnea on exertion that is his anginal equivalent.  He has noted more shortness of breath with less activity over the past 1 year.  He has not had chest pain.  He has not had orthopnea, leg swelling or syncope.  He limits his salt.    Past Medical History:  Diagnosis Date  . Arthritis   . Carotid  arterial disease ([place])   . Chronic kidney disease (CKD), stage IV (severe) ([place])   . Diverticulosis    colonic diverticulosis  . Gallstones   . GERD (gastroesophageal reflux disease)   . Hypertension   . Myocardial infarction ([place])   . Prostate cancer ([place])   . Status post coronary artery bypass grafting     intraoperative cholangiogram  . Stroke [facility])    patient reports no deficits   . Urinary incontinence    bladder leakage - " patient wears a pad"    Current Medications: Current Meds  Medication Sig  . ALPRAZolam (XANAX) 0.25 MG tablet Take 0.25 mg by mouth at bedtime as needed for anxiety or sleep. TAKE ONE TABLET AT NIGHT  . amitriptyline (ELAVIL) 10 MG tablet Take 20 mg by mouth in the morning and at bedtime.   Marland Kitchen amLODipine (NORVASC) 10 MG tablet Take 10 mg by mouth daily.  Marland Kitchen aspirin EC 81 MG tablet Take 1 tablet (81 mg total) by mouth daily.  Marland Kitchen atorvastatin (LIPITOR) 80 MG tablet Take 80 mg by mouth every evening.   . carvedilol (COREG) 6.25 MG tablet Take 1 tablet (6.25 mg total) by mouth 2 (two) times daily.  . hydrALAZINE (APRESOLINE) 50 MG tablet Take 1 tablet (50 mg total) by mouth 3 (three) times daily.  . isosorbide mononitrate (IMDUR) 60 MG 24 hr tablet Take 1  tablet (60 mg total) by mouth daily.  . Melatonin 10 MG TABS Take 10 mg by mouth at bedtime.  . mirtazapine (REMERON) 7.5 MG tablet Take 7.5 mg by mouth at bedtime.   . nitroGLYCERIN (NITROSTAT) 0.4 MG SL tablet Place 1 tablet (0.4 mg total) under the tongue every 5 (five) minutes as needed for chest pain.  . pantoprazole (PROTONIX) 40 MG tablet TAKE ONE TABLET BY MOUTH EVERY MORNING  . tamsulosin (FLOMAX) 0.4 MG CAPS capsule Take 0.8 mg by mouth daily. Take two (2) capsules (0.8 mg) by mouth daily.   Marland Kitchen zolpidem (AMBIEN) 10 MG tablet Take 10 mg by mouth at bedtime.      Allergies:   Patient has no known allergies.   Social History   Tobacco Use  . Smoking status: Never Smoker  . Smokeless tobacco: Never  Used  Substance Use Topics  . Alcohol use: No  . Drug use: No     Family Hx: The patient's family history includes Heart failure in his mother.  ROS   EKGs/Labs/Other Test Reviewed:    EKG:  EKG is not ordered today.  The ekg ordered today demonstrates n/a  Recent Labs: No results found for requested labs within last 8760 hours.   Recent Lipid Panel No results found for: CHOL, TRIG, HDL, CHOLHDL, LDLCALC, LDLDIRECT  Physical Exam:    VS:  BP (!) 178/76   Pulse 76   Ht 5\' 9"  (1.753 m)   Wt 205 lb 6.4 oz (93.2 kg)   SpO2 95%   BMI 30.33 kg/m     Wt Readings from Last 3 Encounters:  07/05/19 205 lb 6.4 oz (93.2 kg)  06/21/19 205 lb 9.6 oz (93.3 kg)  04/22/19 200 lb (90.7 kg)     Constitutional:      Appearance: Healthy appearance. Not in distress.  Neck:     Thyroid: No thyromegaly.     Vascular: JVD normal.  Pulmonary:     Effort: Pulmonary effort is normal.     Breath sounds: No wheezing. No rales.  Cardiovascular:     Normal rate. Regular rhythm. Normal S1. Normal S2.     Murmurs: There is no murmur.  Edema:    Peripheral edema absent.  Abdominal:     Palpations: Abdomen is soft. There is no hepatomegaly.     Comments: No obvious bruits  Skin:    General: Skin is warm and dry.  Neurological:     General: No focal deficit present.     Mental Status: Alert and oriented to person, place and time.     Cranial Nerves: Cranial nerves are intact.       ASSESSMENT & PLAN:    1. Essential hypertension 2. Shortness of breath His blood pressure has become more difficult to control.  He also has shortness of breath with exertion which has progressed over the past year.  Shortness of breath has always been his anginal equivalent.  However, with his advanced chronic kidney disease, he would be high risk for contrast-induced nephropathy.  I reviewed his case today with Dr. Tamala Julian.  He agrees that we should not proceed with cardiac catheterization unless it is  emergent.  Given his chronic kidney disease, he likely has some volume overload.  His exam actually does not demonstrate overt heart failure.  However, we will try him on a low-dose of furosemide 20 mg daily.  Obtain a follow-up BMET in 1 week.  I will also arrange an echocardiogram to  assess LV systolic and diastolic function as well as pulmonary pressures.  Keep follow-up with Truitt Merle, NP in 4 weeks.   3. Coronary artery disease of bypass graft of native heart with stable angina pectoris ([place]) History of CABG.  Cardiac catheterization 2019 with patent bypass grafts.  He did have severe disease in the LAD past the LIMA insertion.  This has been managed medically.  As noted, I reviewed his case with Dr. Tamala Julian.  We will continue to try to manage him medically and avoid cardiac catheterization due to his advanced age and poor renal function.  Continue aspirin, carvedilol, amlodipine, isosorbide, atorvastatin.  4. CKD (chronic kidney disease) stage 4, GFR 15-29 ml/min (HCC) Most recent creatinine I can find is 2.2 in August 2020.  Repeat BMP in 1 week after starting furosemide.  I will also obtain renal arterial Dopplers.  If he has significant bilateral renal artery stenosis, he may be a candidate for intervention to help improve his blood pressure.  5. Hyperlipidemia LDL goal <70 Continue high intensity statin therapy.   Dispo:  Return in 4 weeks (on 08/02/2019) for Scheduled Follow Up w/ Truitt Merle, NP.   Medication Adjustments/Labs and Tests Ordered: Current medicines are reviewed at length with the patient today.  Concerns regarding medicines are outlined above.  Tests Ordered: Orders Placed This Encounter  Procedures  . Basic metabolic panel  . ECHOCARDIOGRAM COMPLETE  . VAS US RENAL ARTERY DUPLEX   Medication Changes: Meds ordered this encounter  Medications  . furosemide (LASIX) 20 MG tablet    Sig: Take 1 tablet (20 mg total) by mouth daily.    Dispense:  30 tablet     Refill:  3    Signed, Richardson Dopp, PA-C  07/05/2019 5:19 PM    [facility] [place], [place], [place]  [address] Phone: [phone]; Fax: [phone]

## 2019-07-12 ENCOUNTER — Other Ambulatory Visit: Payer: Medicare Other

## 2019-07-13 ENCOUNTER — Other Ambulatory Visit: Payer: Self-pay | Admitting: *Deleted

## 2019-07-13 DIAGNOSIS — I779 Disorder of arteries and arterioles, unspecified: Secondary | ICD-10-CM

## 2019-07-14 ENCOUNTER — Ambulatory Visit (HOSPITAL_COMMUNITY)
Admission: RE | Admit: 2019-07-14 | Discharge: 2019-07-14 | Disposition: A | Discharge status: Home / Self Care | Payer: Medicare Other | Source: Ambulatory Visit | Attending: Cardiovascular Disease | Admitting: Cardiovascular Disease

## 2019-07-14 ENCOUNTER — Other Ambulatory Visit: Payer: Self-pay

## 2019-07-14 ENCOUNTER — Telehealth: Payer: Self-pay | Admitting: Interventional Cardiology

## 2019-07-14 DIAGNOSIS — E785 Hyperlipidemia, unspecified: Secondary | ICD-10-CM

## 2019-07-14 DIAGNOSIS — N184 Chronic kidney disease, stage 4 (severe): Secondary | ICD-10-CM

## 2019-07-14 DIAGNOSIS — I6523 Occlusion and stenosis of bilateral carotid arteries: Secondary | ICD-10-CM

## 2019-07-14 DIAGNOSIS — I10 Essential (primary) hypertension: Secondary | ICD-10-CM

## 2019-07-14 DIAGNOSIS — I25708 Atherosclerosis of coronary artery bypass graft(s), unspecified, with other forms of angina pectoris: Secondary | ICD-10-CM

## 2019-07-14 LAB — BASIC METABOLIC PANEL
BUN/Creatinine Ratio: 12 (ref 10–24)
BUN: 24 mg/dL (ref 8–27)
CO2: 20 mmol/L (ref 20–29)
Calcium: 8.8 mg/dL (ref 8.6–10.2)
Chloride: 106 mmol/L (ref 96–106)
Creatinine, Ser: 2.05 mg/dL — ABNORMAL HIGH (ref 0.76–1.27)
GFR calc Af Amer: 32 mL/min/{1.73_m2} — ABNORMAL LOW (ref >59)
GFR calc non Af Amer: 28 mL/min/{1.73_m2} — ABNORMAL LOW (ref >59)
Glucose: 95 mg/dL (ref 65–99)
Potassium: 4.7 mmol/L (ref 3.5–5.2)
Sodium: 141 mmol/L (ref 134–144)

## 2019-07-14 NOTE — Telephone Encounter (Signed)
Brandon Harris from [place] wanted a message sent to [place] but then she figured out the medication issue while on the phone with me so no note is needed.

## 2019-07-18 ENCOUNTER — Other Ambulatory Visit: Payer: Self-pay

## 2019-07-18 DIAGNOSIS — I701 Atherosclerosis of renal artery: Secondary | ICD-10-CM

## 2019-07-26 ENCOUNTER — Other Ambulatory Visit: Payer: Self-pay

## 2019-07-26 ENCOUNTER — Ambulatory Visit (HOSPITAL_COMMUNITY): Payer: Medicare Other | Attending: Cardiovascular Disease

## 2019-07-26 DIAGNOSIS — I25708 Atherosclerosis of coronary artery bypass graft(s), unspecified, with other forms of angina pectoris: Secondary | ICD-10-CM | POA: Diagnosis not present

## 2019-07-26 DIAGNOSIS — R0602 Shortness of breath: Secondary | ICD-10-CM

## 2019-07-26 NOTE — Progress Notes (Signed)
CARDIOLOGY OFFICE NOTE  Date:  08/02/2019    Brandon Harris Date of Birth: Sep 27, 1928 Medical Record #[id number]  PCP:  Antony Contras, MD  Cardiologist:  Tamala Julian  Chief Complaint  Patient presents with  . Follow-up    History of Present Illness: Brandon Harris is a 86 y.o. male who presents today for a follow up visit. Seen for Dr. Tamala Julian.   He has a history of known CAD with prior CABG, follow up cath in 2019 with severe LAD stenosis beyond LIMA insertion - managed medically, low risk Myoview in 2017. Other issues include HTN, HLD, CKD stage IV, carotid disease prior CVA and GERD.   Last seen by Dr. Tamala Julian by telehealth in September of 2020. Saw Richardson Dopp, [place] in May - had had BP issues - had had Coreg added and Hydralazine increased. Noted more DOE which has been felt to be his angina equivalent.Felt to best be managed medically given CKD and to avoid cath. Lasix was added. Echo to be updated which turned out ok - normal EF.   The patient does not have symptoms concerning for COVID-19 infection (fever, chills, cough, or new shortness of breath).   Comes in today. Here with his daughter Brandon Harris. He is feeling better. His shortness of breath is "much" better. No chest pain. He has stopped his Lasix. He has no swelling. BP is running in the 130's at home - rarely in the 140's. He is not dizzy or lightheaded. He has had no falls. He was seen by Dr. Fletcher Anon yesterday - no further testing felt to be warranted. BP was fair there. He is happy with how he is doing. 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   Past Medical History:  Diagnosis Date  . Arthritis   . Carotid arterial disease ([place])   . Chronic kidney disease (CKD), stage IV (severe) ([place])   . Diverticulosis    colonic diverticulosis  . Gallstones   . GERD (gastroesophageal reflux disease)   . Hypertension   . Myocardial infarction ([place])   . Prostate cancer ([place])   . Status post coronary artery bypass grafting     intraoperative  cholangiogram  . Stroke [facility])    patient reports no deficits   . Urinary incontinence    bladder leakage - " patient wears a pad"    Past Surgical History:  Procedure Laterality Date  . BACK SURGERY     x 4, has titanium in back per patient   . CHOLECYSTECTOMY, LAPAROSCOPIC    . CORONARY ARTERY BYPASS GRAFT  2001  . CRYOTHERAPY     Prostate Cancer  . LEFT HEART CATH AND CORS/GRAFTS ANGIOGRAPHY N/A 10/27/2017   Procedure: LEFT HEART CATH AND CORS/GRAFTS ANGIOGRAPHY;  Surgeon: Belva Crome, MD;  Location: [facility];  Service: Cardiovascular;  Laterality: N/A;  . prostate cancer    . ROTATOR CUFF REPAIR     LEFT  . TOTAL KNEE ARTHROPLASTY Right 12/18/2014   Procedure: TOTAL RIGHT KNEE ARTHROPLASTY;  Surgeon: Paralee Cancel, MD;  Location: [facility];  Service: Orthopedics;  Laterality: Right;     Medications: Current Meds  Medication Sig  . ALPRAZolam (XANAX) 0.25 MG tablet Take 0.25 mg by mouth at bedtime as needed for anxiety or sleep. TAKE ONE TABLET AT NIGHT  . amitriptyline (ELAVIL) 10 MG tablet Take 20 mg by mouth in the morning and at bedtime.   Marland Kitchen amLODipine (NORVASC) 5 MG tablet Take 5 mg by  mouth every morning.  Marland Kitchen aspirin EC 81 MG tablet Take 1 tablet (81 mg total) by mouth daily.  Marland Kitchen atorvastatin (LIPITOR) 80 MG tablet Take 80 mg by mouth every evening.   . carvedilol (COREG) 6.25 MG tablet Take 1 tablet (6.25 mg total) by mouth 2 (two) times daily.  . clopidogrel (PLAVIX) 75 MG tablet Take 75 mg by mouth every morning.  . hydrALAZINE (APRESOLINE) 50 MG tablet Take 1 tablet (50 mg total) by mouth 3 (three) times daily.  . isosorbide mononitrate (IMDUR) 60 MG 24 hr tablet Take 1 tablet (60 mg total) by mouth daily.  . melatonin 5 MG TABS Take 5 mg by mouth at bedtime.  . mirtazapine (REMERON) 7.5 MG tablet Take 7.5 mg by mouth at bedtime.   . nitroGLYCERIN (NITROSTAT) 0.4 MG SL tablet Place 1 tablet (0.4 mg total) under the tongue every 5 (five) minutes as needed for  chest pain.  . pantoprazole (PROTONIX) 40 MG tablet TAKE ONE TABLET BY MOUTH EVERY MORNING  . tamsulosin (FLOMAX) 0.4 MG CAPS capsule Take 0.8 mg by mouth daily. Take two (2) capsules (0.8 mg) by mouth daily.   Marland Kitchen zolpidem (AMBIEN) 10 MG tablet Take 10 mg by mouth at bedtime.   . [DISCONTINUED] amLODipine (NORVASC) 10 MG tablet Take 10 mg by mouth daily.  . [DISCONTINUED] Melatonin 10 MG TABS Take 10 mg by mouth at bedtime.     Allergies: No Known Allergies  Social History: The patient  reports that he has never smoked. He has never used smokeless tobacco. He reports that he does not drink alcohol and does not use drugs.   Family History: The patient's family history includes Heart failure in his mother.   Review of Systems: Please see the history of present illness.   All other systems are reviewed and negative.   Physical Exam: VS:  BP 134/80   Pulse 67   Ht 5\' 11"  (1.803 m)   Wt 201 lb 12.8 oz (91.5 kg)   SpO2 98%   BMI 28.15 kg/m  .  BMI Body mass index is 28.15 kg/m.  Wt Readings from Last 3 Encounters:  08/02/19 201 lb 12.8 oz (91.5 kg)  08/01/19 201 lb (91.2 kg)  07/05/19 205 lb 6.4 oz (93.2 kg)   BP recheck by me is 144/80.  General: Elderly. Alert and in no acute distress.  Using a cane.  Cardiac: Regular rate and rhythm. Occasional ectopic. No edema.  Respiratory:  Lungs are clear to auscultation bilaterally with normal work of breathing.  GI: Soft and nontender.  MS: No deformity or atrophy. Gait and ROM intact. Little kyphotic.  Skin: Warm and dry. Color is normal.  Neuro:  Strength and sensation are intact and no gross focal deficits noted.  Psych: Alert, appropriate and with normal affect.   LABORATORY DATA:  EKG:  EKG is not ordered today.    Lab Results  Component Value Date   WBC 8.9 10/26/2017   HGB 12.6 (L) 10/26/2017   HCT 37.1 (L) 10/26/2017   PLT 267 10/26/2017   GLUCOSE 95 07/14/2019   NA 141 07/14/2019   K 4.7 07/14/2019   CL 106  07/14/2019   CREATININE 2.05 (H) 07/14/2019   BUN 24 07/14/2019   CO2 20 07/14/2019   INR 1.02 12/10/2014       BNP (last 3 results) No results for input(s): BNP in the last 8760 hours.  ProBNP (last 3 results) No results for input(s): PROBNP in the  last 8760 hours.   Other Studies Reviewed Today:  ECHO IMPRESSIONS 07/2019  1. Left ventricular ejection fraction, by estimation, is 55 to 60%. The  left ventricle has normal function. The left ventricle has no regional  wall motion abnormalities. Left ventricular diastolic parameters are  consistent with Grade I diastolic  dysfunction (impaired relaxation).  2. Right ventricular systolic function is normal. The right ventricular  size is mildly enlarged.  3. The mitral valve is grossly normal. Mild mitral valve regurgitation.  No evidence of mitral stenosis.  4. The aortic valve is tricuspid. Aortic valve regurgitation is mild.  Mild aortic valve sclerosis is present, with no evidence of aortic valve  stenosis.   Theora Gianotti, NP  07/26/2019 3:48 PM EDT     Normal heart squeezing function, though heart muscle is mildly stiff. Stiffness is common in patients with high blood pressure and just tells [place] that we must really focus on normalizing his bp as best we can. The mitral valve is mildly leaky. No significant valvular dzs.    Carotid US 09/14/18 Bilateral ICA 1-39; bilateral vertebral arteries occluded  Cardiac catheterization 10/27/17 LAD proximal 100, mid 90, 95 (beyond graft insertion), distal 50 LCx 100 RCA ostial 100, distal 100 SVG-LAD patent SVG-OM2 patent SVG-distal RCA, RPL 2 patent LIMA-OM1 patent   Myoview 06/10/15 EF 65, small apical thinning artifact, o/w normal perfusion; low risk    ASSESSMENT & PLAN:    1. HTN - has had more difficult to control BP recently - now much better with change in medicines - no further changes made today. He will continue to monitor at home.   2.  Shortness of breath - typically his angina equivalent - this is much improved with the improvement in BP. No further testing warranted. EF is normal by recent echo - some diastolic dysfunction noted.   3. CKD - stage 4 - he has had renal dopplers - has seen Dr. Fletcher Anon - no further work up felt to be indicated - has better BP control.   4. CAD - remote CABG - last cath in 2019 - grafts are patent but with severe disease in the LAD past the LIMA insertion - managed medically. We will continue aspirin, Coreg, Norvasc, Lipitor and Imdur.   5. HLD - on statin therapy.   6. Advanced age - still doing pretty well.   Current medicines are reviewed with the patient today.  The patient does not have concerns regarding medicines other than what has been noted above.  The following changes have been made:  See above.  Labs/ tests ordered today include:   No orders of the defined types were placed in this encounter.    Disposition:   FU with Dr. Tamala Julian in 4 months.    Patient is agreeable to this plan and will call if any problems develop in the interim.   SignedTruitt Merle, NP  08/02/2019 12:25 PM  [facility] [address] [place], [place]  [address] Phone: [phone] Fax: [phone]

## 2019-07-31 ENCOUNTER — Telehealth: Payer: Self-pay | Admitting: Interventional Cardiology

## 2019-07-31 NOTE — Telephone Encounter (Signed)
The patients daughter Thayer Headings has been notified of the result and verbalized understanding. Ok to speak with Thayer Headings per patient DPR. All questions (if any) were answered. Mady Haagensen, [place] 07/31/2019 11:34 AM

## 2019-07-31 NOTE — Telephone Encounter (Signed)
New message   Patient's daughter is returning call for echo results. Please call.

## 2019-08-01 ENCOUNTER — Other Ambulatory Visit: Payer: Self-pay

## 2019-08-01 ENCOUNTER — Ambulatory Visit: Payer: Medicare Other | Admitting: Cardiovascular Disease

## 2019-08-01 ENCOUNTER — Encounter: Payer: Self-pay | Admitting: Cardiovascular Disease

## 2019-08-01 VITALS — BP 148/64 | HR 69 | Ht 71.0 in | Wt 201.0 lb

## 2019-08-01 DIAGNOSIS — I2510 Atherosclerotic heart disease of native coronary artery without angina pectoris: Secondary | ICD-10-CM

## 2019-08-01 DIAGNOSIS — K551 Chronic vascular disorders of intestine: Secondary | ICD-10-CM | POA: Diagnosis not present

## 2019-08-01 DIAGNOSIS — I10 Essential (primary) hypertension: Secondary | ICD-10-CM | POA: Diagnosis not present

## 2019-08-01 DIAGNOSIS — I701 Atherosclerosis of renal artery: Secondary | ICD-10-CM | POA: Diagnosis not present

## 2019-08-01 NOTE — Patient Instructions (Signed)

## 2019-08-01 NOTE — Progress Notes (Signed)
Cardiology Office Note   Date:  08/01/2019   ID:  Brandon Harris, DOB Jan 11, 1929, MRN [id number]  PCP:  Antony Contras, MD  Cardiologist:  Dr. Tamala Julian  No chief complaint on file.     History of Present Illness: Brandon Harris is a 86 y.o. male who was referred by Dr. Tamala Julian and Richardson Dopp for evaluation of renal artery stenosis.  He has known history of coronary artery disease status post CABG, essential hypertension, hyperlipidemia, stage IV chronic kidney disease, mild bilateral carotid disease, history of CVA and GERD. He has known history of refractory hypertension.  He had recent worsening of shortness of breath which possibly could be angina equivalent but there was concern about proceeding with catheterization given advanced chronic kidney disease.  He had recent echocardiogram which showed normal LV systolic function, grade 1 diastolic dysfunction and mild aortic sclerosis. He had recent renal artery duplex which showed significant disease affecting the celiac artery and proximal SMA.  There was evidence of only mild bilateral renal artery stenosis less than 60%.  The patient was switched to carvedilol and the dose of hydralazine was increased.  Since that time, his blood pressure became more controlled and he actually feels better with less shortness of breath. He denies any postprandial abdominal pain.  He has good appetite and has not lost any weight.  No chest pain.  Past Medical History:  Diagnosis Date  . Arthritis   . Carotid arterial disease ([place])   . Chronic kidney disease (CKD), stage IV (severe) ([place])   . Diverticulosis    colonic diverticulosis  . Gallstones   . GERD (gastroesophageal reflux disease)   . Hypertension   . Myocardial infarction ([place])   . Prostate cancer ([place])   . Status post coronary artery bypass grafting     intraoperative cholangiogram  . Stroke [facility])    patient reports no deficits   . Urinary incontinence    bladder leakage - " patient  wears a pad"    Past Surgical History:  Procedure Laterality Date  . BACK SURGERY     x 4, has titanium in back per patient   . CHOLECYSTECTOMY, LAPAROSCOPIC    . CORONARY ARTERY BYPASS GRAFT  2001  . CRYOTHERAPY     Prostate Cancer  . LEFT HEART CATH AND CORS/GRAFTS ANGIOGRAPHY N/A 10/27/2017   Procedure: LEFT HEART CATH AND CORS/GRAFTS ANGIOGRAPHY;  Surgeon: Belva Crome, MD;  Location: [facility];  Service: Cardiovascular;  Laterality: N/A;  . prostate cancer    . ROTATOR CUFF REPAIR     LEFT  . TOTAL KNEE ARTHROPLASTY Right 12/18/2014   Procedure: TOTAL RIGHT KNEE ARTHROPLASTY;  Surgeon: Paralee Cancel, MD;  Location: [facility];  Service: Orthopedics;  Laterality: Right;     Current Outpatient Medications  Medication Sig Dispense Refill  . ALPRAZolam (XANAX) 0.25 MG tablet Take 0.25 mg by mouth at bedtime as needed for anxiety or sleep. TAKE ONE TABLET AT NIGHT    . amitriptyline (ELAVIL) 10 MG tablet Take 20 mg by mouth in the morning and at bedtime.   0  . amLODipine (NORVASC) 10 MG tablet Take 10 mg by mouth daily.    Marland Kitchen aspirin EC 81 MG tablet Take 1 tablet (81 mg total) by mouth daily. 90 tablet 3  . atorvastatin (LIPITOR) 80 MG tablet Take 80 mg by mouth every evening.     . carvedilol (COREG) 6.25 MG tablet Take 1 tablet (6.25 mg total)  by mouth 2 (two) times daily. 60 tablet 3  . furosemide (LASIX) 20 MG tablet Take 1 tablet (20 mg total) by mouth daily. 30 tablet 3  . hydrALAZINE (APRESOLINE) 50 MG tablet Take 1 tablet (50 mg total) by mouth 3 (three) times daily. 270 tablet 3  . isosorbide mononitrate (IMDUR) 60 MG 24 hr tablet Take 1 tablet (60 mg total) by mouth daily. 90 tablet 3  . Melatonin 10 MG TABS Take 10 mg by mouth at bedtime.    . mirtazapine (REMERON) 7.5 MG tablet Take 7.5 mg by mouth at bedtime.     . nitroGLYCERIN (NITROSTAT) 0.4 MG SL tablet Place 1 tablet (0.4 mg total) under the tongue every 5 (five) minutes as needed for chest pain. 25 tablet 6    . pantoprazole (PROTONIX) 40 MG tablet TAKE ONE TABLET BY MOUTH EVERY MORNING 90 tablet 0  . tamsulosin (FLOMAX) 0.4 MG CAPS capsule Take 0.8 mg by mouth daily. Take two (2) capsules (0.8 mg) by mouth daily.   11  . zolpidem (AMBIEN) 10 MG tablet Take 10 mg by mouth at bedtime.      No current facility-administered medications for this visit.    Allergies:   Patient has no known allergies.    Social History:  The patient  reports that he has never smoked. He has never used smokeless tobacco. He reports that he does not drink alcohol and does not use drugs.   Family History:  The patient's family history includes Heart failure in his mother.    ROS:  Please see the history of present illness.   Otherwise, review of systems are positive for none.   All other systems are reviewed and negative.    PHYSICAL EXAM: VS:  BP (!) 148/64   Pulse 69   Ht 5\' 11"  (1.803 m)   Wt 201 lb (91.2 kg)   SpO2 97%   BMI 28.03 kg/m  , BMI Body mass index is 28.03 kg/m. GEN: Well nourished, well developed, in no acute distress  HEENT: normal  Neck: no JVD, , or masses.  Bilateral carotid bruits Cardiac: RRR; no murmurs, rubs, or gallops,no edema  Respiratory:  clear to auscultation bilaterally, normal work of breathing GI: soft, nontender, nondistended, + BS MS: no deformity or atrophy  Skin: warm and dry, no rash Neuro:  Strength and sensation are intact Psych: euthymic mood, full affect   EKG:  EKG is not ordered today.    Recent Labs: 07/14/2019: BUN 24; Creatinine, Ser 2.05; Potassium 4.7; Sodium 141    Lipid Panel No results found for: CHOL, TRIG, HDL, CHOLHDL, VLDL, LDLCALC, LDLDIRECT    Wt Readings from Last 3 Encounters:  08/01/19 201 lb (91.2 kg)  07/05/19 205 lb 6.4 oz (93.2 kg)  06/21/19 205 lb 9.6 oz (93.3 kg)       No flowsheet data found.    ASSESSMENT AND PLAN:  1.  Mild bilateral renal artery stenosis.  Duplex was suggestive of less than 60% stenosis  bilaterally.  This is not hemodynamically significant.  His blood pressure also improved after adjusting his antihypertensive medications.  No indication for renal artery angiography or revascularization.  I recommend continuing medical therapy.  2.  Celiac artery/SMA stenosis: This was also incidental finding.  The patient clearly does not have symptoms of chronic mesenteric ischemia.  He has no postprandial abdominal pain, has good appetite and has not lost any weight.  No indication for revascularization.  3.  Coronary artery  disease involving native coronary arteries.  Recent worsening of shortness of breath thought to be possibly anginal equivalent.  However, he reports improvement in symptoms since his blood pressure is more controlled.  4.  Essential hypertension: Blood pressure improved significantly after adjusting his medications.  5.  Bilateral carotid bruits: Most recent carotid Doppler in August of last year showed mild nonobstructive disease bilaterally.    Disposition:   FU with me as needed.  Signed,  Kathlyn Sacramento, MD  08/01/2019 9:03 AM    [place]

## 2019-08-02 ENCOUNTER — Encounter: Payer: Self-pay | Admitting: Nurse Practitioner

## 2019-08-02 ENCOUNTER — Ambulatory Visit: Payer: Medicare Other | Admitting: Nurse Practitioner

## 2019-08-02 VITALS — BP 134/80 | HR 67 | Ht 71.0 in | Wt 201.8 lb

## 2019-08-02 DIAGNOSIS — E785 Hyperlipidemia, unspecified: Secondary | ICD-10-CM

## 2019-08-02 DIAGNOSIS — I2510 Atherosclerotic heart disease of native coronary artery without angina pectoris: Secondary | ICD-10-CM

## 2019-08-02 DIAGNOSIS — R0602 Shortness of breath: Secondary | ICD-10-CM | POA: Diagnosis not present

## 2019-08-02 DIAGNOSIS — I10 Essential (primary) hypertension: Secondary | ICD-10-CM

## 2019-08-02 NOTE — Patient Instructions (Signed)
After Visit Summary:  We will be checking the following labs today - NONE   Medication Instructions:    Continue with your current medicines.    If you need a refill on your cardiac medications before your next appointment, please call your pharmacy.     Testing/Procedures To Be Arranged:  N/A  Follow-Up:   See Dr. Tamala Julian in about 4 months    At [facility], you and your health needs are our priority.  As part of our continuing mission to provide you with exceptional heart care, we have created designated Provider Care Teams.  These Care Teams include your primary Cardiologist (physician) and Advanced Practice Providers (APPs -  Physician Assistants and Nurse Practitioners) who all work together to provide you with the care you need, when you need it.  Special Instructions:  . Stay safe, wash your hands for at least 20 seconds and wear a mask when needed.  . It was good to talk with you both today.  Marland Kitchen Keep a check on your blood pressure for [place].  . I think you are doing well.    Call the [place] office at [phone] if you have any questions, problems or concerns.

## 2019-08-03 ENCOUNTER — Other Ambulatory Visit: Payer: Self-pay | Admitting: Interventional Cardiology

## 2019-08-10 ENCOUNTER — Other Ambulatory Visit: Payer: Self-pay | Admitting: Interventional Cardiology

## 2019-08-28 ENCOUNTER — Other Ambulatory Visit: Payer: Self-pay | Admitting: *Deleted

## 2019-08-28 ENCOUNTER — Telehealth: Payer: Self-pay | Admitting: Interventional Cardiology

## 2019-08-28 MED ORDER — HYDRALAZINE HCL 25 MG PO TABS
25.0000 mg | ORAL_TABLET | Freq: Three times a day (TID) | ORAL | 3 refills | Status: DC
Start: 2019-08-28 — End: 2019-11-30

## 2019-08-28 NOTE — Telephone Encounter (Signed)
Pt c/o BP issue: STAT if pt c/o blurred vision, one-sided weakness or slurred speech  1. What are your last 5 BP readings?  08/27/19 7:24 PM 172/64, 151/75,  08/26/19 179/67 08/25/19 176/70  08/24/19 157/54    2. Are you having any other symptoms (ex. Dizziness, headache, blurred vision, passed out)? Not really just feels a little different   3. What is your BP issue? Brandon Harris is calling to inform Brandon Harris's BP is starting to go back up again. She states he doesn't report any symptoms besides feeling a little different. Please advise.

## 2019-08-28 NOTE — Telephone Encounter (Signed)
Let's make sure first he is taking his Hydralazine three times a day -  if so, increase to 75 mg TID.   Cecille Rubin

## 2019-08-28 NOTE — Progress Notes (Signed)
Error

## 2019-08-28 NOTE — Telephone Encounter (Signed)
S/w Thayer Headings, pt daughter per (DPR).  Is agreeable to recommendations and will increase hydralazine one tablet ( 50 mg) and (25 mg) tablet to equal ( 75 mg) tid. Sent in to requested pharmacy. Will keep track of bp and send a mychart message next week.

## 2019-08-28 NOTE — Telephone Encounter (Signed)
S/w daughter Thayer Headings per (DPR) took bp while on phone 156/69  HR 59, typically bp increases around 5-6 and pt can feel its higher. Denies headache, dizziness, sob, visual disturbance, passing out.   Pt was experiencing SOB in the past with elevated pressure but not now. Will send to Cecille Rubin to advise.

## 2019-09-14 ENCOUNTER — Other Ambulatory Visit (HOSPITAL_COMMUNITY): Payer: Self-pay | Admitting: Interventional Cardiology

## 2019-09-14 ENCOUNTER — Other Ambulatory Visit: Payer: Self-pay

## 2019-09-14 ENCOUNTER — Ambulatory Visit (HOSPITAL_COMMUNITY)
Admission: RE | Admit: 2019-09-14 | Discharge: 2019-09-14 | Disposition: A | Discharge status: Home / Self Care | Payer: Medicare Other | Source: Ambulatory Visit | Attending: Cardiovascular Disease | Admitting: Cardiovascular Disease

## 2019-09-14 DIAGNOSIS — I6523 Occlusion and stenosis of bilateral carotid arteries: Secondary | ICD-10-CM

## 2019-09-14 DIAGNOSIS — I779 Disorder of arteries and arterioles, unspecified: Secondary | ICD-10-CM | POA: Diagnosis present

## 2019-10-01 ENCOUNTER — Other Ambulatory Visit: Payer: Self-pay | Admitting: Physician Assistant

## 2019-10-26 ENCOUNTER — Other Ambulatory Visit (HOSPITAL_COMMUNITY): Payer: Self-pay | Admitting: Urology

## 2019-10-26 DIAGNOSIS — R9721 Rising PSA following treatment for malignant neoplasm of prostate: Secondary | ICD-10-CM

## 2019-11-08 ENCOUNTER — Ambulatory Visit (HOSPITAL_COMMUNITY)
Admission: RE | Admit: 2019-11-08 | Discharge: 2019-11-08 | Disposition: A | Discharge status: Home / Self Care | Payer: Medicare Other | Source: Ambulatory Visit | Attending: Urology | Admitting: Urology

## 2019-11-08 ENCOUNTER — Other Ambulatory Visit: Payer: Self-pay

## 2019-11-08 DIAGNOSIS — R9721 Rising PSA following treatment for malignant neoplasm of prostate: Secondary | ICD-10-CM | POA: Diagnosis present

## 2019-11-08 MED ORDER — AXUMIN (FLUCICLOVINE F 18) INJECTION
10.7000 | Freq: Once | INTRAVENOUS | Status: AC | PRN
  Administered 2019-11-08: 10.7 via INTRAVENOUS

## 2019-11-19 NOTE — Progress Notes (Signed)
Cardiology Office Note:    Date:  11/24/2019   ID:  Brandon Harris, DOB 1928-05-01, MRN [id number]  PCP:  Antony Contras, MD  Cardiologist:  Sinclair Grooms, MD   Referring MD: Antony Contras, MD   Chief Complaint  Patient presents with  . Coronary Artery Disease  . Hyperlipidemia  . Hypertension    History of Present Illness:      Brandon H.  Harris is an 86 year old gentleman with Coronary artery disease S/p CABG, Cath 9/19: Severe LAD stenosis beyond the LIMA insertion, med therapy for anginal equivalent = shortness of breath, primary hypertension, hyperlipidemia, chronic kidney disease stage IV, carotid artery disease ([place] 09/2018: bilat ICA 1-39; bilat VA 100), hx of CVA and, GERD.  He is doing well.  I have not seen him in several months to a year.  They are concerned about his blood pressure.  His blood pressure is definitely higher than it should be.  He has tried diuretics in the past but will not take them because he already has a urinary retention problem, frequent urination, and diuretics aggravate urinary frequency and incontinence.  Angina has been under great control.  He denies nitroglycerin use.  No orthopnea, PND, or other problems.  Has not had episodes of syncope.  Past Medical History:  Diagnosis Date  . Arthritis   . Carotid arterial disease ([place])   . Chronic kidney disease (CKD), stage IV (severe) ([place])   . Diverticulosis    colonic diverticulosis  . Gallstones   . GERD (gastroesophageal reflux disease)   . Hypertension   . Myocardial infarction ([place])   . Prostate cancer ([place])   . Status post coronary artery bypass grafting     intraoperative cholangiogram  . Stroke [facility])    patient reports no deficits   . Urinary incontinence    bladder leakage - " patient wears a pad"    Past Surgical History:  Procedure Laterality Date  . BACK SURGERY     x 4, has titanium in back per patient   . CHOLECYSTECTOMY, LAPAROSCOPIC    . CORONARY ARTERY BYPASS GRAFT   2001  . CRYOTHERAPY     Prostate Cancer  . LEFT HEART CATH AND CORS/GRAFTS ANGIOGRAPHY N/A 10/27/2017   Procedure: LEFT HEART CATH AND CORS/GRAFTS ANGIOGRAPHY;  Surgeon: Belva Crome, MD;  Location: [place] [facility];  Service: Cardiovascular;  Laterality: N/A;  . prostate cancer    . ROTATOR CUFF REPAIR     LEFT  . TOTAL KNEE ARTHROPLASTY Right 12/18/2014   Procedure: TOTAL RIGHT KNEE ARTHROPLASTY;  Surgeon: Paralee Cancel, MD;  Location: [facility];  Service: Orthopedics;  Laterality: Right;    Current Medications: Current Meds  Medication Sig  . ALPRAZolam (XANAX) 0.25 MG tablet Take 0.25 mg by mouth at bedtime as needed for anxiety or sleep. TAKE ONE TABLET AT NIGHT  . amitriptyline (ELAVIL) 10 MG tablet Take 20 mg by mouth in the morning and at bedtime.   Marland Kitchen aspirin EC 81 MG tablet Take 1 tablet (81 mg total) by mouth daily.  Marland Kitchen atorvastatin (LIPITOR) 80 MG tablet Take 80 mg by mouth every evening.   . carvedilol (COREG) 6.25 MG tablet TAKE ONE TABLET BY MOUTH EVERY MORNING and TAKE ONE TABLET BY MOUTH EVERYDAY AT BEDTIME  . hydrALAZINE (APRESOLINE) 25 MG tablet Take 1 tablet (25 mg total) by mouth 3 (three) times daily. To be taken with (50 mg) tablet three times daily.  . hydrALAZINE (APRESOLINE) 50  MG tablet Take 1 tablet (50 mg total) by mouth 3 (three) times daily.  . isosorbide mononitrate (IMDUR) 60 MG 24 hr tablet TAKE ONE TABLET BY MOUTH EVERY MORNING  . melatonin 5 MG TABS Take 5 mg by mouth at bedtime.  . mirtazapine (REMERON) 15 MG tablet Take 15 mg by mouth at bedtime.  . nitroGLYCERIN (NITROSTAT) 0.4 MG SL tablet Place 1 tablet (0.4 mg total) under the tongue every 5 (five) minutes as needed for chest pain.  . pantoprazole (PROTONIX) 40 MG tablet TAKE ONE TABLET BY MOUTH EVERY MORNING  . tamsulosin (FLOMAX) 0.4 MG CAPS capsule Take 0.8 mg by mouth daily. Take two (2) capsules (0.8 mg) by mouth daily.   Marland Kitchen zolpidem (AMBIEN) 10 MG tablet Take 10 mg by mouth at bedtime.   .  [DISCONTINUED] amLODipine (NORVASC) 5 MG tablet Take 5 mg by mouth every morning.  . [DISCONTINUED] mirtazapine (REMERON) 7.5 MG tablet Take 7.5 mg by mouth at bedtime.      Allergies:   Patient has no known allergies.   Social History   Socioeconomic History  . Marital status: Widowed    Spouse name: Not on file  . Number of children: Not on file  . Years of education: Not on file  . Highest education level: Not on file  Occupational History  . Not on file  Tobacco Use  . Smoking status: Never Smoker  . Smokeless tobacco: Never Used  Vaping Use  . Vaping Use: Never used  Substance and Sexual Activity  . Alcohol use: No  . Drug use: No  . Sexual activity: Not Currently  Other Topics Concern  . Not on file  Social History Narrative  . Not on file   Social Determinants of Health   Financial Resource Strain:   . Difficulty of Paying Living Expenses: Not on file  Food Insecurity:   . Worried About Charity fundraiser in the Last Year: Not on file  . Ran Out of Food in the Last Year: Not on file  Transportation Needs:   . Lack of Transportation (Medical): Not on file  . Lack of Transportation (Non-Medical): Not on file  Physical Activity:   . Days of Exercise per Week: Not on file  . Minutes of Exercise per Session: Not on file  Stress:   . Feeling of Stress : Not on file  Social Connections:   . Frequency of Communication with Friends and Family: Not on file  . Frequency of Social Gatherings with Friends and Family: Not on file  . Attends Religious Services: Not on file  . Active Member of Clubs or Organizations: Not on file  . Attends Archivist Meetings: Not on file  . Marital Status: Not on file     Family History: The patient's family history includes Heart failure in his mother.  ROS:   Please see the history of present illness.    Diet is not heavy in sodium.  He has not had Covid.  He is accompanied by his daughter.  [name] diet..  All  other systems reviewed and are negative.  EKGs/Labs/Other Studies Reviewed:    The following studies were reviewed today: New imaging data  EKG:  EKG not repeated.  He had PACs with poor R wave progression on the prior EKG and May 2021.  Recent Labs: 07/14/2019: BUN 24; Creatinine, Ser 2.05; Potassium 4.7; Sodium 141  Recent Lipid Panel No results found for: CHOL, TRIG, HDL, CHOLHDL, VLDL,  LDLCALC, LDLDIRECT  Physical Exam:    VS:  BP (!) 162/76   Pulse 64   Ht 5\' 11"  (1.803 m)   Wt 201 lb 6.4 oz (91.4 kg)   SpO2 98%   BMI 28.09 kg/m     Wt Readings from Last 3 Encounters:  11/24/19 201 lb 6.4 oz (91.4 kg)  08/02/19 201 lb 12.8 oz (91.5 kg)  08/01/19 201 lb (91.2 kg)     GEN: Elderly but appears intact.. No acute distress HEENT: Normal NECK: No JVD. LYMPHATICS: No lymphadenopathy CARDIAC: Irregular RR without murmur, gallop, or edema. VASCULAR:  Normal Pulses. No bruits. RESPIRATORY:  Clear to auscultation without rales, wheezing or rhonchi  ABDOMEN: Soft, non-tender, non-distended, No pulsatile mass, MUSCULOSKELETAL: No deformity  SKIN: Warm and dry NEUROLOGIC:  Alert and oriented x 3 PSYCHIATRIC:  Normal affect   ASSESSMENT:    1. Coronary artery disease involving native coronary artery of native heart without angina pectoris   2. Bilateral carotid artery disease, unspecified type ([place])   3. Essential hypertension   4. RAS (renal artery stenosis) ([place])   5. Hyperlipidemia LDL goal <70   6. CKD (chronic kidney disease) stage 4, GFR 15-29 ml/min (HCC)   7. Educated about COVID-19 virus infection    PLAN:    In order of problems listed above:  1. Stable angina pectoris.  Continue secondary prevention with blood pressure control, lipid control, aspirin therapy, and anti-ischemic therapy including Imdur and carvedilol. 2. Not discussed.  Continue secondary prevention. 3. Increase Norvasc to 10 mg/day.  He has been unable to tolerate diuretics.  We may still have  to come back to diuretic therapy, perhaps 2 or 3 times per week.  Continue carvedilol 6.25 mg twice daily, Apresoline 75 mg 3 times daily, and Imdur. 4. Known renal artery stenosis however treating blood pressure medically with reasonable success. 5. Continue atorvastatin 80 mg daily for target LDL less than 70.  Most recent is 96. 6. Stage IV CKD.  Some concern about using diuretic therapy. 7. Vaccinated and safe.  Did not get COVID-19 infection.  Target blood pressure is 140/80 mmHg.  Amlodipine is being increased to 10 mg/day.  Watch out for swelling.  If not successful at lowering systolic to target, may need to add low-dose diuretics therapy 2 or 3 days/week.   Medication Adjustments/Labs and Tests Ordered: Current medicines are reviewed at length with the patient today.  Concerns regarding medicines are outlined above.  No orders of the defined types were placed in this encounter.  Meds ordered this encounter  Medications  . amLODipine (NORVASC) 10 MG tablet    Sig: Take 1 tablet (10 mg total) by mouth daily.    Dispense:  90 tablet    Refill:  3    Dose change    There are no Patient Instructions on file for this visit.   Signed, Sinclair Grooms, MD  11/24/2019 1:53 PM    [facility]

## 2019-11-24 ENCOUNTER — Other Ambulatory Visit: Payer: Self-pay

## 2019-11-24 ENCOUNTER — Encounter: Payer: Self-pay | Admitting: Interventional Cardiology

## 2019-11-24 ENCOUNTER — Ambulatory Visit: Payer: Medicare Other | Admitting: Interventional Cardiology

## 2019-11-24 VITALS — BP 162/76 | HR 64 | Ht 71.0 in | Wt 201.4 lb

## 2019-11-24 DIAGNOSIS — I701 Atherosclerosis of renal artery: Secondary | ICD-10-CM

## 2019-11-24 DIAGNOSIS — E785 Hyperlipidemia, unspecified: Secondary | ICD-10-CM

## 2019-11-24 DIAGNOSIS — I779 Disorder of arteries and arterioles, unspecified: Secondary | ICD-10-CM

## 2019-11-24 DIAGNOSIS — I2510 Atherosclerotic heart disease of native coronary artery without angina pectoris: Secondary | ICD-10-CM

## 2019-11-24 DIAGNOSIS — Z7189 Other specified counseling: Secondary | ICD-10-CM

## 2019-11-24 DIAGNOSIS — I10 Essential (primary) hypertension: Secondary | ICD-10-CM

## 2019-11-24 DIAGNOSIS — N184 Chronic kidney disease, stage 4 (severe): Secondary | ICD-10-CM

## 2019-11-24 MED ORDER — AMLODIPINE BESYLATE 10 MG PO TABS: 10.0000 mg | ORAL_TABLET | Freq: Every day | ORAL | 3 refills | Status: DC

## 2019-11-24 NOTE — Patient Instructions (Signed)
Medication Instructions:  1) INCREASE Amlodipine to 10mg once daily.  *If you need a refill on your cardiac medications before your next appointment, please call your pharmacy*   Lab Work: None If you have labs (blood work) drawn today and your tests are completely normal, you will receive your results only by: . MyChart Message (if you have MyChart) OR . A paper copy in the mail If you have any lab test that is abnormal or we need to change your treatment, we will call you to review the results.   Testing/Procedures: None   Follow-Up: At [facility], you and your health needs are our priority.  As part of our continuing mission to provide you with exceptional heart care, we have created designated Provider Care Teams.  These Care Teams include your primary Cardiologist (physician) and Advanced Practice Providers (APPs -  Physician Assistants and Nurse Practitioners) who all work together to provide you with the care you need, when you need it.  We recommend signing up for the patient portal called "MyChart".  Sign up information is provided on this After Visit Summary.  MyChart is used to connect with patients for Virtual Visits (Telemedicine).  Patients are able to view lab/test results, encounter notes, upcoming appointments, etc.  Non-urgent messages can be sent to your provider as well.   To learn more about what you can do with MyChart, go to [email or web address].    Your next appointment:   6 month(s)  The format for your next appointment:   In Person  Provider:   You may see Henry W Smith III, MD or one of the following Advanced Practice Providers on your designated Care Team:    Lori Gerhardt, NP  Laura Ingold, NP  Jill McDaniel, NP    Other Instructions   

## 2019-11-28 ENCOUNTER — Telehealth: Payer: Self-pay | Admitting: Interventional Cardiology

## 2019-11-28 NOTE — Telephone Encounter (Signed)
Pt c/o medication issue:  1. Name of Medication: amLODipine (NORVASC) 10 MG tablet [[id number]]   2. How are you currently taking this medication (dosage and times per day)? Take 1 tablet (10 mg total) by mouth daily.  3. Are you having a reaction (difficulty breathing--STAT)? No    4. What is your medication issue? [facility] called in and stated that pt in the past swells on the 10mg  Amlodipine but he does ok on the 5mg .  They want to make sure that Dr Tamala Julian wants to go ahead with the 10mg s?    Best number [phone]

## 2019-11-28 NOTE — Telephone Encounter (Signed)
Spoke with Estill Bamberg with [facility] who states pt was noted in the past to have increased swelling on Amlodipine 10mg  but has tolerated 5mg  without problems.  Advised Dr Thompson Caul last note states to increase Norvasc to 10mg  and advised Dr Tamala Julian is out of office this week as well as his nurse.  Estill Bamberg requests that note be sent to Dr Tamala Julian for review when he returns and she will forward to her clinical pharmacist as well.

## 2019-11-28 NOTE — Telephone Encounter (Signed)
Amlodipine 10 mg daily. Limited options.Marland KitchenMarland Kitchen

## 2019-11-29 NOTE — Telephone Encounter (Signed)
Called and spoke to pt's dtr.  Informed of pharmacy call and report of swelling on Amlodipine 10 mg -- dtr states that pt has not had issues on increased dose, denies he has experienced swelling. dtr reports that he should start the increased dose next week as he gets his pills together in pack form.  She stated that Dr. Tamala Julian is aware of this and told them just to start the increase when new weekly medication packs are delivered next week. Spoke to [place] at [employer] and informed of statement.  Informed to fill Amlodipine 10 mg per Dr. Tamala Julian.

## 2019-11-30 ENCOUNTER — Other Ambulatory Visit: Payer: Self-pay

## 2019-11-30 MED ORDER — HYDRALAZINE HCL 25 MG PO TABS
25.0000 mg | ORAL_TABLET | Freq: Three times a day (TID) | ORAL | 3 refills | Status: DC
Start: 2019-11-30 — End: 2020-03-28

## 2020-02-29 ENCOUNTER — Ambulatory Visit (HOSPITAL_COMMUNITY)
Admission: EM | Admit: 2020-02-29 | Discharge: 2020-02-29 | Disposition: A | Discharge status: Home / Self Care | Payer: Medicare Other

## 2020-03-08 DIAGNOSIS — K219 Gastro-esophageal reflux disease without esophagitis: Secondary | ICD-10-CM | POA: Diagnosis not present

## 2020-03-08 DIAGNOSIS — E782 Mixed hyperlipidemia: Secondary | ICD-10-CM | POA: Diagnosis not present

## 2020-03-08 DIAGNOSIS — I129 Hypertensive chronic kidney disease with stage 1 through stage 4 chronic kidney disease, or unspecified chronic kidney disease: Secondary | ICD-10-CM | POA: Diagnosis not present

## 2020-03-08 DIAGNOSIS — I2510 Atherosclerotic heart disease of native coronary artery without angina pectoris: Secondary | ICD-10-CM | POA: Diagnosis not present

## 2020-03-08 DIAGNOSIS — G4700 Insomnia, unspecified: Secondary | ICD-10-CM | POA: Diagnosis not present

## 2020-03-12 ENCOUNTER — Other Ambulatory Visit: Payer: Self-pay | Admitting: Family Medicine

## 2020-03-12 ENCOUNTER — Other Ambulatory Visit: Payer: Self-pay

## 2020-03-12 ENCOUNTER — Ambulatory Visit
Admission: RE | Admit: 2020-03-12 | Discharge: 2020-03-12 | Disposition: A | Discharge status: Home / Self Care | Payer: Medicare Other | Source: Ambulatory Visit | Attending: Family Medicine | Admitting: Family Medicine

## 2020-03-12 DIAGNOSIS — Z23 Encounter for immunization: Secondary | ICD-10-CM | POA: Diagnosis not present

## 2020-03-12 DIAGNOSIS — I517 Cardiomegaly: Secondary | ICD-10-CM | POA: Diagnosis not present

## 2020-03-12 DIAGNOSIS — R062 Wheezing: Secondary | ICD-10-CM | POA: Diagnosis not present

## 2020-03-12 DIAGNOSIS — I129 Hypertensive chronic kidney disease with stage 1 through stage 4 chronic kidney disease, or unspecified chronic kidney disease: Secondary | ICD-10-CM | POA: Diagnosis not present

## 2020-03-12 DIAGNOSIS — J9811 Atelectasis: Secondary | ICD-10-CM | POA: Diagnosis not present

## 2020-03-12 DIAGNOSIS — I252 Old myocardial infarction: Secondary | ICD-10-CM | POA: Diagnosis not present

## 2020-03-12 DIAGNOSIS — M4186 Other forms of scoliosis, lumbar region: Secondary | ICD-10-CM | POA: Diagnosis not present

## 2020-03-28 ENCOUNTER — Other Ambulatory Visit: Payer: Self-pay | Admitting: Interventional Cardiology

## 2020-03-28 MED ORDER — HYDRALAZINE HCL 50 MG PO TABS
50.0000 mg | ORAL_TABLET | Freq: Three times a day (TID) | ORAL | 2 refills | Status: DC
Start: 2020-03-28 — End: 2021-01-07

## 2020-03-28 MED ORDER — HYDRALAZINE HCL 25 MG PO TABS
25.0000 mg | ORAL_TABLET | Freq: Three times a day (TID) | ORAL | 2 refills | Status: DC
Start: 2020-03-28 — End: 2021-01-07

## 2020-04-05 DIAGNOSIS — N184 Chronic kidney disease, stage 4 (severe): Secondary | ICD-10-CM | POA: Diagnosis not present

## 2020-04-05 DIAGNOSIS — E782 Mixed hyperlipidemia: Secondary | ICD-10-CM | POA: Diagnosis not present

## 2020-04-05 DIAGNOSIS — G4700 Insomnia, unspecified: Secondary | ICD-10-CM | POA: Diagnosis not present

## 2020-04-05 DIAGNOSIS — K219 Gastro-esophageal reflux disease without esophagitis: Secondary | ICD-10-CM | POA: Diagnosis not present

## 2020-04-05 DIAGNOSIS — I129 Hypertensive chronic kidney disease with stage 1 through stage 4 chronic kidney disease, or unspecified chronic kidney disease: Secondary | ICD-10-CM | POA: Diagnosis not present

## 2020-04-05 DIAGNOSIS — I2510 Atherosclerotic heart disease of native coronary artery without angina pectoris: Secondary | ICD-10-CM | POA: Diagnosis not present

## 2020-05-01 DIAGNOSIS — N183 Chronic kidney disease, stage 3 unspecified: Secondary | ICD-10-CM | POA: Diagnosis not present

## 2020-05-01 DIAGNOSIS — G4700 Insomnia, unspecified: Secondary | ICD-10-CM | POA: Diagnosis not present

## 2020-05-01 DIAGNOSIS — K219 Gastro-esophageal reflux disease without esophagitis: Secondary | ICD-10-CM | POA: Diagnosis not present

## 2020-05-01 DIAGNOSIS — N184 Chronic kidney disease, stage 4 (severe): Secondary | ICD-10-CM | POA: Diagnosis not present

## 2020-05-01 DIAGNOSIS — E782 Mixed hyperlipidemia: Secondary | ICD-10-CM | POA: Diagnosis not present

## 2020-05-01 DIAGNOSIS — I2510 Atherosclerotic heart disease of native coronary artery without angina pectoris: Secondary | ICD-10-CM | POA: Diagnosis not present

## 2020-05-01 DIAGNOSIS — I129 Hypertensive chronic kidney disease with stage 1 through stage 4 chronic kidney disease, or unspecified chronic kidney disease: Secondary | ICD-10-CM | POA: Diagnosis not present

## 2020-05-27 DIAGNOSIS — N3281 Overactive bladder: Secondary | ICD-10-CM | POA: Diagnosis not present

## 2020-06-06 DIAGNOSIS — M1712 Unilateral primary osteoarthritis, left knee: Secondary | ICD-10-CM | POA: Diagnosis not present

## 2020-06-06 DIAGNOSIS — M25562 Pain in left knee: Secondary | ICD-10-CM | POA: Insufficient documentation

## 2020-07-23 DIAGNOSIS — M170 Bilateral primary osteoarthritis of knee: Secondary | ICD-10-CM | POA: Diagnosis not present

## 2020-07-24 ENCOUNTER — Other Ambulatory Visit: Payer: Self-pay | Admitting: Interventional Cardiology

## 2020-07-30 DIAGNOSIS — N184 Chronic kidney disease, stage 4 (severe): Secondary | ICD-10-CM | POA: Diagnosis not present

## 2020-07-30 DIAGNOSIS — I2510 Atherosclerotic heart disease of native coronary artery without angina pectoris: Secondary | ICD-10-CM | POA: Diagnosis not present

## 2020-07-30 DIAGNOSIS — E782 Mixed hyperlipidemia: Secondary | ICD-10-CM | POA: Diagnosis not present

## 2020-07-30 DIAGNOSIS — K219 Gastro-esophageal reflux disease without esophagitis: Secondary | ICD-10-CM | POA: Diagnosis not present

## 2020-07-30 DIAGNOSIS — G4700 Insomnia, unspecified: Secondary | ICD-10-CM | POA: Diagnosis not present

## 2020-08-01 DIAGNOSIS — D692 Other nonthrombocytopenic purpura: Secondary | ICD-10-CM | POA: Diagnosis not present

## 2020-08-01 DIAGNOSIS — E782 Mixed hyperlipidemia: Secondary | ICD-10-CM | POA: Diagnosis not present

## 2020-08-01 DIAGNOSIS — N184 Chronic kidney disease, stage 4 (severe): Secondary | ICD-10-CM | POA: Diagnosis not present

## 2020-08-01 DIAGNOSIS — K219 Gastro-esophageal reflux disease without esophagitis: Secondary | ICD-10-CM | POA: Diagnosis not present

## 2020-08-01 DIAGNOSIS — G4700 Insomnia, unspecified: Secondary | ICD-10-CM | POA: Diagnosis not present

## 2020-08-01 DIAGNOSIS — I25111 Atherosclerotic heart disease of native coronary artery with angina pectoris with documented spasm: Secondary | ICD-10-CM | POA: Diagnosis not present

## 2020-08-01 DIAGNOSIS — I129 Hypertensive chronic kidney disease with stage 1 through stage 4 chronic kidney disease, or unspecified chronic kidney disease: Secondary | ICD-10-CM | POA: Diagnosis not present

## 2020-08-01 DIAGNOSIS — N3281 Overactive bladder: Secondary | ICD-10-CM | POA: Diagnosis not present

## 2020-08-08 ENCOUNTER — Encounter: Payer: Self-pay | Admitting: Interventional Cardiology

## 2020-08-08 ENCOUNTER — Other Ambulatory Visit: Payer: Self-pay

## 2020-08-08 ENCOUNTER — Ambulatory Visit (INDEPENDENT_AMBULATORY_CARE_PROVIDER_SITE_OTHER): Payer: Medicare Other | Admitting: Interventional Cardiology

## 2020-08-08 VITALS — BP 168/82 | HR 64 | Ht 71.0 in | Wt 203.2 lb

## 2020-08-08 DIAGNOSIS — I701 Atherosclerosis of renal artery: Secondary | ICD-10-CM | POA: Diagnosis not present

## 2020-08-08 DIAGNOSIS — E785 Hyperlipidemia, unspecified: Secondary | ICD-10-CM

## 2020-08-08 DIAGNOSIS — I779 Disorder of arteries and arterioles, unspecified: Secondary | ICD-10-CM

## 2020-08-08 DIAGNOSIS — I25708 Atherosclerosis of coronary artery bypass graft(s), unspecified, with other forms of angina pectoris: Secondary | ICD-10-CM

## 2020-08-08 DIAGNOSIS — I10 Essential (primary) hypertension: Secondary | ICD-10-CM

## 2020-08-08 DIAGNOSIS — N184 Chronic kidney disease, stage 4 (severe): Secondary | ICD-10-CM

## 2020-08-08 NOTE — Progress Notes (Signed)
Cardiology Office Note:    Date:  08/08/2020   ID:  ACEL NATZKE, DOB Dec 28, 1928, MRN [id number]  PCP:  Antony Contras, MD  Cardiologist:  Sinclair Grooms, MD   Referring MD: Antony Contras, MD   Chief Complaint  Patient presents with   Coronary Artery Disease   Congestive Heart Failure    History of Present Illness:    Brandon Harris is a 87 y.o. male with a hx of Coronary artery disease S/p CABG, Cath 9/19: Severe LAD stenosis beyond the LIMA insertion, med therapy for anginal equivalent = shortness of breath, primary hypertension, hyperlipidemia, chronic kidney disease stage IV, carotid artery disease ([place] 09/2018: bilat ICA 1-39; bilat VA 100), hx of CVA and, GERD.  Overall doing well.  Minimal angina.  Nitroglycerin is not being used.  Denies orthopnea and PND.  Has not had syncope.  Physically active.  Past Medical History:  Diagnosis Date   Arthritis    Carotid arterial disease ([place])    Chronic kidney disease (CKD), stage IV (severe) (HCC)    Diverticulosis    colonic diverticulosis   Gallstones    GERD (gastroesophageal reflux disease)    Hypertension    Myocardial infarction [facility])    Prostate cancer ([place])    Status post coronary artery bypass grafting     intraoperative cholangiogram   Stroke [facility])    patient reports no deficits    Urinary incontinence    bladder leakage - " patient wears a pad"    Past Surgical History:  Procedure Laterality Date   BACK SURGERY     x 4, has titanium in back per patient    CHOLECYSTECTOMY, LAPAROSCOPIC     CORONARY ARTERY BYPASS GRAFT  2001   CRYOTHERAPY     Prostate Cancer   LEFT HEART CATH AND CORS/GRAFTS ANGIOGRAPHY N/A 10/27/2017   Procedure: LEFT HEART CATH AND CORS/GRAFTS ANGIOGRAPHY;  Surgeon: Belva Crome, MD;  Location: [facility];  Service: Cardiovascular;  Laterality: N/A;   prostate cancer     ROTATOR CUFF REPAIR     LEFT   TOTAL KNEE ARTHROPLASTY Right 12/18/2014   Procedure: TOTAL RIGHT KNEE  ARTHROPLASTY;  Surgeon: Paralee Cancel, MD;  Location: [facility];  Service: Orthopedics;  Laterality: Right;    Current Medications: Current Meds  Medication Sig   ALPRAZolam (XANAX) 0.25 MG tablet Take 0.25 mg by mouth at bedtime as needed for anxiety or sleep. TAKE ONE TABLET AT NIGHT   amitriptyline (ELAVIL) 10 MG tablet Take 20 mg by mouth in the morning and at bedtime.    amLODipine (NORVASC) 10 MG tablet Take 1 tablet (10 mg total) by mouth daily.   aspirin EC 81 MG tablet Take 1 tablet (81 mg total) by mouth daily.   atorvastatin (LIPITOR) 80 MG tablet Take 80 mg by mouth every evening.    carvedilol (COREG) 6.25 MG tablet Take 1 tablet (6.25 mg total) by mouth 2 (two) times daily with a meal. Pt needs to keep upcoming appt in June for further refills   clopidogrel (PLAVIX) 75 MG tablet Take 75 mg by mouth every morning.   hydrALAZINE (APRESOLINE) 25 MG tablet Take 1 tablet (25 mg total) by mouth 3 (three) times daily. To be taken with (50 mg) tablet three times daily.   hydrALAZINE (APRESOLINE) 50 MG tablet Take 1 tablet (50 mg total) by mouth 3 (three) times daily.   isosorbide mononitrate (IMDUR) 60 MG 24 hr tablet Take 1  tablet (60 mg total) by mouth every morning. Pt needs to keep upcoming appt in June for further refills   Melatonin 10 MG TABS Take 10 mg by mouth daily.   mirtazapine (REMERON) 15 MG tablet Take 15 mg by mouth at bedtime.   nitroGLYCERIN (NITROSTAT) 0.4 MG SL tablet Place 1 tablet (0.4 mg total) under the tongue every 5 (five) minutes as needed for chest pain.   pantoprazole (PROTONIX) 40 MG tablet TAKE ONE TABLET BY MOUTH EVERY MORNING   tamsulosin (FLOMAX) 0.4 MG CAPS capsule Take 0.8 mg by mouth daily. Take two (2) capsules (0.8 mg) by mouth daily.    traMADol (ULTRAM) 50 MG tablet Take 50 mg by mouth every 6 (six) hours.   traMADol (ULTRAM) 50 MG tablet Take by mouth every 6 (six) hours as needed.   zolpidem (AMBIEN) 10 MG tablet Take 10 mg by mouth at bedtime.       Allergies:   Patient has no known allergies.   Social History   Socioeconomic History   Marital status: Widowed    Spouse name: Not on file   Number of children: Not on file   Years of education: Not on file   Highest education level: Not on file  Occupational History   Not on file  Tobacco Use   Smoking status: Never   Smokeless tobacco: Never  Vaping Use   Vaping Use: Never used  Substance and Sexual Activity   Alcohol use: No   Drug use: No   Sexual activity: Not Currently  Other Topics Concern   Not on file  Social History Narrative   Not on file   Social Determinants of Health   Financial Resource Strain: Not on file  Food Insecurity: Not on file  Transportation Needs: Not on file  Physical Activity: Not on file  Stress: Not on file  Social Connections: Not on file     Family History: The patient's family history includes Heart failure in his mother.  ROS:   Please see the history of present illness.    He is not having orthopnea.  He is not able to be as active as he would like because of knee arthritis.  He uses a cane to ambulate.  All other systems reviewed and are negative.  EKGs/Labs/Other Studies Reviewed:    The following studies were reviewed today:  Carotid Doppler 2021: Summary:  Right Carotid: Velocities in the right ICA are consistent with a 1-39%  stenosis.                 Non-hemodynamically significant plaque <50% noted in the  CCA.                    There is significant amount of plaque noted in the proximal                 segment and acoustic shadowing that makes it difficult to                 determine a more significant stenosis.   Left Carotid: Velocities in the left ICA are consistent with a 1-39%  stenosis.                Non-hemodynamically significant plaque <50% noted in the  CCA. The                ECA appears >50% stenosed.  There is significant amount of plaque noted in the proximal                 segment and acoustic shadowing that makes it difficult to                determine a more significant stenosis.   Vertebrals:  Right vertebral artery demonstrates high resistant flow. Left               vertebral artery demonstrates an occlusion.  Subclavians: Left subclavian artery was stenotic. Left subclavian artery  flow               was disturbed.   EKG:  EKG sinus rhythm at 64 bpm.  Left axis deviation.  T wave flattening.  Recent Labs: No results found for requested labs within last 8760 hours.  Recent Lipid Panel No results found for: CHOL, TRIG, HDL, CHOLHDL, VLDL, LDLCALC, LDLDIRECT  Physical Exam:    VS:  BP (!) 168/82   Pulse 64   Ht 5\' 11"  (1.803 m)   Wt 203 lb 3.2 oz (92.2 kg)   SpO2 97%   BMI 28.34 kg/m     Wt Readings from Last 3 Encounters:  08/08/20 203 lb 3.2 oz (92.2 kg)  11/24/19 201 lb 6.4 oz (91.4 kg)  08/02/19 201 lb 12.8 oz (91.5 kg)     GEN: Overweight. No acute distress HEENT: Normal NECK: No JVD.  Loud left carotid bruit LYMPHATICS: No lymphadenopathy CARDIAC: No murmur. RRR no gallop, or edema. VASCULAR:  Normal Pulses. No bruits. RESPIRATORY:  Clear to auscultation without rales, wheezing or rhonchi  ABDOMEN: Soft, non-tender, non-distended, No pulsatile mass, MUSCULOSKELETAL: No deformity  SKIN: Warm and dry NEUROLOGIC:  Alert and oriented x 3 PSYCHIATRIC:  Normal affect   ASSESSMENT:    1. Bilateral carotid artery disease, unspecified type ([place])   2. Essential hypertension   3. RAS (renal artery stenosis) ([place])   4. Hyperlipidemia LDL goal <70   5. CKD (chronic kidney disease) stage 4, GFR 15-29 ml/min (HCC)   6. Coronary artery disease of bypass graft of native heart with stable angina pectoris ([place])    PLAN:    In order of problems listed above:  Recheck carotid Doppler in August. Increase carvedilol to 12.5 mg twice daily to better improve systolic blood pressure. No specific action item at this time Continue Lipitor  80 mg/day  Creatinine is 2.23.  This is impacting his blood pressure is well related to volume retention. Increasing carvedilol dose will help to further improve angina control and blood pressure.  Plavix will be discontinued after he completes his current supply.   Overall education and awareness concerning primary/secondary risk prevention was discussed in detail: LDL less than 70, hemoglobin A1c less than 7, blood pressure target less than 130/80 mmHg, >150 minutes of moderate aerobic activity per week, avoidance of smoking, weight control (via diet and exercise), and continued surveillance/management of/for obstructive sleep apnea.     Medication Adjustments/Labs and Tests Ordered: Current medicines are reviewed at length with the patient today.  Concerns regarding medicines are outlined above.  Orders Placed This Encounter  Procedures   EKG 12-Lead   No orders of the defined types were placed in this encounter.   Patient Instructions  Medication Instructions:  Your physician recommends that you continue on your current medications as directed. Please refer to the Current Medication list given to you today.  *If you need a refill on your cardiac  medications before your next appointment, please call your pharmacy*   Lab Work: None If you have labs (blood work) drawn today and your tests are completely normal, you will receive your results only by: [place] (if you have MyChart) OR A paper copy in the mail If you have any lab test that is abnormal or we need to change your treatment, we will call you to review the results.   Testing/Procedures: Your physician has requested that you have a carotid duplex in August. This test is an ultrasound of the carotid arteries in your neck. It looks at blood flow through these arteries that supply the brain with blood. Allow one hour for this exam. There are no restrictions or special instructions   Follow-Up: At [facility], you  and your health needs are our priority.  As part of our continuing mission to provide you with exceptional heart care, we have created designated Provider Care Teams.  These Care Teams include your primary Cardiologist (physician) and Advanced Practice Providers (APPs -  Physician Assistants and Nurse Practitioners) who all work together to provide you with the care you need, when you need it.  We recommend signing up for the patient portal called "MyChart".  Sign up information is provided on this After Visit Summary.  MyChart is used to connect with patients for Virtual Visits (Telemedicine).  Patients are able to view lab/test results, encounter notes, upcoming appointments, etc.  Non-urgent messages can be sent to your provider as well.   To learn more about what you can do with MyChart, go to [email or web address].    Your next appointment:   1 year(s)  The format for your next appointment:   In Person  Provider:   You may see Sinclair Grooms, MD or one of the following Advanced Practice Providers on your designated Care Team:   Kathyrn Drown, NP   Other Instructions     Signed, Sinclair Grooms, MD  08/08/2020 5:09 PM    [place]

## 2020-08-08 NOTE — Patient Instructions (Signed)
Medication Instructions:  Your physician recommends that you continue on your current medications as directed. Please refer to the Current Medication list given to you today.  *If you need a refill on your cardiac medications before your next appointment, please call your pharmacy*   Lab Work: None If you have labs (blood work) drawn today and your tests are completely normal, you will receive your results only by: [place] (if you have MyChart) OR A paper copy in the mail If you have any lab test that is abnormal or we need to change your treatment, we will call you to review the results.   Testing/Procedures: Your physician has requested that you have a carotid duplex in August. This test is an ultrasound of the carotid arteries in your neck. It looks at blood flow through these arteries that supply the brain with blood. Allow one hour for this exam. There are no restrictions or special instructions   Follow-Up: At [facility], you and your health needs are our priority.  As part of our continuing mission to provide you with exceptional heart care, we have created designated Provider Care Teams.  These Care Teams include your primary Cardiologist (physician) and Advanced Practice Providers (APPs -  Physician Assistants and Nurse Practitioners) who all work together to provide you with the care you need, when you need it.  We recommend signing up for the patient portal called "MyChart".  Sign up information is provided on this After Visit Summary.  MyChart is used to connect with patients for Virtual Visits (Telemedicine).  Patients are able to view lab/test results, encounter notes, upcoming appointments, etc.  Non-urgent messages can be sent to your provider as well.   To learn more about what you can do with MyChart, go to [email or web address].    Your next appointment:   1 year(s)  The format for your next appointment:   In Person  Provider:   You may see Sinclair Grooms, MD or one of the following Advanced Practice Providers on your designated Care Team:   Kathyrn Drown, NP   Other Instructions

## 2020-08-14 DIAGNOSIS — M1712 Unilateral primary osteoarthritis, left knee: Secondary | ICD-10-CM | POA: Diagnosis not present

## 2020-08-21 ENCOUNTER — Other Ambulatory Visit: Payer: Self-pay | Admitting: Interventional Cardiology

## 2020-08-21 DIAGNOSIS — M1712 Unilateral primary osteoarthritis, left knee: Secondary | ICD-10-CM | POA: Diagnosis not present

## 2020-08-28 DIAGNOSIS — M1712 Unilateral primary osteoarthritis, left knee: Secondary | ICD-10-CM | POA: Diagnosis not present

## 2020-09-03 DIAGNOSIS — I129 Hypertensive chronic kidney disease with stage 1 through stage 4 chronic kidney disease, or unspecified chronic kidney disease: Secondary | ICD-10-CM | POA: Diagnosis not present

## 2020-09-03 DIAGNOSIS — N184 Chronic kidney disease, stage 4 (severe): Secondary | ICD-10-CM | POA: Diagnosis not present

## 2020-09-03 DIAGNOSIS — K219 Gastro-esophageal reflux disease without esophagitis: Secondary | ICD-10-CM | POA: Diagnosis not present

## 2020-09-03 DIAGNOSIS — G4700 Insomnia, unspecified: Secondary | ICD-10-CM | POA: Diagnosis not present

## 2020-09-03 DIAGNOSIS — E782 Mixed hyperlipidemia: Secondary | ICD-10-CM | POA: Diagnosis not present

## 2020-09-03 DIAGNOSIS — I2510 Atherosclerotic heart disease of native coronary artery without angina pectoris: Secondary | ICD-10-CM | POA: Diagnosis not present

## 2020-09-04 DIAGNOSIS — H35371 Puckering of macula, right eye: Secondary | ICD-10-CM | POA: Diagnosis not present

## 2020-09-04 DIAGNOSIS — H524 Presbyopia: Secondary | ICD-10-CM | POA: Diagnosis not present

## 2020-09-04 DIAGNOSIS — H5000 Unspecified esotropia: Secondary | ICD-10-CM | POA: Diagnosis not present

## 2020-09-04 DIAGNOSIS — Z961 Presence of intraocular lens: Secondary | ICD-10-CM | POA: Diagnosis not present

## 2020-09-12 ENCOUNTER — Other Ambulatory Visit: Payer: Self-pay

## 2020-09-12 ENCOUNTER — Ambulatory Visit (HOSPITAL_COMMUNITY)
Admission: RE | Admit: 2020-09-12 | Discharge: 2020-09-12 | Disposition: A | Discharge status: Home / Self Care | Payer: Medicare Other | Source: Ambulatory Visit | Attending: Cardiology | Admitting: Cardiology

## 2020-09-12 DIAGNOSIS — I6523 Occlusion and stenosis of bilateral carotid arteries: Secondary | ICD-10-CM | POA: Diagnosis not present

## 2020-09-29 ENCOUNTER — Other Ambulatory Visit: Payer: Self-pay | Admitting: Interventional Cardiology

## 2020-09-30 DIAGNOSIS — E782 Mixed hyperlipidemia: Secondary | ICD-10-CM | POA: Diagnosis not present

## 2020-09-30 DIAGNOSIS — I2510 Atherosclerotic heart disease of native coronary artery without angina pectoris: Secondary | ICD-10-CM | POA: Diagnosis not present

## 2020-09-30 DIAGNOSIS — N184 Chronic kidney disease, stage 4 (severe): Secondary | ICD-10-CM | POA: Diagnosis not present

## 2020-09-30 DIAGNOSIS — I129 Hypertensive chronic kidney disease with stage 1 through stage 4 chronic kidney disease, or unspecified chronic kidney disease: Secondary | ICD-10-CM | POA: Diagnosis not present

## 2020-09-30 DIAGNOSIS — G4700 Insomnia, unspecified: Secondary | ICD-10-CM | POA: Diagnosis not present

## 2020-09-30 DIAGNOSIS — K219 Gastro-esophageal reflux disease without esophagitis: Secondary | ICD-10-CM | POA: Diagnosis not present

## 2020-09-30 NOTE — Telephone Encounter (Signed)
Left message for pt to call back.  Need to clarify what does of Coreg he has been taking and obtain some BP readings on pt.

## 2020-09-30 NOTE — Telephone Encounter (Signed)
Note from 08/08/20 office visit with Dr Tamala Julian states to increase carvedilol to 12.5mg  BID to better improve systolic BP. Just wanted to clarify as it was not changed and pharmacy is requesting the 6.25 mg BID.  Please advise. Thanks, MI

## 2020-10-01 NOTE — Telephone Encounter (Signed)
Spoke with daughter and she states pt has been taking Coreg 6.25mg  BID.  Gets meds from [facility] and the meds are pre packaged so he never increased the dose as previously prescribed.  BPs remain elevated 140s/mid-upper 70s.  Advised I will send in the 12.5mg  BID dose and continue to monitor BP.  Daughter in agreement with plan.

## 2020-10-24 DIAGNOSIS — M25511 Pain in right shoulder: Secondary | ICD-10-CM | POA: Insufficient documentation

## 2020-10-25 DIAGNOSIS — M25511 Pain in right shoulder: Secondary | ICD-10-CM | POA: Diagnosis not present

## 2020-10-28 DIAGNOSIS — M25511 Pain in right shoulder: Secondary | ICD-10-CM | POA: Diagnosis not present

## 2020-10-29 DIAGNOSIS — M25511 Pain in right shoulder: Secondary | ICD-10-CM | POA: Diagnosis not present

## 2020-10-30 DIAGNOSIS — E782 Mixed hyperlipidemia: Secondary | ICD-10-CM | POA: Diagnosis not present

## 2020-10-30 DIAGNOSIS — N184 Chronic kidney disease, stage 4 (severe): Secondary | ICD-10-CM | POA: Diagnosis not present

## 2020-10-30 DIAGNOSIS — K219 Gastro-esophageal reflux disease without esophagitis: Secondary | ICD-10-CM | POA: Diagnosis not present

## 2020-10-30 DIAGNOSIS — G4700 Insomnia, unspecified: Secondary | ICD-10-CM | POA: Diagnosis not present

## 2020-10-30 DIAGNOSIS — I129 Hypertensive chronic kidney disease with stage 1 through stage 4 chronic kidney disease, or unspecified chronic kidney disease: Secondary | ICD-10-CM | POA: Diagnosis not present

## 2020-10-30 DIAGNOSIS — I2510 Atherosclerotic heart disease of native coronary artery without angina pectoris: Secondary | ICD-10-CM | POA: Diagnosis not present

## 2020-11-05 DIAGNOSIS — M25511 Pain in right shoulder: Secondary | ICD-10-CM | POA: Diagnosis not present

## 2020-11-07 ENCOUNTER — Other Ambulatory Visit: Payer: Self-pay | Admitting: Physician Assistant

## 2020-11-07 DIAGNOSIS — R1319 Other dysphagia: Secondary | ICD-10-CM

## 2020-11-07 DIAGNOSIS — K219 Gastro-esophageal reflux disease without esophagitis: Secondary | ICD-10-CM | POA: Diagnosis not present

## 2020-11-07 DIAGNOSIS — R1310 Dysphagia, unspecified: Secondary | ICD-10-CM | POA: Diagnosis not present

## 2020-11-08 ENCOUNTER — Other Ambulatory Visit: Payer: Self-pay | Admitting: Physician Assistant

## 2020-11-08 ENCOUNTER — Ambulatory Visit
Admission: RE | Admit: 2020-11-08 | Discharge: 2020-11-08 | Disposition: A | Discharge status: Home / Self Care | Payer: Medicare Other | Source: Ambulatory Visit | Attending: Physician Assistant | Admitting: Physician Assistant

## 2020-11-08 DIAGNOSIS — R1319 Other dysphagia: Secondary | ICD-10-CM

## 2020-11-08 DIAGNOSIS — R1310 Dysphagia, unspecified: Secondary | ICD-10-CM | POA: Diagnosis not present

## 2020-11-20 ENCOUNTER — Other Ambulatory Visit: Payer: Self-pay | Admitting: Interventional Cardiology

## 2020-11-21 DIAGNOSIS — Z23 Encounter for immunization: Secondary | ICD-10-CM | POA: Diagnosis not present

## 2020-11-25 DIAGNOSIS — N3281 Overactive bladder: Secondary | ICD-10-CM | POA: Diagnosis not present

## 2020-11-26 DIAGNOSIS — G4700 Insomnia, unspecified: Secondary | ICD-10-CM | POA: Diagnosis not present

## 2020-11-26 DIAGNOSIS — I129 Hypertensive chronic kidney disease with stage 1 through stage 4 chronic kidney disease, or unspecified chronic kidney disease: Secondary | ICD-10-CM | POA: Diagnosis not present

## 2020-11-26 DIAGNOSIS — N184 Chronic kidney disease, stage 4 (severe): Secondary | ICD-10-CM | POA: Diagnosis not present

## 2020-11-26 DIAGNOSIS — I2510 Atherosclerotic heart disease of native coronary artery without angina pectoris: Secondary | ICD-10-CM | POA: Diagnosis not present

## 2020-11-26 DIAGNOSIS — K219 Gastro-esophageal reflux disease without esophagitis: Secondary | ICD-10-CM | POA: Diagnosis not present

## 2020-11-26 DIAGNOSIS — E782 Mixed hyperlipidemia: Secondary | ICD-10-CM | POA: Diagnosis not present

## 2020-12-19 DIAGNOSIS — M25511 Pain in right shoulder: Secondary | ICD-10-CM | POA: Diagnosis not present

## 2020-12-27 DIAGNOSIS — I2510 Atherosclerotic heart disease of native coronary artery without angina pectoris: Secondary | ICD-10-CM | POA: Diagnosis not present

## 2020-12-27 DIAGNOSIS — N184 Chronic kidney disease, stage 4 (severe): Secondary | ICD-10-CM | POA: Diagnosis not present

## 2020-12-27 DIAGNOSIS — G4700 Insomnia, unspecified: Secondary | ICD-10-CM | POA: Diagnosis not present

## 2020-12-27 DIAGNOSIS — K219 Gastro-esophageal reflux disease without esophagitis: Secondary | ICD-10-CM | POA: Diagnosis not present

## 2020-12-27 DIAGNOSIS — E782 Mixed hyperlipidemia: Secondary | ICD-10-CM | POA: Diagnosis not present

## 2020-12-27 DIAGNOSIS — I129 Hypertensive chronic kidney disease with stage 1 through stage 4 chronic kidney disease, or unspecified chronic kidney disease: Secondary | ICD-10-CM | POA: Diagnosis not present

## 2021-01-03 ENCOUNTER — Other Ambulatory Visit: Payer: Self-pay | Admitting: Interventional Cardiology

## 2021-01-29 DIAGNOSIS — I2510 Atherosclerotic heart disease of native coronary artery without angina pectoris: Secondary | ICD-10-CM | POA: Diagnosis not present

## 2021-01-29 DIAGNOSIS — K219 Gastro-esophageal reflux disease without esophagitis: Secondary | ICD-10-CM | POA: Diagnosis not present

## 2021-01-29 DIAGNOSIS — G4700 Insomnia, unspecified: Secondary | ICD-10-CM | POA: Diagnosis not present

## 2021-01-29 DIAGNOSIS — N184 Chronic kidney disease, stage 4 (severe): Secondary | ICD-10-CM | POA: Diagnosis not present

## 2021-01-29 DIAGNOSIS — I129 Hypertensive chronic kidney disease with stage 1 through stage 4 chronic kidney disease, or unspecified chronic kidney disease: Secondary | ICD-10-CM | POA: Diagnosis not present

## 2021-01-29 DIAGNOSIS — E782 Mixed hyperlipidemia: Secondary | ICD-10-CM | POA: Diagnosis not present

## 2021-02-25 DIAGNOSIS — I129 Hypertensive chronic kidney disease with stage 1 through stage 4 chronic kidney disease, or unspecified chronic kidney disease: Secondary | ICD-10-CM | POA: Diagnosis not present

## 2021-02-25 DIAGNOSIS — N184 Chronic kidney disease, stage 4 (severe): Secondary | ICD-10-CM | POA: Diagnosis not present

## 2021-02-25 DIAGNOSIS — G4700 Insomnia, unspecified: Secondary | ICD-10-CM | POA: Diagnosis not present

## 2021-02-25 DIAGNOSIS — K219 Gastro-esophageal reflux disease without esophagitis: Secondary | ICD-10-CM | POA: Diagnosis not present

## 2021-02-25 DIAGNOSIS — E782 Mixed hyperlipidemia: Secondary | ICD-10-CM | POA: Diagnosis not present

## 2021-02-26 ENCOUNTER — Other Ambulatory Visit: Payer: Self-pay | Admitting: Physician Assistant

## 2021-02-26 DIAGNOSIS — K219 Gastro-esophageal reflux disease without esophagitis: Secondary | ICD-10-CM

## 2021-02-27 DIAGNOSIS — L570 Actinic keratosis: Secondary | ICD-10-CM | POA: Diagnosis not present

## 2021-02-27 DIAGNOSIS — L821 Other seborrheic keratosis: Secondary | ICD-10-CM | POA: Diagnosis not present

## 2021-02-27 DIAGNOSIS — D485 Neoplasm of uncertain behavior of skin: Secondary | ICD-10-CM | POA: Diagnosis not present

## 2021-03-03 ENCOUNTER — Other Ambulatory Visit: Payer: Self-pay | Admitting: Interventional Cardiology

## 2021-03-03 ENCOUNTER — Other Ambulatory Visit: Payer: Self-pay | Admitting: Physician Assistant

## 2021-03-03 DIAGNOSIS — K219 Gastro-esophageal reflux disease without esophagitis: Secondary | ICD-10-CM

## 2021-03-07 ENCOUNTER — Other Ambulatory Visit: Payer: Self-pay | Admitting: *Deleted

## 2021-03-27 DIAGNOSIS — I10 Essential (primary) hypertension: Secondary | ICD-10-CM | POA: Diagnosis not present

## 2021-03-27 DIAGNOSIS — E782 Mixed hyperlipidemia: Secondary | ICD-10-CM | POA: Diagnosis not present

## 2021-04-15 DIAGNOSIS — I129 Hypertensive chronic kidney disease with stage 1 through stage 4 chronic kidney disease, or unspecified chronic kidney disease: Secondary | ICD-10-CM | POA: Diagnosis not present

## 2021-04-15 DIAGNOSIS — Z1389 Encounter for screening for other disorder: Secondary | ICD-10-CM | POA: Diagnosis not present

## 2021-04-15 DIAGNOSIS — I25111 Atherosclerotic heart disease of native coronary artery with angina pectoris with documented spasm: Secondary | ICD-10-CM | POA: Diagnosis not present

## 2021-04-15 DIAGNOSIS — N184 Chronic kidney disease, stage 4 (severe): Secondary | ICD-10-CM | POA: Diagnosis not present

## 2021-04-15 DIAGNOSIS — K219 Gastro-esophageal reflux disease without esophagitis: Secondary | ICD-10-CM | POA: Diagnosis not present

## 2021-04-15 DIAGNOSIS — M25511 Pain in right shoulder: Secondary | ICD-10-CM | POA: Diagnosis not present

## 2021-04-15 DIAGNOSIS — Z0000 Encounter for general adult medical examination without abnormal findings: Secondary | ICD-10-CM | POA: Diagnosis not present

## 2021-04-15 DIAGNOSIS — E782 Mixed hyperlipidemia: Secondary | ICD-10-CM | POA: Diagnosis not present

## 2021-04-15 DIAGNOSIS — D692 Other nonthrombocytopenic purpura: Secondary | ICD-10-CM | POA: Diagnosis not present

## 2021-04-15 DIAGNOSIS — G4700 Insomnia, unspecified: Secondary | ICD-10-CM | POA: Diagnosis not present

## 2021-04-28 DIAGNOSIS — E782 Mixed hyperlipidemia: Secondary | ICD-10-CM | POA: Diagnosis not present

## 2021-04-28 DIAGNOSIS — I129 Hypertensive chronic kidney disease with stage 1 through stage 4 chronic kidney disease, or unspecified chronic kidney disease: Secondary | ICD-10-CM | POA: Diagnosis not present

## 2021-04-28 DIAGNOSIS — N184 Chronic kidney disease, stage 4 (severe): Secondary | ICD-10-CM | POA: Diagnosis not present

## 2021-04-30 DIAGNOSIS — M75121 Complete rotator cuff tear or rupture of right shoulder, not specified as traumatic: Secondary | ICD-10-CM | POA: Diagnosis not present

## 2021-05-22 DIAGNOSIS — J8410 Pulmonary fibrosis, unspecified: Secondary | ICD-10-CM | POA: Diagnosis not present

## 2021-05-22 DIAGNOSIS — K7689 Other specified diseases of liver: Secondary | ICD-10-CM | POA: Diagnosis not present

## 2021-05-22 DIAGNOSIS — K5939 Other megacolon: Secondary | ICD-10-CM | POA: Diagnosis not present

## 2021-05-22 DIAGNOSIS — K5730 Diverticulosis of large intestine without perforation or abscess without bleeding: Secondary | ICD-10-CM | POA: Diagnosis not present

## 2021-05-22 DIAGNOSIS — I2510 Atherosclerotic heart disease of native coronary artery without angina pectoris: Secondary | ICD-10-CM | POA: Diagnosis not present

## 2021-05-23 DIAGNOSIS — E782 Mixed hyperlipidemia: Secondary | ICD-10-CM | POA: Diagnosis not present

## 2021-05-23 DIAGNOSIS — I10 Essential (primary) hypertension: Secondary | ICD-10-CM | POA: Diagnosis not present

## 2021-05-27 DIAGNOSIS — N3281 Overactive bladder: Secondary | ICD-10-CM | POA: Diagnosis not present

## 2021-06-23 DIAGNOSIS — E782 Mixed hyperlipidemia: Secondary | ICD-10-CM | POA: Diagnosis not present

## 2021-06-23 DIAGNOSIS — K219 Gastro-esophageal reflux disease without esophagitis: Secondary | ICD-10-CM | POA: Diagnosis not present

## 2021-06-23 DIAGNOSIS — G4700 Insomnia, unspecified: Secondary | ICD-10-CM | POA: Diagnosis not present

## 2021-06-23 DIAGNOSIS — N184 Chronic kidney disease, stage 4 (severe): Secondary | ICD-10-CM | POA: Diagnosis not present

## 2021-06-23 DIAGNOSIS — I129 Hypertensive chronic kidney disease with stage 1 through stage 4 chronic kidney disease, or unspecified chronic kidney disease: Secondary | ICD-10-CM | POA: Diagnosis not present

## 2021-08-19 ENCOUNTER — Other Ambulatory Visit: Payer: Self-pay | Admitting: Interventional Cardiology

## 2021-08-26 ENCOUNTER — Telehealth: Payer: Self-pay | Admitting: Interventional Cardiology

## 2021-08-26 MED ORDER — NITROGLYCERIN 0.4 MG SL SUBL: 0.4000 mg | SUBLINGUAL_TABLET | SUBLINGUAL | 0 refills | Status: DC | PRN

## 2021-08-26 NOTE — Telephone Encounter (Signed)
*  STAT* If patient is at the pharmacy, call can be transferred to refill team.   1. Which medications need to be refilled? (please list name of each medication and dose if known)  nitroGLYCERIN (NITROSTAT) 0.4 MG SL tablet  2. Which pharmacy/location (including street and city if local pharmacy) is medication to be sent to? [facility] - [place], [place] - [place] [address]  3. Do they need a 30 day or 90 day supply? 30 with refills

## 2021-08-26 NOTE — Telephone Encounter (Signed)
Refill complete 

## 2021-09-22 ENCOUNTER — Other Ambulatory Visit: Payer: Self-pay

## 2021-09-22 ENCOUNTER — Other Ambulatory Visit: Payer: Self-pay | Admitting: Interventional Cardiology

## 2021-09-22 ENCOUNTER — Telehealth: Payer: Self-pay | Admitting: Interventional Cardiology

## 2021-09-22 DIAGNOSIS — N184 Chronic kidney disease, stage 4 (severe): Secondary | ICD-10-CM | POA: Diagnosis not present

## 2021-09-22 DIAGNOSIS — F430 Acute stress reaction: Secondary | ICD-10-CM | POA: Insufficient documentation

## 2021-09-22 DIAGNOSIS — Z7901 Long term (current) use of anticoagulants: Secondary | ICD-10-CM | POA: Insufficient documentation

## 2021-09-22 DIAGNOSIS — I129 Hypertensive chronic kidney disease with stage 1 through stage 4 chronic kidney disease, or unspecified chronic kidney disease: Secondary | ICD-10-CM | POA: Diagnosis not present

## 2021-09-22 DIAGNOSIS — N3281 Overactive bladder: Secondary | ICD-10-CM | POA: Insufficient documentation

## 2021-09-22 DIAGNOSIS — I2510 Atherosclerotic heart disease of native coronary artery without angina pectoris: Secondary | ICD-10-CM | POA: Diagnosis not present

## 2021-09-22 DIAGNOSIS — E782 Mixed hyperlipidemia: Secondary | ICD-10-CM | POA: Diagnosis not present

## 2021-09-22 DIAGNOSIS — F321 Major depressive disorder, single episode, moderate: Secondary | ICD-10-CM | POA: Insufficient documentation

## 2021-09-22 DIAGNOSIS — F4320 Adjustment disorder, unspecified: Secondary | ICD-10-CM | POA: Insufficient documentation

## 2021-09-22 DIAGNOSIS — F4321 Adjustment disorder with depressed mood: Secondary | ICD-10-CM | POA: Insufficient documentation

## 2021-09-22 DIAGNOSIS — G4700 Insomnia, unspecified: Secondary | ICD-10-CM | POA: Insufficient documentation

## 2021-09-22 DIAGNOSIS — J309 Allergic rhinitis, unspecified: Secondary | ICD-10-CM | POA: Insufficient documentation

## 2021-09-22 DIAGNOSIS — C61 Malignant neoplasm of prostate: Secondary | ICD-10-CM | POA: Insufficient documentation

## 2021-09-22 DIAGNOSIS — N400 Enlarged prostate without lower urinary tract symptoms: Secondary | ICD-10-CM | POA: Insufficient documentation

## 2021-09-22 DIAGNOSIS — R1319 Other dysphagia: Secondary | ICD-10-CM | POA: Insufficient documentation

## 2021-09-22 DIAGNOSIS — K219 Gastro-esophageal reflux disease without esophagitis: Secondary | ICD-10-CM | POA: Insufficient documentation

## 2021-09-22 NOTE — Telephone Encounter (Signed)
Katie from [facility] called wanting to know if pt should still be taking clopidogrel. Please advise.

## 2021-09-23 NOTE — Telephone Encounter (Signed)
Pt c/o medication issue:  1. Name of Medication:   clopidogrel (PLAVIX) 75 MG tablet  2. How are you currently taking this medication (dosage and times per day)? Patient currently taking as prescribed  3. Are you having a reaction (difficulty breathing--STAT)?   No  4. What is your medication issue?   Caller was following up on whether patient should still be taking this medication.  At last OV it was discussed that this medication may be discontinued.

## 2021-09-23 NOTE — Telephone Encounter (Signed)
Called back [place] and a was advised that she has left for the day. Advised that we would call her back tomorrow.

## 2021-09-23 NOTE — Telephone Encounter (Signed)
Note from OV with Dr. Tamala Julian 08/08/2020  "Increasing carvedilol dose will help to further improve angina control and blood pressure.  Plavix will be discontinued after he completes his current supply."  Will send message to Dr. Thompson Caul nurse to follow up.

## 2021-09-24 NOTE — Telephone Encounter (Signed)
Katie from [facility] returned call.

## 2021-09-24 NOTE — Telephone Encounter (Signed)
Left voicemail (name on mailbox stated Brandon Harris), left message for Katie regarding Plavix. Per Dr. Tamala Julian patient to discontinue Plavix when finished with current prescription.  Provided office number for callback if any questions.

## 2021-10-20 DIAGNOSIS — Z961 Presence of intraocular lens: Secondary | ICD-10-CM | POA: Diagnosis not present

## 2021-10-20 DIAGNOSIS — H35371 Puckering of macula, right eye: Secondary | ICD-10-CM | POA: Diagnosis not present

## 2021-10-20 DIAGNOSIS — H5212 Myopia, left eye: Secondary | ICD-10-CM | POA: Diagnosis not present

## 2021-10-20 DIAGNOSIS — H53001 Unspecified amblyopia, right eye: Secondary | ICD-10-CM | POA: Diagnosis not present

## 2021-10-21 ENCOUNTER — Other Ambulatory Visit: Payer: Self-pay | Admitting: Interventional Cardiology

## 2021-10-23 DIAGNOSIS — N3281 Overactive bladder: Secondary | ICD-10-CM | POA: Diagnosis not present

## 2021-10-23 DIAGNOSIS — R634 Abnormal weight loss: Secondary | ICD-10-CM | POA: Diagnosis not present

## 2021-10-23 DIAGNOSIS — K219 Gastro-esophageal reflux disease without esophagitis: Secondary | ICD-10-CM | POA: Diagnosis not present

## 2021-10-23 DIAGNOSIS — N184 Chronic kidney disease, stage 4 (severe): Secondary | ICD-10-CM | POA: Diagnosis not present

## 2021-10-23 DIAGNOSIS — E782 Mixed hyperlipidemia: Secondary | ICD-10-CM | POA: Diagnosis not present

## 2021-10-23 DIAGNOSIS — I25111 Atherosclerotic heart disease of native coronary artery with angina pectoris with documented spasm: Secondary | ICD-10-CM | POA: Diagnosis not present

## 2021-10-23 DIAGNOSIS — Z23 Encounter for immunization: Secondary | ICD-10-CM | POA: Diagnosis not present

## 2021-10-23 DIAGNOSIS — G4700 Insomnia, unspecified: Secondary | ICD-10-CM | POA: Diagnosis not present

## 2021-10-23 DIAGNOSIS — I129 Hypertensive chronic kidney disease with stage 1 through stage 4 chronic kidney disease, or unspecified chronic kidney disease: Secondary | ICD-10-CM | POA: Diagnosis not present

## 2021-11-03 NOTE — Progress Notes (Unsigned)
Cardiology Office Note:    Date:  11/04/2021   ID:  Brandon Harris, DOB [birth year], MRN [id number]  PCP:  Antony Contras, MD   [place] [facility] Cardiologist:  Sinclair Grooms, MD     Referring MD: Antony Contras, MD   CC: Here for 1 year follow-up  History of Present Illness:    Brandon Harris is a 88 y.o. male with a hx of the following:  Bilateral carotid disease CAD, s/p CABG (2001) HLD Hx of CVA in the late 80's, completely recovered.  HTN Renal artery stenosis Mesenteric artery stenosis GERD CKD stage 4 Depression  Previous history of known CAD with prior CABG, catheterization in 2019 revealed severe LAD stenosis beyond LIMA insertion, was medically managed at that time.  2017 Myoview was low risk.  In 2020 he noted more DOE, this was felt to be his anginal equivalent but was felt to be best managed medically due to his history of CKD stage IV.  Lasix was added to the regimen.  Updated echo revealed normal EF.  Last seen by Dr. Tamala Julian on August 08, 2020.  He only reported minimal angina, but did not use nitroglycerin.  Overall doing well from a cardiac perspective and was physically active.  Carotid Dopplers from 2022 revealed right and left ICA 1 to 39% stenosis.  Coreg was increased to 12.5 twice daily to better improve systolic blood pressure, BP found to be 168/82 in office today. Repeat carotid Doppler showed similar findings.   Today he presents for 1 year follow-up appointment with his daughter who he lives with. He states overall there have been no changes to his health since 1 year ago.  Continues to have occasional dyspnea on exertion, this is chronic and stable for him.  Denies any chest pain, palpitations, syncope, dizziness, falls, presyncope, bleeding, swelling, orthopnea, PND, claudication.  Sees urologist around once per year for history of prostate cancer.  Has never seen nephrology in the past.  According to daughter's report, BP averages high 130s  to 709G, diastolics 28Z. Denies any other questions or concerns today.    Past Medical History:  Diagnosis Date   Arthritis    Carotid arterial disease ([place])    Chronic kidney disease (CKD), stage IV (severe) (HCC)    Diverticulosis    colonic diverticulosis   Gallstones    GERD (gastroesophageal reflux disease)    Hypertension    Myocardial infarction [facility])    Prostate cancer ([place])    Status post coronary artery bypass grafting     intraoperative cholangiogram   Stroke [facility])    patient reports no deficits    Urinary incontinence    bladder leakage - " patient wears a pad"    Past Surgical History:  Procedure Laterality Date   BACK SURGERY     x 4, has titanium in back per patient    CHOLECYSTECTOMY, LAPAROSCOPIC     CORONARY ARTERY BYPASS GRAFT  2001   CRYOTHERAPY     Prostate Cancer   LEFT HEART CATH AND CORS/GRAFTS ANGIOGRAPHY N/A 10/27/2017   Procedure: LEFT HEART CATH AND CORS/GRAFTS ANGIOGRAPHY;  Surgeon: Belva Crome, MD;  Location: [place] CV LAB;  Service: Cardiovascular;  Laterality: N/A;   prostate cancer     ROTATOR CUFF REPAIR     LEFT   TOTAL KNEE ARTHROPLASTY Right 12/18/2014   Procedure: TOTAL RIGHT KNEE ARTHROPLASTY;  Surgeon: Paralee Cancel, MD;  Location: [facility] OR[facility];  Service: Orthopedics;  Laterality:  Right;    Current Medications: Current Meds  Medication Sig   ALPRAZolam (XANAX) 0.25 MG tablet Take 0.25 mg by mouth at bedtime as needed for anxiety or sleep. TAKE ONE TABLET AT NIGHT   amitriptyline (ELAVIL) 10 MG tablet Take 20 mg by mouth in the morning and at bedtime.    amLODipine (NORVASC) 10 MG tablet TAKE ONE TABLET BY MOUTH EVERY MORNING   aspirin EC 81 MG tablet Take 1 tablet (81 mg total) by mouth daily.   atorvastatin (LIPITOR) 80 MG tablet Take 80 mg by mouth every evening.    carvedilol (COREG) 12.5 MG tablet Take 12.5 mg by mouth 2 (two) times daily with a meal.   hydrALAZINE (APRESOLINE) 25 MG tablet TAKE ONE TABLET BY MOUTH EVERY  MORNING and TAKE ONE TABLET BY MOUTH AT NOON and TAKE ONE TABLET BY MOUTH EVERYDAY AT BEDTIME   hydrALAZINE (APRESOLINE) 50 MG tablet TAKE ONE TABLET BY MOUTH EVERY MORNING and TAKE ONE TABLET BY MOUTH AT NOON and TAKE ONE TABLET BY MOUTH EVERYDAY AT BEDTIME   isosorbide mononitrate (IMDUR) 60 MG 24 hr tablet TAKE ONE TABLET BY MOUTH EVERY MORNING   Melatonin 10 MG TABS Take 10 mg by mouth daily.   mirtazapine (REMERON) 15 MG tablet Take 15 mg by mouth at bedtime.   nitroGLYCERIN (NITROSTAT) 0.4 MG SL tablet Dissolve 1 tab under tongue as needed for chest pain. May repeat every 5 minutes x 2 doses. If no relief call 9-1-1.   pantoprazole (PROTONIX) 40 MG tablet TAKE ONE TABLET BY MOUTH EVERY MORNING   tamsulosin (FLOMAX) 0.4 MG CAPS capsule Take 0.8 mg by mouth daily. Take two (2) capsules (0.8 mg) by mouth daily.    traMADol (ULTRAM) 50 MG tablet Take 50 mg by mouth every 6 (six) hours.   traMADol (ULTRAM) 50 MG tablet Take by mouth every 6 (six) hours as needed.   zolpidem (AMBIEN) 5 MG tablet Take 5 mg by mouth at bedtime.    clopidogrel (PLAVIX) 75 MG tablet Take 1 tablet (75 mg total) by mouth every morning.     Allergies:   Amlodipine besylate, Mirabegron, and Vibegron   Social History   Socioeconomic History   Marital status: Widowed    Spouse name: Not on file   Number of children: Not on file   Years of education: Not on file   Highest education level: Not on file  Occupational History   Not on file  Tobacco Use   Smoking status: Never   Smokeless tobacco: Never  Vaping Use   Vaping Use: Never used  Substance and Sexual Activity   Alcohol use: No   Drug use: No   Sexual activity: Not Currently  Other Topics Concern   Not on file  Social History Narrative   Not on file   Social Determinants of Health   Financial Resource Strain: Not on file  Food Insecurity: Not on file  Transportation Needs: Not on file  Physical Activity: Not on file  Stress: Not on file   Social Connections: Not on file     Family History: The patient's family history includes Heart failure in his mother.  ROS:   Review of Systems  Constitutional: Negative.   HENT: Negative.    Eyes: Negative.   Respiratory:  Positive for shortness of breath. Negative for cough, hemoptysis, sputum production and wheezing.        Chronic, stable DOE.  See HPI.  Cardiovascular: Negative.   Gastrointestinal: Negative.  Genitourinary: Negative.   Musculoskeletal:  Positive for back pain and joint pain. Negative for falls, myalgias and neck pain.       Previous history of back surgery and joint surgery.  See past medical history.  Skin: Negative.   Neurological: Negative.   Endo/Heme/Allergies:  Negative for environmental allergies and polydipsia. Bruises/bleeds easily.  Psychiatric/Behavioral: Negative.      Please see the history of present illness.    All other systems reviewed and are negative.  EKGs/Labs/Other Studies Reviewed:    The following studies were reviewed today:   EKG:  EKG is ordered today.  The ekg ordered today demonstrates sinus rhythm, 69 bpm, with PAC.  Artifact noted on leads V1, II, and V5.  T wave abnormality and nonspecific ST-T wave changes, seen on previous EKGs, and appears similar to prior.  Bilateral Carotid Duplex on 09/13/2020: Right Carotid: Velocities in the right ICA are consistent with a 1-39%  stenosis. Non-hemodynamically significant plaque <50% noted in the  CCA. The ECA appears >50% stenosed.   Left Carotid: Velocities in the left ICA are consistent with a 1-39%  stenosis. Non-hemodynamically significant plaque <50% noted in the  CCA. The ECA appears >50% stenosed.   Vertebrals:  Right vertebral artery demonstrates high resistant flow.  Right vertebral artery demonstrates an occlusion.  Subclavians: Normal flow hemodynamics were seen in bilateral subclavian arteries.   2D echocardiogram on July 26, 2019: 1. Left ventricular ejection  fraction, by estimation, is 55 to 60%. The  left ventricle has normal function. The left ventricle has no regional  wall motion abnormalities. Left ventricular diastolic parameters are  consistent with Grade I diastolic  dysfunction (impaired relaxation).   2. Right ventricular systolic function is normal. The right ventricular  size is mildly enlarged.   3. The mitral valve is grossly normal. Mild mitral valve regurgitation.  No evidence of mitral stenosis.   4. The aortic valve is tricuspid. Aortic valve regurgitation is mild.  Mild aortic valve sclerosis is present, with no evidence of aortic valve  stenosis.   Bilateral renal artery duplex on July 14, 2019: Renal:    Right: 1-59% stenosis of the right renal artery. RRV flow present.         Cyst(s) noted. Normal size right kidney. Abnormal right         Resistive Index. Normal cortical thickness of right kidney.  Left:  1-59% stenosis of the left renal artery. LRV flow present.         Normal size of left kidney, although smaller than the right         kidney. Abnormal left Resisitve Index. Normal cortical         thickness of the left kidney.  Mesenteric: 70 to 99% stenosis in the celiac artery and superior mesenteric artery. Areas of limited visceral study include left parenchymal flow, mesenteric arteries, right parenchymal flow, right renal artery, left renal artery and right kidney size due to overlying bowel gas and shadowing.   Left heart cath on October 27, 2017: Widely patent bypass grafts including sequential saphenous vein graft to PDA and PL, saphenous vein graft to OM, saphenous vein graft to LAD, and LIMA to OM1. Severe native vessel disease including heavy diffuse calcification in all territories. Segmental 50% left main. Ostial occlusion of the right coronary. Total occlusion of the mid LAD after the first diagonal.  The LAD beyond the saphenous vein graft insertion into the mid to distal LAD contains calcified  relatively focal  95% stenosis. Total occlusion of the proximal circumflex. Normal LVEDP   RECOMMENDATIONS:   Continue medical therapy Four additional hours of IV hydration prior to discharge.  Kidney function on Friday. Plavix 75 mg/day. Consider PTCA of LAD via the saphenous vein graft once kidneys recover and if symptoms warrant.  Nuclear medicine stress test on Jun 10, 2015: Nuclear stress EF: 65%. There was no ST segment deviation noted during stress. Defect 1: There is a small defect of moderate severity. The study is normal. This is a low risk study.   Low risk stress nuclear study with a small apical thinning artifact, otherwise normal perfusion and normal left ventricular regional and global systolic function.   Recent Labs: No results found for requested labs within last 365 days.  Recent Lipid Panel No results found for: "CHOL", "TRIG", "HDL", "CHOLHDL", "VLDL", "LDLCALC", "LDLDIRECT"  Physical Exam:    VS:  BP 135/62 (BP Location: Left Arm, Patient Position: Sitting, Cuff Size: Normal)   Pulse 69   Ht 5' 8"  (1.727 m)   Wt 185 lb 6.4 oz (84.1 kg)   SpO2 98%   BMI 28.19 kg/m     Wt Readings from Last 3 Encounters:  11/04/21 185 lb 6.4 oz (84.1 kg)  08/08/20 203 lb 3.2 oz (92.2 kg)  11/24/19 201 lb 6.4 oz (91.4 kg)     GEN: Frail 88 y.o. Caucasian male in no acute distress NECK: No JVD; No carotid bruits CARDIAC: S1/S2, RRR, no murmurs, rubs, gallops; 2+ peripheral pulses throughout, strong and equal bilaterally RESPIRATORY:  Clear and diminished to auscultation without rales, wheezing or rhonchi  ABDOMEN: Soft, non-tender, non-distended, bowel sounds x4 MUSCULOSKELETAL:  No edema; thoracic kyphosis noted, arthritis along bilateral knuckles along hands, otherwise no deformity SKIN: Thin skin, scattered bruising along bilateral forearms, warm and dry, otherwise no abnormality NEUROLOGIC:  Alert and oriented x 3 PSYCHIATRIC:  Normal affect   ASSESSMENT:    1.  Coronary artery disease involving native coronary artery of native heart without angina pectoris   2. S/P CABG (coronary artery bypass graft)   3. Hyperlipidemia, unspecified hyperlipidemia type   4. Bilateral carotid artery disease, unspecified type ([place])   5. Essential hypertension   6. RAS (renal artery stenosis) ([place])   7. Mesenteric artery stenosis (HCC)   8. CKD (chronic kidney disease) stage 4, GFR 15-29 ml/min (HCC)   9. History of CVA (cerebrovascular accident)    PLAN:    In order of problems listed above:  CAD, s/p CABG, hyperlipidemia with LDL goal less than 70 Stable with no anginal symptoms.  Has not had to take nitroglycerin in the past year.  No indication for ischemic evaluation.  Has not discontinued Plavix as previously discussed 1 month ago.  Will D/C Plavix today.  LDL 61.  Continue Lipitor, aspirin, carvedilol 12.5 twice daily, Imdur 60 mg daily, and nitroglycerin as needed for chest pain.   2.  Bilateral carotid artery disease Carotid Doppler from 1 year ago revealed 1 to 39% bilateral stenosis along ICA.  No bruit heard on exam.  Continue current medication regimen.  Continue follow-up with [facility].  3. HTN, Renal artery stenosis BP on arrival 162/80, repeat manual by me 135/62.  History of renal artery stenosis.  Continue to follow-up with [facility].  Will place nephrology referral as outlined below.  SBP goal less than 140.  We will send me BP log within 2 weeks via MyChart, if SBP is consistently greater than 140, plan to increase Coreg  from 12.5 mg twice daily to 25 mg twice daily.  4. Mesenteric artery stenosis Patient denies any abdominal pain.  Continue to follow-up with [facility].  5.   CKD stage 4 Most recent serum creatinine is around his baseline (2.060).  Most recent eGFR on file is 28.  He is agreeable for me to place a nephrology referral and we will arrange this for him today.  6. Hx of CVA Remote.  No neurological deficits. Continue ASA and statin. Continue to  follow with PCP.  7.  Disposition: Follow-up with Dr. Tamala Julian or APP in 3 to 4 months or sooner if anything changes.   Medication Adjustments/Labs and Tests Ordered: Current medicines are reviewed at length with the patient today.  Concerns regarding medicines are outlined above.  Orders Placed This Encounter  Procedures   Ambulatory referral to Nephrology   EKG 12-Lead   No orders of the defined types were placed in this encounter.   Patient Instructions  Medication Instructions:  Your physician has recommended you make the following change in your medication:  1.  STOP Plavix (Clopidogrel)    *If you need a refill on your cardiac medications before your next appointment, please call your pharmacy*   Lab Work: None ordered  If you have labs (blood work) drawn today and your tests are completely normal, you will receive your results only by: [place] (if you have MyChart) OR A paper copy in the mail If you have any lab test that is abnormal or we need to change your treatment, we will call you to review the results.   Testing/Procedures: None ordered   You have been referred to [facility].  They will call you to schedule an appointment.   Follow-Up: At [facility], you and your health needs are our priority.  As part of our continuing mission to provide you with exceptional heart care, we have created designated Provider Care Teams.  These Care Teams include your primary Cardiologist (physician) and Advanced Practice Providers (APPs -  Physician Assistants and Nurse Practitioners) who all work together to provide you with the care you need, when you need it.  We recommend signing up for the patient portal called "MyChart".  Sign up information is provided on this After Visit Summary.  MyChart is used to connect with patients for Virtual Visits (Telemedicine).  Patients are able to view lab/test results, encounter notes, upcoming appointments,  etc.  Non-urgent messages can be sent to your provider as well.   To learn more about what you can do with MyChart, go to [email or web address].    Your next appointment:   4-6 MONTHS  The format for your next appointment:   In Person  Provider:   Richardson Dopp, PA-C         Other Instructions Your physician has requested that you regularly monitor and record your blood pressure readings at home. Please use the same machine at the same time of day to check your readings and record them to bring to your follow-up visit.   Please monitor blood pressures and keep a log of your readings. If your blood pressure is consistently running over 140, please call and let us know.   Make sure to check 2 hours after your medications.    AVOID these things for 30 minutes before checking your blood pressure: No Drinking caffeine. No Drinking alcohol. No Eating. No Smoking. No Exercising.   Five minutes before checking your blood pressure: Pee. Sit in  a dining chair. Avoid sitting in a soft couch or armchair. Be quiet. Do not talk   Important Information About Sugar         Signed, Finis Bud, NP  11/04/2021 1:09 PM    [place]

## 2021-11-04 ENCOUNTER — Encounter: Payer: Self-pay | Admitting: Nurse Practitioner

## 2021-11-04 ENCOUNTER — Ambulatory Visit: Payer: Medicare Other | Attending: Physician Assistant | Admitting: Nurse Practitioner

## 2021-11-04 VITALS — BP 135/62 | HR 69 | Ht 68.0 in | Wt 185.4 lb

## 2021-11-04 DIAGNOSIS — I701 Atherosclerosis of renal artery: Secondary | ICD-10-CM | POA: Diagnosis not present

## 2021-11-04 DIAGNOSIS — N184 Chronic kidney disease, stage 4 (severe): Secondary | ICD-10-CM | POA: Diagnosis not present

## 2021-11-04 DIAGNOSIS — I779 Disorder of arteries and arterioles, unspecified: Secondary | ICD-10-CM | POA: Diagnosis not present

## 2021-11-04 DIAGNOSIS — I2510 Atherosclerotic heart disease of native coronary artery without angina pectoris: Secondary | ICD-10-CM | POA: Diagnosis not present

## 2021-11-04 DIAGNOSIS — E785 Hyperlipidemia, unspecified: Secondary | ICD-10-CM | POA: Diagnosis not present

## 2021-11-04 DIAGNOSIS — Z8673 Personal history of transient ischemic attack (TIA), and cerebral infarction without residual deficits: Secondary | ICD-10-CM

## 2021-11-04 DIAGNOSIS — Z951 Presence of aortocoronary bypass graft: Secondary | ICD-10-CM | POA: Diagnosis not present

## 2021-11-04 DIAGNOSIS — I10 Essential (primary) hypertension: Secondary | ICD-10-CM

## 2021-11-04 DIAGNOSIS — K551 Chronic vascular disorders of intestine: Secondary | ICD-10-CM | POA: Diagnosis not present

## 2021-11-04 NOTE — Patient Instructions (Addendum)
Medication Instructions:  Your physician has recommended you make the following change in your medication:  1.  STOP Plavix (Clopidogrel)    *If you need a refill on your cardiac medications before your next appointment, please call your pharmacy*   Lab Work: None ordered  If you have labs (blood work) drawn today and your tests are completely normal, you will receive your results only by: [place] (if you have MyChart) OR A paper copy in the mail If you have any lab test that is abnormal or we need to change your treatment, we will call you to review the results.   Testing/Procedures: None ordered   You have been referred to [facility].  They will call you to schedule an appointment.   Follow-Up: At [facility], you and your health needs are our priority.  As part of our continuing mission to provide you with exceptional heart care, we have created designated Provider Care Teams.  These Care Teams include your primary Cardiologist (physician) and Advanced Practice Providers (APPs -  Physician Assistants and Nurse Practitioners) who all work together to provide you with the care you need, when you need it.  We recommend signing up for the patient portal called "MyChart".  Sign up information is provided on this After Visit Summary.  MyChart is used to connect with patients for Virtual Visits (Telemedicine).  Patients are able to view lab/test results, encounter notes, upcoming appointments, etc.  Non-urgent messages can be sent to your provider as well.   To learn more about what you can do with MyChart, go to [email or web address].    Your next appointment:   4-6 MONTHS  The format for your next appointment:   In Person  Provider:   Richardson Dopp, PA-C         Other Instructions Your physician has requested that you regularly monitor and record your blood pressure readings at home. Please use the same machine at the same time of day to check  your readings and record them to bring to your follow-up visit.   Please monitor blood pressures and keep a log of your readings. If your blood pressure is consistently running over 140, please call and let us know.   Make sure to check 2 hours after your medications.    AVOID these things for 30 minutes before checking your blood pressure: No Drinking caffeine. No Drinking alcohol. No Eating. No Smoking. No Exercising.   Five minutes before checking your blood pressure: Pee. Sit in a dining chair. Avoid sitting in a soft couch or armchair. Be quiet. Do not talk   Important Information About Sugar

## 2021-11-15 ENCOUNTER — Emergency Department (HOSPITAL_COMMUNITY): Payer: Medicare Other

## 2021-11-15 ENCOUNTER — Emergency Department (HOSPITAL_COMMUNITY)
Admission: EM | Admit: 2021-11-15 | Discharge: 2021-11-15 | Disposition: A | Discharge status: Home / Self Care | Payer: Medicare Other | Attending: Emergency Medicine | Admitting: Emergency Medicine

## 2021-11-15 ENCOUNTER — Encounter (HOSPITAL_COMMUNITY): Payer: Self-pay

## 2021-11-15 ENCOUNTER — Other Ambulatory Visit: Payer: Self-pay

## 2021-11-15 DIAGNOSIS — Z79899 Other long term (current) drug therapy: Secondary | ICD-10-CM | POA: Insufficient documentation

## 2021-11-15 DIAGNOSIS — Z7982 Long term (current) use of aspirin: Secondary | ICD-10-CM | POA: Diagnosis not present

## 2021-11-15 DIAGNOSIS — S60922A Unspecified superficial injury of left hand, initial encounter: Secondary | ICD-10-CM | POA: Diagnosis present

## 2021-11-15 DIAGNOSIS — Z23 Encounter for immunization: Secondary | ICD-10-CM | POA: Diagnosis not present

## 2021-11-15 DIAGNOSIS — S61412A Laceration without foreign body of left hand, initial encounter: Secondary | ICD-10-CM | POA: Diagnosis not present

## 2021-11-15 MED ORDER — ACETAMINOPHEN 325 MG PO TABS
650.0000 mg | ORAL_TABLET | Freq: Once | ORAL | Status: AC
  Administered 2021-11-15: 650 mg via ORAL
  Filled 2021-11-15: qty 2

## 2021-11-15 MED ORDER — LIDOCAINE-EPINEPHRINE (PF) 2 %-1:200000 IJ SOLN
10.0000 mL | Freq: Once | INTRAMUSCULAR | Status: AC
  Administered 2021-11-15: 10 mL
  Filled 2021-11-15: qty 20

## 2021-11-15 MED ORDER — LIDOCAINE HCL (PF) 1 % IJ SOLN: 30.0000 mL | Freq: Once | INTRAMUSCULAR | Status: DC

## 2021-11-15 NOTE — ED Triage Notes (Signed)
Patient was getting off of his ractor and hit his hand on something , cutting his left hand. Patient is currently taking aspirin. Patient bleeding. Pressure dressing applied by ED triage tech.

## 2021-11-15 NOTE — ED Provider Notes (Signed)
[facility] DEPT Provider Note   CSN: [id number] Arrival date & time: 11/15/21  1826     History  Chief Complaint  Patient presents with   Extremity Laceration    Brandon Harris is a 88 y.o. male.  Who presents ED for evaluation of left dorsal hand laceration.  He states that occurred at approximately 11:30 AM today when he was getting off his tractor.  States he has not gotten the bleeding to stop with pressure at home.  Denies numbness, tingling, decreased range of motion.  States his tetanus has been updated within the past 5 years.  HPI     Home Medications Prior to Admission medications   Medication Sig Start Date End Date Taking? Authorizing Provider  ALPRAZolam Duanne Moron) 0.25 MG tablet Take 0.25 mg by mouth at bedtime as needed for anxiety or sleep. TAKE ONE TABLET AT NIGHT 02/13/13   [provider]  amitriptyline (ELAVIL) 10 MG tablet Take 20 mg by mouth in the morning and at bedtime.  08/08/17   [provider]  amLODipine (NORVASC) 10 MG tablet TAKE ONE TABLET BY MOUTH EVERY MORNING 11/20/20   Belva Crome, MD  aspirin EC 81 MG tablet Take 1 tablet (81 mg total) by mouth daily. 06/03/16   Belva Crome, MD  atorvastatin (LIPITOR) 80 MG tablet Take 80 mg by mouth every evening.     [provider]  carvedilol (COREG) 12.5 MG tablet Take 12.5 mg by mouth 2 (two) times daily with a meal.    [provider]  hydrALAZINE (APRESOLINE) 25 MG tablet TAKE ONE TABLET BY MOUTH EVERY MORNING and TAKE ONE TABLET BY MOUTH AT NOON and TAKE ONE TABLET BY MOUTH EVERYDAY AT BEDTIME 10/21/21   Belva Crome, MD  hydrALAZINE (APRESOLINE) 50 MG tablet TAKE ONE TABLET BY MOUTH EVERY MORNING and TAKE ONE TABLET BY MOUTH AT NOON and TAKE ONE TABLET BY MOUTH EVERYDAY AT BEDTIME 10/21/21   Belva Crome, MD  isosorbide mononitrate (IMDUR) 60 MG 24 hr tablet TAKE ONE TABLET BY MOUTH EVERY MORNING 10/21/21   Belva Crome, MD  Melatonin 10  MG TABS Take 10 mg by mouth daily.    [provider]  mirtazapine (REMERON) 15 MG tablet Take 15 mg by mouth at bedtime. 11/09/19   [provider]  nitroGLYCERIN (NITROSTAT) 0.4 MG SL tablet Dissolve 1 tab under tongue as needed for chest pain. May repeat every 5 minutes x 2 doses. If no relief call 9-1-1. 09/22/21   Belva Crome, MD  pantoprazole (PROTONIX) 40 MG tablet TAKE ONE TABLET BY MOUTH EVERY MORNING 06/01/19   Belva Crome, MD  tamsulosin (FLOMAX) 0.4 MG CAPS capsule Take 0.8 mg by mouth daily. Take two (2) capsules (0.8 mg) by mouth daily.  08/08/17   [provider]  traMADol (ULTRAM) 50 MG tablet Take 50 mg by mouth every 6 (six) hours. 06/07/20   [provider]  traMADol (ULTRAM) 50 MG tablet Take by mouth every 6 (six) hours as needed.    [provider]  zolpidem (AMBIEN) 5 MG tablet Take 5 mg by mouth at bedtime. 09/01/21   [provider]      Allergies    Amlodipine besylate, Mirabegron, and Vibegron    Review of Systems   Review of Systems  Skin:  Positive for wound.  All other systems reviewed and are negative.   Physical Exam Updated Vital Signs BP 134/69 (BP  Location: Right Arm)   Pulse 77   Temp 97.6 F (36.4 C) (Oral)   Resp 18   Ht '5\' 8"'$  (1.727 m)   Wt 84.1 kg   SpO2 100%   BMI 28.19 kg/m  Physical Exam Vitals and nursing note reviewed.  Constitutional:      General: He is not in acute distress.    Appearance: Normal appearance. He is normal weight. He is not ill-appearing.  HENT:     Head: Normocephalic and atraumatic.  Pulmonary:     Effort: Pulmonary effort is normal. No respiratory distress.  Abdominal:     General: Abdomen is flat.  Musculoskeletal:        General: Normal range of motion.     Right hand: Normal.     Left hand: Laceration present. No bony tenderness. Normal range of motion. Normal strength. Normal sensation. Normal capillary refill. Normal pulse.     Cervical back: Neck  supple.     Comments: Sensation, strength, range of motion, cap refill of the left hand intact.  Skin:    General: Skin is warm and dry.     Capillary Refill: Capillary refill takes less than 2 seconds.     Comments: Approximately 5 cm laceration to ulnar and volar aspect of the left hand.  Neurological:     Mental Status: He is alert and oriented to person, place, and time.  Psychiatric:        Mood and Affect: Mood normal.        Behavior: Behavior normal.     ED Results / Procedures / Treatments   Labs (all labs ordered are listed, but only abnormal results are displayed) Labs Reviewed - No data to display  EKG None  Radiology DG Hand Complete Left  Result Date: 11/15/2021 CLINICAL DATA:  Left hand laceration EXAM: LEFT HAND - COMPLETE 3+ VIEW COMPARISON:  None Available. FINDINGS: Frontal, oblique, lateral views of the left hand are obtained. Evaluation is limited by overlying bandaging. Soft tissue laceration dorsal lateral margin of the left hand. No radiopaque foreign body. No evidence of underlying fracture. 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 There is diffuse atherosclerosis. IMPRESSION: 1. Laceration ulnar aspect of the hand. No radiopaque foreign body or underlying fracture. 2. 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 Electronically Signed   By: Randa Ngo M.D.   On: 11/15/2021 20:27    Procedures .Marland KitchenLaceration Repair  Date/Time: 11/15/2021 10:00 PM  Performed by: Roylene Reason, PA-C Authorized by: Roylene Reason, PA-C   Consent:    Consent obtained:  Verbal   Consent given by:  Patient   Risks discussed:  Infection, need for additional repair, pain, poor cosmetic result and poor wound healing   Alternatives discussed:  No treatment and delayed treatment Universal protocol:    Procedure explained and questions answered to patient or proxy's satisfaction: yes     Relevant documents present and verified: yes      Test results available: yes     Imaging studies available: yes     Immediately prior to procedure, a time out was called: yes     Patient identity confirmed:  Verbally with patient and arm band Anesthesia:    Anesthesia method:  Local infiltration   Local anesthetic:  Lidocaine 2% WITH epi Laceration details:    Location:  Hand   Hand location:  L hand, dorsum   Length (cm):  6 Pre-procedure details:    Preparation:  Patient was  prepped and draped in usual sterile fashion Exploration:    Hemostasis achieved with:  Epinephrine Treatment:    Area cleansed with:  Povidone-iodine   Amount of cleaning:  Extensive   Irrigation solution:  Sterile saline   Irrigation volume:  1000 cc   Irrigation method:  Pressure wash Skin repair:    Repair method:  Steri-Strips   Number of Steri-Strips:  10 Approximation:    Approximation:  Loose Repair type:    Repair type:  Simple Post-procedure details:    Dressing:  Adhesive bandage and sterile dressing   Procedure completion:  Tolerated     Medications Ordered in ED Medications  acetaminophen (TYLENOL) tablet 650 mg (has no administration in time range)  lidocaine-EPINEPHrine (XYLOCAINE W/EPI) 2 %-1:200000 (PF) injection 10 mL (has no administration in time range)    ED Course/ Medical Decision Making/ A&P                           Medical Decision Making Amount and/or Complexity of Data Reviewed Radiology: ordered.  Risk OTC drugs. Prescription drug management.  This patient presents to the ED for concern of laceration to the left hand   Co morbidities that complicate the patient evaluation  Aspirin therapy  Additional history obtained from: Nursing notes from this visit.   I ordered imaging studies including x-ray left hand I independently visualized and interpreted imaging which showed no fractures or retained foreign bodies I agree with the radiologist interpretation  Afebrile, hemodynamically stable.  Patient is a  88 year old male who presents ED for evaluation of laceration to the dorsum of the left hand on the ulnar surface.  Neurovascular status intact and x-ray negative.  Patient states the wound has been oozing since the incident which was at approximately 11:30 AM despite the use of pressure dressings.  Patient takes 1 baby aspirin daily and does not use any DOACs. hemostasis was achieved with direct infiltration of lidocaine with epinephrine and immediate pressure dressing.  Patient has extremely thin skin and is prone to skin tears.  Thorough shared decision making conversation was had with patient and he decided that thorough irrigation and closure with Steri-Strips and Tegaderm dressing was the most appropriate course of action.  I agree as I believe sutures would just tear the skin and likely not hold the skin together.  The wound was repaired as stated above.  Patient was given strict return precautions including signs of infection.  He was informed to keep the dressing in place for at least the next 3 days to promote wound edge healing.  Informed to follow-up with his primary care provider so that they may continue to monitor the wound.  Stable at discharge.  At this time there does not appear to be any evidence of an acute emergency medical condition and the patient appears stable for discharge with appropriate outpatient follow up. Diagnosis was discussed with patient who verbalizes understanding of care plan and is agreeable to discharge. I have discussed return precautions with patient and daughter who verbalizes understanding. Patient encouraged to follow-up with their PCP within 1 week. All questions answered.  Patient's case discussed with Dr. Laverta Baltimore who agrees with plan to discharge with follow-up.   Note: Portions of this report may have been transcribed using voice recognition software. Every effort was made to ensure accuracy; however, inadvertent computerized transcription errors may still be  present.          Final Clinical Impression(s) /  ED Diagnoses Final diagnoses:  None    Rx / DC Orders ED Discharge Orders     None         Nehemiah Massed 11/15/21 2223    Margette Fast, MD 11/16/21 1136

## 2021-11-15 NOTE — ED Notes (Signed)
Pt provided discharge instructions and prescription information. Pt was given the opportunity to ask questions and questions were answered.   

## 2021-11-15 NOTE — Discharge Instructions (Addendum)
You have been seen today for your complaint of left hand laceration. Your imaging was reassuring and showed no fractures or retained foreign bodies. Your discharge medications include ibuprofen and Tylenol.  You may alternate these 2 every 4 hours as needed for pain.  You may take up to 500 mg of Tylenol up to 400 mg of ibuprofen. Home care instructions are as follows:  You should keep the area clean and dry with the pressure dressing intact for the next 4 days.  After that you may remove your dressing and clean the wound with soap and water daily and then reapply the dressing. Follow up with: Your primary care provider in 1 week Please seek immediate medical care if you develop any of the following symptoms: You have very bad swelling around the wound. Your pain suddenly gets worse and is very bad. You have painful lumps near the wound or on skin anywhere on your body. You have a red streak going away from your wound. The wound is on your hand or foot, and: You cannot move a finger or toe. Your fingers or toes look pale or bluish. At this time there does not appear to be the presence of an emergent medical condition, however there is always the potential for conditions to change. Please read and follow the below instructions.  Do not take your medicine if  develop an itchy rash, swelling in your mouth or lips, or difficulty breathing; call 911 and seek immediate emergency medical attention if this occurs.  You may review your lab tests and imaging results in their entirety on your MyChart account.  Please discuss all results of fully with your primary care provider and other specialist at your follow-up visit.  Note: Portions of this text may have been transcribed using voice recognition software. Every effort was made to ensure accuracy; however, inadvertent computerized transcription errors may still be present.

## 2021-11-18 ENCOUNTER — Ambulatory Visit: Payer: Medicare Other | Admitting: Physician Assistant

## 2021-11-19 ENCOUNTER — Other Ambulatory Visit: Payer: Self-pay | Admitting: Interventional Cardiology

## 2021-11-20 ENCOUNTER — Telehealth: Payer: Self-pay

## 2021-11-20 NOTE — Telephone Encounter (Signed)
   Reason for call: ED-Follow up call   Patient  visit on 11/15/2021  at [facility] you been able to follow up with your primary care physician? - N/A - Patient advised he spoke to clinic no appt needed  The patient was or was not able to obtain any needed medicine or equipment. Was, Yes  Are there diet recommendations that you are having difficulty following? - No  Patient expresses understanding of discharge instructions and education provided has no other needs at this time.   [facility]  [place], [place]  Main Phone: [phone]  E-mail: [email or web address]  Website: [email or web address]

## 2021-12-15 ENCOUNTER — Ambulatory Visit: Payer: Medicare Other | Admitting: Physician Assistant

## 2021-12-22 DIAGNOSIS — K219 Gastro-esophageal reflux disease without esophagitis: Secondary | ICD-10-CM | POA: Diagnosis not present

## 2021-12-22 DIAGNOSIS — I129 Hypertensive chronic kidney disease with stage 1 through stage 4 chronic kidney disease, or unspecified chronic kidney disease: Secondary | ICD-10-CM | POA: Diagnosis not present

## 2021-12-22 DIAGNOSIS — N184 Chronic kidney disease, stage 4 (severe): Secondary | ICD-10-CM | POA: Diagnosis not present

## 2021-12-22 DIAGNOSIS — I2510 Atherosclerotic heart disease of native coronary artery without angina pectoris: Secondary | ICD-10-CM | POA: Diagnosis not present

## 2021-12-22 DIAGNOSIS — E782 Mixed hyperlipidemia: Secondary | ICD-10-CM | POA: Diagnosis not present

## 2021-12-24 ENCOUNTER — Other Ambulatory Visit: Payer: Self-pay | Admitting: *Deleted

## 2021-12-24 DIAGNOSIS — I10 Essential (primary) hypertension: Secondary | ICD-10-CM

## 2021-12-24 NOTE — Addendum Note (Signed)
Addended by: Gaetano Net on: 12/24/2021 09:05 AM   Modules accepted: Orders

## 2021-12-26 DIAGNOSIS — I10 Essential (primary) hypertension: Secondary | ICD-10-CM | POA: Diagnosis not present

## 2021-12-27 LAB — BASIC METABOLIC PANEL
BUN/Creatinine Ratio: 9 — ABNORMAL LOW (ref 10–24)
BUN: 20 mg/dL (ref 10–36)
CO2: 21 mmol/L (ref 20–29)
Calcium: 9.2 mg/dL (ref 8.6–10.2)
Chloride: 104 mmol/L (ref 96–106)
Creatinine, Ser: 2.11 mg/dL — ABNORMAL HIGH (ref 0.76–1.27)
Glucose: 107 mg/dL — ABNORMAL HIGH (ref 70–99)
Potassium: 4.7 mmol/L (ref 3.5–5.2)
Sodium: 139 mmol/L (ref 134–144)
eGFR: 29 mL/min/{1.73_m2} — ABNORMAL LOW (ref >59)

## 2022-01-09 ENCOUNTER — Other Ambulatory Visit: Payer: Self-pay

## 2022-01-09 DIAGNOSIS — N184 Chronic kidney disease, stage 4 (severe): Secondary | ICD-10-CM

## 2022-01-09 DIAGNOSIS — I701 Atherosclerosis of renal artery: Secondary | ICD-10-CM

## 2022-02-14 NOTE — Progress Notes (Signed)
 " Cardiology Office Note:    Date:  02/16/2022   ID:  JERROLD HASKELL, DOB [birth year], MRN [id number]  PCP:  Seabron Lenis, MD   [facility] Providers Cardiologist:  Victory LELON Claudene DOUGLAS, MD     Referring MD: Seabron Lenis, MD   No chief complaint on file.  History of Present Illness:    Dione KEEVAN WOLZ is a 88 y.o. male with a hx of CAD, CABG x 5, carotid artery disease, HTN, CKD IV (gfr 29), stroke, mesenteric artery stenosis (followed by [facility]) and prostate cancer.  Last seen in our office with Almarie Crate NP on 11/04/21. At that time he was stable from a cardiac perspective with occasional chronic DOE. Referral was made for f/u with nephrologist for CKD.  ED visit on 11/15/21 for laceration of his hand.   He presents today accompanied by his daughter and grandson for follow up of his HTN. He recently sustained a fall on 12/23, he was working in his shop and his left leg gave out. Later that same day, he fell going into the restroom, also relates to weakness in his left leg. He denies any dizziness, presyncope or syncope. He mobility has been relatively limited following the fall up until two days ago, and he is now able to ambulate with his cane per his baseline.   Referral was made at his last visit for nephrology, he will see them later this week for his initial appointment. His daughter checks his BP for him at home and states they are mostly normal, what they are here. His denies chest pain, palpitations, dyspnea, pnd, orthopnea, n, v, dizziness, syncope, edema, weight gain, or early satiety.    Past Medical History:  Diagnosis Date   Arthritis    Carotid arterial disease (HCC)    Chronic kidney disease (CKD), stage IV (severe) (HCC)    Diverticulosis    colonic diverticulosis   Gallstones    GERD (gastroesophageal reflux disease)    Hypertension    Myocardial infarction [facility])    Prostate cancer (HCC)    Status post coronary artery bypass grafting      intraoperative cholangiogram   Stroke [facility])    patient reports no deficits    Urinary incontinence    bladder leakage -  patient wears a pad    Past Surgical History:  Procedure Laterality Date   BACK SURGERY     x 4, has titanium in back per patient    CHOLECYSTECTOMY, LAPAROSCOPIC     CORONARY ARTERY BYPASS GRAFT  2001   CRYOTHERAPY     Prostate Cancer   LEFT HEART CATH AND CORS/GRAFTS ANGIOGRAPHY N/A 10/27/2017   Procedure: LEFT HEART CATH AND CORS/GRAFTS ANGIOGRAPHY;  Surgeon: Claudene Victory LELON, MD;  Location: [facility] INVASIVE CV LAB;  Service: Cardiovascular;  Laterality: N/A;   prostate cancer     ROTATOR CUFF REPAIR     LEFT   TOTAL KNEE ARTHROPLASTY Right 12/18/2014   Procedure: TOTAL RIGHT KNEE ARTHROPLASTY;  Surgeon: Donnice Car, MD;  Location: [facility] ORS;  Service: Orthopedics;  Laterality: Right;    Current Medications: Current Meds  Medication Sig   ALPRAZolam  (XANAX ) 0.25 MG tablet Take 0.25 mg by mouth at bedtime as needed for anxiety or sleep. TAKE ONE TABLET AT NIGHT   amitriptyline (ELAVIL) 10 MG tablet Take 20 mg by mouth in the morning and at bedtime.    amLODipine  (NORVASC ) 10 MG tablet TAKE ONE TABLET BY MOUTH EVERY MORNING  aspirin  EC 81 MG tablet Take 1 tablet (81 mg total) by mouth daily.   atorvastatin  (LIPITOR ) 80 MG tablet Take 80 mg by mouth every evening.    carvedilol  (COREG ) 12.5 MG tablet TAKE ONE TABLET BY MOUTH EVERY MORNING and TAKE ONE TABLET BY MOUTH EVERYDAY AT BEDTIME   hydrALAZINE  (APRESOLINE ) 25 MG tablet Take 1 tablet (25 mg total) by mouth 3 (three) times daily. To be taken with (50 mg) tablet three times daily.   hydrALAZINE  (APRESOLINE ) 50 MG tablet TAKE ONE TABLET BY MOUTH EVERY MORNING and TAKE ONE TABLET BY MOUTH AT NOON and TAKE ONE TABLET BY MOUTH EVERYDAY AT BEDTIME   isosorbide  mononitrate (IMDUR ) 60 MG 24 hr tablet TAKE ONE TABLET BY MOUTH EVERY MORNING   Melatonin 10 MG TABS Take 10 mg by mouth daily.   mirtazapine  (REMERON ) 15 MG  tablet Take 15 mg by mouth at bedtime.   nitroGLYCERIN  (NITROSTAT ) 0.4 MG SL tablet Dissolve 1 tab under tongue as needed for chest pain. May repeat every 5 minutes x 2 doses. If no relief call 9-1-1.   pantoprazole  (PROTONIX ) 40 MG tablet TAKE ONE TABLET BY MOUTH EVERY MORNING   tamsulosin  (FLOMAX ) 0.4 MG CAPS capsule Take 0.8 mg by mouth daily. Take two (2) capsules (0.8 mg) by mouth daily.    traMADol  (ULTRAM ) 50 MG tablet Take 50 mg by mouth every 6 (six) hours as needed for moderate pain or severe pain.   zolpidem  (AMBIEN ) 5 MG tablet Take 5 mg by mouth at bedtime.     Allergies:   Amlodipine  besylate, Mirabegron , and Vibegron   Social History   Socioeconomic History   Marital status: Widowed    Spouse name: Not on file   Number of children: Not on file   Years of education: Not on file   Highest education level: Not on file  Occupational History   Not on file  Tobacco Use   Smoking status: Never   Smokeless tobacco: Never  Vaping Use   Vaping Use: Never used  Substance and Sexual Activity   Alcohol use: No   Drug use: No   Sexual activity: Not Currently  Other Topics Concern   Not on file  Social History Narrative   Not on file   Social Determinants of Health   Financial Resource Strain: Not on file  Food Insecurity: Not on file  Transportation Needs: Not on file  Physical Activity: Not on file  Stress: Not on file  Social Connections: Not on file     Family History: The patient's family history includes Heart failure in his mother.  ROS:   Review of Systems  Constitutional: Negative.   HENT: Negative.    Eyes: Negative.   Respiratory:  Negative for cough, hemoptysis and shortness of breath.   Cardiovascular:  Negative for chest pain, palpitations, orthopnea, claudication, leg swelling and PND.  Gastrointestinal:  Negative for abdominal pain.  Genitourinary: Negative.   Musculoskeletal:  Positive for back pain. Negative for falls (x2).  Skin: Negative.    Neurological:  Positive for weakness (left leg on occasion). Negative for dizziness, loss of consciousness and headaches.  Endo/Heme/Allergies: Negative.   Psychiatric/Behavioral: Negative.       EKGs/Labs/Other Studies Reviewed:    The following studies were reviewed today:  09/2000 Carotid US  - Right & Left 1-39%  07/2019 Renal US  - per Dr. Darron Duplex was suggestive of less than 60% stenosis bilaterally. This is not hemodynamically significant. His blood pressure also improved  after adjusting his antihypertensive medications. No indication for renal artery angiography or revascularization. I recommend continuing medical therapy  EKG:  EKG is not ordered today.    Recent Labs: 12/26/2021: BUN 20; Creatinine, Ser 2.11; Potassium 4.7; Sodium 139  Recent Lipid Panel No results found for: CHOL, TRIG, HDL, CHOLHDL, VLDL, LDLCALC, LDLDIRECT   Risk Assessment/Calculations:                Physical Exam:    VS:  BP 134/72   Pulse 62   Ht 5' 8 (1.727 m)   Wt 192 lb 9.6 oz (87.4 kg)   SpO2 96%   BMI 29.28 kg/m     Wt Readings from Last 3 Encounters:  02/16/22 192 lb 9.6 oz (87.4 kg)  11/15/21 185 lb 6.4 oz (84.1 kg)  11/04/21 185 lb 6.4 oz (84.1 kg)     Physical Exam Constitutional:      General: He is not in acute distress.    Appearance: He is not ill-appearing.  Neck:     Vascular: No carotid bruit.  Cardiovascular:     Rate and Rhythm: Normal rate and regular rhythm.     Pulses: Normal pulses.     Heart sounds: Normal heart sounds. No murmur heard. Pulmonary:     Breath sounds: Normal breath sounds.  Abdominal:     General: There is no distension.     Tenderness: There is no abdominal tenderness.  Musculoskeletal:     Right lower leg: No edema.     Left lower leg: Edema (trace at sockline, baseline per patient) present.  Skin:    General: Skin is warm and dry.  Neurological:     Mental Status: He is alert. Mental status is at baseline.   Psychiatric:        Mood and Affect: Mood normal.     ASSESSMENT:    1. Coronary artery disease involving native coronary artery of native heart without angina pectoris   2. Bilateral carotid artery disease, unspecified type (HCC)   3. S/P CABG (coronary artery bypass graft)   4. Essential hypertension   5. RAS (renal artery stenosis) (HCC)   6. Fall as cause of accidental injury in home as place of occurrence, initial encounter    PLAN:    In order of problems listed above:  CAD s/p CABG x 5 - No angina or acute decompensation. 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 Continue imdur  60 mg daily, ASA 81 mg daily, carvedilol  12.5 mg twice daily.   Bilateral carotid artery disease - Carotid US  09/12/20, right and left 1-39%. Carotid US  for follow up of carotid artery disease--although they would likely defer any surgical intervention--they would like to keep an eye on this.  HTN, renal artery stenosis - BP today 134/72 and is currently controlled, continue Norvasc  10 mg daily, carvedilol  12.5 mg twice daily, hydralazine  75 mg three times per day.   HLD - LDL 61 on 10/23/21 , managed by PCP, continue atorvastatin  80 mg daily.  CKD IV - GFR 29 12/26/21, Pending referral to nephrology, will be seen in their office later this week.   Disposition - Repeat carotid US , patient would not want surgical intervention but he would like to know if his carotid stenosis is worsening. Follow up with Dr. Hobart ~ 1 year, sooner if needed.          Medication Adjustments/Labs and Tests Ordered: Current medicines are reviewed at length with the patient  today.  Concerns regarding medicines are outlined above.  Orders Placed This Encounter  Procedures   VAS US  CAROTID   No orders of the defined types were placed in this encounter.   Patient Instructions  Medication Instructions:  Your physician recommends that you continue on your current medications as directed. Please refer to the Current  Medication list given to you today.  *If you need a refill on your cardiac medications before your next appointment, please call your pharmacy*   Lab Work: None ordered  If you have labs (blood work) drawn today and your tests are completely normal, you will receive your results only by: MyChart Message (if you have MyChart) OR A paper copy in the mail If you have any lab test that is abnormal or we need to change your treatment, we will call you to review the results.   Testing/Procedures: Your physician has requested that you have a carotid duplex. This test is an ultrasound of the carotid arteries in your neck. It looks at blood flow through these arteries that supply the brain with blood. Allow one hour for this exam. There are no restrictions or special instructions.    Follow-Up: At [facility], you and your health needs are our priority.  As part of our continuing mission to provide you with exceptional heart care, we have created designated Provider Care Teams.  These Care Teams include your primary Cardiologist (physician) and Advanced Practice Providers (APPs -  Physician Assistants and Nurse Practitioners) who all work together to provide you with the care you need, when you need it.  We recommend signing up for the patient portal called MyChart.  Sign up information is provided on this After Visit Summary.  MyChart is used to connect with patients for Virtual Visits (Telemedicine).  Patients are able to view lab/test results, encounter notes, upcoming appointments, etc.  Non-urgent messages can be sent to your provider as well.   To learn more about what you can do with MyChart, go to [email or web address].    Your next appointment:   1 year(s)  The format for your next appointment:   In Person  Provider:   Powell Sorrow, MD  [place] transfer)   Other Instructions   Important Information About Sugar         Signed, Delon JAYSON Hoover, NP   02/16/2022 12:27 PM    [facility] "

## 2022-02-16 ENCOUNTER — Encounter: Payer: Self-pay | Admitting: Cardiology

## 2022-02-16 ENCOUNTER — Ambulatory Visit: Payer: Medicare Other | Attending: Physician Assistant | Admitting: Cardiology

## 2022-02-16 VITALS — BP 134/72 | HR 62 | Ht 68.0 in | Wt 192.6 lb

## 2022-02-16 DIAGNOSIS — I10 Essential (primary) hypertension: Secondary | ICD-10-CM

## 2022-02-16 DIAGNOSIS — I701 Atherosclerosis of renal artery: Secondary | ICD-10-CM | POA: Diagnosis not present

## 2022-02-16 DIAGNOSIS — Z951 Presence of aortocoronary bypass graft: Secondary | ICD-10-CM | POA: Diagnosis not present

## 2022-02-16 DIAGNOSIS — I779 Disorder of arteries and arterioles, unspecified: Secondary | ICD-10-CM

## 2022-02-16 DIAGNOSIS — I2510 Atherosclerotic heart disease of native coronary artery without angina pectoris: Secondary | ICD-10-CM | POA: Diagnosis not present

## 2022-02-16 DIAGNOSIS — W19XXXA Unspecified fall, initial encounter: Secondary | ICD-10-CM

## 2022-02-16 DIAGNOSIS — Y92009 Unspecified place in unspecified non-institutional (private) residence as the place of occurrence of the external cause: Secondary | ICD-10-CM | POA: Diagnosis not present

## 2022-02-16 NOTE — Patient Instructions (Addendum)
Medication Instructions:  Your physician recommends that you continue on your current medications as directed. Please refer to the Current Medication list given to you today.  *If you need a refill on your cardiac medications before your next appointment, please call your pharmacy*   Lab Work: None ordered  If you have labs (blood work) drawn today and your tests are completely normal, you will receive your results only by: [place] (if you have MyChart) OR A paper copy in the mail If you have any lab test that is abnormal or we need to change your treatment, we will call you to review the results.   Testing/Procedures: Your physician has requested that you have a carotid duplex. This test is an ultrasound of the carotid arteries in your neck. It looks at blood flow through these arteries that supply the brain with blood. Allow one hour for this exam. There are no restrictions or special instructions.    Follow-Up: At [facility], you and your health needs are our priority.  As part of our continuing mission to provide you with exceptional heart care, we have created designated Provider Care Teams.  These Care Teams include your primary Cardiologist (physician) and Advanced Practice Providers (APPs -  Physician Assistants and Nurse Practitioners) who all work together to provide you with the care you need, when you need it.  We recommend signing up for the patient portal called "MyChart".  Sign up information is provided on this After Visit Summary.  MyChart is used to connect with patients for Virtual Visits (Telemedicine).  Patients are able to view lab/test results, encounter notes, upcoming appointments, etc.  Non-urgent messages can be sent to your provider as well.   To learn more about what you can do with MyChart, go to [email or web address].    Your next appointment:   1 year(s)  The format for your next appointment:   In Person  Provider:   Gwyndolyn Kaufman, MD  [place])   Other Instructions   Important Information About Sugar

## 2022-02-17 ENCOUNTER — Other Ambulatory Visit: Payer: Self-pay | Admitting: Interventional Cardiology

## 2022-02-19 DIAGNOSIS — N189 Chronic kidney disease, unspecified: Secondary | ICD-10-CM | POA: Diagnosis not present

## 2022-02-19 DIAGNOSIS — I2510 Atherosclerotic heart disease of native coronary artery without angina pectoris: Secondary | ICD-10-CM | POA: Diagnosis not present

## 2022-02-19 DIAGNOSIS — N281 Cyst of kidney, acquired: Secondary | ICD-10-CM | POA: Diagnosis not present

## 2022-02-19 DIAGNOSIS — N184 Chronic kidney disease, stage 4 (severe): Secondary | ICD-10-CM | POA: Diagnosis not present

## 2022-02-19 DIAGNOSIS — I701 Atherosclerosis of renal artery: Secondary | ICD-10-CM | POA: Diagnosis not present

## 2022-02-19 DIAGNOSIS — N390 Urinary tract infection, site not specified: Secondary | ICD-10-CM | POA: Diagnosis not present

## 2022-02-19 DIAGNOSIS — R600 Localized edema: Secondary | ICD-10-CM | POA: Diagnosis not present

## 2022-02-19 DIAGNOSIS — I129 Hypertensive chronic kidney disease with stage 1 through stage 4 chronic kidney disease, or unspecified chronic kidney disease: Secondary | ICD-10-CM | POA: Diagnosis not present

## 2022-02-19 DIAGNOSIS — D631 Anemia in chronic kidney disease: Secondary | ICD-10-CM | POA: Diagnosis not present

## 2022-02-20 ENCOUNTER — Encounter (HOSPITAL_COMMUNITY): Payer: Medicare Other

## 2022-02-24 ENCOUNTER — Other Ambulatory Visit: Payer: Self-pay | Admitting: Nephrology

## 2022-02-24 DIAGNOSIS — N184 Chronic kidney disease, stage 4 (severe): Secondary | ICD-10-CM

## 2022-03-16 ENCOUNTER — Ambulatory Visit
Admission: RE | Admit: 2022-03-16 | Discharge: 2022-03-16 | Disposition: A | Discharge status: Home / Self Care | Payer: Medicare Other | Source: Ambulatory Visit | Attending: Nephrology | Admitting: Nephrology

## 2022-03-16 DIAGNOSIS — N184 Chronic kidney disease, stage 4 (severe): Secondary | ICD-10-CM

## 2022-03-16 DIAGNOSIS — N189 Chronic kidney disease, unspecified: Secondary | ICD-10-CM | POA: Diagnosis not present

## 2022-04-08 ENCOUNTER — Ambulatory Visit (HOSPITAL_COMMUNITY)
Admission: RE | Admit: 2022-04-08 | Discharge: 2022-04-08 | Disposition: A | Discharge status: Home / Self Care | Payer: Medicare Other | Source: Ambulatory Visit | Attending: Cardiology | Admitting: Cardiology

## 2022-04-08 DIAGNOSIS — I6523 Occlusion and stenosis of bilateral carotid arteries: Secondary | ICD-10-CM | POA: Diagnosis present

## 2022-04-08 DIAGNOSIS — I779 Disorder of arteries and arterioles, unspecified: Secondary | ICD-10-CM | POA: Diagnosis present

## 2022-04-28 IMAGING — DX DG CHEST 2V
2 series · 2 of 2 positions shown · non-contrast
Comparison: 11/08/2009

CLINICAL DATA: Wheezing and congestion and chest, history coronary
artery disease post MI and CABG, stroke, hypertension, stage IV
chronic kidney disease

EXAM:
CHEST - 2 VIEW

[dg chest 2 view (1 of 2)]
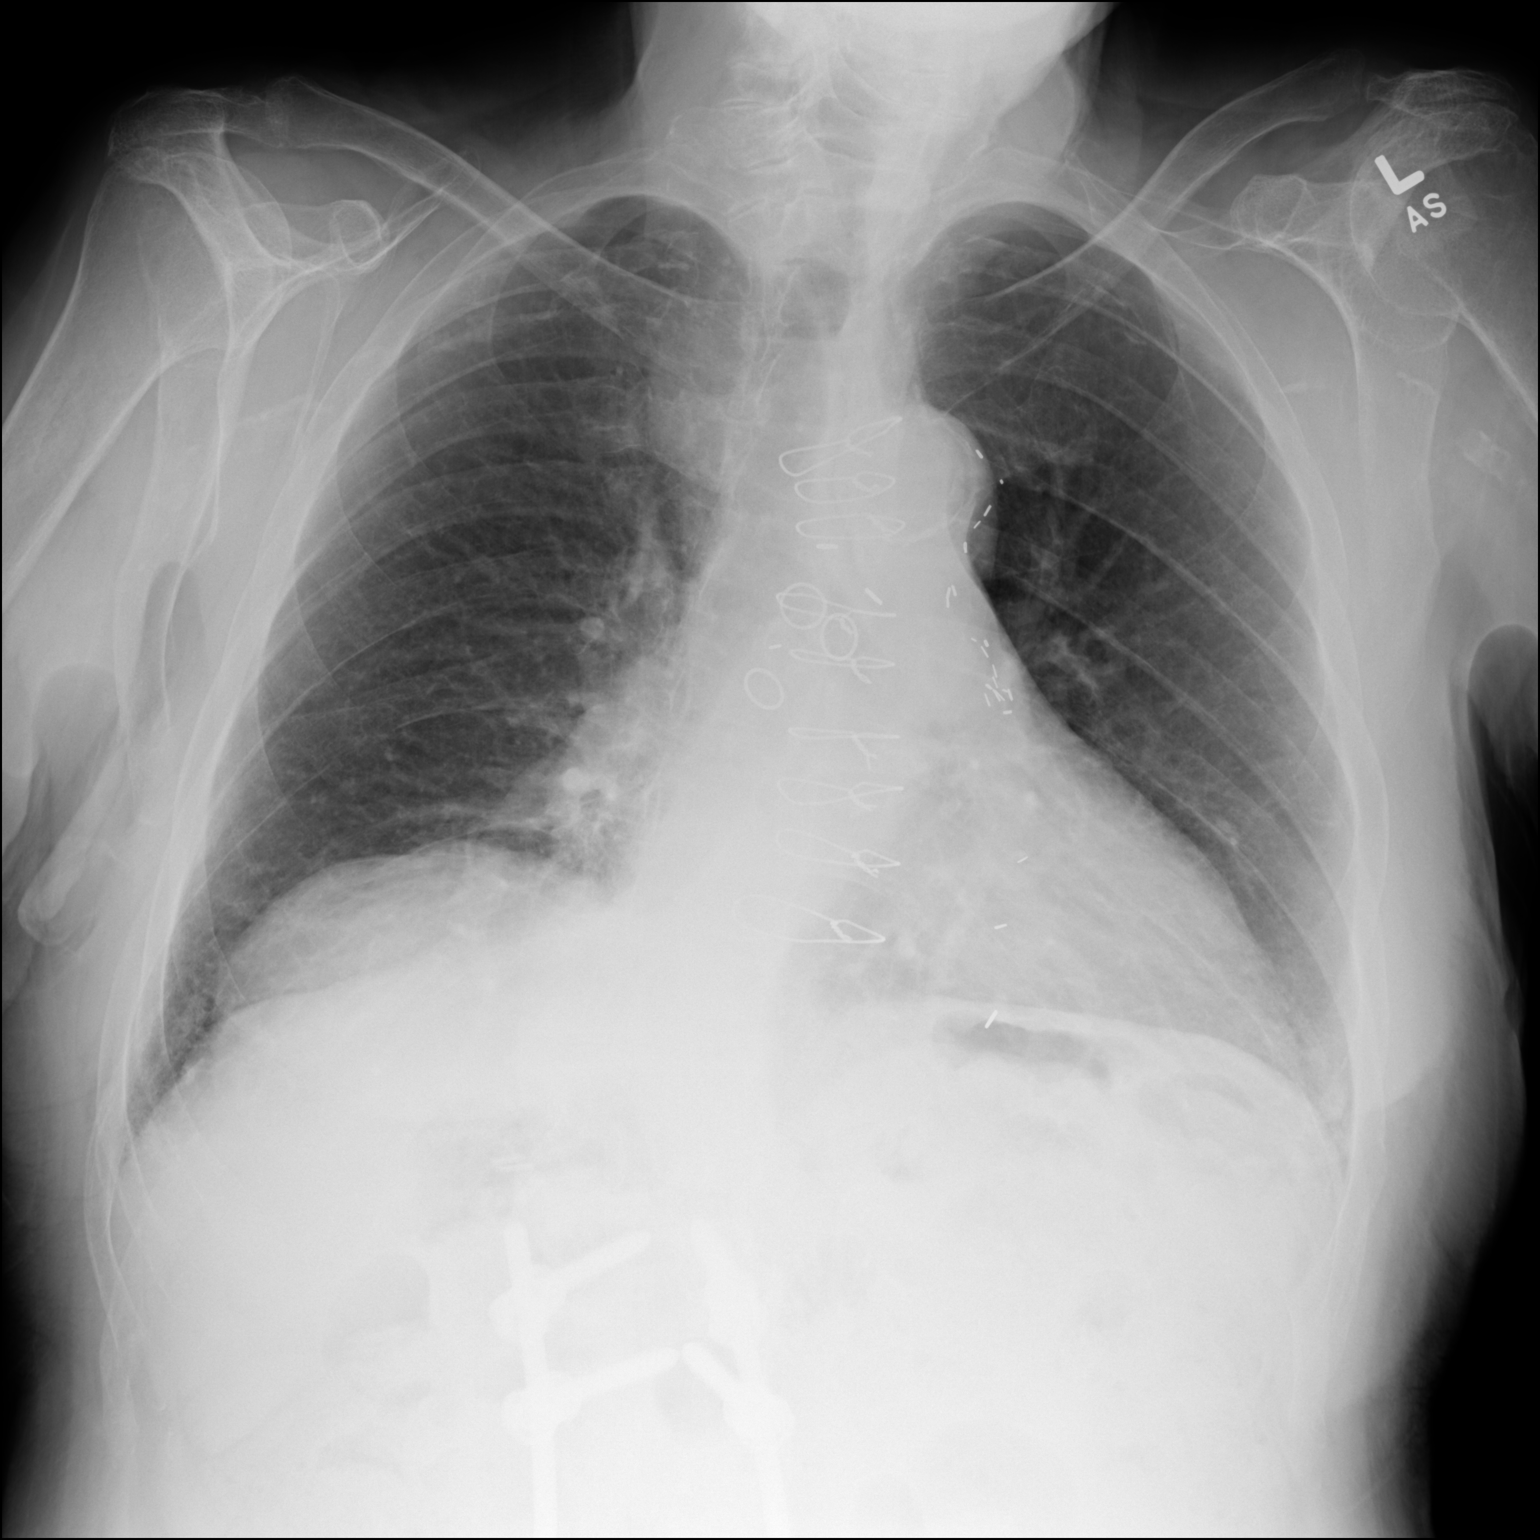

[dg chest 2 view (2 of 2)]
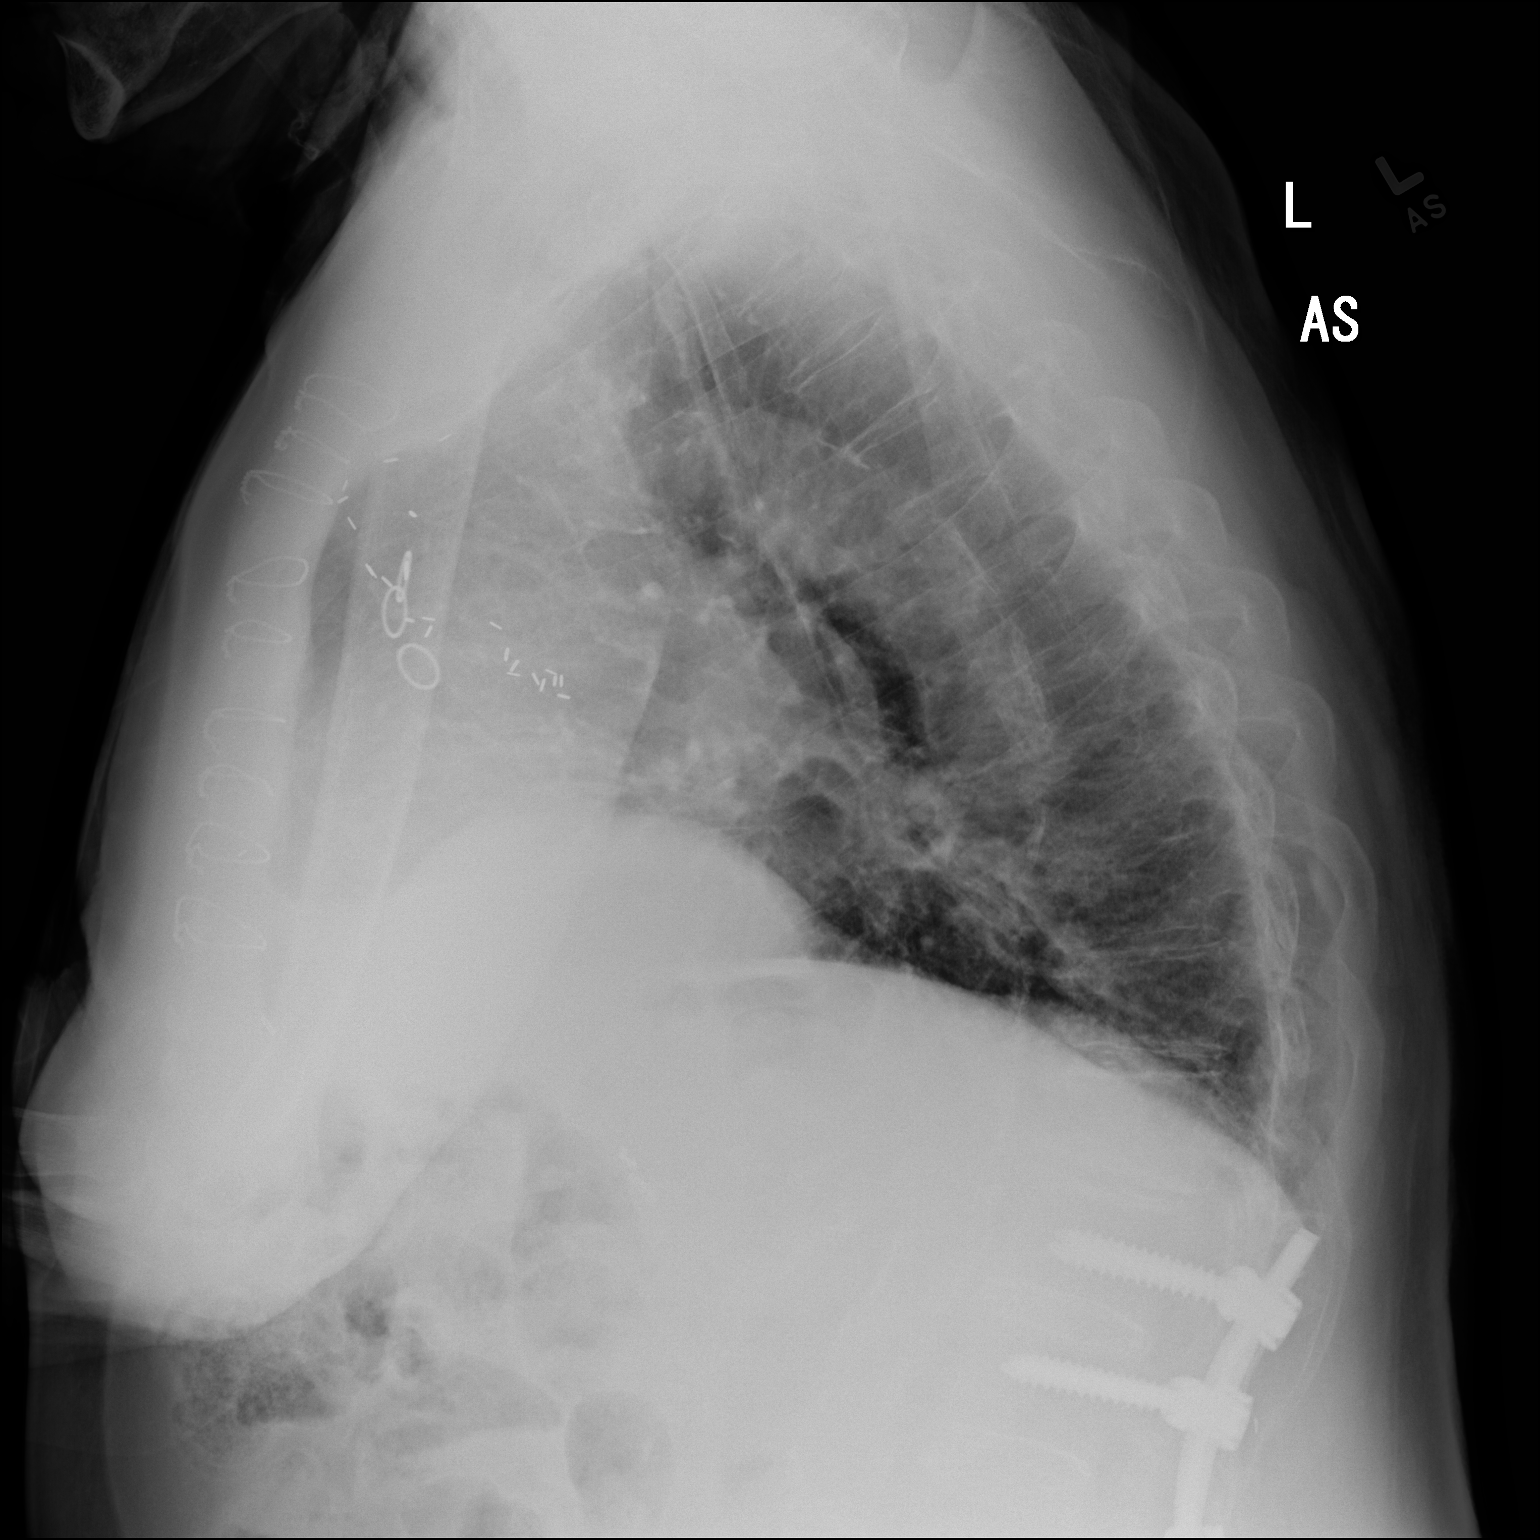

[2 of 2 positions shown; findings below may reference images not displayed]

FINDINGS: Borderline enlargement of cardiac silhouette post CABG.

Prominent RIGHT paratracheal soft tissues unchanged likely vascular
in origin, present since at least 05/31/2006.

Pulmonary vascularity normal.

Atherosclerotic calcification aorta.

Mild RIGHT basilar atelectasis.

No acute infiltrate, pleural effusion, or pneumothorax.

Osseous demineralization with severe dextroconvex scoliosis of
thoracolumbar spine and prior lumbar spinal fixation.
IMPRESSION: RIGHT basilar atelectasis.

Borderline enlargement of cardiac silhouette post CABG.

## 2022-04-30 DIAGNOSIS — D485 Neoplasm of uncertain behavior of skin: Secondary | ICD-10-CM | POA: Diagnosis not present

## 2022-04-30 DIAGNOSIS — C44329 Squamous cell carcinoma of skin of other parts of face: Secondary | ICD-10-CM | POA: Diagnosis not present

## 2022-04-30 DIAGNOSIS — C44629 Squamous cell carcinoma of skin of left upper limb, including shoulder: Secondary | ICD-10-CM | POA: Diagnosis not present

## 2022-04-30 DIAGNOSIS — Z85828 Personal history of other malignant neoplasm of skin: Secondary | ICD-10-CM | POA: Diagnosis not present

## 2022-04-30 DIAGNOSIS — Z08 Encounter for follow-up examination after completed treatment for malignant neoplasm: Secondary | ICD-10-CM | POA: Diagnosis not present

## 2022-04-30 DIAGNOSIS — L821 Other seborrheic keratosis: Secondary | ICD-10-CM | POA: Diagnosis not present

## 2022-04-30 DIAGNOSIS — L570 Actinic keratosis: Secondary | ICD-10-CM | POA: Diagnosis not present

## 2022-05-12 DIAGNOSIS — C44329 Squamous cell carcinoma of skin of other parts of face: Secondary | ICD-10-CM | POA: Diagnosis not present

## 2022-05-19 DIAGNOSIS — Z0000 Encounter for general adult medical examination without abnormal findings: Secondary | ICD-10-CM | POA: Diagnosis not present

## 2022-05-19 DIAGNOSIS — E782 Mixed hyperlipidemia: Secondary | ICD-10-CM | POA: Diagnosis not present

## 2022-05-19 DIAGNOSIS — N184 Chronic kidney disease, stage 4 (severe): Secondary | ICD-10-CM | POA: Diagnosis not present

## 2022-05-19 DIAGNOSIS — G4700 Insomnia, unspecified: Secondary | ICD-10-CM | POA: Diagnosis not present

## 2022-05-19 DIAGNOSIS — R0989 Other specified symptoms and signs involving the circulatory and respiratory systems: Secondary | ICD-10-CM | POA: Diagnosis not present

## 2022-05-19 DIAGNOSIS — Z23 Encounter for immunization: Secondary | ICD-10-CM | POA: Diagnosis not present

## 2022-05-19 DIAGNOSIS — I25111 Atherosclerotic heart disease of native coronary artery with angina pectoris with documented spasm: Secondary | ICD-10-CM | POA: Diagnosis not present

## 2022-05-19 DIAGNOSIS — I129 Hypertensive chronic kidney disease with stage 1 through stage 4 chronic kidney disease, or unspecified chronic kidney disease: Secondary | ICD-10-CM | POA: Diagnosis not present

## 2022-05-28 DIAGNOSIS — N3281 Overactive bladder: Secondary | ICD-10-CM | POA: Diagnosis not present

## 2022-06-01 DIAGNOSIS — L905 Scar conditions and fibrosis of skin: Secondary | ICD-10-CM | POA: Diagnosis not present

## 2022-06-01 DIAGNOSIS — C44629 Squamous cell carcinoma of skin of left upper limb, including shoulder: Secondary | ICD-10-CM | POA: Diagnosis not present

## 2022-06-04 ENCOUNTER — Other Ambulatory Visit (HOSPITAL_COMMUNITY): Payer: Self-pay | Admitting: Urology

## 2022-06-04 DIAGNOSIS — R9721 Rising PSA following treatment for malignant neoplasm of prostate: Secondary | ICD-10-CM

## 2022-06-09 ENCOUNTER — Other Ambulatory Visit (HOSPITAL_COMMUNITY): Payer: Self-pay | Admitting: Urology

## 2022-06-09 DIAGNOSIS — R9721 Rising PSA following treatment for malignant neoplasm of prostate: Secondary | ICD-10-CM

## 2022-06-15 DIAGNOSIS — C44629 Squamous cell carcinoma of skin of left upper limb, including shoulder: Secondary | ICD-10-CM | POA: Diagnosis not present

## 2022-06-15 DIAGNOSIS — L570 Actinic keratosis: Secondary | ICD-10-CM | POA: Diagnosis not present

## 2022-06-15 DIAGNOSIS — L989 Disorder of the skin and subcutaneous tissue, unspecified: Secondary | ICD-10-CM | POA: Diagnosis not present

## 2022-06-15 DIAGNOSIS — D485 Neoplasm of uncertain behavior of skin: Secondary | ICD-10-CM | POA: Diagnosis not present

## 2022-06-19 DIAGNOSIS — N184 Chronic kidney disease, stage 4 (severe): Secondary | ICD-10-CM | POA: Diagnosis not present

## 2022-06-23 ENCOUNTER — Ambulatory Visit (HOSPITAL_COMMUNITY)
Admission: RE | Admit: 2022-06-23 | Discharge: 2022-06-23 | Disposition: A | Discharge status: Home / Self Care | Payer: Medicare Other | Source: Ambulatory Visit | Attending: Urology | Admitting: Urology

## 2022-06-23 DIAGNOSIS — R9721 Rising PSA following treatment for malignant neoplasm of prostate: Secondary | ICD-10-CM | POA: Insufficient documentation

## 2022-06-23 MED ORDER — PIFLIFOLASTAT F 18 (PYLARIFY) INJECTION
9.0000 | Freq: Once | INTRAVENOUS | Status: AC
  Administered 2022-06-23: 8.94 via INTRAVENOUS

## 2022-06-24 DIAGNOSIS — R600 Localized edema: Secondary | ICD-10-CM | POA: Diagnosis not present

## 2022-06-24 DIAGNOSIS — N184 Chronic kidney disease, stage 4 (severe): Secondary | ICD-10-CM | POA: Diagnosis not present

## 2022-06-24 DIAGNOSIS — N281 Cyst of kidney, acquired: Secondary | ICD-10-CM | POA: Diagnosis not present

## 2022-06-24 DIAGNOSIS — N2581 Secondary hyperparathyroidism of renal origin: Secondary | ICD-10-CM | POA: Diagnosis not present

## 2022-06-24 DIAGNOSIS — I129 Hypertensive chronic kidney disease with stage 1 through stage 4 chronic kidney disease, or unspecified chronic kidney disease: Secondary | ICD-10-CM | POA: Diagnosis not present

## 2022-06-24 DIAGNOSIS — E559 Vitamin D deficiency, unspecified: Secondary | ICD-10-CM | POA: Diagnosis not present

## 2022-06-24 DIAGNOSIS — I701 Atherosclerosis of renal artery: Secondary | ICD-10-CM | POA: Diagnosis not present

## 2022-06-24 DIAGNOSIS — D631 Anemia in chronic kidney disease: Secondary | ICD-10-CM | POA: Diagnosis not present

## 2022-06-29 DIAGNOSIS — L570 Actinic keratosis: Secondary | ICD-10-CM | POA: Diagnosis not present

## 2022-07-07 NOTE — Telephone Encounter (Signed)
Will reach out to patient for follow up.

## 2022-07-07 NOTE — Telephone Encounter (Signed)
Will reach out to patient for follow up.

## 2022-08-05 ENCOUNTER — Emergency Department (HOSPITAL_COMMUNITY): Payer: Medicare Other

## 2022-08-05 ENCOUNTER — Emergency Department (HOSPITAL_COMMUNITY)
Admission: EM | Admit: 2022-08-05 | Discharge: 2022-08-05 | Disposition: A | Discharge status: Home / Self Care | Payer: Medicare Other | Attending: Emergency Medicine | Admitting: Emergency Medicine

## 2022-08-05 ENCOUNTER — Other Ambulatory Visit: Payer: Self-pay

## 2022-08-05 DIAGNOSIS — T148XXA Other injury of unspecified body region, initial encounter: Secondary | ICD-10-CM

## 2022-08-05 DIAGNOSIS — S199XXA Unspecified injury of neck, initial encounter: Secondary | ICD-10-CM | POA: Diagnosis not present

## 2022-08-05 DIAGNOSIS — W19XXXA Unspecified fall, initial encounter: Secondary | ICD-10-CM | POA: Diagnosis not present

## 2022-08-05 DIAGNOSIS — I10 Essential (primary) hypertension: Secondary | ICD-10-CM | POA: Insufficient documentation

## 2022-08-05 DIAGNOSIS — Z951 Presence of aortocoronary bypass graft: Secondary | ICD-10-CM | POA: Diagnosis not present

## 2022-08-05 DIAGNOSIS — S299XXA Unspecified injury of thorax, initial encounter: Secondary | ICD-10-CM | POA: Diagnosis not present

## 2022-08-05 DIAGNOSIS — M4312 Spondylolisthesis, cervical region: Secondary | ICD-10-CM | POA: Insufficient documentation

## 2022-08-05 DIAGNOSIS — M47812 Spondylosis without myelopathy or radiculopathy, cervical region: Secondary | ICD-10-CM | POA: Diagnosis not present

## 2022-08-05 DIAGNOSIS — W1830XA Fall on same level, unspecified, initial encounter: Secondary | ICD-10-CM | POA: Diagnosis not present

## 2022-08-05 DIAGNOSIS — Z8546 Personal history of malignant neoplasm of prostate: Secondary | ICD-10-CM | POA: Insufficient documentation

## 2022-08-05 DIAGNOSIS — I2510 Atherosclerotic heart disease of native coronary artery without angina pectoris: Secondary | ICD-10-CM | POA: Insufficient documentation

## 2022-08-05 DIAGNOSIS — J984 Other disorders of lung: Secondary | ICD-10-CM | POA: Diagnosis not present

## 2022-08-05 DIAGNOSIS — I6782 Cerebral ischemia: Secondary | ICD-10-CM | POA: Insufficient documentation

## 2022-08-05 DIAGNOSIS — S0081XA Abrasion of other part of head, initial encounter: Secondary | ICD-10-CM | POA: Insufficient documentation

## 2022-08-05 DIAGNOSIS — D649 Anemia, unspecified: Secondary | ICD-10-CM

## 2022-08-05 DIAGNOSIS — Z7982 Long term (current) use of aspirin: Secondary | ICD-10-CM | POA: Diagnosis not present

## 2022-08-05 DIAGNOSIS — R918 Other nonspecific abnormal finding of lung field: Secondary | ICD-10-CM | POA: Diagnosis not present

## 2022-08-05 DIAGNOSIS — R079 Chest pain, unspecified: Secondary | ICD-10-CM | POA: Diagnosis not present

## 2022-08-05 DIAGNOSIS — M4802 Spinal stenosis, cervical region: Secondary | ICD-10-CM | POA: Diagnosis not present

## 2022-08-05 DIAGNOSIS — R0789 Other chest pain: Secondary | ICD-10-CM | POA: Diagnosis not present

## 2022-08-05 DIAGNOSIS — S0990XA Unspecified injury of head, initial encounter: Secondary | ICD-10-CM | POA: Diagnosis not present

## 2022-08-05 DIAGNOSIS — Z79899 Other long term (current) drug therapy: Secondary | ICD-10-CM | POA: Diagnosis not present

## 2022-08-05 LAB — CBC WITH DIFFERENTIAL/PLATELET
Abs Immature Granulocytes: 0.03 10*3/uL (ref 0.00–0.07)
Basophils Absolute: 0.1 10*3/uL (ref 0.0–0.1)
Basophils Relative: 1 %
Eosinophils Absolute: 0.9 10*3/uL — ABNORMAL HIGH (ref 0.0–0.5)
Eosinophils Relative: 9 %
HCT: 32.4 % — ABNORMAL LOW (ref 39.0–52.0)
Hemoglobin: 10.2 g/dL — ABNORMAL LOW (ref 13.0–17.0)
Immature Granulocytes: 0 %
Lymphocytes Relative: 16 %
Lymphs Abs: 1.8 10*3/uL (ref 0.7–4.0)
MCH: 29.6 pg (ref 26.0–34.0)
MCHC: 31.5 g/dL (ref 30.0–36.0)
MCV: 93.9 fL (ref 80.0–100.0)
Monocytes Absolute: 1 10*3/uL (ref 0.1–1.0)
Monocytes Relative: 9 %
Neutro Abs: 7.1 10*3/uL (ref 1.7–7.7)
Neutrophils Relative %: 65 %
Platelets: 233 10*3/uL (ref 150–400)
RBC: 3.45 MIL/uL — ABNORMAL LOW (ref 4.22–5.81)
RDW: 14.8 % (ref 11.5–15.5)
WBC: 10.7 10*3/uL — ABNORMAL HIGH (ref 4.0–10.5)
nRBC: 0 % (ref 0.0–0.2)

## 2022-08-05 LAB — COMPREHENSIVE METABOLIC PANEL
ALT: 11 U/L (ref 0–44)
AST: 21 U/L (ref 15–41)
Albumin: 2.8 g/dL — ABNORMAL LOW (ref 3.5–5.0)
Alkaline Phosphatase: 50 U/L (ref 38–126)
Anion gap: 8 (ref 5–15)
BUN: 21 mg/dL (ref 8–23)
CO2: 17 mmol/L — ABNORMAL LOW (ref 22–32)
Calcium: 7 mg/dL — ABNORMAL LOW (ref 8.9–10.3)
Chloride: 113 mmol/L — ABNORMAL HIGH (ref 98–111)
Creatinine, Ser: 1.89 mg/dL — ABNORMAL HIGH (ref 0.61–1.24)
GFR, Estimated: 33 mL/min — ABNORMAL LOW (ref >60)
Glucose, Bld: 86 mg/dL (ref 70–99)
Potassium: 3.5 mmol/L (ref 3.5–5.1)
Sodium: 138 mmol/L (ref 135–145)
Total Bilirubin: 0.6 mg/dL (ref 0.3–1.2)
Total Protein: 4.9 g/dL — ABNORMAL LOW (ref 6.5–8.1)

## 2022-08-05 LAB — TROPONIN I (HIGH SENSITIVITY): Troponin I (High Sensitivity): 17 ng/L (ref <18)

## 2022-08-05 MED ORDER — LACTATED RINGERS IV BOLUS
500.0000 mL | Freq: Once | INTRAVENOUS | Status: AC
  Administered 2022-08-05: 500 mL via INTRAVENOUS

## 2022-08-05 NOTE — ED Provider Notes (Signed)
[place] EMERGENCY DEPARTMENT AT [facility] Provider Note   CSN: [id number] Arrival date & time: 08/05/22  1031     History  Chief Complaint  Patient presents with   Fall    Trip and fall this morning. Abrasion above left eyebrow. Pt reports +LOC. Denies head or neck pain. Reports left sided chest pain and shoulder pain when with inspiration. CABG hx. No thinners. GCS 15 BGL 116.     Brandon Harris is a 89 y.o. male.  Patient is a 89 year old male with a past medical history of CAD status post CABG, prostate cancer, hypertension presenting to the emergency department with chest pain after a fall.  The patient states around 5 AM he went up to go to the bathroom and next thing he knew he had fallen on the ground.  He states that after he fell he started to notice that he was having some left-sided chest pain that was worse with taking deep breaths.  He states he does not know if he hit the left side of his chest but did hit his head.  He did have loss of consciousness.  His family member was able to help him get up and get back into bed however he states after resting he is continuing to have chest pain which prompted him to come to the emergency department.  He states that he did take a nitro prior to arrival with some improvement.  He denies any shortness of breath or cough.  He denies any lightheadedness or dizziness prior to the fall.  He denies any other pain or injuries from the fall.  He states that he has been feeling generally unwell for the last few days with a decreased appetite but denies any nausea, vomiting or diarrhea, fevers or chills.  He denies any blood thinner use.  The history is provided by the patient and a relative.  Fall       Home Medications Prior to Admission medications   Medication Sig Start Date End Date Taking? Authorizing Provider  ALPRAZolam Prudy Feeler) 0.25 MG tablet Take 0.25 mg by mouth at bedtime as needed for anxiety or sleep. TAKE ONE TABLET  AT NIGHT 02/13/13   [provider]  amitriptyline (ELAVIL) 10 MG tablet Take 20 mg by mouth in the morning and at bedtime.  08/08/17   [provider]  amLODipine (NORVASC) 10 MG tablet TAKE ONE TABLET BY MOUTH EVERY MORNING 11/19/21   Lyn Records, MD  aspirin EC 81 MG tablet Take 1 tablet (81 mg total) by mouth daily. 06/03/16   Lyn Records, MD  atorvastatin (LIPITOR) 80 MG tablet Take 80 mg by mouth every evening.     [provider]  carvedilol (COREG) 12.5 MG tablet TAKE ONE TABLET BY MOUTH EVERY MORNING and TAKE ONE TABLET BY MOUTH EVERYDAY AT BEDTIME 11/19/21   Lyn Records, MD  hydrALAZINE (APRESOLINE) 25 MG tablet Take 1 tablet (25 mg total) by mouth 3 (three) times daily. To be taken with (50 mg) tablet three times daily. 11/19/21   Lyn Records, MD  hydrALAZINE (APRESOLINE) 50 MG tablet TAKE ONE TABLET BY MOUTH EVERY MORNING and TAKE ONE TABLET BY MOUTH AT NOON and TAKE ONE TABLET BY MOUTH EVERYDAY AT BEDTIME 11/19/21   Lyn Records, MD  isosorbide mononitrate (IMDUR) 60 MG 24 hr tablet TAKE ONE TABLET BY MOUTH EVERY MORNING 11/19/21   Lyn Records, MD  Melatonin 10 MG TABS Take 10  mg by mouth daily.    [provider]  mirtazapine (REMERON) 15 MG tablet Take 15 mg by mouth at bedtime. 11/09/19   [provider]  nitroGLYCERIN (NITROSTAT) 0.4 MG SL tablet Dissolve 1 tab under tongue as needed for chest pain. May repeat every 5 minutes x 2 doses. If no relief call 9-1-1. 02/17/22   Lyn Records, MD  pantoprazole (PROTONIX) 40 MG tablet TAKE ONE TABLET BY MOUTH EVERY MORNING 06/01/19   Lyn Records, MD  tamsulosin (FLOMAX) 0.4 MG CAPS capsule Take 0.8 mg by mouth daily. Take two (2) capsules (0.8 mg) by mouth daily.  08/08/17   [provider]  traMADol (ULTRAM) 50 MG tablet Take 50 mg by mouth every 6 (six) hours as needed for moderate pain or severe pain. 06/07/20   [provider]  zolpidem (AMBIEN) 5 MG tablet Take 5  mg by mouth at bedtime. 09/01/21   [provider]      Allergies    Amlodipine besylate, Mirabegron, and Vibegron    Review of Systems   Review of Systems  Physical Exam Updated Vital Signs BP (!) 161/63   Pulse (!) 54   Temp 98.1 F (36.7 C) (Oral)   Resp 20   Ht 5\' 11"  (1.803 m)   Wt 81.6 kg   SpO2 100%   BMI 25.10 kg/m  Physical Exam Vitals and nursing note reviewed.  Constitutional:      General: He is not in acute distress.    Appearance: Normal appearance.  HENT:     Head: Normocephalic.     Comments: Non-bleeding Abrasion with small hematoma to L forehead    Nose: Nose normal.     Mouth/Throat:     Mouth: Mucous membranes are moist.     Pharynx: Oropharynx is clear.  Eyes:     Extraocular Movements: Extraocular movements intact.     Conjunctiva/sclera: Conjunctivae normal.  Neck:     Comments: No midline neck tenderness Cardiovascular:     Rate and Rhythm: Normal rate and regular rhythm.     Heart sounds: Normal heart sounds.     Comments: No chest wall tenderness to palpation Pulmonary:     Effort: Pulmonary effort is normal.     Breath sounds: Normal breath sounds.  Abdominal:     General: Abdomen is flat.     Palpations: Abdomen is soft.     Tenderness: There is no abdominal tenderness.  Musculoskeletal:        General: Normal range of motion.     Cervical back: Normal range of motion and neck supple.     Comments: No midline back tenderness No bony tenderness to bilateral UE and LE Pelvis stable, non-tender  Skin:    General: Skin is warm and dry.  Neurological:     General: No focal deficit present.     Mental Status: He is alert and oriented to person, place, and time.     Cranial Nerves: No cranial nerve deficit.     Sensory: No sensory deficit.     Motor: No weakness.  Psychiatric:        Mood and Affect: Mood normal.        Behavior: Behavior normal.     ED Results / Procedures / Treatments   Labs (all labs ordered are  listed, but only abnormal results are displayed) Labs Reviewed  COMPREHENSIVE METABOLIC PANEL - Abnormal; Notable for the following components:      Result  Value   Chloride 113 (*)    CO2 17 (*)    Creatinine, Ser 1.89 (*)    Calcium 7.0 (*)    Total Protein 4.9 (*)    Albumin 2.8 (*)    GFR, Estimated 33 (*)    All other components within normal limits  CBC WITH DIFFERENTIAL/PLATELET - Abnormal; Notable for the following components:   WBC 10.7 (*)    RBC 3.45 (*)    Hemoglobin 10.2 (*)    HCT 32.4 (*)    Eosinophils Absolute 0.9 (*)    All other components within normal limits  URINALYSIS, ROUTINE W REFLEX MICROSCOPIC  TROPONIN I (HIGH SENSITIVITY)    EKG EKG Interpretation  Date/Time:  Wednesday August 05 2022 10:39:59 EDT Ventricular Rate:  63 PR Interval:  171 QRS Duration: 115 QT Interval:  392 QTC Calculation: 402 R Axis:   5 Text Interpretation: Sinus rhythm Incomplete right bundle branch block Low voltage, precordial leads Consider anterior infarct No significant change since last tracing Confirmed by Elayne Snare ([id number]) on 08/05/2022 10:41:50 AM  Radiology CT Head Wo Contrast  Result Date: 08/05/2022 CLINICAL DATA:  Provided history: Head trauma, minor. Neck trauma. Fall. EXAM: CT HEAD WITHOUT CONTRAST CT CERVICAL SPINE WITHOUT CONTRAST TECHNIQUE: Multidetector CT imaging of the head and cervical spine was performed following the standard protocol without intravenous contrast. Multiplanar CT image reconstructions of the cervical spine were also generated. RADIATION DOSE REDUCTION: This exam was performed according to the departmental dose-optimization program which includes automated exposure control, adjustment of the mA and/or kV according to patient size and/or use of iterative reconstruction technique. COMPARISON:  Head CT 12/06/2017. FINDINGS: CT HEAD FINDINGS Brain: Generalized cerebral atrophy. Chronic lacunar infarcts in the bilateral deep gray nuclei,  progressed in number from the prior head CT of 12/06/2017. Background moderate patchy and ill-defined hypoattenuation within the cerebral white matter, nonspecific but compatible chronic small vessel ischemic disease. Redemonstrated small chronic infarcts within the bilateral cerebellar hemispheres. There is no acute intracranial hemorrhage. No demarcated cortical infarct. No extra-axial fluid collection. No evidence of an intracranial mass. No midline shift. Vascular: No hyperdense vessel.  Atherosclerotic calcifications. Skull: No fracture or aggressive osseous lesion. Sinuses/Orbits: No mass or acute finding within the imaged orbits. Trace mucosal thickening within the bilateral maxillary sinuses. Opacification of a posterior left ethmoid air cell. Minimal mucosal thickening scattered elsewhere within the bilateral ethmoid sinuses. Other: Left periorbital hematoma. CT CERVICAL SPINE FINDINGS Alignment: Dextrocurvature of the cervical spine. 2 mm grade 1 anterolisthesis at C2-C3. 2 mm grade 1 anterolisthesis at C3-C4 and C4-C5. 2 mm grade 1 anterolisthesis at C6-C7 and C7-T1. Skull base and vertebrae: The basion-dental and atlanto-dental intervals are maintained.No evidence of acute fracture to the cervical spine. Soft tissues and spinal canal: No prevertebral fluid or swelling. No visible canal hematoma. Disc levels: Cervical spondylosis with multilevel disc space narrowing, disc bulges/central disc protrusions, uncovertebral hypertrophy and facet arthrosis. Disc space narrowing is greatest at C3-C4 and C4-C5 (moderate at these levels). Multilevel spinal canal narrowing. Most notably, there is suspected moderate spinal canal stenosis at C3-C4 and C4-C5. Multilevel bony neural foraminal narrowing. Upper chest: No consolidation within the imaged lung apices. No visible pneumothorax. IMPRESSION: CT head: 1.  No evidence of an acute intracranial abnormality. 2. Left periorbital hematoma. 3. Parenchymal atrophy,  chronic small ischemic disease and chronic infarcts as described. 4. Paranasal sinus disease as outlined. CT cervical spine: 1. No evidence of an acute cervical spine fracture. 2. Mild grade  1 spondylolisthesis at C2-C3, C3-C4, C4-C5, C6-C7 and C7-T1. 3. Dextrocurvature of the cervical spine. 4. Cervical spondylosis as described. Electronically Signed   By: Jackey Loge D.O.   On: 08/05/2022 12:45   CT Cervical Spine Wo Contrast  Result Date: 08/05/2022 CLINICAL DATA:  Provided history: Head trauma, minor. Neck trauma. Fall. EXAM: CT HEAD WITHOUT CONTRAST CT CERVICAL SPINE WITHOUT CONTRAST TECHNIQUE: Multidetector CT imaging of the head and cervical spine was performed following the standard protocol without intravenous contrast. Multiplanar CT image reconstructions of the cervical spine were also generated. RADIATION DOSE REDUCTION: This exam was performed according to the departmental dose-optimization program which includes automated exposure control, adjustment of the mA and/or kV according to patient size and/or use of iterative reconstruction technique. COMPARISON:  Head CT 12/06/2017. FINDINGS: CT HEAD FINDINGS Brain: Generalized cerebral atrophy. Chronic lacunar infarcts in the bilateral deep gray nuclei, progressed in number from the prior head CT of 12/06/2017. Background moderate patchy and ill-defined hypoattenuation within the cerebral white matter, nonspecific but compatible chronic small vessel ischemic disease. Redemonstrated small chronic infarcts within the bilateral cerebellar hemispheres. There is no acute intracranial hemorrhage. No demarcated cortical infarct. No extra-axial fluid collection. No evidence of an intracranial mass. No midline shift. Vascular: No hyperdense vessel.  Atherosclerotic calcifications. Skull: No fracture or aggressive osseous lesion. Sinuses/Orbits: No mass or acute finding within the imaged orbits. Trace mucosal thickening within the bilateral maxillary sinuses.  Opacification of a posterior left ethmoid air cell. Minimal mucosal thickening scattered elsewhere within the bilateral ethmoid sinuses. Other: Left periorbital hematoma. CT CERVICAL SPINE FINDINGS Alignment: Dextrocurvature of the cervical spine. 2 mm grade 1 anterolisthesis at C2-C3. 2 mm grade 1 anterolisthesis at C3-C4 and C4-C5. 2 mm grade 1 anterolisthesis at C6-C7 and C7-T1. Skull base and vertebrae: The basion-dental and atlanto-dental intervals are maintained.No evidence of acute fracture to the cervical spine. Soft tissues and spinal canal: No prevertebral fluid or swelling. No visible canal hematoma. Disc levels: Cervical spondylosis with multilevel disc space narrowing, disc bulges/central disc protrusions, uncovertebral hypertrophy and facet arthrosis. Disc space narrowing is greatest at C3-C4 and C4-C5 (moderate at these levels). Multilevel spinal canal narrowing. Most notably, there is suspected moderate spinal canal stenosis at C3-C4 and C4-C5. Multilevel bony neural foraminal narrowing. Upper chest: No consolidation within the imaged lung apices. No visible pneumothorax. IMPRESSION: CT head: 1.  No evidence of an acute intracranial abnormality. 2. Left periorbital hematoma. 3. Parenchymal atrophy, chronic small ischemic disease and chronic infarcts as described. 4. Paranasal sinus disease as outlined. CT cervical spine: 1. No evidence of an acute cervical spine fracture. 2. Mild grade 1 spondylolisthesis at C2-C3, C3-C4, C4-C5, C6-C7 and C7-T1. 3. Dextrocurvature of the cervical spine. 4. Cervical spondylosis as described. Electronically Signed   By: Jackey Loge D.O.   On: 08/05/2022 12:45   CT Chest Wo Contrast  Result Date: 08/05/2022 CLINICAL DATA:  Chest trauma after fall. EXAM: CT CHEST WITHOUT CONTRAST TECHNIQUE: Multidetector CT imaging of the chest was performed following the standard protocol without IV contrast. RADIATION DOSE REDUCTION: This exam was performed according to the  departmental dose-optimization program which includes automated exposure control, adjustment of the mA and/or kV according to patient size and/or use of iterative reconstruction technique. COMPARISON:  May 22, 2021. FINDINGS: Cardiovascular: Status post coronary artery bypass graft. Atherosclerosis of thoracic aorta is noted without aneurysm formation. Mild cardiomegaly. No pericardial effusion. Mediastinum/Nodes: No enlarged mediastinal or axillary lymph nodes. Thyroid gland, trachea, and esophagus demonstrate no significant  findings. Lungs/Pleura: No pneumothorax or pleural effusion is noted. Stable reticular densities are noted in both lung bases most consistent with scarring or other chronic interstitial lung disease. No acute abnormality is noted. Upper Abdomen: No acute abnormality. Musculoskeletal: No chest wall mass or suspicious bone lesions identified. IMPRESSION: No definite traumatic injury seen in the chest. Status post coronary artery bypass graft. Stable reticular densities are noted in both lung bases most consistent with scarring or other chronic interstitial lung disease. Aortic Atherosclerosis (ICD10-I70.0). Electronically Signed   By: Lupita Raider M.D.   On: 08/05/2022 12:29    Procedures Procedures    Medications Ordered in ED Medications  lactated ringers bolus 500 mL (500 mLs Intravenous New Bag/Given 08/05/22 1332)    ED Course/ Medical Decision Making/ A&P Clinical Course as of 08/05/22 1341  Wed Aug 05, 2022  1253 No acute traumatic injury on CT imaging. [VK]  1304 Mildly low bicarb, Cr at baseline. Mild hypocalcemia, corrected to 8.0 for low albumin. Initial troponin is negative. Mild anemia from baseline. [VK]  1330 Chest pain resolved. Symptoms started around 5AM so single troponin is sufficient. Patient declined hemoccult testing today and reports anemia is being followed by PCP. He would prefer discharge home and was given strict return precautions. [VK]     Clinical Course User Index [VK] Rexford Maus, DO                             Medical Decision Making This patient presents to the ED with chief complaint(s) of fall, chest pain with pertinent past medical history of CAD s/p CABG, HTN, prostate cancer which further complicates the presenting complaint. The complaint involves an extensive differential diagnosis and also carries with it a high risk of complications and morbidity.    The differential diagnosis includes ICH, mass effect, cervical spine fracture, rib fracture, pneumothorax, hemothorax, pulmonary contusion, ACS, arrhythmia, anemia, pulmonary edema, pleural effusion, dehydration, electrolyte abnormality, pneumonia, infection  Additional history obtained: Additional history obtained from family Records reviewed outpatient cardiology records  ED Course and Reassessment: Patient's arrival to the emergency department he was hemodynamically stable in no acute distress.  EKG on arrival showed normal sinus rhythm without acute ischemic changes.  The patient does have evidence of head trauma and will have a CT head, C-spine and CT chest to evaluate for acute traumatic injuries.  He will additionally have labs including troponin to evaluate for cause of his chest pain and fall today and he will be closely reassessed.  He declined any pain medication at this time.  Independent labs interpretation:  The following labs were independently interpreted: Mild anemia and mildly low bicarb, labs otherwise at baseline  Independent visualization of imaging: - I independently visualized the following imaging with scope of interpretation limited to determining acute life threatening conditions related to emergency care: CT Head/C-spine/thorax, which revealed no acute traumatic injury  Consultation: - Consulted or discussed management/test interpretation w/ external professional: N/A  Consideration for admission or further workup: Patient has  no emergent conditions requiring admission or further work-up at this time and is stable for discharge home with primary care follow-up  Social Determinants of health: N/A    Amount and/or Complexity of Data Reviewed Labs: ordered. Radiology: ordered.          Final Clinical Impression(s) / ED Diagnoses Final diagnoses:  Fall, initial encounter  Abrasion  Chest pain, unspecified type  Anemia, unspecified type  Rx / DC Orders ED Discharge Orders     None         Rexford Maus, DO 08/05/22 1341

## 2022-08-05 NOTE — Discharge Instructions (Signed)
You were seen in the emergency department after your fall.  Your workup showed no signs of broken bones, bleeding in your brain or injury to your lungs.  Your workup also showed no signs of heart attack or stress on your heart.  It is unclear what is causing your chest pain at this time but you can take Tylenol as needed for pain and use ice pack to help with the swelling on your forehead.  You should follow-up with your primary doctor to have your symptoms rechecked as well as your hemoglobin rechecked as you were mildly anemic here.  You should return to the emergency department if you or having worsening chest pain, severe shortness of breath, you pass out or if you have any other new or concerning symptoms.

## 2022-09-08 DIAGNOSIS — M1712 Unilateral primary osteoarthritis, left knee: Secondary | ICD-10-CM | POA: Diagnosis not present

## 2022-09-08 DIAGNOSIS — Z96651 Presence of right artificial knee joint: Secondary | ICD-10-CM | POA: Diagnosis not present

## 2022-10-05 DIAGNOSIS — M1712 Unilateral primary osteoarthritis, left knee: Secondary | ICD-10-CM | POA: Insufficient documentation

## 2022-10-07 DIAGNOSIS — M1712 Unilateral primary osteoarthritis, left knee: Secondary | ICD-10-CM | POA: Diagnosis not present

## 2022-10-14 DIAGNOSIS — M1712 Unilateral primary osteoarthritis, left knee: Secondary | ICD-10-CM | POA: Diagnosis not present

## 2022-10-21 DIAGNOSIS — M1712 Unilateral primary osteoarthritis, left knee: Secondary | ICD-10-CM | POA: Diagnosis not present

## 2022-10-26 ENCOUNTER — Telehealth: Payer: Self-pay | Admitting: Cardiovascular Disease

## 2022-10-26 MED ORDER — AMLODIPINE BESYLATE 10 MG PO TABS: 10.0000 mg | ORAL_TABLET | Freq: Every morning | ORAL | 1 refills | Status: DC

## 2022-10-26 MED ORDER — CARVEDILOL 12.5 MG PO TABS: ORAL_TABLET | ORAL | 1 refills | Status: DC

## 2022-10-26 MED ORDER — HYDRALAZINE HCL 25 MG PO TABS: 25.0000 mg | ORAL_TABLET | Freq: Three times a day (TID) | ORAL | 1 refills | Status: DC

## 2022-10-26 MED ORDER — HYDRALAZINE HCL 50 MG PO TABS: ORAL_TABLET | ORAL | 1 refills | Status: DC

## 2022-10-26 MED ORDER — ISOSORBIDE MONONITRATE ER 60 MG PO TB24: 60.0000 mg | ORAL_TABLET | Freq: Every morning | ORAL | 1 refills | Status: DC

## 2022-10-26 NOTE — Telephone Encounter (Signed)
Pt's medications were sent to pt's pharmacy as requested. Confirmation received.

## 2022-10-26 NOTE — Telephone Encounter (Signed)
*  STAT* If patient is at the pharmacy, call can be transferred to refill team.   1. Which medications need to be refilled? (please list name of each medication and dose if known) amLODipine (NORVASC) 10 MG tablet  carvedilol (COREG) 12.5 MG tablet  hydrALAZINE (APRESOLINE) 25 MG tablet  hydrALAZINE (APRESOLINE) 50 MG tablet   isosorbide mononitrate (IMDUR) 60 MG 24 hr tablet   2. Which pharmacy/location (including street and city if local pharmacy) is medication to be sent to? [facility] - [address], [place] - [address]    3. Do they need a 30 day or 90 day supply? 30 day

## 2022-11-02 DIAGNOSIS — H53001 Unspecified amblyopia, right eye: Secondary | ICD-10-CM | POA: Diagnosis not present

## 2022-11-02 DIAGNOSIS — Z961 Presence of intraocular lens: Secondary | ICD-10-CM | POA: Diagnosis not present

## 2022-11-02 DIAGNOSIS — H52203 Unspecified astigmatism, bilateral: Secondary | ICD-10-CM | POA: Diagnosis not present

## 2022-11-20 DIAGNOSIS — E782 Mixed hyperlipidemia: Secondary | ICD-10-CM | POA: Diagnosis not present

## 2022-11-20 DIAGNOSIS — D649 Anemia, unspecified: Secondary | ICD-10-CM | POA: Diagnosis not present

## 2022-11-20 DIAGNOSIS — N184 Chronic kidney disease, stage 4 (severe): Secondary | ICD-10-CM | POA: Diagnosis not present

## 2022-11-20 DIAGNOSIS — I25111 Atherosclerotic heart disease of native coronary artery with angina pectoris with documented spasm: Secondary | ICD-10-CM | POA: Diagnosis not present

## 2022-11-20 DIAGNOSIS — N3281 Overactive bladder: Secondary | ICD-10-CM | POA: Diagnosis not present

## 2022-11-20 DIAGNOSIS — K219 Gastro-esophageal reflux disease without esophagitis: Secondary | ICD-10-CM | POA: Diagnosis not present

## 2022-11-20 DIAGNOSIS — Z23 Encounter for immunization: Secondary | ICD-10-CM | POA: Diagnosis not present

## 2022-11-20 DIAGNOSIS — I129 Hypertensive chronic kidney disease with stage 1 through stage 4 chronic kidney disease, or unspecified chronic kidney disease: Secondary | ICD-10-CM | POA: Diagnosis not present

## 2022-12-01 DIAGNOSIS — C7951 Secondary malignant neoplasm of bone: Secondary | ICD-10-CM | POA: Diagnosis not present

## 2022-12-01 DIAGNOSIS — N3281 Overactive bladder: Secondary | ICD-10-CM | POA: Diagnosis not present

## 2022-12-17 DIAGNOSIS — N184 Chronic kidney disease, stage 4 (severe): Secondary | ICD-10-CM | POA: Diagnosis not present

## 2022-12-25 DIAGNOSIS — R600 Localized edema: Secondary | ICD-10-CM | POA: Diagnosis not present

## 2022-12-25 DIAGNOSIS — E559 Vitamin D deficiency, unspecified: Secondary | ICD-10-CM | POA: Diagnosis not present

## 2022-12-25 DIAGNOSIS — I129 Hypertensive chronic kidney disease with stage 1 through stage 4 chronic kidney disease, or unspecified chronic kidney disease: Secondary | ICD-10-CM | POA: Diagnosis not present

## 2022-12-25 DIAGNOSIS — D631 Anemia in chronic kidney disease: Secondary | ICD-10-CM | POA: Diagnosis not present

## 2022-12-25 DIAGNOSIS — N184 Chronic kidney disease, stage 4 (severe): Secondary | ICD-10-CM | POA: Diagnosis not present

## 2022-12-25 DIAGNOSIS — N2581 Secondary hyperparathyroidism of renal origin: Secondary | ICD-10-CM | POA: Diagnosis not present

## 2022-12-25 DIAGNOSIS — N281 Cyst of kidney, acquired: Secondary | ICD-10-CM | POA: Diagnosis not present

## 2023-01-11 DIAGNOSIS — C7951 Secondary malignant neoplasm of bone: Secondary | ICD-10-CM | POA: Diagnosis not present

## 2023-01-13 DIAGNOSIS — C44329 Squamous cell carcinoma of skin of other parts of face: Secondary | ICD-10-CM | POA: Diagnosis not present

## 2023-01-13 DIAGNOSIS — D485 Neoplasm of uncertain behavior of skin: Secondary | ICD-10-CM | POA: Diagnosis not present

## 2023-01-14 ENCOUNTER — Other Ambulatory Visit: Payer: Self-pay | Admitting: Nurse Practitioner

## 2023-01-14 DIAGNOSIS — R9721 Rising PSA following treatment for malignant neoplasm of prostate: Secondary | ICD-10-CM

## 2023-01-26 DIAGNOSIS — C44329 Squamous cell carcinoma of skin of other parts of face: Secondary | ICD-10-CM | POA: Diagnosis not present

## 2023-01-28 ENCOUNTER — Other Ambulatory Visit: Payer: Self-pay

## 2023-01-28 MED ORDER — ISOSORBIDE MONONITRATE ER 60 MG PO TB24: 60.0000 mg | ORAL_TABLET | Freq: Every morning | ORAL | 0 refills | Status: DC

## 2023-01-28 MED ORDER — HYDRALAZINE HCL 25 MG PO TABS: 25.0000 mg | ORAL_TABLET | Freq: Three times a day (TID) | ORAL | 0 refills | Status: DC

## 2023-01-28 MED ORDER — HYDRALAZINE HCL 50 MG PO TABS: ORAL_TABLET | ORAL | 0 refills | Status: DC

## 2023-02-02 ENCOUNTER — Other Ambulatory Visit: Payer: Self-pay

## 2023-02-02 MED ORDER — AMLODIPINE BESYLATE 10 MG PO TABS: 10.0000 mg | ORAL_TABLET | Freq: Every morning | ORAL | 0 refills | Status: DC

## 2023-02-02 MED ORDER — CARVEDILOL 12.5 MG PO TABS: ORAL_TABLET | ORAL | 0 refills | Status: DC

## 2023-02-15 ENCOUNTER — Encounter: Payer: Self-pay | Admitting: Cardiovascular Disease

## 2023-02-15 NOTE — Progress Notes (Signed)
  Cardiology Office Note:  .   Date:  02/16/2023  ID:  Brandon Harris, DOB 23-Oct-1928, MRN [id number] PCP: Brandon Lenis, MD  [facility] Providers Cardiologist:  Brandon FORBES Sorrow, MD (Inactive)    History of Present Illness: .    Jan. 7, 2025  Seen with daughter, Brandon Harris is a 89 y.o. male previous patient of Dr. Claudene / Harris. I am meeting him for the first time today  Hx of CAD, CABG ( 2001) , carotid artery disease , HTN, renal artery stenosis , HLD  CKD  No angina   Has prostate cancer ( sees Brandon Harris )  with mets to several of his ribs   Hx of back surgery .  Has a titanium rod up most of his back Able to ride his 4 wheeler, mows 5 acres  In the past, has raised horsed, cows, goats, chickens   Has Hyperlipidemia: He had labs recently with Dr. Jeralyn.  His last LDL is 64, HDL is 33, total cholesterol is 123, triglyceride level is 145.    ROS:   Studies Reviewed: .         Risk Assessment/Calculations:             Physical Exam:   VS:  BP 136/80   Pulse 65   Ht 5' 11 (1.803 m)   Wt 193 lb 6.4 oz (87.7 kg)   SpO2 97%   BMI 26.97 kg/m    Wt Readings from Last 3 Encounters:  02/16/23 193 lb 6.4 oz (87.7 kg)  08/05/22 180 lb (81.6 kg)  02/16/22 192 lb 9.6 oz (87.4 kg)    GEN: Well nourished, well developed in no acute distress NECK: No JVD; No carotid bruits CARDIAC: RRR, , soft systolic murmur  RESPIRATORY:  Clear to auscultation without rales, wheezing or rhonchi  ABDOMEN: Soft, non-tender, non-distended EXTREMITIES:  No edema; No deformity   ASSESSMENT AND PLAN: .      CAD , s/p CABG.  No angina.    2.  Hyperlipidemia: He had labs recently with Dr. Jeralyn.  His last LDL is 64, HDL is 33, total cholesterol is 123, triglyceride level is 145.  Cont current meds   Will have him see Dr. Pietro in 1 year         Dispo: 1 year    Signed, Brandon Passe, MD

## 2023-02-16 ENCOUNTER — Ambulatory Visit: Payer: Medicare Other | Attending: Cardiovascular Disease | Admitting: Cardiovascular Disease

## 2023-02-16 ENCOUNTER — Encounter: Payer: Self-pay | Admitting: Cardiovascular Disease

## 2023-02-16 VITALS — BP 136/80 | HR 65 | Ht 71.0 in | Wt 193.4 lb

## 2023-02-16 DIAGNOSIS — I6523 Occlusion and stenosis of bilateral carotid arteries: Secondary | ICD-10-CM | POA: Diagnosis not present

## 2023-02-16 DIAGNOSIS — I25709 Atherosclerosis of coronary artery bypass graft(s), unspecified, with unspecified angina pectoris: Secondary | ICD-10-CM

## 2023-02-16 NOTE — Patient Instructions (Signed)
 Medication Instructions:  Your physician recommends that you continue on your current medications as directed. Please refer to the Current Medication list given to you today.  *If you need a refill on your cardiac medications before your next appointment, please call your pharmacy*  Lab Work: None ordered today.  Testing/Procedures: None ordered today.  Follow-Up: At [facility], you and your health needs are our priority.  As part of our continuing mission to provide you with exceptional heart care, we have created designated Provider Care Teams.  These Care Teams include your primary Cardiologist (physician) and Advanced Practice Providers (APPs -  Physician Assistants and Nurse Practitioners) who all work together to provide you with the care you need, when you need it.  Your next appointment:   1 year(s)  The format for your next appointment:   In Person  Provider:   Redell Shallow, MD

## 2023-03-09 ENCOUNTER — Other Ambulatory Visit: Payer: Self-pay

## 2023-03-09 ENCOUNTER — Emergency Department (HOSPITAL_COMMUNITY): Payer: Medicare Other

## 2023-03-09 ENCOUNTER — Emergency Department (HOSPITAL_COMMUNITY)
Admission: EM | Admit: 2023-03-09 | Discharge: 2023-03-09 | Disposition: A | Discharge status: Home / Self Care | Payer: Medicare Other | Attending: Emergency Medicine | Admitting: Emergency Medicine

## 2023-03-09 DIAGNOSIS — R112 Nausea with vomiting, unspecified: Secondary | ICD-10-CM | POA: Diagnosis not present

## 2023-03-09 DIAGNOSIS — K5730 Diverticulosis of large intestine without perforation or abscess without bleeding: Secondary | ICD-10-CM | POA: Insufficient documentation

## 2023-03-09 DIAGNOSIS — Z9049 Acquired absence of other specified parts of digestive tract: Secondary | ICD-10-CM | POA: Insufficient documentation

## 2023-03-09 DIAGNOSIS — R918 Other nonspecific abnormal finding of lung field: Secondary | ICD-10-CM | POA: Diagnosis not present

## 2023-03-09 DIAGNOSIS — R1084 Generalized abdominal pain: Secondary | ICD-10-CM | POA: Diagnosis not present

## 2023-03-09 DIAGNOSIS — K439 Ventral hernia without obstruction or gangrene: Secondary | ICD-10-CM | POA: Insufficient documentation

## 2023-03-09 DIAGNOSIS — R100 Acute abdomen: Secondary | ICD-10-CM | POA: Diagnosis not present

## 2023-03-09 DIAGNOSIS — I700 Atherosclerosis of aorta: Secondary | ICD-10-CM | POA: Diagnosis not present

## 2023-03-09 DIAGNOSIS — N390 Urinary tract infection, site not specified: Secondary | ICD-10-CM | POA: Diagnosis not present

## 2023-03-09 DIAGNOSIS — B9689 Other specified bacterial agents as the cause of diseases classified elsewhere: Secondary | ICD-10-CM | POA: Insufficient documentation

## 2023-03-09 DIAGNOSIS — K5900 Constipation, unspecified: Secondary | ICD-10-CM | POA: Diagnosis not present

## 2023-03-09 DIAGNOSIS — Z951 Presence of aortocoronary bypass graft: Secondary | ICD-10-CM | POA: Insufficient documentation

## 2023-03-09 DIAGNOSIS — R1011 Right upper quadrant pain: Secondary | ICD-10-CM | POA: Diagnosis not present

## 2023-03-09 DIAGNOSIS — I2510 Atherosclerotic heart disease of native coronary artery without angina pectoris: Secondary | ICD-10-CM | POA: Diagnosis not present

## 2023-03-09 DIAGNOSIS — Z20822 Contact with and (suspected) exposure to covid-19: Secondary | ICD-10-CM | POA: Insufficient documentation

## 2023-03-09 DIAGNOSIS — Z8546 Personal history of malignant neoplasm of prostate: Secondary | ICD-10-CM | POA: Insufficient documentation

## 2023-03-09 DIAGNOSIS — R059 Cough, unspecified: Secondary | ICD-10-CM | POA: Diagnosis not present

## 2023-03-09 DIAGNOSIS — Z7982 Long term (current) use of aspirin: Secondary | ICD-10-CM | POA: Insufficient documentation

## 2023-03-09 DIAGNOSIS — R079 Chest pain, unspecified: Secondary | ICD-10-CM | POA: Insufficient documentation

## 2023-03-09 DIAGNOSIS — R1010 Upper abdominal pain, unspecified: Secondary | ICD-10-CM | POA: Insufficient documentation

## 2023-03-09 DIAGNOSIS — I10 Essential (primary) hypertension: Secondary | ICD-10-CM | POA: Diagnosis not present

## 2023-03-09 LAB — URINALYSIS, ROUTINE W REFLEX MICROSCOPIC
Bilirubin Urine: NEGATIVE
Glucose, UA: NEGATIVE mg/dL
Hgb urine dipstick: NEGATIVE
Ketones, ur: NEGATIVE mg/dL
Nitrite: NEGATIVE
Protein, ur: 100 mg/dL — AB
Specific Gravity, Urine: 1.013 (ref 1.005–1.030)
WBC, UA: >50 WBC/hpf (ref 0–5)
pH: 6 (ref 5.0–8.0)

## 2023-03-09 LAB — CBC WITH DIFFERENTIAL/PLATELET
Abs Immature Granulocytes: 0.05 10*3/uL (ref 0.00–0.07)
Basophils Absolute: 0 10*3/uL (ref 0.0–0.1)
Basophils Relative: 0 %
Eosinophils Absolute: 0.1 10*3/uL (ref 0.0–0.5)
Eosinophils Relative: 1 %
HCT: 28.9 % — ABNORMAL LOW (ref 39.0–52.0)
Hemoglobin: 9.3 g/dL — ABNORMAL LOW (ref 13.0–17.0)
Immature Granulocytes: 0 %
Lymphocytes Relative: 14 %
Lymphs Abs: 1.8 10*3/uL (ref 0.7–4.0)
MCH: 30 pg (ref 26.0–34.0)
MCHC: 32.2 g/dL (ref 30.0–36.0)
MCV: 93.2 fL (ref 80.0–100.0)
Monocytes Absolute: 1.4 10*3/uL — ABNORMAL HIGH (ref 0.1–1.0)
Monocytes Relative: 11 %
Neutro Abs: 9.2 10*3/uL — ABNORMAL HIGH (ref 1.7–7.7)
Neutrophils Relative %: 74 %
Platelets: 268 10*3/uL (ref 150–400)
RBC: 3.1 MIL/uL — ABNORMAL LOW (ref 4.22–5.81)
RDW: 14.1 % (ref 11.5–15.5)
WBC: 12.5 10*3/uL — ABNORMAL HIGH (ref 4.0–10.5)
nRBC: 0 % (ref 0.0–0.2)

## 2023-03-09 LAB — COMPREHENSIVE METABOLIC PANEL
ALT: 22 U/L (ref 0–44)
AST: 22 U/L (ref 15–41)
Albumin: 3.8 g/dL (ref 3.5–5.0)
Alkaline Phosphatase: 65 U/L (ref 38–126)
Anion gap: 11 (ref 5–15)
BUN: 27 mg/dL — ABNORMAL HIGH (ref 8–23)
CO2: 25 mmol/L (ref 22–32)
Calcium: 9.1 mg/dL (ref 8.9–10.3)
Chloride: 96 mmol/L — ABNORMAL LOW (ref 98–111)
Creatinine, Ser: 2.17 mg/dL — ABNORMAL HIGH (ref 0.61–1.24)
GFR, Estimated: 28 mL/min — ABNORMAL LOW (ref >60)
Glucose, Bld: 134 mg/dL — ABNORMAL HIGH (ref 70–99)
Potassium: 3.5 mmol/L (ref 3.5–5.1)
Sodium: 132 mmol/L — ABNORMAL LOW (ref 135–145)
Total Bilirubin: 0.8 mg/dL (ref 0.0–1.2)
Total Protein: 6.9 g/dL (ref 6.5–8.1)

## 2023-03-09 LAB — RESP PANEL BY RT-PCR (RSV, FLU A&B, COVID)  RVPGX2
Influenza A by PCR: NEGATIVE
Influenza B by PCR: NEGATIVE
Resp Syncytial Virus by PCR: NEGATIVE
SARS Coronavirus 2 by RT PCR: NEGATIVE

## 2023-03-09 LAB — LIPASE, BLOOD: Lipase: 51 U/L (ref 11–51)

## 2023-03-09 LAB — TROPONIN I (HIGH SENSITIVITY): Troponin I (High Sensitivity): 17 ng/L (ref <18)

## 2023-03-09 LAB — POC OCCULT BLOOD, ED: Fecal Occult Bld: NEGATIVE

## 2023-03-09 MED ORDER — ONDANSETRON HCL 4 MG/2ML IJ SOLN
4.0000 mg | Freq: Once | INTRAMUSCULAR | Status: AC
  Administered 2023-03-09: 4 mg via INTRAVENOUS
  Filled 2023-03-09: qty 2

## 2023-03-09 MED ORDER — AMOXICILLIN-POT CLAVULANATE 875-125 MG PO TABS: 1.0000 | ORAL_TABLET | Freq: Two times a day (BID) | ORAL | 0 refills | Status: DC

## 2023-03-09 MED ORDER — SODIUM CHLORIDE 0.9 % IV BOLUS
500.0000 mL | Freq: Once | INTRAVENOUS | Status: AC
  Administered 2023-03-09: 500 mL via INTRAVENOUS

## 2023-03-09 MED ORDER — ONDANSETRON HCL 4 MG PO TABS: 4.0000 mg | ORAL_TABLET | Freq: Three times a day (TID) | ORAL | 0 refills | Status: DC | PRN

## 2023-03-09 MED ORDER — PANTOPRAZOLE SODIUM 40 MG IV SOLR
40.0000 mg | Freq: Once | INTRAVENOUS | Status: AC
  Administered 2023-03-09: 40 mg via INTRAVENOUS
  Filled 2023-03-09: qty 10

## 2023-03-09 MED ORDER — FENTANYL CITRATE PF 50 MCG/ML IJ SOSY
50.0000 ug | PREFILLED_SYRINGE | Freq: Once | INTRAMUSCULAR | Status: AC
  Administered 2023-03-09: 50 ug via INTRAVENOUS
  Filled 2023-03-09: qty 1

## 2023-03-09 NOTE — ED Triage Notes (Signed)
Pt BIB Ems c/o abd x2 days. States it got worst last night. States feeling discomfort when he swallows which causes his abd pain. Pain 10/10. Vomited twice last night. Denies Sob unsure if having CP. No fever, chills. Pt  doesn't have gallbladder but yes appendix.  BPP 158/98, Hr 68, Spo2 97%, Temp 97.6 CBG 96

## 2023-03-09 NOTE — ED Notes (Signed)
Pt was able to tolerate water without incident

## 2023-03-09 NOTE — Discharge Instructions (Addendum)
You were seen in the emergency department for nausea and vomiting upper abdominal pain.  You had blood work and a CAT scan that did not show a clear explanation for your symptoms.  Your CAT scan showed some rectal thickening.  Your urinalysis showed possible infection.  We are sending you home with prescriptions for nausea and an antibiotic.  Start with a clear liquid diet advance as tolerated.  Follow-up with your primary care doctor.  Return to the emergency department if any worsening or concerning symptoms

## 2023-03-09 NOTE — ED Provider Notes (Signed)
[facility] AT [facility] Provider Note   CSN: [id number] Arrival date & time: 03/09/23  2130     History  Chief Complaint  Patient presents with   Abdominal Pain    Brandon Harris is a 89 y.o. male.  He is brought in by ambulance from home.  Daughter is assisting with history.  He has had URI symptoms for the last few days with cough productive of clear sputum and runny nose.  Starting 2 days ago he had pain in his upper abdomen that is gradually worsened, radiates into chest, 8 out of 10 intensity, associated with 2 episodes of vomiting.  Last emesis was brown.  He denies any diarrhea or constipation, no urinary symptoms no fevers or chills.  Has been using some Mucinex without any improvement.  Prior history of prostate cancer and cholecystectomy.  Also has coronary disease.  The history is provided by the patient and a relative.  Abdominal Pain Pain location:  Epigastric Pain quality: aching   Pain radiates to:  Chest Pain severity:  Severe Onset quality:  Gradual Duration:  2 days Timing:  Constant Progression:  Worsening Chronicity:  New Relieved by:  Nothing Worsened by:  Nothing Associated symptoms: chest pain, cough, nausea and vomiting   Associated symptoms: no chills, no constipation, no diarrhea, no dysuria, no fever and no shortness of breath        Home Medications Prior to Admission medications   Medication Sig Start Date End Date Taking? Authorizing Provider  ALPRAZolam Prudy Feeler) 0.25 MG tablet Take 0.25 mg by mouth at bedtime as needed for anxiety or sleep. TAKE ONE TABLET AT NIGHT 02/13/13   [provider]  amitriptyline (ELAVIL) 10 MG tablet Take 20 mg by mouth in the morning and at bedtime.  08/08/17   [provider]  amLODipine (NORVASC) 10 MG tablet Take 1 tablet (10 mg total) by mouth every morning. 02/02/23   Nahser, Deloris Ping, MD  aspirin EC 81 MG tablet Take 1 tablet (81 mg total) by mouth daily. Patient  not taking: Reported on 02/16/2023 06/03/16   Lyn Records, MD  atorvastatin (LIPITOR) 80 MG tablet Take 80 mg by mouth every evening.     [provider]  carvedilol (COREG) 12.5 MG tablet Take one tablet by mouth every morning and take one tablet by mouth everyday at bedtime 02/02/23   Nahser, Deloris Ping, MD  furosemide (LASIX) 20 MG tablet TAKE 1 TABLET BY MOUTH WEEKLY ON MONDAY, [facility] & FRIDAY    [provider]  hydrALAZINE (APRESOLINE) 25 MG tablet Take 1 tablet (25 mg total) by mouth 3 (three) times daily. To be taken with (50 mg) tablet three times daily. 01/28/23   Nahser, Deloris Ping, MD  hydrALAZINE (APRESOLINE) 50 MG tablet Take one tablet by mouth every morning and take one tablet by mouth at noon and take one tablet by mouth everyday at bedtime. 01/28/23   Nahser, Deloris Ping, MD  isosorbide mononitrate (IMDUR) 60 MG 24 hr tablet Take 1 tablet (60 mg total) by mouth every morning. 01/28/23   Nahser, Deloris Ping, MD  Melatonin 10 MG TABS Take 10 mg by mouth daily.    [provider]  mirtazapine (REMERON) 15 MG tablet Take 15 mg by mouth at bedtime. 11/09/19   [provider]  nitroGLYCERIN (NITROSTAT) 0.4 MG SL tablet Dissolve 1 tab under tongue as needed for chest pain. May repeat every 5 minutes x 2 doses. If  no relief call 9-1-1. 02/17/22   Lyn Records, MD  pantoprazole (PROTONIX) 40 MG tablet TAKE ONE TABLET BY MOUTH EVERY MORNING 06/01/19   Lyn Records, MD  tamsulosin (FLOMAX) 0.4 MG CAPS capsule Take 0.8 mg by mouth daily. Take two (2) capsules (0.8 mg) by mouth daily.  Patient not taking: Reported on 02/16/2023 08/08/17   [provider]  traMADol (ULTRAM) 50 MG tablet Take 50 mg by mouth every 6 (six) hours as needed for moderate pain or severe pain. Patient not taking: Reported on 02/16/2023 06/07/20   [provider]  zolpidem (AMBIEN) 5 MG tablet Take 5 mg by mouth at bedtime. 09/01/21   [provider]      Allergies     Amlodipine besylate, Mirabegron, and Vibegron    Review of Systems   Review of Systems  Constitutional:  Negative for chills and fever.  Respiratory:  Positive for cough. Negative for shortness of breath.   Cardiovascular:  Positive for chest pain.  Gastrointestinal:  Positive for abdominal pain, nausea and vomiting. Negative for constipation and diarrhea.  Genitourinary:  Negative for dysuria.    Physical Exam Updated Vital Signs BP (!) 153/63 (BP Location: Right Arm)   Pulse 70   Temp 97.8 F (36.6 C) (Oral)   Resp 16   Ht 5\' 11"  (1.803 m)   Wt 92.1 kg   SpO2 96%   BMI 28.31 kg/m  Physical Exam Vitals and nursing note reviewed.  Constitutional:      General: He is not in acute distress.    Appearance: Normal appearance. He is well-developed.  HENT:     Head: Normocephalic and atraumatic.  Eyes:     Conjunctiva/sclera: Conjunctivae normal.  Cardiovascular:     Rate and Rhythm: Normal rate and regular rhythm.     Heart sounds: No murmur heard. Pulmonary:     Effort: Pulmonary effort is normal. No respiratory distress.     Breath sounds: Normal breath sounds.  Abdominal:     Palpations: Abdomen is soft.     Tenderness: There is abdominal tenderness in the epigastric area. There is no guarding or rebound.  Musculoskeletal:        General: No swelling.     Cervical back: Neck supple.  Skin:    General: Skin is warm and dry.     Capillary Refill: Capillary refill takes less than 2 seconds.  Neurological:     General: No focal deficit present.     Mental Status: He is alert.     ED Results / Procedures / Treatments   Labs (all labs ordered are listed, but only abnormal results are displayed) Labs Reviewed  COMPREHENSIVE METABOLIC PANEL - Abnormal; Notable for the following components:      Result Value   Sodium 132 (*)    Chloride 96 (*)    Glucose, Bld 134 (*)    BUN 27 (*)    Creatinine, Ser 2.17 (*)    GFR, Estimated 28 (*)    All other components  within normal limits  CBC WITH DIFFERENTIAL/PLATELET - Abnormal; Notable for the following components:   WBC 12.5 (*)    RBC 3.10 (*)    Hemoglobin 9.3 (*)    HCT 28.9 (*)    Neutro Abs 9.2 (*)    Monocytes Absolute 1.4 (*)    All other components within normal limits  URINALYSIS, ROUTINE W REFLEX MICROSCOPIC - Abnormal; Notable for the following components:   APPearance CLOUDY (*)  Protein, ur 100 (*)    Leukocytes,Ua LARGE (*)    Bacteria, UA FEW (*)    All other components within normal limits  RESP PANEL BY RT-PCR (RSV, FLU A&B, COVID)  [id number]  URINE CULTURE  LIPASE, BLOOD  POC OCCULT BLOOD, ED  TROPONIN I (HIGH SENSITIVITY)  TROPONIN I (HIGH SENSITIVITY)    EKG EKG Interpretation Date/Time:  Tuesday March 09 2023 07:50:03 EST Ventricular Rate:  65 PR Interval:  154 QRS Duration:  106 QT Interval:  448 QTC Calculation: 465 R Axis:   -13  Text Interpretation: Normal sinus rhythm Incomplete right bundle branch block Anterior infarct , age undetermined Abnormal ECG When compared with ECG of 05-Aug-2022 10:39, No significant change since last tracing Confirmed by Meridee Score [phone]) on 03/09/2023 7:53:09 AM  Radiology CT CHEST ABDOMEN PELVIS WO CONTRAST Result Date: 03/09/2023 CLINICAL DATA:  Two day history of abdominal pain with emesis associated with dysphagia and questionable chest pain. History of prostate cancer. * Tracking Code: BO * EXAM: CT CHEST, ABDOMEN AND PELVIS WITHOUT CONTRAST TECHNIQUE: Multidetector CT imaging of the chest, abdomen and pelvis was performed following the standard protocol without IV contrast. RADIATION DOSE REDUCTION: This exam was performed according to the departmental dose-optimization program which includes automated exposure control, adjustment of the mA and/or kV according to patient size and/or use of iterative reconstruction technique. COMPARISON:  Same day chest radiograph, CT chest dated 08/05/2022, nuclear medicine PET dated  06/23/2022, CT abdomen and pelvis dated 05/22/2021 FINDINGS: CT CHEST FINDINGS Cardiovascular: Multichamber cardiomegaly. Curvilinear hypoattenuation at the left ventricular apex. No significant pericardial fluid/thickening. Great vessels are normal in course and caliber. Coronary artery calcifications. Mediastinum/Nodes: Imaged thyroid gland without nodules meeting criteria for imaging follow-up by size. Mildly patulous, fluid-filled upper esophagus. No pathologically enlarged axillary, supraclavicular, mediastinal, or hilar lymph nodes. Lungs/Pleura: The central airways are patent. Increased conspicuity of lower lobe predominant subpleural reticulations. No substantial traction bronchiectasis or architectural distortion. No focal consolidation. No pneumothorax. Trace bilateral pleural effusions. Musculoskeletal: No definite focal osseous abnormality associated with the area of radiotracer uptake in the right anterior fourth rib. Multilevel degenerative changes of the thoracic spine. Median sternotomy wires are nondisplaced. CT ABDOMEN PELVIS FINDINGS Hepatobiliary: No focal hepatic lesions. No intra or extrahepatic biliary ductal dilation. Cholecystectomy. Pancreas: No focal lesions or main ductal dilation. Spleen: Normal in size without focal abnormality. Adrenals/Urinary Tract: No adrenal nodules. Atrophic bilateral kidneys. No suspicious renal mass on this noncontrast enhanced examination , calculi, or hydronephrosis. No focal bladder wall thickening. Stomach/Bowel: Normal appearance of the stomach. Cecum is again located in the right upper quadrant. No abnormal bowel dilation. Circumferential mural thickening of the anorectal junction. Colonic diverticulosis without acute diverticulitis. Appendix is not discretely seen. Vascular/Lymphatic: Aortic atherosclerosis. No enlarged abdominal or pelvic lymph nodes. Reproductive: Normal prostate gland size. Other: No free fluid, fluid collection, or free air.  Musculoskeletal: No acute or abnormal lytic or blastic osseous findings. Multilevel degenerative changes of the lumbar spine. Postsurgical changes of L1-L5 spinal fixation with increased L3 superior endplate compression deformity compared to 05/22/2021. Small fat containing midline ventral epigastric hernia. IMPRESSION: 1. Circumferential mural thickening of the anorectal junction, which may be related to underdistention or proctitis. 2. Postsurgical changes of L1-L5 spinal fixation with increased L3 superior endplate compression deformity compared to 05/22/2021. 3. No definite focal osseous abnormality associated with the area of previous radiotracer uptake in the right anterior fourth rib. 4. Curvilinear hypoattenuation at the left ventricular apex, which may represent sequela  of prior insult. 5. Trace bilateral pleural effusions. 6.  Aortic Atherosclerosis (ICD10-I70.0). Electronically Signed   By: Agustin Cree M.D.   On: 03/09/2023 10:36   DG Chest 1 View Result Date: 03/09/2023 CLINICAL DATA:  Cough. EXAM: CHEST  1 VIEW COMPARISON:  March 12, 2020. FINDINGS: Stable cardiomediastinal silhouette. Status post coronary bypass graft. Probable some degree of eventration involving the right hemidiaphragm. Mild right basilar atelectasis or infiltrate may be present as well. Bony thorax unremarkable. IMPRESSION: Possible mild right basilar opacity is noted concerning for pneumonia or possibly atelectasis. Followup PA and lateral chest X-ray is recommended in 3-4 weeks following trial of antibiotic therapy to ensure resolution and exclude underlying malignancy. Electronically Signed   By: Lupita Raider M.D.   On: 03/09/2023 08:09    Procedures Procedures    Medications Ordered in ED Medications  sodium chloride 0.9 % bolus 500 mL (0 mLs Intravenous Stopped 03/09/23 1211)  ondansetron (ZOFRAN) injection 4 mg (4 mg Intravenous Given 03/09/23 0744)  fentaNYL (SUBLIMAZE) injection 50 mcg (50 mcg Intravenous  Given 03/09/23 0744)  pantoprazole (PROTONIX) injection 40 mg (40 mg Intravenous Given 03/09/23 0744)    ED Course/ Medical Decision Making/ A&P Clinical Course as of 03/09/23 1724  Tue Mar 09, 2023  0754 Chest x-ray showing poor inspiration atelectasis versus infiltrate.  Awaiting radiology reading. [MB]  [phone] Rectal Exam done with [facility] as chaperone.  Normal tone no masses brown stool.  Symptoms for guaiac. [MB]  [id number] Patient did state that the medication helped a lot with his pain. [MB]  1048 Reviewed results of workup with daughter.  Will p.o. trial. [MB]  1142 Patient is tolerating p.o. well.  Daughter states he lives with her and she is comfortable taking him home.  He is already on a PPI.  Will send prescription for nausea medication and antibiotic.  Recommended close follow-up with PCP.  Return instructions discussed [MB]    Clinical Course User Index [MB] Terrilee Files, MD                                 Medical Decision Making Amount and/or Complexity of Data Reviewed Labs: ordered. Radiology: ordered.  Risk Prescription drug management.   This patient complains of upper abdominal pain nausea vomiting; this involves an extensive number of treatment Options and is a complaint that carries with it a high risk of complications and morbidity. The differential includes gastritis, peptic ulcer disease, biliary colic, GI bleed, viral syndrome  I ordered, reviewed and interpreted labs, which included CBC with elevated white count slightly lower hemoglobin than baseline, chemistries with low sodium elevated BUN and creatinine, urinalysis possible signs of infection sent for culture, fecal occult negative, troponins flat, COVID and flu negative I ordered medication IV fluids, PPI, nausea medication pain medication  and reviewed PMP when indicated. I ordered imaging studies which included chest x-ray, CT chest abdomen and pelvis and I independently    visualized and interpreted  imaging which showed rectal wall thickening Additional history obtained from patient's daughter Previous records obtained and reviewed in epic, no recent admissions Social determinants considered, no significant barriers Critical Interventions: None  After the interventions stated above, I reevaluated the patient and found patient have benign abdominal exam and tolerating p.o. Admission and further testing considered, patient and daughter comfortable plan for discharge and symptomatic treatment.  Recommended close follow-up with PCP.  Return instructions discussed  Final Clinical Impression(s) / ED Diagnoses Final diagnoses:  Upper abdominal pain  Lower urinary tract infectious disease  Nausea and vomiting, unspecified vomiting type    Rx / DC Orders ED Discharge Orders          Ordered    amoxicillin-clavulanate (AUGMENTIN) 875-125 MG tablet  Every 12 hours        03/09/23 1143    ondansetron (ZOFRAN) 4 MG tablet  Every 8 hours PRN        03/09/23 1143              Terrilee Files, MD 03/09/23 1727

## 2023-03-11 LAB — URINE CULTURE: Culture: >=100000 — AB

## 2023-03-12 ENCOUNTER — Telehealth (HOSPITAL_BASED_OUTPATIENT_CLINIC_OR_DEPARTMENT_OTHER): Payer: Self-pay | Admitting: Emergency Medicine

## 2023-03-12 NOTE — Telephone Encounter (Signed)
Post ED Visit - Positive Culture Follow-up  Culture report reviewed by antimicrobial stewardship pharmacist: [facility] []  Enzo Bi, Pharm.D. []  Celedonio Miyamoto, Pharm.D., BCPS AQ-ID []  Garvin Fila, Pharm.D., BCPS [x]  Georgina Pillion, Pharm.D., BCPS []  [place], [address].D., BCPS, AAHIVP []  Estella Husk, Pharm.D., BCPS, AAHIVP []  Lysle Pearl, PharmD, BCPS []  Phillips Climes, PharmD, BCPS []  Agapito Games, PharmD, BCPS []  Verlan Friends, PharmD []  Mervyn Gay, PharmD, BCPS []  Vinnie Level, PharmD  [facility] []  Len Childs, PharmD []  Greer Pickerel, PharmD []  Adalberto Cole, PharmD []  Perlie Gold, Rph []  Lonell Face) Jean Rosenthal, PharmD []  Earl Many, PharmD []  Junita Push, PharmD []  Dorna Leitz, PharmD []  Terrilee Files, PharmD []  Lynann Beaver, PharmD []  Keturah Barre, PharmD []  Loralee Pacas, PharmD []  Bernadene Person, PharmD   Positive urine culture Treated with Amoxicillin-POT Clavulanate, organism sensitive to the same and no further patient follow-up is required at this time.  Pamala Hurry 03/12/2023, 12:39 PM

## 2023-03-13 ENCOUNTER — Emergency Department (HOSPITAL_COMMUNITY): Payer: Medicare Other

## 2023-03-13 ENCOUNTER — Encounter (HOSPITAL_COMMUNITY): Payer: Self-pay

## 2023-03-13 ENCOUNTER — Inpatient Hospital Stay (HOSPITAL_COMMUNITY)
Admission: EM | Admit: 2023-03-13 | Discharge: 2023-03-19 | DRG: 064 | Disposition: A | Discharge status: Rehabilitation Facility | Payer: Medicare Other | Attending: Internal Medicine | Admitting: Internal Medicine

## 2023-03-13 ENCOUNTER — Emergency Department (HOSPITAL_COMMUNITY)
Admission: EM | Admit: 2023-03-13 | Discharge: 2023-03-13 | Discharge status: Against Medical Advice | Payer: Medicare Other | Source: Home / Self Care

## 2023-03-13 ENCOUNTER — Encounter (HOSPITAL_COMMUNITY): Payer: Self-pay | Admitting: *Deleted

## 2023-03-13 ENCOUNTER — Other Ambulatory Visit: Payer: Self-pay

## 2023-03-13 DIAGNOSIS — I4819 Other persistent atrial fibrillation: Secondary | ICD-10-CM

## 2023-03-13 DIAGNOSIS — I10 Essential (primary) hypertension: Secondary | ICD-10-CM | POA: Diagnosis not present

## 2023-03-13 DIAGNOSIS — R471 Dysarthria and anarthria: Secondary | ICD-10-CM | POA: Diagnosis present

## 2023-03-13 DIAGNOSIS — N3000 Acute cystitis without hematuria: Secondary | ICD-10-CM | POA: Diagnosis not present

## 2023-03-13 DIAGNOSIS — R5383 Other fatigue: Secondary | ICD-10-CM | POA: Diagnosis present

## 2023-03-13 DIAGNOSIS — I639 Cerebral infarction, unspecified: Secondary | ICD-10-CM | POA: Diagnosis not present

## 2023-03-13 DIAGNOSIS — Z96651 Presence of right artificial knee joint: Secondary | ICD-10-CM | POA: Diagnosis present

## 2023-03-13 DIAGNOSIS — K6289 Other specified diseases of anus and rectum: Secondary | ICD-10-CM | POA: Diagnosis present

## 2023-03-13 DIAGNOSIS — D649 Anemia, unspecified: Secondary | ICD-10-CM | POA: Insufficient documentation

## 2023-03-13 DIAGNOSIS — I6782 Cerebral ischemia: Secondary | ICD-10-CM | POA: Diagnosis not present

## 2023-03-13 DIAGNOSIS — Z888 Allergy status to other drugs, medicaments and biological substances status: Secondary | ICD-10-CM

## 2023-03-13 DIAGNOSIS — Z5321 Procedure and treatment not carried out due to patient leaving prior to being seen by health care provider: Secondary | ICD-10-CM | POA: Insufficient documentation

## 2023-03-13 DIAGNOSIS — I2510 Atherosclerotic heart disease of native coronary artery without angina pectoris: Secondary | ICD-10-CM | POA: Diagnosis not present

## 2023-03-13 DIAGNOSIS — I4891 Unspecified atrial fibrillation: Secondary | ICD-10-CM | POA: Diagnosis not present

## 2023-03-13 DIAGNOSIS — I6389 Other cerebral infarction: Secondary | ICD-10-CM | POA: Diagnosis not present

## 2023-03-13 DIAGNOSIS — E43 Unspecified severe protein-calorie malnutrition: Secondary | ICD-10-CM | POA: Diagnosis not present

## 2023-03-13 DIAGNOSIS — N184 Chronic kidney disease, stage 4 (severe): Secondary | ICD-10-CM | POA: Diagnosis not present

## 2023-03-13 DIAGNOSIS — R200 Anesthesia of skin: Secondary | ICD-10-CM | POA: Insufficient documentation

## 2023-03-13 DIAGNOSIS — Z79899 Other long term (current) drug therapy: Secondary | ICD-10-CM

## 2023-03-13 DIAGNOSIS — D631 Anemia in chronic kidney disease: Secondary | ICD-10-CM | POA: Diagnosis present

## 2023-03-13 DIAGNOSIS — R29700 NIHSS score 0: Secondary | ICD-10-CM | POA: Diagnosis not present

## 2023-03-13 DIAGNOSIS — Z7901 Long term (current) use of anticoagulants: Secondary | ICD-10-CM

## 2023-03-13 DIAGNOSIS — Z860101 Personal history of adenomatous and serrated colon polyps: Secondary | ICD-10-CM

## 2023-03-13 DIAGNOSIS — I63511 Cerebral infarction due to unspecified occlusion or stenosis of right middle cerebral artery: Principal | ICD-10-CM | POA: Diagnosis present

## 2023-03-13 DIAGNOSIS — I129 Hypertensive chronic kidney disease with stage 1 through stage 4 chronic kidney disease, or unspecified chronic kidney disease: Secondary | ICD-10-CM | POA: Diagnosis not present

## 2023-03-13 DIAGNOSIS — Z9181 History of falling: Secondary | ICD-10-CM

## 2023-03-13 DIAGNOSIS — R29818 Other symptoms and signs involving the nervous system: Secondary | ICD-10-CM | POA: Diagnosis not present

## 2023-03-13 DIAGNOSIS — I252 Old myocardial infarction: Secondary | ICD-10-CM | POA: Diagnosis not present

## 2023-03-13 DIAGNOSIS — Z8546 Personal history of malignant neoplasm of prostate: Secondary | ICD-10-CM

## 2023-03-13 DIAGNOSIS — Z951 Presence of aortocoronary bypass graft: Secondary | ICD-10-CM

## 2023-03-13 DIAGNOSIS — Z8249 Family history of ischemic heart disease and other diseases of the circulatory system: Secondary | ICD-10-CM

## 2023-03-13 DIAGNOSIS — E7800 Pure hypercholesterolemia, unspecified: Secondary | ICD-10-CM | POA: Diagnosis not present

## 2023-03-13 DIAGNOSIS — Z1152 Encounter for screening for COVID-19: Secondary | ICD-10-CM

## 2023-03-13 DIAGNOSIS — Z1623 Resistance to quinolones and fluoroquinolones: Secondary | ICD-10-CM | POA: Diagnosis present

## 2023-03-13 DIAGNOSIS — R262 Difficulty in walking, not elsewhere classified: Secondary | ICD-10-CM | POA: Diagnosis not present

## 2023-03-13 DIAGNOSIS — R112 Nausea with vomiting, unspecified: Secondary | ICD-10-CM | POA: Diagnosis present

## 2023-03-13 DIAGNOSIS — Z7982 Long term (current) use of aspirin: Secondary | ICD-10-CM | POA: Diagnosis not present

## 2023-03-13 DIAGNOSIS — Z823 Family history of stroke: Secondary | ICD-10-CM

## 2023-03-13 DIAGNOSIS — Z8673 Personal history of transient ischemic attack (TIA), and cerebral infarction without residual deficits: Secondary | ICD-10-CM

## 2023-03-13 DIAGNOSIS — R1310 Dysphagia, unspecified: Secondary | ICD-10-CM | POA: Diagnosis present

## 2023-03-13 DIAGNOSIS — B9620 Unspecified Escherichia coli [E. coli] as the cause of diseases classified elsewhere: Secondary | ICD-10-CM | POA: Diagnosis present

## 2023-03-13 DIAGNOSIS — K219 Gastro-esophageal reflux disease without esophagitis: Secondary | ICD-10-CM | POA: Diagnosis present

## 2023-03-13 DIAGNOSIS — Z6828 Body mass index (BMI) 28.0-28.9, adult: Secondary | ICD-10-CM

## 2023-03-13 DIAGNOSIS — E871 Hypo-osmolality and hyponatremia: Secondary | ICD-10-CM | POA: Diagnosis present

## 2023-03-13 DIAGNOSIS — K5730 Diverticulosis of large intestine without perforation or abscess without bleeding: Secondary | ICD-10-CM | POA: Diagnosis not present

## 2023-03-13 DIAGNOSIS — N390 Urinary tract infection, site not specified: Secondary | ICD-10-CM | POA: Diagnosis present

## 2023-03-13 DIAGNOSIS — I480 Paroxysmal atrial fibrillation: Secondary | ICD-10-CM | POA: Diagnosis not present

## 2023-03-13 DIAGNOSIS — E785 Hyperlipidemia, unspecified: Secondary | ICD-10-CM | POA: Diagnosis present

## 2023-03-13 LAB — CBC WITH DIFFERENTIAL/PLATELET
Abs Immature Granulocytes: 0.03 10*3/uL (ref 0.00–0.07)
Basophils Absolute: 0 10*3/uL (ref 0.0–0.1)
Basophils Relative: 0 %
Eosinophils Absolute: 0.4 10*3/uL (ref 0.0–0.5)
Eosinophils Relative: 5 %
HCT: 30.2 % — ABNORMAL LOW (ref 39.0–52.0)
Hemoglobin: 9.7 g/dL — ABNORMAL LOW (ref 13.0–17.0)
Immature Granulocytes: 0 %
Lymphocytes Relative: 26 %
Lymphs Abs: 2 10*3/uL (ref 0.7–4.0)
MCH: 30.4 pg (ref 26.0–34.0)
MCHC: 32.1 g/dL (ref 30.0–36.0)
MCV: 94.7 fL (ref 80.0–100.0)
Monocytes Absolute: 0.8 10*3/uL (ref 0.1–1.0)
Monocytes Relative: 10 %
Neutro Abs: 4.3 10*3/uL (ref 1.7–7.7)
Neutrophils Relative %: 59 %
Platelets: 294 10*3/uL (ref 150–400)
RBC: 3.19 MIL/uL — ABNORMAL LOW (ref 4.22–5.81)
RDW: 14 % (ref 11.5–15.5)
WBC: 7.6 10*3/uL (ref 4.0–10.5)
nRBC: 0 % (ref 0.0–0.2)

## 2023-03-13 LAB — URINALYSIS, ROUTINE W REFLEX MICROSCOPIC
Bacteria, UA: NONE SEEN
Bilirubin Urine: NEGATIVE
Glucose, UA: NEGATIVE mg/dL
Hgb urine dipstick: NEGATIVE
Ketones, ur: NEGATIVE mg/dL
Leukocytes,Ua: NEGATIVE
Nitrite: NEGATIVE
Protein, ur: 30 mg/dL — AB
Specific Gravity, Urine: 1.02 (ref 1.005–1.030)
pH: 5 (ref 5.0–8.0)

## 2023-03-13 LAB — BASIC METABOLIC PANEL
Anion gap: 11 (ref 5–15)
BUN: 20 mg/dL (ref 8–23)
CO2: 22 mmol/L (ref 22–32)
Calcium: 8.7 mg/dL — ABNORMAL LOW (ref 8.9–10.3)
Chloride: 100 mmol/L (ref 98–111)
Creatinine, Ser: 2.26 mg/dL — ABNORMAL HIGH (ref 0.61–1.24)
GFR, Estimated: 27 mL/min — ABNORMAL LOW (ref >60)
Glucose, Bld: 117 mg/dL — ABNORMAL HIGH (ref 70–99)
Potassium: 4.5 mmol/L (ref 3.5–5.1)
Sodium: 133 mmol/L — ABNORMAL LOW (ref 135–145)

## 2023-03-13 LAB — CBG MONITORING, ED: Glucose-Capillary: 102 mg/dL — ABNORMAL HIGH (ref 70–99)

## 2023-03-13 MED ORDER — GADOBUTROL 1 MMOL/ML IV SOLN: 9.0000 mL | Freq: Once | INTRAVENOUS | Status: DC | PRN

## 2023-03-13 NOTE — ED Provider Notes (Signed)
[place] EMERGENCY DEPARTMENT AT [facility] Provider Note  CSN: [id number] Arrival date & time: 03/13/23 2129  Chief Complaint(s) Numbness  HPI Brandon Harris is a 89 y.o. male history of CKD, prior stroke, coronary artery disease presenting to the emergency department with weakness.  Patient and daughter report that around 3 AM patient developed weakness to the left side, trouble walking.  This is not his baseline.  They brought him to the emergency department.  Brandon Harris was actually in the emergency department earlier today, was sent for MRI, however Brandon Harris was marked as eloped when they can't find him.  Brandon Harris was at MRI.  Patient denies any headaches.  Brandon Harris was in the emergency department actually a few days ago for some URI symptoms, was discharged.  Brandon Harris is still having some symptoms but improving.   Past Medical History Past Medical History:  Diagnosis Date   Arthritis    Carotid arterial disease (HCC)    Chronic kidney disease (CKD), stage IV (severe) (HCC)    Diverticulosis    colonic diverticulosis   Gallstones    GERD (gastroesophageal reflux disease)    Hypertension    Myocardial infarction [facility])    Prostate cancer (HCC)    Status post coronary artery bypass grafting     intraoperative cholangiogram   Stroke [facility])    patient reports no deficits    Urinary incontinence    bladder leakage - " patient wears a pad"   Patient Active Problem List   Diagnosis Date Noted   Acute stress disorder 09/22/2021   Adjustment disorder 09/22/2021   Allergic rhinitis 09/22/2021   Benign prostatic hyperplasia 09/22/2021   Esophageal dysphagia 09/22/2021   Gastroesophageal reflux disease 09/22/2021   Grief 09/22/2021   Insomnia 09/22/2021   Long term (current) use of anticoagulants 09/22/2021   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   Moderate major depression, single episode (HCC) 09/22/2021   Overactive bladder 09/22/2021   Prostate cancer (HCC) 09/22/2021   Pain  in joint of right shoulder 10/24/2020   Pain in joint of left knee 06/06/2020   CKD (chronic kidney disease) stage 4, GFR 15-29 ml/min (HCC) 10/27/2017   Dyspnea on exertion 05/03/2015   Overweight (BMI 25.0-29.9) 12/19/2014   S/P right TKA 12/18/2014   Carotid disease, bilateral (HCC) 02/28/2013   Coronary artery disease involving coronary bypass graft of native heart with angina pectoris (HCC) 02/28/2013   Essential hypertension 02/28/2013   Hyperlipidemia 02/28/2013   Biceps tendonitis 10/02/2011   Home Medication(s) Prior to Admission medications   Medication Sig Start Date End Date Taking? Authorizing Provider  ALPRAZolam Prudy Feeler) 0.25 MG tablet Take 0.25 mg by mouth at bedtime as needed for anxiety or sleep. TAKE ONE TABLET AT NIGHT 02/13/13   [provider]  amitriptyline (ELAVIL) 10 MG tablet Take 20 mg by mouth in the morning and at bedtime.  08/08/17   [provider]  amLODipine (NORVASC) 10 MG tablet Take 1 tablet (10 mg total) by mouth every morning. 02/02/23   Nahser, Deloris Ping, MD  amoxicillin-clavulanate (AUGMENTIN) 875-125 MG tablet Take 1 tablet by mouth every 12 (twelve) hours. 03/09/23   Terrilee Files, MD  aspirin EC 81 MG tablet Take 1 tablet (81 mg total) by mouth daily. Patient not taking: Reported on 02/16/2023 06/03/16   Lyn Records, MD  atorvastatin (LIPITOR) 80 MG tablet Take 80 mg by mouth every evening.     [provider]  carvedilol (COREG) 12.5 MG tablet  Take one tablet by mouth every morning and take one tablet by mouth everyday at bedtime 02/02/23   Nahser, Deloris Ping, MD  furosemide (LASIX) 20 MG tablet TAKE 1 TABLET BY MOUTH WEEKLY ON MONDAY, [facility] & FRIDAY    [provider]  hydrALAZINE (APRESOLINE) 25 MG tablet Take 1 tablet (25 mg total) by mouth 3 (three) times daily. To be taken with (50 mg) tablet three times daily. 01/28/23   Nahser, Deloris Ping, MD  hydrALAZINE (APRESOLINE) 50 MG tablet Take one tablet by mouth  every morning and take one tablet by mouth at noon and take one tablet by mouth everyday at bedtime. 01/28/23   Nahser, Deloris Ping, MD  isosorbide mononitrate (IMDUR) 60 MG 24 hr tablet Take 1 tablet (60 mg total) by mouth every morning. 01/28/23   Nahser, Deloris Ping, MD  Melatonin 10 MG TABS Take 10 mg by mouth daily.    [provider]  mirtazapine (REMERON) 15 MG tablet Take 15 mg by mouth at bedtime. 11/09/19   [provider]  nitroGLYCERIN (NITROSTAT) 0.4 MG SL tablet Dissolve 1 tab under tongue as needed for chest pain. May repeat every 5 minutes x 2 doses. If no relief call 9-1-1. 02/17/22   Lyn Records, MD  ondansetron (ZOFRAN) 4 MG tablet Take 1 tablet (4 mg total) by mouth every 8 (eight) hours as needed for nausea or vomiting. 03/09/23   Terrilee Files, MD  pantoprazole (PROTONIX) 40 MG tablet TAKE ONE TABLET BY MOUTH EVERY MORNING 06/01/19   Lyn Records, MD  tamsulosin (FLOMAX) 0.4 MG CAPS capsule Take 0.8 mg by mouth daily. Take two (2) capsules (0.8 mg) by mouth daily.  Patient not taking: Reported on 02/16/2023 08/08/17   [provider]  traMADol (ULTRAM) 50 MG tablet Take 50 mg by mouth every 6 (six) hours as needed for moderate pain or severe pain. Patient not taking: Reported on 02/16/2023 06/07/20   [provider]  zolpidem (AMBIEN) 5 MG tablet Take 5 mg by mouth at bedtime. 09/01/21   [provider]                                                                                                                                    Past Surgical History Past Surgical History:  Procedure Laterality Date   BACK SURGERY     x 4, has titanium in back per patient    CHOLECYSTECTOMY, LAPAROSCOPIC     CORONARY ARTERY BYPASS GRAFT  2001   CRYOTHERAPY     Prostate Cancer   LEFT HEART CATH AND CORS/GRAFTS ANGIOGRAPHY N/A 10/27/2017   Procedure: LEFT HEART CATH AND CORS/GRAFTS ANGIOGRAPHY;  Surgeon: Lyn Records, MD;  Location: [facility] INVASIVE CV  LAB;  Service: Cardiovascular;  Laterality: N/A;   prostate cancer     ROTATOR CUFF REPAIR     LEFT   TOTAL KNEE ARTHROPLASTY Right 12/18/2014  Procedure: TOTAL RIGHT KNEE ARTHROPLASTY;  Surgeon: Durene Romans, MD;  Location: [facility] ORS;  Service: Orthopedics;  Laterality: Right;   Family History Family History  Problem Relation Age of Onset   Heart failure Mother     Social History Social History   Tobacco Use   Smoking status: Never   Smokeless tobacco: Never  Vaping Use   Vaping status: Never Used  Substance Use Topics   Alcohol use: No   Drug use: No   Allergies Amlodipine besylate, Mirabegron, and Vibegron  Review of Systems Review of Systems  All other systems reviewed and are negative.   Physical Exam Vital Signs  I have reviewed the triage vital signs BP 132/84   Pulse 99   Temp 97.6 F (36.4 C)   Resp 15   SpO2 97%  Physical Exam Vitals and nursing note reviewed.  Constitutional:      General: Brandon Harris is not in acute distress.    Appearance: Normal appearance.  HENT:     Mouth/Throat:     Mouth: Mucous membranes are moist.  Eyes:     Conjunctiva/sclera: Conjunctivae normal.  Cardiovascular:     Rate and Rhythm: Normal rate and regular rhythm.  Pulmonary:     Effort: Pulmonary effort is normal. No respiratory distress.     Breath sounds: Normal breath sounds.  Abdominal:     General: Abdomen is flat.     Palpations: Abdomen is soft.     Tenderness: There is no abdominal tenderness.  Musculoskeletal:     Right lower leg: No edema.     Left lower leg: No edema.  Skin:    General: Skin is warm and dry.     Capillary Refill: Capillary refill takes less than 2 seconds.  Neurological:     Mental Status: Brandon Harris is alert and oriented to person, place, and time.     Comments: Cranial nerves II through XII intact, strength 5 out of 5 in the bilateral upper and lower extremities, no sensory deficit to light touch, mild left sided dysmetria without right-sided  dysmetria.  Gait testing deferred  Psychiatric:        Mood and Affect: Mood normal.        Behavior: Behavior normal.     ED Results and Treatments Labs (all labs ordered are listed, but only abnormal results are displayed) Labs Reviewed - No data to display                                                                                                                        Radiology MR BRAIN WO CONTRAST Result Date: 03/13/2023 CLINICAL DATA:  Neuro deficit, acute, stroke suspected. Left upper extremity numbness. Difficulty walking. EXAM: MRI HEAD WITHOUT CONTRAST TECHNIQUE: Multiplanar, multiecho pulse sequences of the brain and surrounding structures were obtained without intravenous contrast. COMPARISON:  Head CT 03/13/2023 FINDINGS: Brain: Scattered small acute cortical and subcortical infarcts are present in the posterior right frontal and anterior right parietal lobes (  MCA territory). Several chronic microhemorrhages are noted in the cerebral and cerebellar hemispheres. Patchy to confluent T2 hyperintensities in the cerebral white matter bilaterally are nonspecific but compatible with moderate to severe chronic small vessel ischemic disease. Small chronic infarcts are present in the right occipital lobe, both cerebellar hemispheres, right pons, thalami, and bilateral basal ganglia. There is moderate cerebral and cerebellar atrophy. No mass, midline shift, or extra-axial fluid collection is identified. Vascular: Absent flow void in the distal left vertebral artery. Skull and upper cervical spine: Unremarkable bone marrow signal. Sinuses/Orbits: Bilateral cataract extraction. Opacification of a single posterior left ethmoid air cell. No significant mastoid fluid. Other: None. IMPRESSION: 1. Small acute right frontoparietal infarcts. 2. Moderate to severe chronic small vessel ischemic disease with numerous chronic infarcts. 3. 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 Electronically  Signed   By: Sebastian Ache M.D.   On: 03/13/2023 17:31   CT Head Wo Contrast Result Date: 03/13/2023 CLINICAL DATA:  Neuro deficit, acute, stroke suspected EXAM: CT HEAD WITHOUT CONTRAST TECHNIQUE: Contiguous axial images were obtained from the base of the skull through the vertex without intravenous contrast. RADIATION DOSE REDUCTION: This exam was performed according to the departmental dose-optimization program which includes automated exposure control, adjustment of the mA and/or kV according to patient size and/or use of iterative reconstruction technique. COMPARISON:  08/05/2022 FINDINGS: Brain: No evidence of acute infarction, hemorrhage, hydrocephalus, extra-axial collection or mass lesion/mass effect. Patchy low-density changes within the periventricular and subcortical white matter most compatible with chronic microvascular ischemic change. Moderate diffuse cerebral volume loss. Vascular: Atherosclerotic calcifications involving the large vessels of the skull base. No unexpected hyperdense vessel. Skull: Normal. Negative for fracture or focal lesion. Sinuses/Orbits: No acute finding. Other: None. IMPRESSION: 1. No acute intracranial findings. 2. Chronic microvascular ischemic change and cerebral volume loss. Electronically Signed   By: Duanne Guess D.O.   On: 03/13/2023 11:57    Pertinent labs & imaging results that were available during my care of the patient were reviewed by me and considered in my medical decision making (see MDM for details).  Medications Ordered in ED Medications - No data to display                                                                                                                                   Procedures Procedures  (including critical care time)  Medical Decision Making / ED Course   MDM:  89 year old presenting to the emergency department with left-sided weakness.  On exam, patient has intact strength but does seem to have some dysmetria.   MRI was obtained earlier today which does show some right-sided frontal parietal strokes in an MCA distribution concerning for probably an embolic source.  His EKG shows new onset A-fib which is likely the cause of his strokes.  Family deny any history of A-fib.  Discussed with Dr. Zerita Boers Brandon Harris request [facility].  Requested additional imaging with MRA for vessel imaging given embolic distribution.  Given his CKD Brandon Harris needs MRA rather than any CT scans.  Patient without evidence of LVO at this time. discussed results with patient's family and the patient.  They are agreeable to admission.  Discussed with Dr. Toniann Fail who will admit the patient for new onset stroke.      Additional history obtained: -Additional history obtained from family -External records from outside source obtained and reviewed including: Chart review including previous notes, labs, imaging, consultation notes including prior notes    Lab Tests: -I ordered, reviewed, and interpreted labs.   The pertinent results include:   Labs Reviewed - No data to display  Notable for CKD, chronic anemia   EKG   EKG Interpretation Date/Time:  Saturday March 13 2023 22:52:28 EST Ventricular Rate:  99 PR Interval:    QRS Duration:  107 QT Interval:  363 QTC Calculation: 466 R Axis:   -19  Text Interpretation: Atrial fibrillation Borderline left axis deviation RSR' in V1 or V2, probably normal variant Borderline repolarization abnormality Confirmed by Alvino Blood ([id number]) on 03/13/2023 10:53:25 PM         Imaging Studies ordered: I ordered imaging studies including MRI brain On my interpretation imaging demonstrates acute stroke  I independently visualized and interpreted imaging. I agree with the radiologist interpretation   Medicines ordered and prescription drug management: No orders of the defined types were placed in this encounter.   -I have reviewed the patients home medicines and have  made adjustments as needed   Consultations Obtained: I requested consultation with the neurologist,  and discussed lab and imaging findings as well as pertinent plan - they recommend: admission   Reevaluation: After the interventions noted above, I reevaluated the patient and found that their symptoms have improved  Co morbidities that complicate the patient evaluation  Past Medical History:  Diagnosis Date   Arthritis    Carotid arterial disease (HCC)    Chronic kidney disease (CKD), stage IV (severe) (HCC)    Diverticulosis    colonic diverticulosis   Gallstones    GERD (gastroesophageal reflux disease)    Hypertension    Myocardial infarction (HCC)    Prostate cancer (HCC)    Status post coronary artery bypass grafting     intraoperative cholangiogram   Stroke [facility])    patient reports no deficits    Urinary incontinence    bladder leakage - " patient wears a pad"      Dispostion: Disposition decision including need for hospitalization was considered, and patient admitted to the hospital.    Final Clinical Impression(s) / ED Diagnoses Final diagnoses:  Acute ischemic stroke (HCC)  Paroxysmal atrial fibrillation (HCC)     This chart was dictated using voice recognition software.  Despite best efforts to proofread,  errors can occur which can change the documentation meaning.    Lonell Grandchild, MD 03/13/23 [phone]

## 2023-03-13 NOTE — ED Triage Notes (Signed)
Pt reports with left arm and left hand numbness since yesterday. Pt reports also not being able to walk as good.

## 2023-03-13 NOTE — ED Triage Notes (Signed)
Pt and daughter stopped this RN in the lobby stating they had been waiting since 10:30am to be seen. Noted that pt was discharged at 1630 when he didn't answer to be roomed. At that time pt was in MRI.   Per previous triage note "Pt reports with left arm and left hand numbness since yesterday. Pt reports also not being able to walk as good"

## 2023-03-14 ENCOUNTER — Emergency Department (HOSPITAL_COMMUNITY): Payer: Medicare Other

## 2023-03-14 ENCOUNTER — Inpatient Hospital Stay (HOSPITAL_COMMUNITY): Payer: Medicare Other

## 2023-03-14 ENCOUNTER — Encounter (HOSPITAL_COMMUNITY): Payer: Self-pay | Admitting: Internal Medicine

## 2023-03-14 DIAGNOSIS — R112 Nausea with vomiting, unspecified: Secondary | ICD-10-CM | POA: Diagnosis not present

## 2023-03-14 DIAGNOSIS — Z8546 Personal history of malignant neoplasm of prostate: Secondary | ICD-10-CM | POA: Diagnosis not present

## 2023-03-14 DIAGNOSIS — I6389 Other cerebral infarction: Secondary | ICD-10-CM

## 2023-03-14 DIAGNOSIS — I4891 Unspecified atrial fibrillation: Secondary | ICD-10-CM | POA: Diagnosis not present

## 2023-03-14 DIAGNOSIS — Z8249 Family history of ischemic heart disease and other diseases of the circulatory system: Secondary | ICD-10-CM | POA: Diagnosis not present

## 2023-03-14 DIAGNOSIS — Z96651 Presence of right artificial knee joint: Secondary | ICD-10-CM | POA: Diagnosis not present

## 2023-03-14 DIAGNOSIS — I10 Essential (primary) hypertension: Secondary | ICD-10-CM | POA: Diagnosis not present

## 2023-03-14 DIAGNOSIS — N3000 Acute cystitis without hematuria: Secondary | ICD-10-CM | POA: Diagnosis not present

## 2023-03-14 DIAGNOSIS — I129 Hypertensive chronic kidney disease with stage 1 through stage 4 chronic kidney disease, or unspecified chronic kidney disease: Secondary | ICD-10-CM | POA: Diagnosis not present

## 2023-03-14 DIAGNOSIS — I6523 Occlusion and stenosis of bilateral carotid arteries: Secondary | ICD-10-CM | POA: Diagnosis not present

## 2023-03-14 DIAGNOSIS — R471 Dysarthria and anarthria: Secondary | ICD-10-CM | POA: Diagnosis not present

## 2023-03-14 DIAGNOSIS — Z1623 Resistance to quinolones and fluoroquinolones: Secondary | ICD-10-CM | POA: Diagnosis not present

## 2023-03-14 DIAGNOSIS — Z951 Presence of aortocoronary bypass graft: Secondary | ICD-10-CM | POA: Diagnosis not present

## 2023-03-14 DIAGNOSIS — I480 Paroxysmal atrial fibrillation: Secondary | ICD-10-CM | POA: Diagnosis not present

## 2023-03-14 DIAGNOSIS — M7989 Other specified soft tissue disorders: Secondary | ICD-10-CM | POA: Diagnosis not present

## 2023-03-14 DIAGNOSIS — E871 Hypo-osmolality and hyponatremia: Secondary | ICD-10-CM | POA: Diagnosis not present

## 2023-03-14 DIAGNOSIS — I63511 Cerebral infarction due to unspecified occlusion or stenosis of right middle cerebral artery: Secondary | ICD-10-CM | POA: Diagnosis not present

## 2023-03-14 DIAGNOSIS — K5900 Constipation, unspecified: Secondary | ICD-10-CM | POA: Diagnosis not present

## 2023-03-14 DIAGNOSIS — D649 Anemia, unspecified: Secondary | ICD-10-CM | POA: Insufficient documentation

## 2023-03-14 DIAGNOSIS — Z7901 Long term (current) use of anticoagulants: Secondary | ICD-10-CM | POA: Diagnosis not present

## 2023-03-14 DIAGNOSIS — I69354 Hemiplegia and hemiparesis following cerebral infarction affecting left non-dominant side: Secondary | ICD-10-CM | POA: Diagnosis not present

## 2023-03-14 DIAGNOSIS — K551 Chronic vascular disorders of intestine: Secondary | ICD-10-CM | POA: Diagnosis not present

## 2023-03-14 DIAGNOSIS — K6289 Other specified diseases of anus and rectum: Secondary | ICD-10-CM | POA: Insufficient documentation

## 2023-03-14 DIAGNOSIS — I639 Cerebral infarction, unspecified: Secondary | ICD-10-CM | POA: Diagnosis not present

## 2023-03-14 DIAGNOSIS — Z888 Allergy status to other drugs, medicaments and biological substances status: Secondary | ICD-10-CM | POA: Diagnosis not present

## 2023-03-14 DIAGNOSIS — M25572 Pain in left ankle and joints of left foot: Secondary | ICD-10-CM | POA: Diagnosis not present

## 2023-03-14 DIAGNOSIS — N390 Urinary tract infection, site not specified: Secondary | ICD-10-CM | POA: Diagnosis not present

## 2023-03-14 DIAGNOSIS — I2510 Atherosclerotic heart disease of native coronary artery without angina pectoris: Secondary | ICD-10-CM | POA: Diagnosis not present

## 2023-03-14 DIAGNOSIS — I4819 Other persistent atrial fibrillation: Secondary | ICD-10-CM | POA: Diagnosis not present

## 2023-03-14 DIAGNOSIS — B9620 Unspecified Escherichia coli [E. coli] as the cause of diseases classified elsewhere: Secondary | ICD-10-CM | POA: Diagnosis present

## 2023-03-14 DIAGNOSIS — D631 Anemia in chronic kidney disease: Secondary | ICD-10-CM | POA: Diagnosis not present

## 2023-03-14 DIAGNOSIS — I951 Orthostatic hypotension: Secondary | ICD-10-CM | POA: Diagnosis not present

## 2023-03-14 DIAGNOSIS — R109 Unspecified abdominal pain: Secondary | ICD-10-CM | POA: Diagnosis not present

## 2023-03-14 DIAGNOSIS — M19072 Primary osteoarthritis, left ankle and foot: Secondary | ICD-10-CM | POA: Diagnosis not present

## 2023-03-14 DIAGNOSIS — I63411 Cerebral infarction due to embolism of right middle cerebral artery: Secondary | ICD-10-CM | POA: Diagnosis not present

## 2023-03-14 DIAGNOSIS — C61 Malignant neoplasm of prostate: Secondary | ICD-10-CM | POA: Diagnosis not present

## 2023-03-14 DIAGNOSIS — K219 Gastro-esophageal reflux disease without esophagitis: Secondary | ICD-10-CM | POA: Diagnosis not present

## 2023-03-14 DIAGNOSIS — R29818 Other symptoms and signs involving the nervous system: Secondary | ICD-10-CM | POA: Diagnosis not present

## 2023-03-14 DIAGNOSIS — Z1152 Encounter for screening for COVID-19: Secondary | ICD-10-CM | POA: Diagnosis not present

## 2023-03-14 DIAGNOSIS — I252 Old myocardial infarction: Secondary | ICD-10-CM | POA: Diagnosis not present

## 2023-03-14 DIAGNOSIS — M25472 Effusion, left ankle: Secondary | ICD-10-CM | POA: Diagnosis not present

## 2023-03-14 DIAGNOSIS — K5730 Diverticulosis of large intestine without perforation or abscess without bleeding: Secondary | ICD-10-CM | POA: Diagnosis not present

## 2023-03-14 DIAGNOSIS — R1310 Dysphagia, unspecified: Secondary | ICD-10-CM | POA: Diagnosis not present

## 2023-03-14 DIAGNOSIS — N184 Chronic kidney disease, stage 4 (severe): Secondary | ICD-10-CM

## 2023-03-14 DIAGNOSIS — Z7982 Long term (current) use of aspirin: Secondary | ICD-10-CM | POA: Diagnosis not present

## 2023-03-14 DIAGNOSIS — E7800 Pure hypercholesterolemia, unspecified: Secondary | ICD-10-CM | POA: Diagnosis not present

## 2023-03-14 DIAGNOSIS — Z79899 Other long term (current) drug therapy: Secondary | ICD-10-CM | POA: Diagnosis not present

## 2023-03-14 DIAGNOSIS — E785 Hyperlipidemia, unspecified: Secondary | ICD-10-CM | POA: Diagnosis not present

## 2023-03-14 DIAGNOSIS — E43 Unspecified severe protein-calorie malnutrition: Secondary | ICD-10-CM | POA: Diagnosis not present

## 2023-03-14 DIAGNOSIS — Z96641 Presence of right artificial hip joint: Secondary | ICD-10-CM | POA: Diagnosis not present

## 2023-03-14 LAB — CBC
HCT: 30.3 % — ABNORMAL LOW (ref 39.0–52.0)
Hemoglobin: 9.9 g/dL — ABNORMAL LOW (ref 13.0–17.0)
MCH: 30.6 pg (ref 26.0–34.0)
MCHC: 32.7 g/dL (ref 30.0–36.0)
MCV: 93.5 fL (ref 80.0–100.0)
Platelets: 296 10*3/uL (ref 150–400)
RBC: 3.24 MIL/uL — ABNORMAL LOW (ref 4.22–5.81)
RDW: 13.7 % (ref 11.5–15.5)
WBC: 8.3 10*3/uL (ref 4.0–10.5)
nRBC: 0 % (ref 0.0–0.2)

## 2023-03-14 LAB — TSH: TSH: 1.328 u[IU]/mL (ref 0.350–4.500)

## 2023-03-14 LAB — RETICULOCYTES
Immature Retic Fract: 10.2 % (ref 2.3–15.9)
RBC.: 3.26 MIL/uL — ABNORMAL LOW (ref 4.22–5.81)
Retic Count, Absolute: 65.9 10*3/uL (ref 19.0–186.0)
Retic Ct Pct: 2 % (ref 0.4–3.1)

## 2023-03-14 LAB — ECHOCARDIOGRAM COMPLETE: S' Lateral: 3.3 cm

## 2023-03-14 LAB — HEMOGLOBIN A1C
Hgb A1c MFr Bld: 5.5 % (ref 4.8–5.6)
Mean Plasma Glucose: 111.15 mg/dL

## 2023-03-14 LAB — CREATININE, SERUM
Creatinine, Ser: 2.08 mg/dL — ABNORMAL HIGH (ref 0.61–1.24)
GFR, Estimated: 29 mL/min — ABNORMAL LOW (ref >60)

## 2023-03-14 LAB — LIPID PANEL
Cholesterol: 98 mg/dL (ref 0–200)
HDL: 31 mg/dL — ABNORMAL LOW (ref >40)
LDL Cholesterol: 35 mg/dL (ref 0–99)
Total CHOL/HDL Ratio: 3.2 {ratio}
Triglycerides: 161 mg/dL — ABNORMAL HIGH (ref <150)
VLDL: 32 mg/dL (ref 0–40)

## 2023-03-14 LAB — TROPONIN I (HIGH SENSITIVITY)
Troponin I (High Sensitivity): 15 ng/L (ref <18)
Troponin I (High Sensitivity): 18 ng/L — ABNORMAL HIGH (ref <18)

## 2023-03-14 LAB — HEPARIN LEVEL (UNFRACTIONATED)
Heparin Unfractionated: 0.34 [IU]/mL (ref 0.30–0.70)
Heparin Unfractionated: 0.26 [IU]/mL — ABNORMAL LOW (ref 0.30–0.70)

## 2023-03-14 LAB — IRON AND TIBC
Iron: 46 ug/dL (ref 45–182)
Saturation Ratios: 17 % — ABNORMAL LOW (ref 17.9–39.5)
TIBC: 276 ug/dL (ref 250–450)
UIBC: 230 ug/dL

## 2023-03-14 LAB — FERRITIN: Ferritin: 13 ng/mL — ABNORMAL LOW (ref 24–336)

## 2023-03-14 LAB — FOLATE: Folate: 11.8 ng/mL (ref >5.9)

## 2023-03-14 LAB — VITAMIN B12: Vitamin B-12: 203 pg/mL (ref 180–914)

## 2023-03-14 LAB — LACTIC ACID, PLASMA: Lactic Acid, Venous: 0.8 mmol/L (ref 0.5–1.9)

## 2023-03-14 MED ORDER — ASPIRIN 300 MG RE SUPP: 300.0000 mg | Freq: Every day | RECTAL | Status: DC

## 2023-03-14 MED ORDER — HEPARIN (PORCINE) 25000 UT/250ML-% IV SOLN
1100.0000 [IU]/h | INTRAVENOUS | Status: DC
Start: 2023-03-14 — End: 2023-03-15
  Administered 2023-03-14: 1100 [IU]/h via INTRAVENOUS
  Filled 2023-03-14 (×3): qty 250

## 2023-03-14 MED ORDER — ATORVASTATIN CALCIUM 80 MG PO TABS
80.0000 mg | ORAL_TABLET | Freq: Every evening | ORAL | Status: DC
  Administered 2023-03-14 – 2023-03-18 (×5): 80 mg via ORAL
  Filled 2023-03-14: qty 1
  Filled 2023-03-14: qty 2
  Filled 2023-03-14 (×3): qty 1

## 2023-03-14 MED ORDER — ACETAMINOPHEN 160 MG/5ML PO SOLN: 650.0000 mg | ORAL | Status: DC | PRN

## 2023-03-14 MED ORDER — SODIUM CHLORIDE 0.9 % IV SOLN
2.0000 g | INTRAVENOUS | Status: DC
  Administered 2023-03-14 – 2023-03-16 (×3): 2 g via INTRAVENOUS
  Filled 2023-03-14 (×3): qty 20

## 2023-03-14 MED ORDER — ACETAMINOPHEN 325 MG PO TABS: 650.0000 mg | ORAL_TABLET | ORAL | Status: DC | PRN

## 2023-03-14 MED ORDER — ASPIRIN 325 MG PO TABS
325.0000 mg | ORAL_TABLET | Freq: Every day | ORAL | Status: DC
  Administered 2023-03-14 – 2023-03-15 (×2): 325 mg via ORAL
  Filled 2023-03-14 (×2): qty 1

## 2023-03-14 MED ORDER — STROKE: EARLY STAGES OF RECOVERY BOOK
Freq: Once | Status: AC
  Filled 2023-03-14: qty 1

## 2023-03-14 MED ORDER — CARVEDILOL 12.5 MG PO TABS
12.5000 mg | ORAL_TABLET | Freq: Two times a day (BID) | ORAL | Status: DC
  Administered 2023-03-14 – 2023-03-19 (×11): 12.5 mg via ORAL
  Filled 2023-03-14 (×11): qty 1

## 2023-03-14 MED ORDER — METRONIDAZOLE 500 MG/100ML IV SOLN
500.0000 mg | Freq: Two times a day (BID) | INTRAVENOUS | Status: DC
  Administered 2023-03-14 – 2023-03-15 (×4): 500 mg via INTRAVENOUS
  Filled 2023-03-14 (×4): qty 100

## 2023-03-14 MED ORDER — PANTOPRAZOLE SODIUM 40 MG PO TBEC
40.0000 mg | DELAYED_RELEASE_TABLET | Freq: Every day | ORAL | Status: DC
Start: 2023-03-14 — End: 2023-03-19
  Administered 2023-03-14 – 2023-03-19 (×6): 40 mg via ORAL
  Filled 2023-03-14 (×6): qty 1

## 2023-03-14 MED ORDER — PERFLUTREN LIPID MICROSPHERE
1.0000 mL | INTRAVENOUS | Status: AC | PRN
  Administered 2023-03-14: 5 mL via INTRAVENOUS

## 2023-03-14 MED ORDER — ENOXAPARIN SODIUM 40 MG/0.4ML IJ SOSY
40.0000 mg | PREFILLED_SYRINGE | INTRAMUSCULAR | Status: DC
Start: 2023-03-14 — End: 2023-03-14

## 2023-03-14 MED ORDER — SUCRALFATE 1 GM/10ML PO SUSP
1.0000 g | Freq: Three times a day (TID) | ORAL | Status: DC
  Administered 2023-03-14 – 2023-03-19 (×20): 1 g via ORAL
  Filled 2023-03-14 (×24): qty 10

## 2023-03-14 MED ORDER — HYDRALAZINE HCL 20 MG/ML IJ SOLN: 10.0000 mg | Freq: Four times a day (QID) | INTRAMUSCULAR | Status: DC | PRN

## 2023-03-14 MED ORDER — SODIUM CHLORIDE 0.9 % IV SOLN: INTRAVENOUS | Status: DC

## 2023-03-14 MED ORDER — ACETAMINOPHEN 650 MG RE SUPP: 650.0000 mg | RECTAL | Status: DC | PRN

## 2023-03-14 NOTE — Progress Notes (Signed)
PT Cancellation Note  Patient Details Name: Brandon Harris MRN: [id number] DOB: May 25, 1928   Cancelled Treatment:    Reason Eval/Treat Not Completed: Other (comment); Heparin started ~ 0400. Will hold PT eval until after 24 hour window.  Per chart pt is awaiting transfer to [facility] to be seen by neurology. Will see as early as schedule allows -either campus -[facility]   [facility] 03/14/2023, 5:21 PM

## 2023-03-14 NOTE — ED Notes (Signed)
Went to MRI heparin needs to be respiked and hung on our pump when he comes back

## 2023-03-14 NOTE — Progress Notes (Signed)
  Echocardiogram 2D Echocardiogram has been performed.  Leda Roys RDCS 03/14/2023, 11:52 AM

## 2023-03-14 NOTE — Progress Notes (Signed)
PHARMACY - ANTICOAGULATION CONSULT NOTE  Pharmacy Consult for Heparin Indication: atrial fibrillation  Allergies  Allergen Reactions   Amlodipine Besylate     Other reaction(s): 10 mg leads to swelling; tolerates 5 mg   Mirabegron     Other reaction(s): Unknown   Vibegron     Other reaction(s): mental status changes    Patient Measurements:   Heparin Dosing Weight: TBW  Vital Signs: Temp: 98.7 F (37.1 C) (02/02 1200) BP: 139/80 (02/02 1400) Pulse Rate: 100 (02/02 1400)  Labs: Recent Labs    03/13/23 1207 03/14/23 0323 03/14/23 0506 03/14/23 1440  HGB 9.7* 9.9*  --   --   HCT 30.2* 30.3*  --   --   PLT 294 296  --   --   HEPARINUNFRC  --   --   --  0.34  CREATININE 2.26* 2.08*  --   --   TROPONINIHS  --  15 18*  --     Estimated Creatinine Clearance: 26.3 mL/min (A) (by C-G formula based on SCr of 2.08 mg/dL (H)).   Medical History: Past Medical History:  Diagnosis Date   Arthritis    Carotid arterial disease (HCC)    Chronic kidney disease (CKD), stage IV (severe) (HCC)    Diverticulosis    colonic diverticulosis   Gallstones    GERD (gastroesophageal reflux disease)    Hypertension    Myocardial infarction [facility])    Prostate cancer (HCC)    Status post coronary artery bypass grafting     intraoperative cholangiogram   Stroke [facility])    patient reports no deficits    Urinary incontinence    bladder leakage - " patient wears a pad"    Medications:  Infusions:   sodium chloride 40 mL/hr at 03/14/23 1310   cefTRIAXone (ROCEPHIN)  IV Stopped (03/14/23 0210)   heparin 1,100 Units/hr (03/14/23 1310)   metronidazole Stopped (03/14/23 1036)    Assessment: 89 yo M presenting with acute CVA (small right frontoparietal infarct per MRI, no bleeding per CT) and new onset Afib.  OK to start heparin per neurologist.  Will dose conservatively given acute stroke (no bolus, low goal 0.3-0.5).    Today, 03/14/23 HL is 0.34, therapeutic Hgb 9.9, plt 296  Scr  2.26, CrCl 27 ml/min  No line or bleeding issues per RN    Goal of Therapy:  Heparin level 0.3-0.5 Monitor platelets by anticoagulation protocol: Yes   Plan:  Continue  IV heparin infusion at 1100 units/hr.  No bolus.  Obtain 8h confirmatory heparin level Daily heparin level & CBC while on heparin F/U long-term anticoagulation plans  Adalberto Cole, PharmD, BCPS 03/14/2023 3:33 PM

## 2023-03-14 NOTE — Consult Note (Signed)
[facility] Gastroenterology Consult  Referring Provider: No ref. provider found Primary Care Physician:  Tally Joe, MD Primary Gastroenterologist: [facility]. Danise Edge Reason  Reason for Consultation: Abdominal pain  SUBJECTIVE:   HPI: Brandon Harris is a 89 y.o. male with past medical history significant for coronary artery disease status post CABG, hypertension, chronic kidney disease, mesenteric artery stenosis.  Presented to the emergency department on 03/09/2023 for abdominal pain, CT scan of chest abdomen and pelvis showed circumferential thickening at the anorectal junction to see under distention versus proctitis, diverticulosis, right upper quadrant.  He was discharged to home on Augmentin and Zofran.  His daughter, Liborio Nixon, is present during my evaluation of patient today.  Noted that he was having some abdominal discomfort, nausea and vomiting (brown-colored emesis) prompting above visit.  He was having some difficulty with swallowing.  His abdominal pain, nausea and vomiting are completely resolved at present.  No changes in bowel habits.  No blood per rectum.  He presents to the hospital currently for left-sided and has been found to have small acute right frontal parietal infarct.  He is undergoing further MRI imaging.  He was also found to have new onset atrial fibrillation has been started on heparin GGT.  Colonoscopy 12/30/2010 for personal history of colon polyp (Dr. Laural Benes) showed poor prep, cecum tubular adenoma x 3 status post cold snare polypectomy, transverse colon tubular adenoma x 2 status post cold snare polypectomy.  Upper GI series 01/10/2018 for dysphagia showed moderate hiatal hernia, esophageal dysmotility, lower esophageal mucosal ring (barium passed easily).  Past Medical History:  Diagnosis Date   Arthritis    Carotid arterial disease (HCC)    Chronic kidney disease (CKD), stage IV (severe) (HCC)    Diverticulosis    colonic diverticulosis   Gallstones     GERD (gastroesophageal reflux disease)    Hypertension    Myocardial infarction [facility])    Prostate cancer (HCC)    Status post coronary artery bypass grafting     intraoperative cholangiogram   Stroke [facility])    patient reports no deficits    Urinary incontinence    bladder leakage - " patient wears a pad"   Past Surgical History:  Procedure Laterality Date   BACK SURGERY     x 4, has titanium in back per patient    CHOLECYSTECTOMY, LAPAROSCOPIC     CORONARY ARTERY BYPASS GRAFT  2001   CRYOTHERAPY     Prostate Cancer   LEFT HEART CATH AND CORS/GRAFTS ANGIOGRAPHY N/A 10/27/2017   Procedure: LEFT HEART CATH AND CORS/GRAFTS ANGIOGRAPHY;  Surgeon: Lyn Records, MD;  Location: [facility] INVASIVE CV LAB;  Service: Cardiovascular;  Laterality: N/A;   prostate cancer     ROTATOR CUFF REPAIR     LEFT   TOTAL KNEE ARTHROPLASTY Right 12/18/2014   Procedure: TOTAL RIGHT KNEE ARTHROPLASTY;  Surgeon: Durene Romans, MD;  Location: [facility] ORS;  Service: Orthopedics;  Laterality: Right;   Prior to Admission medications   Medication Sig Start Date End Date Taking? Authorizing Provider  ALPRAZolam Prudy Feeler) 0.25 MG tablet Take 0.25 mg by mouth at bedtime as needed for anxiety or sleep. TAKE ONE TABLET AT NIGHT 02/13/13   [provider]  amitriptyline (ELAVIL) 10 MG tablet Take 20 mg by mouth in the morning and at bedtime.  08/08/17   [provider]  amLODipine (NORVASC) 10 MG tablet Take 1 tablet (10 mg total) by mouth every morning. 02/02/23   Nahser, Deloris Ping, MD  amoxicillin-clavulanate (  AUGMENTIN) 875-125 MG tablet Take 1 tablet by mouth every 12 (twelve) hours. 03/09/23   Terrilee Files, MD  aspirin EC 81 MG tablet Take 1 tablet (81 mg total) by mouth daily. Patient not taking: Reported on 02/16/2023 06/03/16   Lyn Records, MD  atorvastatin (LIPITOR) 80 MG tablet Take 80 mg by mouth every evening.     [provider]  carvedilol (COREG) 12.5 MG tablet Take one tablet by mouth  every morning and take one tablet by mouth everyday at bedtime 02/02/23   Nahser, Deloris Ping, MD  furosemide (LASIX) 20 MG tablet TAKE 1 TABLET BY MOUTH WEEKLY ON MONDAY, [facility] & FRIDAY    [provider]  hydrALAZINE (APRESOLINE) 25 MG tablet Take 1 tablet (25 mg total) by mouth 3 (three) times daily. To be taken with (50 mg) tablet three times daily. 01/28/23   Nahser, Deloris Ping, MD  hydrALAZINE (APRESOLINE) 50 MG tablet Take one tablet by mouth every morning and take one tablet by mouth at noon and take one tablet by mouth everyday at bedtime. 01/28/23   Nahser, Deloris Ping, MD  isosorbide mononitrate (IMDUR) 60 MG 24 hr tablet Take 1 tablet (60 mg total) by mouth every morning. 01/28/23   Nahser, Deloris Ping, MD  Melatonin 10 MG TABS Take 10 mg by mouth daily.    [provider]  mirtazapine (REMERON) 15 MG tablet Take 15 mg by mouth at bedtime. 11/09/19   [provider]  nitroGLYCERIN (NITROSTAT) 0.4 MG SL tablet Dissolve 1 tab under tongue as needed for chest pain. May repeat every 5 minutes x 2 doses. If no relief call 9-1-1. 02/17/22   Lyn Records, MD  ondansetron (ZOFRAN) 4 MG tablet Take 1 tablet (4 mg total) by mouth every 8 (eight) hours as needed for nausea or vomiting. 03/09/23   Terrilee Files, MD  pantoprazole (PROTONIX) 40 MG tablet TAKE ONE TABLET BY MOUTH EVERY MORNING 06/01/19   Lyn Records, MD  tamsulosin (FLOMAX) 0.4 MG CAPS capsule Take 0.8 mg by mouth daily. Take two (2) capsules (0.8 mg) by mouth daily.  Patient not taking: Reported on 02/16/2023 08/08/17   [provider]  traMADol (ULTRAM) 50 MG tablet Take 50 mg by mouth every 6 (six) hours as needed for moderate pain or severe pain. Patient not taking: Reported on 02/16/2023 06/07/20   [provider]  zolpidem (AMBIEN) 5 MG tablet Take 5 mg by mouth at bedtime. 09/01/21   [provider]   Current Facility-Administered Medications  Medication Dose Route Frequency Provider  Last Rate Last Admin   [START ON 03/15/2023]  stroke: early stages of recovery book   Does not apply Once Eduard Clos, MD       0.9 %  sodium chloride infusion   Intravenous Continuous Eduard Clos, MD 40 mL/hr at 03/14/23 0140 New Bag at 03/14/23 0140   acetaminophen (TYLENOL) tablet 650 mg  650 mg Oral Q4H PRN Eduard Clos, MD       Or   acetaminophen (TYLENOL) 160 MG/5ML solution 650 mg  650 mg Per Tube Q4H PRN Eduard Clos, MD       Or   acetaminophen (TYLENOL) suppository 650 mg  650 mg Rectal Q4H PRN Eduard Clos, MD       aspirin suppository 300 mg  300 mg Rectal Daily Eduard Clos, MD       Or   aspirin tablet 325 mg  325 mg Oral Daily Eduard Clos, MD   325 mg at 03/14/23 1610   atorvastatin (LIPITOR) tablet 80 mg  80 mg Oral QPM Eduard Clos, MD       carvedilol (COREG) tablet 12.5 mg  12.5 mg Oral BID WC Eduard Clos, MD   12.5 mg at 03/14/23 0947   cefTRIAXone (ROCEPHIN) 2 g in sodium chloride 0.9 % 100 mL IVPB  2 g Intravenous Q24H Eduard Clos, MD   Stopped at 03/14/23 0210   heparin ADULT infusion 100 units/mL (25000 units/2109mL)  1,100 Units/hr Intravenous Continuous Phylliss Blakes, RPH 11 mL/hr at 03/14/23 0501 1,100 Units/hr at 03/14/23 0501   hydrALAZINE (APRESOLINE) injection 10 mg  10 mg Intravenous Q6H PRN Eduard Clos, MD       metroNIDAZOLE (FLAGYL) IVPB 500 mg  500 mg Intravenous BID Eduard Clos, MD 100 mL/hr at 03/14/23 0947 500 mg at 03/14/23 9604   Current Outpatient Medications  Medication Sig Dispense Refill   ALPRAZolam (XANAX) 0.25 MG tablet Take 0.25 mg by mouth at bedtime as needed for anxiety or sleep. TAKE ONE TABLET AT NIGHT     amitriptyline (ELAVIL) 10 MG tablet Take 20 mg by mouth in the morning and at bedtime.   0   amLODipine (NORVASC) 10 MG tablet Take 1 tablet (10 mg total) by mouth every morning. 90 tablet 0   amoxicillin-clavulanate (AUGMENTIN)  875-125 MG tablet Take 1 tablet by mouth every 12 (twelve) hours. 14 tablet 0   aspirin EC 81 MG tablet Take 1 tablet (81 mg total) by mouth daily. (Patient not taking: Reported on 02/16/2023) 90 tablet 3   atorvastatin (LIPITOR) 80 MG tablet Take 80 mg by mouth every evening.      carvedilol (COREG) 12.5 MG tablet Take one tablet by mouth every morning and take one tablet by mouth everyday at bedtime 180 tablet 0   furosemide (LASIX) 20 MG tablet TAKE 1 TABLET BY MOUTH WEEKLY ON MONDAY, WEDNESDAY & FRIDAY     hydrALAZINE (APRESOLINE) 25 MG tablet Take 1 tablet (25 mg total) by mouth 3 (three) times daily. To be taken with (50 mg) tablet three times daily. 270 tablet 0   hydrALAZINE (APRESOLINE) 50 MG tablet Take one tablet by mouth every morning and take one tablet by mouth at noon and take one tablet by mouth everyday at bedtime. 270 tablet 0   isosorbide mononitrate (IMDUR) 60 MG 24 hr tablet Take 1 tablet (60 mg total) by mouth every morning. 90 tablet 0   Melatonin 10 MG TABS Take 10 mg by mouth daily.     mirtazapine (REMERON) 15 MG tablet Take 15 mg by mouth at bedtime.     nitroGLYCERIN (NITROSTAT) 0.4 MG SL tablet Dissolve 1 tab under tongue as needed for chest pain. May repeat every 5 minutes x 2 doses. If no relief call 9-1-1. 25 tablet 11   ondansetron (ZOFRAN) 4 MG tablet Take 1 tablet (4 mg total) by mouth every 8 (eight) hours as needed for nausea or vomiting. 15 tablet 0   pantoprazole (PROTONIX) 40 MG tablet TAKE ONE TABLET BY MOUTH EVERY MORNING 90 tablet 0   tamsulosin (FLOMAX) 0.4 MG CAPS capsule Take 0.8 mg by mouth daily. Take two (2) capsules (0.8 mg) by mouth daily.  (Patient not taking: Reported on 02/16/2023)  11   traMADol (ULTRAM) 50 MG tablet Take 50 mg by mouth every 6 (six) hours as needed for moderate pain  or severe pain. (Patient not taking: Reported on 02/16/2023)     zolpidem (AMBIEN) 5 MG tablet Take 5 mg by mouth at bedtime.     Allergies as of 03/13/2023 - Review  Complete 03/13/2023  Allergen Reaction Noted   Amlodipine besylate  04/15/2021   Mirabegron  04/15/2021   Vibegron  04/15/2021   Family History  Problem Relation Age of Onset   Heart failure Mother    Social History   Socioeconomic History   Marital status: Widowed    Spouse name: Not on file   Number of children: Not on file   Years of education: Not on file   Highest education level: Not on file  Occupational History   Not on file  Tobacco Use   Smoking status: Never   Smokeless tobacco: Never  Vaping Use   Vaping status: Never Used  Substance and Sexual Activity   Alcohol use: No   Drug use: No   Sexual activity: Not Currently  Other Topics Concern   Not on file  Social History Narrative   Not on file   Social Drivers of Health   Financial Resource Strain: Not on file  Food Insecurity: Not on file  Transportation Needs: Not on file  Physical Activity: Not on file  Stress: Not on file  Social Connections: Not on file  Intimate Partner Violence: Not on file   Review of Systems:  Review of Systems  Respiratory:  Negative for shortness of breath.   Cardiovascular:  Negative for chest pain.  Gastrointestinal:  Negative for abdominal pain, blood in stool, constipation, diarrhea, nausea and vomiting.  Neurological:  Positive for weakness.    OBJECTIVE:   Temp:  [97.5 F (36.4 C)-97.7 F (36.5 C)] 97.7 F (36.5 C) (02/02 0830) Pulse Rate:  [35-118] 101 (02/02 0930) Resp:  [15-25] 18 (02/02 0930) BP: (119-183)/(61-102) 135/81 (02/02 0930) SpO2:  [91 %-100 %] 100 % (02/02 0930)   Physical Exam Constitutional:      General: He is not in acute distress.    Appearance: He is not ill-appearing, toxic-appearing or diaphoretic.  Cardiovascular:     Rate and Rhythm: Normal rate. Rhythm irregular.  Pulmonary:     Effort: No respiratory distress.     Breath sounds: Normal breath sounds.  Abdominal:     General: Bowel sounds are normal. There is no distension.      Palpations: Abdomen is soft.     Tenderness: There is no abdominal tenderness. There is no guarding.  Musculoskeletal:     Right lower leg: No edema.     Left lower leg: No edema.  Skin:    General: Skin is warm and dry.  Neurological:     Mental Status: He is alert.     Labs: Recent Labs    03/13/23 1207 03/14/23 0323  WBC 7.6 8.3  HGB 9.7* 9.9*  HCT 30.2* 30.3*  PLT 294 296   BMET Recent Labs    03/13/23 1207 03/14/23 0323  NA 133*  --   K 4.5  --   CL 100  --   CO2 22  --   GLUCOSE 117*  --   BUN 20  --   CREATININE 2.26* 2.08*  CALCIUM 8.7*  --    LFT No results for input(s): "PROT", "ALBUMIN", "AST", "ALT", "ALKPHOS", "BILITOT", "BILIDIR", "IBILI" in the last 72 hours. PT/INR No results for input(s): "LABPROT", "INR" in the last 72 hours.  Diagnostic imaging: MR BRAIN WO CONTRAST Result  Date: 03/13/2023 CLINICAL DATA:  Neuro deficit, acute, stroke suspected. Left upper extremity numbness. Difficulty walking. EXAM: MRI HEAD WITHOUT CONTRAST TECHNIQUE: Multiplanar, multiecho pulse sequences of the brain and surrounding structures were obtained without intravenous contrast. COMPARISON:  Head CT 03/13/2023 FINDINGS: Brain: Scattered small acute cortical and subcortical infarcts are present in the posterior right frontal and anterior right parietal lobes (MCA territory). Several chronic microhemorrhages are noted in the cerebral and cerebellar hemispheres. Patchy to confluent T2 hyperintensities in the cerebral white matter bilaterally are nonspecific but compatible with moderate to severe chronic small vessel ischemic disease. Small chronic infarcts are present in the right occipital lobe, both cerebellar hemispheres, right pons, thalami, and bilateral basal ganglia. There is moderate cerebral and cerebellar atrophy. No mass, midline shift, or extra-axial fluid collection is identified. Vascular: Absent flow void in the distal left vertebral artery. Skull and upper  cervical spine: Unremarkable bone marrow signal. Sinuses/Orbits: Bilateral cataract extraction. Opacification of a single posterior left ethmoid air cell. No significant mastoid fluid. Other: None. IMPRESSION: 1. Small acute right frontoparietal infarcts. 2. Moderate to severe chronic small vessel ischemic disease with numerous chronic infarcts. 3. 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 Electronically Signed   By: Sebastian Ache M.D.   On: 03/13/2023 17:31   CT Head Wo Contrast Result Date: 03/13/2023 CLINICAL DATA:  Neuro deficit, acute, stroke suspected EXAM: CT HEAD WITHOUT CONTRAST TECHNIQUE: Contiguous axial images were obtained from the base of the skull through the vertex without intravenous contrast. RADIATION DOSE REDUCTION: This exam was performed according to the departmental dose-optimization program which includes automated exposure control, adjustment of the mA and/or kV according to patient size and/or use of iterative reconstruction technique. COMPARISON:  08/05/2022 FINDINGS: Brain: No evidence of acute infarction, hemorrhage, hydrocephalus, extra-axial collection or mass lesion/mass effect. Patchy low-density changes within the periventricular and subcortical white matter most compatible with chronic microvascular ischemic change. Moderate diffuse cerebral volume loss. Vascular: Atherosclerotic calcifications involving the large vessels of the skull base. No unexpected hyperdense vessel. Skull: Normal. Negative for fracture or focal lesion. Sinuses/Orbits: No acute finding. Other: None. IMPRESSION: 1. No acute intracranial findings. 2. Chronic microvascular ischemic change and cerebral volume loss. Electronically Signed   By: Duanne Guess D.O.   On: 03/13/2023 11:57   IMPRESSION: Abdominal pain, now resolved Dysphagia, now resolved Nausea and vomiting, now resolved History of mesenteric artery stenosis Left-sided weakness, acute right frontoparietal infarcts New onset  atrial fibrillation, on heparin GGT Personal history colon polyps  PLAN: -Start empiric pantoprazole 40 mg p.o. daily -Start empiric sucralfate 1 g p.o. 4 times daily -No plan for endoscopic intervention -If symptoms of abdominal pain or dysphagia recur, can follow-up with [facility] in the outpatient setting -[facility] will sign off and be available as needed should questions arise   LOS: 0 days   Liliane Shi, DO [facility]ology

## 2023-03-14 NOTE — Progress Notes (Signed)
PHARMACY - ANTICOAGULATION CONSULT NOTE  Pharmacy Consult for Heparin Indication: atrial fibrillation  Allergies  Allergen Reactions   Amlodipine Besylate     Other reaction(s): 10 mg leads to swelling; tolerates 5 mg   Mirabegron     Other reaction(s): Unknown   Vibegron     Other reaction(s): mental status changes    Patient Measurements:   Heparin Dosing Weight: TBW  Vital Signs: Temp: 97.5 F (36.4 C) (02/02 0335) BP: 139/91 (02/02 0335) Pulse Rate: 102 (02/02 0335)  Labs: Recent Labs    03/13/23 1207  HGB 9.7*  HCT 30.2*  PLT 294  CREATININE 2.26*    Estimated Creatinine Clearance: 24.2 mL/min (A) (by C-G formula based on SCr of 2.26 mg/dL (H)).   Medical History: Past Medical History:  Diagnosis Date   Arthritis    Carotid arterial disease (HCC)    Chronic kidney disease (CKD), stage IV (severe) (HCC)    Diverticulosis    colonic diverticulosis   Gallstones    GERD (gastroesophageal reflux disease)    Hypertension    Myocardial infarction [facility])    Prostate cancer (HCC)    Status post coronary artery bypass grafting     intraoperative cholangiogram   Stroke [facility])    patient reports no deficits    Urinary incontinence    bladder leakage - " patient wears a pad"    Medications:  Infusions:   sodium chloride 40 mL/hr at 03/14/23 0140   cefTRIAXone (ROCEPHIN)  IV Stopped (03/14/23 0210)   metronidazole Stopped (03/14/23 5956)    Assessment: 89 yo M presenting with acute CVA (small right frontoparietal infarct per MRI, no bleeding per CT) and new onset Afib.  OK to start heparin per neurologist.  Will dose conservatively given acute stroke (no bolus, low goal 0.3-0.5). Baseline labs- Hg 9.9- low/stable, pltc 296-WNL, Scr elevated at 2.26 (patient's baseline ~2.0)  Goal of Therapy:  Heparin level 0.3-0.5 Monitor platelets by anticoagulation protocol: Yes   Plan:  Start IV heparin infusion at 1100 units/hr.  No bolus.  Check 8h heparin  level Daily heparin level & CBC while on heparin F/U long-term anticoagulation plans  Junita Push PharmD 03/14/2023,3:47 AM

## 2023-03-14 NOTE — H&P (Addendum)
History and Physical    Brandon Harris:[id number] DOB: 1928/12/24 DOA: 03/13/2023  Patient coming from: Home.  Chief Complaint: Left-sided numbness.  HPI: Brandon Harris is a 89 y.o. male with history of CAD status post CABG, carotid artery disease, hypertension, chronic kidney disease stage IV, prior stroke, mesenteric artery stenosis and history of prostate cancer who had come to the ER 4 days ago because of abdominal pain at the time CT chest abdomen pelvis showed features concerning for proctitis was discharged home on Augmentin presents to the ER with complaints of persistent left upper and lower extremity numbness ongoing for last 48 hours.  Denies any weakness of the extremities.  Denies any visual symptoms or difficulty speaking.  ED Course: In the ER patient had MRI of the brain which shows acute right frontotemporoparietal stroke.  Neurologist on-call was consulted.  Plan is to transfer patient to [facility] for further workup.  Patient's EKG shows features concerning for A-fib rate controlled which is new for the patient.  Labs show creatinine of 2.2 hemoglobin 9.7.  UA done 4 days ago showed concerning features for UTI and cultures grew E. coli.  Review of Systems: As per HPI, rest all negative.   Past Medical History:  Diagnosis Date   Arthritis    Carotid arterial disease (HCC)    Chronic kidney disease (CKD), stage IV (severe) (HCC)    Diverticulosis    colonic diverticulosis   Gallstones    GERD (gastroesophageal reflux disease)    Hypertension    Myocardial infarction [facility])    Prostate cancer (HCC)    Status post coronary artery bypass grafting     intraoperative cholangiogram   Stroke [facility])    patient reports no deficits    Urinary incontinence    bladder leakage - " patient wears a pad"    Past Surgical History:  Procedure Laterality Date   BACK SURGERY     x 4, has titanium in back per patient    CHOLECYSTECTOMY, LAPAROSCOPIC     CORONARY ARTERY  BYPASS GRAFT  2001   CRYOTHERAPY     Prostate Cancer   LEFT HEART CATH AND CORS/GRAFTS ANGIOGRAPHY N/A 10/27/2017   Procedure: LEFT HEART CATH AND CORS/GRAFTS ANGIOGRAPHY;  Surgeon: Lyn Records, MD;  Location: [facility] INVASIVE CV LAB;  Service: Cardiovascular;  Laterality: N/A;   prostate cancer     ROTATOR CUFF REPAIR     LEFT   TOTAL KNEE ARTHROPLASTY Right 12/18/2014   Procedure: TOTAL RIGHT KNEE ARTHROPLASTY;  Surgeon: Durene Romans, MD;  Location: [facility] ORS;  Service: Orthopedics;  Laterality: Right;     reports that he has never smoked. He has never used smokeless tobacco. He reports that he does not drink alcohol and does not use drugs.  Allergies  Allergen Reactions   Amlodipine Besylate     Other reaction(s): 10 mg leads to swelling; tolerates 5 mg   Mirabegron     Other reaction(s): Unknown   Vibegron     Other reaction(s): mental status changes    Family History  Problem Relation Age of Onset   Heart failure Mother     Prior to Admission medications   Medication Sig Start Date End Date Taking? Authorizing Provider  ALPRAZolam Prudy Feeler) 0.25 MG tablet Take 0.25 mg by mouth at bedtime as needed for anxiety or sleep. TAKE ONE TABLET AT NIGHT 02/13/13   [provider]  amitriptyline (ELAVIL) 10 MG tablet Take 20 mg by mouth in  the morning and at bedtime.  08/08/17   [provider]  amLODipine (NORVASC) 10 MG tablet Take 1 tablet (10 mg total) by mouth every morning. 02/02/23   Nahser, Deloris Ping, MD  amoxicillin-clavulanate (AUGMENTIN) 875-125 MG tablet Take 1 tablet by mouth every 12 (twelve) hours. 03/09/23   Terrilee Files, MD  aspirin EC 81 MG tablet Take 1 tablet (81 mg total) by mouth daily. Patient not taking: Reported on 02/16/2023 06/03/16   Lyn Records, MD  atorvastatin (LIPITOR) 80 MG tablet Take 80 mg by mouth every evening.     [provider]  carvedilol (COREG) 12.5 MG tablet Take one tablet by mouth every morning and take one tablet by  mouth everyday at bedtime 02/02/23   Nahser, Deloris Ping, MD  furosemide (LASIX) 20 MG tablet TAKE 1 TABLET BY MOUTH WEEKLY ON MONDAY, [facility] & FRIDAY    [provider]  hydrALAZINE (APRESOLINE) 25 MG tablet Take 1 tablet (25 mg total) by mouth 3 (three) times daily. To be taken with (50 mg) tablet three times daily. 01/28/23   Nahser, Deloris Ping, MD  hydrALAZINE (APRESOLINE) 50 MG tablet Take one tablet by mouth every morning and take one tablet by mouth at noon and take one tablet by mouth everyday at bedtime. 01/28/23   Nahser, Deloris Ping, MD  isosorbide mononitrate (IMDUR) 60 MG 24 hr tablet Take 1 tablet (60 mg total) by mouth every morning. 01/28/23   Nahser, Deloris Ping, MD  Melatonin 10 MG TABS Take 10 mg by mouth daily.    [provider]  mirtazapine (REMERON) 15 MG tablet Take 15 mg by mouth at bedtime. 11/09/19   [provider]  nitroGLYCERIN (NITROSTAT) 0.4 MG SL tablet Dissolve 1 tab under tongue as needed for chest pain. May repeat every 5 minutes x 2 doses. If no relief call 9-1-1. 02/17/22   Lyn Records, MD  ondansetron (ZOFRAN) 4 MG tablet Take 1 tablet (4 mg total) by mouth every 8 (eight) hours as needed for nausea or vomiting. 03/09/23   Terrilee Files, MD  pantoprazole (PROTONIX) 40 MG tablet TAKE ONE TABLET BY MOUTH EVERY MORNING 06/01/19   Lyn Records, MD  tamsulosin (FLOMAX) 0.4 MG CAPS capsule Take 0.8 mg by mouth daily. Take two (2) capsules (0.8 mg) by mouth daily.  Patient not taking: Reported on 02/16/2023 08/08/17   [provider]  traMADol (ULTRAM) 50 MG tablet Take 50 mg by mouth every 6 (six) hours as needed for moderate pain or severe pain. Patient not taking: Reported on 02/16/2023 06/07/20   [provider]  zolpidem (AMBIEN) 5 MG tablet Take 5 mg by mouth at bedtime. 09/01/21   [provider]    Physical Exam: Constitutional: Moderately built and nourished. Vitals:   03/13/23 2200 03/13/23 2230 03/13/23 2245   BP: (!) 169/80 (!) 141/87 132/84  Pulse: (!) 105 98 99  Resp:   15  Temp:   97.6 F (36.4 C)  SpO2: 99% 96% 97%   Eyes: Anicteric no pallor. ENMT: No discharge from the ears eyes nose or mouth. Neck: No mass felt.  No neck rigidity. Respiratory: No rhonchi or crepitations. Cardiovascular: S1 S2 heard. Abdomen: Soft nontender bowel sound present. Musculoskeletal: No edema. Skin: No rash. Neurologic: Alert awake oriented to time place and person.  Moves all extremities. Psychiatric: Appears normal.  Normal affect.   Labs on Admission: I have personally reviewed following labs and imaging studies  CBC:  Recent Labs  Lab 03/09/23 0735 03/13/23 1207  WBC 12.5* 7.6  NEUTROABS 9.2* 4.3  HGB 9.3* 9.7*  HCT 28.9* 30.2*  MCV 93.2 94.7  PLT 268 294   Basic Metabolic Panel: Recent Labs  Lab 03/09/23 0735 03/13/23 1207  NA 132* 133*  K 3.5 4.5  CL 96* 100  CO2 25 22  GLUCOSE 134* 117*  BUN 27* 20  CREATININE 2.17* 2.26*  CALCIUM 9.1 8.7*   GFR: Estimated Creatinine Clearance: 24.2 mL/min (A) (by C-G formula based on SCr of 2.26 mg/dL (H)). Liver Function Tests: Recent Labs  Lab 03/09/23 0735  AST 22  ALT 22  ALKPHOS 65  BILITOT 0.8  PROT 6.9  ALBUMIN 3.8   Recent Labs  Lab 03/09/23 0735  LIPASE 51   No results for input(s): "AMMONIA" in the last 168 hours. Coagulation Profile: No results for input(s): "INR", "PROTIME" in the last 168 hours. Cardiac Enzymes: No results for input(s): "CKTOTAL", "CKMB", "CKMBINDEX", "TROPONINI" in the last 168 hours. BNP (last 3 results) No results for input(s): "PROBNP" in the last 8760 hours. HbA1C: No results for input(s): "HGBA1C" in the last 72 hours. CBG: Recent Labs  Lab 03/13/23 1115  GLUCAP 102*   Lipid Profile: No results for input(s): "CHOL", "HDL", "LDLCALC", "TRIG", "CHOLHDL", "LDLDIRECT" in the last 72 hours. Thyroid Function Tests: No results for input(s): "TSH", "T4TOTAL", "FREET4", "T3FREE",  "THYROIDAB" in the last 72 hours. Anemia Panel: No results for input(s): "VITAMINB12", "FOLATE", "FERRITIN", "TIBC", "IRON", "RETICCTPCT" in the last 72 hours. Urine analysis:    Component Value Date/Time   COLORURINE YELLOW 03/13/2023 1207   APPEARANCEUR CLEAR 03/13/2023 1207   LABSPEC 1.020 03/13/2023 1207   PHURINE 5.0 03/13/2023 1207   GLUCOSEU NEGATIVE 03/13/2023 1207   HGBUR NEGATIVE 03/13/2023 1207   BILIRUBINUR NEGATIVE 03/13/2023 1207   KETONESUR NEGATIVE 03/13/2023 1207   PROTEINUR 30 (A) 03/13/2023 1207   UROBILINOGEN 1.0 12/10/2014 0950   NITRITE NEGATIVE 03/13/2023 1207   LEUKOCYTESUR NEGATIVE 03/13/2023 1207   Sepsis Labs: @LABRCNTIP (procalcitonin:4,lacticidven:4) ) Recent Results (from the past 240 hours)  Resp panel by RT-PCR (RSV, Flu A&B, Covid) Anterior Nasal Swab     Status: None   Collection Time: 03/09/23  7:16 AM   Specimen: Anterior Nasal Swab  Result Value Ref Range Status   SARS Coronavirus 2 by RT PCR NEGATIVE NEGATIVE Final    Comment: (NOTE) SARS-CoV-2 target nucleic acids are NOT DETECTED.  The SARS-CoV-2 RNA is generally detectable in upper respiratory specimens during the acute phase of infection. The lowest concentration of SARS-CoV-2 viral copies this assay can detect is 138 copies/mL. A negative result does not preclude SARS-Cov-2 infection and should not be used as the sole basis for treatment or other patient management decisions. A negative result may occur with  improper specimen collection/handling, submission of specimen other than nasopharyngeal swab, presence of viral mutation(s) within the areas targeted by this assay, and inadequate number of viral copies(<138 copies/mL). A negative result must be combined with clinical observations, patient history, and epidemiological information. The expected result is Negative.  Fact Sheet for Patients:  BloggerCourse.com  Fact Sheet for Healthcare Providers:   SeriousBroker.it  This test is no t yet approved or cleared by the [place] FDA and  has been authorized for detection and/or diagnosis of SARS-CoV-2 by FDA under an Emergency Use Authorization (EUA). This EUA will remain  in effect (meaning this test can be used) for the duration of the COVID-19 declaration under Section 564(b)(1)  of the Act, 21 U.S.C.section 360bbb-3(b)(1), unless the authorization is terminated  or revoked sooner.       Influenza A by PCR NEGATIVE NEGATIVE Final   Influenza B by PCR NEGATIVE NEGATIVE Final    Comment: (NOTE) The Xpert Xpress SARS-CoV-2/FLU/RSV plus assay is intended as an aid in the diagnosis of influenza from Nasopharyngeal swab specimens and should not be used as a sole basis for treatment. Nasal washings and aspirates are unacceptable for Xpert Xpress SARS-CoV-2/FLU/RSV testing.  Fact Sheet for Patients: BloggerCourse.com  Fact Sheet for Healthcare Providers: SeriousBroker.it  This test is not yet approved or cleared by the [place] FDA and has been authorized for detection and/or diagnosis of SARS-CoV-2 by FDA under an Emergency Use Authorization (EUA). This EUA will remain in effect (meaning this test can be used) for the duration of the COVID-19 declaration under Section 564(b)(1) of the Act, 21 U.S.C. section 360bbb-3(b)(1), unless the authorization is terminated or revoked.     Resp Syncytial Virus by PCR NEGATIVE NEGATIVE Final    Comment: (NOTE) Fact Sheet for Patients: BloggerCourse.com  Fact Sheet for Healthcare Providers: SeriousBroker.it  This test is not yet approved or cleared by the [place] FDA and has been authorized for detection and/or diagnosis of SARS-CoV-2 by FDA under an Emergency Use Authorization (EUA). This EUA will remain in effect (meaning this test can be used) for  the duration of the COVID-19 declaration under Section 564(b)(1) of the Act, 21 U.S.C. section 360bbb-3(b)(1), unless the authorization is terminated or revoked.  Performed at [facility], [address], [place], [place] [address]   Urine Culture     Status: Abnormal   Collection Time: 03/09/23 11:35 AM   Specimen: Urine, Clean Catch  Result Value Ref Range Status   Specimen Description   Final    URINE, CLEAN CATCH Performed at [facility], [address] [place], [place] [address]    Special Requests   Final    NONE Performed at [facility], [address] [place], [place] [address]    Culture >=100,000 COLONIES/mL ESCHERICHIA COLI (A)  Final   Report Status 03/11/2023 FINAL  Final   Organism ID, Bacteria ESCHERICHIA COLI (A)  Final      Susceptibility   Escherichia coli - MIC*    AMPICILLIN <=2 SENSITIVE Sensitive     CEFAZOLIN <=4 SENSITIVE Sensitive     CEFEPIME <=0.12 SENSITIVE Sensitive     CEFTRIAXONE <=0.25 SENSITIVE Sensitive     CIPROFLOXACIN >=4 RESISTANT Resistant     GENTAMICIN <=1 SENSITIVE Sensitive     IMIPENEM <=0.25 SENSITIVE Sensitive     NITROFURANTOIN <=16 SENSITIVE Sensitive     TRIMETH/SULFA <=20 SENSITIVE Sensitive     AMPICILLIN/SULBACTAM <=2 SENSITIVE Sensitive     PIP/TAZO <=4 SENSITIVE Sensitive ug/mL    * >=100,000 COLONIES/mL ESCHERICHIA COLI     Radiological Exams on Admission: MR BRAIN WO CONTRAST Result Date: 03/13/2023 CLINICAL DATA:  Neuro deficit, acute, stroke suspected. Left upper extremity numbness. Difficulty walking. EXAM: MRI HEAD WITHOUT CONTRAST TECHNIQUE: Multiplanar, multiecho pulse sequences of the brain and surrounding structures were obtained without intravenous contrast. COMPARISON:  Head CT 03/13/2023 FINDINGS: Brain: Scattered small acute cortical and subcortical infarcts are present in the posterior right frontal and anterior right parietal lobes (MCA  territory). Several chronic microhemorrhages are noted in the cerebral and cerebellar hemispheres. Patchy to confluent T2 hyperintensities in the cerebral white matter bilaterally are nonspecific but compatible with  moderate to severe chronic small vessel ischemic disease. Small chronic infarcts are present in the right occipital lobe, both cerebellar hemispheres, right pons, thalami, and bilateral basal ganglia. There is moderate cerebral and cerebellar atrophy. No mass, midline shift, or extra-axial fluid collection is identified. Vascular: Absent flow void in the distal left vertebral artery. Skull and upper cervical spine: Unremarkable bone marrow signal. Sinuses/Orbits: Bilateral cataract extraction. Opacification of a single posterior left ethmoid air cell. No significant mastoid fluid. Other: None. IMPRESSION: 1. Small acute right frontoparietal infarcts. 2. Moderate to severe chronic small vessel ischemic disease with numerous chronic infarcts. 3. 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 Electronically Signed   By: Sebastian Ache M.D.   On: 03/13/2023 17:31   CT Head Wo Contrast Result Date: 03/13/2023 CLINICAL DATA:  Neuro deficit, acute, stroke suspected EXAM: CT HEAD WITHOUT CONTRAST TECHNIQUE: Contiguous axial images were obtained from the base of the skull through the vertex without intravenous contrast. RADIATION DOSE REDUCTION: This exam was performed according to the departmental dose-optimization program which includes automated exposure control, adjustment of the mA and/or kV according to patient size and/or use of iterative reconstruction technique. COMPARISON:  08/05/2022 FINDINGS: Brain: No evidence of acute infarction, hemorrhage, hydrocephalus, extra-axial collection or mass lesion/mass effect. Patchy low-density changes within the periventricular and subcortical white matter most compatible with chronic microvascular ischemic change. Moderate diffuse cerebral volume loss.  Vascular: Atherosclerotic calcifications involving the large vessels of the skull base. No unexpected hyperdense vessel. Skull: Normal. Negative for fracture or focal lesion. Sinuses/Orbits: No acute finding. Other: None. IMPRESSION: 1. No acute intracranial findings. 2. Chronic microvascular ischemic change and cerebral volume loss. Electronically Signed   By: Duanne Guess D.O.   On: 03/13/2023 11:57    EKG: Independently reviewed.  A-fib rate controlled.  Assessment/Plan Principal Problem:   Acute CVA (cerebrovascular accident) [facility]) Active Problems:   Essential hypertension   Hyperlipidemia   CKD (chronic kidney disease) stage 4, GFR 15-29 ml/min (HCC)   Gastroesophageal reflux disease   Chronic anemia   UTI (urinary tract infection)   Proctitis    Acute CVA -      neurology has been notified.  Plan is to get MR angio head and neck 2D echo lipid panel hemoglobin A1c swallow evaluation neurochecks presently on aspirin.  Given that patient likely has A-fib will await anticoagulation recommendations from neurology.  Addendum -   discussed with neurologist Dr. Derry Lory advised okay to start heparin for A-fib. Atrial fibrillation new onset rate controlled.  Continue Coreg for rate control.  Will await neurology recommendations for anticoagulation.  Check TSH 2D echo. Abdominal pain -patient recently has been having some abdominal discomfort on eating.  Recent CT scan showed proctitis.  Per report patient has history of mesenteric artery stenosis.  Will consult [facility].  Continue antibiotics. UTI with E. coli in the urine cultures.  On ceftriaxone. CAD status post CABG continue statins beta-blockers and antiplatelet agents. Chronic kidney disease stage IV creatinine around baseline. Anemia likely from renal disease follow CBC check anemia panel. History of prostate cancer. Hypertension takes Coreg hydralazine and amlodipine.  Allow for permissive hypertension for now.  Continue  Coreg.  Given that patient has acute CVA with new onset A-fib will need close monitoring and more than 2 midnight stay and inpatient status.   DVT prophylaxis: Heparin infusion. Code Status: Full code. Family Communication: Unable to reach patient's daughter. Disposition Plan: Monitored bed. Consults called: Neurology.  Requested [facility] consult. Admission  status: Inpatient.

## 2023-03-14 NOTE — Consult Note (Signed)
NEUROLOGY CONSULT NOTE   Date of service: March 14, 2023 Patient Name: Brandon Harris MRN:  [id number] DOB:  [birth year] Chief Complaint: "stroke" Requesting Provider: Alwyn Ren, MD  History of Present Illness  Brandon Harris is a 89 y.o. male with hx of GERD, MI, CAD status post CABG, hypertension CKD 4 who presents to the ED with left upper extremity left lower extremity numbness and trouble walking. MRI brain without contrast demonstrated small acute right frontoparietal strokes.  He denies any complaints at this time and feels like his left upper extremity and left lower extremity are pretty much back to his baseline.  EKG in the ED with new onset Afibb, no prior hx of Afibb.  LKW: 03/13/23 at 0300 Modified rankin score: 2-Slight disability-UNABLE to perform all activities but does not need assistance IV Thrombolysis: not offered, outside window EVT: not offered, low suspicion for LVO  NIHSS components Score: Comment  1a Level of Conscious 0[x]  1[]  2[]  3[]      1b LOC Questions 0[x]  1[]  2[]       1c LOC Commands 0[x]  1[]  2[]       2 Best Gaze 0[x]  1[]  2[]       3 Visual 0[x]  1[]  2[]  3[]      4 Facial Palsy 0[x]  1[]  2[]  3[]      5a Motor Arm - left 0[x]  1[]  2[]  3[]  4[]  UN[]    5b Motor Arm - Right 0[x]  1[]  2[]  3[]  4[]  UN[]    6a Motor Leg - Left 0[x]  1[]  2[]  3[]  4[]  UN[]    6b Motor Leg - Right 0[x]  1[]  2[]  3[]  4[]  UN[]    7 Limb Ataxia 0[x]  1[]  2[]  3[]  UN[]     8 Sensory 0[x]  1[]  2[]  UN[]      9 Best Language 0[x]  1[]  2[]  3[]      10 Dysarthria 0[x]  1[]  2[]  UN[]      11 Extinct. and Inattention 0[x]  1[]  2[]       TOTAL: 0      ROS  Comprehensive ROS performed and pertinent positives documented in HPI   Past History   Past Medical History:  Diagnosis Date   Arthritis    Carotid arterial disease (HCC)    Chronic kidney disease (CKD), stage IV (severe) (HCC)    Diverticulosis    colonic diverticulosis   Gallstones    GERD (gastroesophageal reflux disease)     Hypertension    Myocardial infarction [facility])    Prostate cancer (HCC)    Status post coronary artery bypass grafting     intraoperative cholangiogram   Stroke [facility])    patient reports no deficits    Urinary incontinence    bladder leakage - " patient wears a pad"    Past Surgical History:  Procedure Laterality Date   BACK SURGERY     x 4, has titanium in back per patient    CHOLECYSTECTOMY, LAPAROSCOPIC     CORONARY ARTERY BYPASS GRAFT  2001   CRYOTHERAPY     Prostate Cancer   LEFT HEART CATH AND CORS/GRAFTS ANGIOGRAPHY N/A 10/27/2017   Procedure: LEFT HEART CATH AND CORS/GRAFTS ANGIOGRAPHY;  Surgeon: Lyn Records, MD;  Location: [facility] INVASIVE CV LAB;  Service: Cardiovascular;  Laterality: N/A;   prostate cancer     ROTATOR CUFF REPAIR     LEFT   TOTAL KNEE ARTHROPLASTY Right 12/18/2014   Procedure: TOTAL RIGHT KNEE ARTHROPLASTY;  Surgeon: Durene Romans, MD;  Location: [facility] ORS;  Service: Orthopedics;  Laterality: Right;    Family History: Family  History  Problem Relation Age of Onset   Heart failure Mother     Social History  reports that he has never smoked. He has never used smokeless tobacco. He reports that he does not drink alcohol and does not use drugs.  Allergies  Allergen Reactions   Amlodipine Besylate     Other reaction(s): 10 mg leads to swelling; tolerates 5 mg   Mirabegron     Other reaction(s): Unknown   Vibegron     Other reaction(s): mental status changes    Medications   Current Facility-Administered Medications:    [START ON 03/15/2023]  stroke: early stages of recovery book, , Does not apply, Once, Eduard Clos, MD   0.9 %  sodium chloride infusion, , Intravenous, Continuous, Eduard Clos, MD, Last Rate: 40 mL/hr at 03/14/23 1310, Rate Verify at 03/14/23 1310   acetaminophen (TYLENOL) tablet 650 mg, 650 mg, Oral, Q4H PRN **OR** acetaminophen (TYLENOL) 160 MG/5ML solution 650 mg, 650 mg, Per Tube, Q4H PRN **OR** acetaminophen (TYLENOL)  suppository 650 mg, 650 mg, Rectal, Q4H PRN, Eduard Clos, MD   aspirin suppository 300 mg, 300 mg, Rectal, Daily **OR** aspirin tablet 325 mg, 325 mg, Oral, Daily, Eduard Clos, MD, 325 mg at 03/14/23 0947   atorvastatin (LIPITOR) tablet 80 mg, 80 mg, Oral, QPM, Eduard Clos, MD, 80 mg at 03/14/23 1802   carvedilol (COREG) tablet 12.5 mg, 12.5 mg, Oral, BID WC, Eduard Clos, MD, 12.5 mg at 03/14/23 1802   cefTRIAXone (ROCEPHIN) 2 g in sodium chloride 0.9 % 100 mL IVPB, 2 g, Intravenous, Q24H, Eduard Clos, MD, Stopped at 03/14/23 0210   heparin ADULT infusion 100 units/mL (25000 units/220mL), 1,100 Units/hr, Intravenous, Continuous, Phylliss Blakes, [facility], Last Rate: 11 mL/hr at 03/14/23 1531, 1,100 Units/hr at 03/14/23 1531   hydrALAZINE (APRESOLINE) injection 10 mg, 10 mg, Intravenous, Q6H PRN, Eduard Clos, MD   metroNIDAZOLE (FLAGYL) IVPB 500 mg, 500 mg, Intravenous, BID, Eduard Clos, MD, Last Rate: 100 mL/hr at 03/14/23 2216, 500 mg at 03/14/23 2216   pantoprazole (PROTONIX) EC tablet 40 mg, 40 mg, Oral, Daily, Liliane Shi H, DO, 40 mg at 03/14/23 1502   sucralfate (CARAFATE) 1 GM/10ML suspension 1 g, 1 g, Oral, TID WC & HS, Liliane Shi H, DO, 1 g at 03/14/23 2216  Vitals   Vitals:   03/14/23 1400 03/14/23 1430 03/14/23 1530 03/14/23 2021  BP: 139/80 130/82 130/82 (!) 153/71  Pulse: 100 87 87 91  Resp: 18  18 18   Temp:   98.4 F (36.9 C) 97.7 F (36.5 C)  TempSrc:    Oral  SpO2: 99% 98% 98% 97%    There is no height or weight on file to calculate BMI.  Physical Exam   General: Laying comfortably in bed; in no acute distress.  HENT: Normal oropharynx and mucosa. Normal external appearance of ears and nose.  Neck: Supple, no pain or tenderness  CV: No JVD. No peripheral edema.  Pulmonary: Symmetric Chest rise. Normal respiratory effort.  Abdomen: Soft to touch, non-tender.  Ext: No cyanosis, edema, or deformity   Skin: No rash. Normal palpation of skin.   Musculoskeletal: Normal digits and nails by inspection. No clubbing.   Neurologic Examination  Mental status/Cognition: Alert, oriented to self, place, month and year, good attention.  Speech/language: Fluent, comprehension intact, object naming intact, repetition intact.  Cranial nerves:   CN II Pupils equal and reactive to light, no VF deficits  CN III,IV,VI EOM intact, no gaze preference or deviation, no nystagmus    CN V normal sensation in V1, V2, and V3 segments bilaterally    CN VII no asymmetry, no nasolabial fold flattening    CN VIII normal hearing to speech    CN IX & X normal palatal elevation, no uvular deviation    CN XI 5/5 head turn and 5/5 shoulder shrug bilaterally    CN XII midline tongue protrusion    Motor:  Muscle bulk: normal, tone normal, pronator drift none tremor none Mvmt Root Nerve  Muscle Right Left Comments  SA C5/6 Ax Deltoid 5 5   EF C5/6 Mc Biceps 5 5   EE C6/7/8 Rad Triceps 5 5   WF C6/7 Med FCR     WE C7/8 PIN ECU     F Ab C8/T1 U ADM/FDI 5 5   HF L1/2/3 Fem Illopsoas 5 5   KE L2/3/4 Fem Quad 5 5   DF L4/5 D Peron Tib Ant 5 5   PF S1/2 Tibial Grc/Sol 5 5    Sensation:  Light touch Intact throughout   Pin prick    Temperature    Vibration   Proprioception    Coordination/Complex Motor:  - Finger to Nose intact BL - Heel to shin intact BL - Rapid alternating movement are slowed throughout - Gait: deferred.  Labs/Imaging/Neurodiagnostic studies   CBC:  Recent Labs  Lab 03/23/23 0735 03/13/23 1207 03/14/23 0323  WBC 12.5* 7.6 8.3  NEUTROABS 9.2* 4.3  --   HGB 9.3* 9.7* 9.9*  HCT 28.9* 30.2* 30.3*  MCV 93.2 94.7 93.5  PLT 268 294 296   Basic Metabolic Panel:  Lab Results  Component Value Date   NA 133 (L) 03/13/2023   K 4.5 03/13/2023   CO2 22 03/13/2023   GLUCOSE 117 (H) 03/13/2023   BUN 20 03/13/2023   CREATININE 2.08 (H) 03/14/2023   CALCIUM 8.7 (L) 03/13/2023   GFRNONAA  29 (L) 03/14/2023   GFRAA 32 (L) 07/14/2019   Lipid Panel:  Lab Results  Component Value Date   LDLCALC 35 03/14/2023   HgbA1c:  Lab Results  Component Value Date   HGBA1C 5.5 03/14/2023   Urine Drug Screen: No results found for: "LABOPIA", "COCAINSCRNUR", "LABBENZ", "AMPHETMU", "THCU", "LABBARB"  Alcohol Level No results found for: "ETH" INR  Lab Results  Component Value Date   INR 1.02 12/10/2014   APTT  Lab Results  Component Value Date   APTT 30 12/10/2014   AED levels: No results found for: "PHENYTOIN", "ZONISAMIDE", "LAMOTRIGINE", "LEVETIRACETA"  CT Head without contrast(Personally reviewed): CTH was negative for a large hypodensity concerning for a large territory infarct or hyperdensity concerning for an ICH  MR Angio head without contrast(Personally reviewed):: No LVO  Carotid Duplex BL: pending  MRI Brain(Personally reviewed): 1. Small acute right frontoparietal infarcts.  ASSESSMENT   Brandon Harris is a 89 y.o. male with hx of GERD, MI, CAD status post CABG, hypertension CKD 4 who presents to the ED with left upper extremity left lower extremity numbness that has been going on for the last 2 days.  MRI brain without contrast demonstrated small acute right frontoparietal strokes.  Etiology I suspect is likely new onset afibb. Will need to weigh in risk of falls before considering AC. Patient is a poor historian and will need to discuss with his daughter in AM to better understand hx of falls.  RECOMMENDATIONS  - Frequent Neuro checks per stroke  unit protocol - Recommend US Carotid doppler as MRA neck was non diagnostic - TTE with EF of 65-70%, mild left atrial dilation. - LDL of 35. - continue home atorvastatin - Recommend HbA1c to evaluate for diabetes and how well it is controlled. - Antithrombotic - aspirin 81mg  daily for now. Consider AC after discussion and weighing in risk of AC with patient's daughter. Unable to get in touch with her tonight over  phone. - Recommend DVT ppx for now. - SBP goal - aim for gradual normotension - Recommend Telemetry monitoring for arrythmia - Recommend bedside swallow screen prior to PO intake. - Stroke education booklet - Recommend PT/OT/SLP consult  ______________________________________________________________________    Signed, Erick Blinks, MD [facility]

## 2023-03-14 NOTE — ED Notes (Signed)
 [facility] called.

## 2023-03-14 NOTE — Progress Notes (Signed)
89 yo male with left-sided numbness.  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  He came back to the ER yesterday with complaints of left upper and lower extremity numbness going on for 48 hours.  MRI of the brain shows acute right frontotemporal parietal stroke. He is a new onset A-fib started on heparin drip UA done 4 days ago concerning for UTI and culture grew E. 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 TSH 1.3 Echocardiogram-EF 65 to 70%.  Left ventricle has no regional wall motion abnormalities mild LVH right ventricular systolic function is mildly decreased. Hemoglobin A1c 5.5 Seen by GI for abdominal pain nausea vomiting dysphagia which has been resolved.  GI recommended Protonix 40 mg daily and empiric sucralfate 1 g 4 times a day.  If symptoms recur follow-up with [facility] as outpatient. When I saw him today he had no complaints of dysphagia dysarthria weakness left-sided weakness facial droop etc.  He was standing up and trying to urinate.  Denied lightheadedness double vision headaches.  He is waiting for transfer to [facility] to be seen by neurology.  PT evaluation is pending.  Continue heparin and statin.

## 2023-03-14 NOTE — ED Notes (Signed)
Complaining of numbness in his left hand and arm, said he had a stroke years ago that affected that side but it got better. He said the numbness started yesterday and today he noticed he couldn't walk like normal.

## 2023-03-15 ENCOUNTER — Inpatient Hospital Stay (HOSPITAL_COMMUNITY): Payer: Medicare Other

## 2023-03-15 DIAGNOSIS — N184 Chronic kidney disease, stage 4 (severe): Secondary | ICD-10-CM | POA: Diagnosis not present

## 2023-03-15 DIAGNOSIS — K219 Gastro-esophageal reflux disease without esophagitis: Secondary | ICD-10-CM

## 2023-03-15 DIAGNOSIS — K6289 Other specified diseases of anus and rectum: Secondary | ICD-10-CM

## 2023-03-15 DIAGNOSIS — I2510 Atherosclerotic heart disease of native coronary artery without angina pectoris: Secondary | ICD-10-CM | POA: Diagnosis not present

## 2023-03-15 DIAGNOSIS — I4819 Other persistent atrial fibrillation: Secondary | ICD-10-CM

## 2023-03-15 DIAGNOSIS — N3000 Acute cystitis without hematuria: Secondary | ICD-10-CM

## 2023-03-15 DIAGNOSIS — E7800 Pure hypercholesterolemia, unspecified: Secondary | ICD-10-CM

## 2023-03-15 DIAGNOSIS — D649 Anemia, unspecified: Secondary | ICD-10-CM

## 2023-03-15 DIAGNOSIS — I639 Cerebral infarction, unspecified: Secondary | ICD-10-CM | POA: Diagnosis not present

## 2023-03-15 DIAGNOSIS — I6523 Occlusion and stenosis of bilateral carotid arteries: Secondary | ICD-10-CM | POA: Diagnosis not present

## 2023-03-15 LAB — CBC
HCT: 30.9 % — ABNORMAL LOW (ref 39.0–52.0)
Hemoglobin: 10.1 g/dL — ABNORMAL LOW (ref 13.0–17.0)
MCH: 30.1 pg (ref 26.0–34.0)
MCHC: 32.7 g/dL (ref 30.0–36.0)
MCV: 92.2 fL (ref 80.0–100.0)
Platelets: 307 10*3/uL (ref 150–400)
RBC: 3.35 MIL/uL — ABNORMAL LOW (ref 4.22–5.81)
RDW: 14 % (ref 11.5–15.5)
WBC: 8.9 10*3/uL (ref 4.0–10.5)
nRBC: 0 % (ref 0.0–0.2)

## 2023-03-15 LAB — RENAL FUNCTION PANEL
Albumin: 3.5 g/dL (ref 3.5–5.0)
Anion gap: 12 (ref 5–15)
BUN: 16 mg/dL (ref 8–23)
CO2: 21 mmol/L — ABNORMAL LOW (ref 22–32)
Calcium: 9.2 mg/dL (ref 8.9–10.3)
Chloride: 102 mmol/L (ref 98–111)
Creatinine, Ser: 2.01 mg/dL — ABNORMAL HIGH (ref 0.61–1.24)
GFR, Estimated: 31 mL/min — ABNORMAL LOW (ref >60)
Glucose, Bld: 114 mg/dL — ABNORMAL HIGH (ref 70–99)
Phosphorus: 3.6 mg/dL (ref 2.5–4.6)
Potassium: 4 mmol/L (ref 3.5–5.1)
Sodium: 135 mmol/L (ref 135–145)

## 2023-03-15 LAB — MAGNESIUM: Magnesium: 2 mg/dL (ref 1.7–2.4)

## 2023-03-15 LAB — HEPARIN LEVEL (UNFRACTIONATED): Heparin Unfractionated: 0.56 [IU]/mL (ref 0.30–0.70)

## 2023-03-15 LAB — GLUCOSE, CAPILLARY: Glucose-Capillary: 99 mg/dL (ref 70–99)

## 2023-03-15 MED ORDER — SODIUM CHLORIDE 0.9 % IV SOLN: INTRAVENOUS | Status: AC

## 2023-03-15 MED ORDER — APIXABAN 2.5 MG PO TABS
2.5000 mg | ORAL_TABLET | Freq: Two times a day (BID) | ORAL | Status: DC
Start: 2023-03-15 — End: 2023-03-19
  Administered 2023-03-15 – 2023-03-19 (×9): 2.5 mg via ORAL
  Filled 2023-03-15 (×11): qty 1

## 2023-03-15 NOTE — Consult Note (Signed)
Cardiology Consultation   Patient ID: ARIZ TERRONES MRN: [id number]; DOB: 1928-06-28  Admit date: 03/13/2023 Date of Consult: 03/15/2023  PCP:  Tally Joe, MD   [facility] Providers Cardiologist:  Kristeen Miss, MD   {  Patient Profile:   SHYKEEM RESURRECCION is a 89 y.o. male with a hx of CAD status post CABG x 5 2001, carotid artery disease, hypertension, CKD stage IV, CVA, mesenteric artery stenosis followed by [facility]VS, prostate cancer who is being seen 03/15/2023 for the evaluation of onset atrial fibrillation at the request of neurology.  History of Present Illness:   Mr. Valent has remote history of CABG x 5 in 2011.  He had a left heart catheterization in September 2019 that showed widely patent bypass grafts, severe native vessel calcifications.  50% stenosis of the segmental left main with total occlusion of the mid LAD after the first diagonal.  There was consideration of PTCA of the LAD once his renal function had improved or if he had persistent symptoms.  He was just seen by Dr. Elease Hashimoto earlier this month, reported to have been doing well without any significant complaints.  Currently patient being admitted for acute stroke with MRI showing acute right frontal temporal parietal stroke as well as new onset atrial fibrillation.  Initially presented with left upper and lower extremity numbness.  He was started on IV heparin to later discuss risks and benefits of DOAC with daughter.  Patient is accompanied with daughter who also lives with him.  She reports that he is very functional at home with no very few incidences of falls.  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  Denies any shortness of breath, chest pain, palpitations, dizziness, syncope, fatigue, peripheral edema, orthopnea.  Additionally daughter shares that her brother had just sustained a very severe stroke and was later comatose and has just been very approximately a week ago.   This has been very taxing on the family.  Chest x-ray showing right basilar opacity pneumonia versus atelectasis.  Creatinine 2.01.  Hemoglobin 10.1.  Troponins negative.  Otherwise no other significant lab or imaging findings.   Past Medical History:  Diagnosis Date   Arthritis    Carotid arterial disease (HCC)    Chronic kidney disease (CKD), stage IV (severe) (HCC)    Diverticulosis    colonic diverticulosis   Gallstones    GERD (gastroesophageal reflux disease)    Hypertension    Myocardial infarction [facility])    Prostate cancer (HCC)    Status post coronary artery bypass grafting     intraoperative cholangiogram   Stroke [facility])    patient reports no deficits    Urinary incontinence    bladder leakage - " patient wears a pad"    Past Surgical History:  Procedure Laterality Date   BACK SURGERY     x 4, has titanium in back per patient    CHOLECYSTECTOMY, LAPAROSCOPIC     CORONARY ARTERY BYPASS GRAFT  2001   CRYOTHERAPY     Prostate Cancer   LEFT HEART CATH AND CORS/GRAFTS ANGIOGRAPHY N/A 10/27/2017   Procedure: LEFT HEART CATH AND CORS/GRAFTS ANGIOGRAPHY;  Surgeon: Lyn Records, MD;  Location: [facility] INVASIVE CV LAB;  Service: Cardiovascular;  Laterality: N/A;   prostate cancer     ROTATOR CUFF REPAIR     LEFT   TOTAL KNEE ARTHROPLASTY Right 12/18/2014   Procedure: TOTAL RIGHT KNEE ARTHROPLASTY;  Surgeon: Durene Romans, MD;  Location: [facility] ORS;  Service: Orthopedics;  Laterality: Right;     Inpatient Medications: Scheduled Meds:  aspirin  300 mg Rectal Daily   Or   aspirin  325 mg Oral Daily   atorvastatin  80 mg Oral QPM   carvedilol  12.5 mg Oral BID WC   pantoprazole  40 mg Oral Daily   sucralfate  1 g Oral TID WC & HS   Continuous Infusions:  cefTRIAXone (ROCEPHIN)  IV 2 g (03/15/23 0019)   heparin 1,100 Units/hr (03/15/23 1038)   metronidazole 500 mg (03/15/23 1000)   PRN Meds: acetaminophen **OR** acetaminophen (TYLENOL) oral liquid 160 mg/5 mL **OR**  acetaminophen, hydrALAZINE  Allergies:    Allergies  Allergen Reactions   Amlodipine Besylate     Other reaction(s): 10 mg leads to swelling; tolerates 5 mg   Mirabegron     Other reaction(s): Unknown   Vibegron     Other reaction(s): mental status changes    Social History:   Social History   Socioeconomic History   Marital status: Widowed    Spouse name: Not on file   Number of children: Not on file   Years of education: Not on file   Highest education level: Not on file  Occupational History   Not on file  Tobacco Use   Smoking status: Never   Smokeless tobacco: Never  Vaping Use   Vaping status: Never Used  Substance and Sexual Activity   Alcohol use: No   Drug use: No   Sexual activity: Not Currently  Other Topics Concern   Not on file  Social History Narrative   Not on file   Social Drivers of Health   Financial Resource Strain: Not on file  Food Insecurity: No Food Insecurity (03/14/2023)   Hunger Vital Sign    Worried About Running Out of Food in the Last Year: Never true    Ran Out of Food in the Last Year: Never true  Transportation Needs: No Transportation Needs (03/14/2023)   PRAPARE - Administrator, Civil Service (Medical): No    Lack of Transportation (Non-Medical): No  Physical Activity: Not on file  Stress: Not on file  Social Connections: Moderately Isolated (03/14/2023)   Social Connection and Isolation Panel [NHANES]    Frequency of Communication with Friends and Family: Twice a week    Frequency of Social Gatherings with Friends and Family: Three times a week    Attends Religious Services: More than 4 times per year    Active Member of Clubs or Organizations: No    Attends Banker Meetings: Never    Marital Status: Widowed  Intimate Partner Violence: Not At Risk (03/14/2023)   Humiliation, Afraid, Rape, and Kick questionnaire    Fear of Current or Ex-Partner: No    Emotionally Abused: No    Physically Abused: No     Sexually Abused: No    Family History:    Family History  Problem Relation Age of Onset   Heart failure Mother      ROS:  Please see the history of present illness.   All other ROS reviewed and negative.     Physical Exam/Data:   Vitals:   03/15/23 0032 03/15/23 0452 03/15/23 0912 03/15/23 1139  BP: (!) 144/70 (!) 166/91 (!) 144/59 118/66  Pulse: 89 90  74  Resp: 18 18    Temp: (!) 97.4 F (36.3 C) (!) 97.5 F (36.4 C) (!) 97.5 F (36.4 C) 97.8  F (36.6 C)  TempSrc: Oral Oral Oral Axillary  SpO2: 98% 97% 100% 100%    Intake/Output Summary (Last 24 hours) at 03/15/2023 1253 Last data filed at 03/15/2023 0900 Gross per 24 hour  Intake --  Output 700 ml  Net -700 ml      03/09/2023    7:01 AM 02/16/2023    2:57 PM 08/05/2022   10:42 AM  Last 3 Weights  Weight (lbs) 203 lb 193 lb 6.4 oz 180 lb  Weight (kg) 92.08 kg 87.726 kg 81.647 kg     There is no height or weight on file to calculate BMI.   General appearance: alert, appears stated age, no distress, and pale Neck: no carotid bruit, no JVD, and thyroid not enlarged, symmetric, no tenderness/mass/nodules Lungs: clear to auscultation bilaterally Heart: irregularly irregular rhythm Abdomen: soft, non-tender; bowel sounds normal; no masses,  no organomegaly Extremities: extremities normal, atraumatic, no cyanosis or edema Pulses: 2+ and symmetric Skin: Skin color, texture, turgor normal. No rashes or lesions Neurologic: Grossly normal Psych: Pleasant   EKG:  The EKG was personally reviewed and demonstrates: Atrial fibrillation, heart rate 99.  No acute ST-T wave changes. Telemetry:  Telemetry was personally reviewed and demonstrates: Atrial fibrillation heart rates between 80-100.  Relevant CV Studies: Echocardiogram 03/14/2023 1. Left ventricular ejection fraction, by estimation, is 65 to 70%. Left  ventricular ejection fraction by PLAX is 66 %. The left ventricle has  normal function. The left ventricle has no  regional wall motion  abnormalities. There is mild left ventricular  hypertrophy. Left ventricular diastolic function could not be evaluated.   2. Right ventricular systolic function is mildly reduced. The right  ventricular size is normal.   3. Left atrial size was mildly dilated.   4. The mitral valve is abnormal. Trivial mitral valve regurgitation.   5. The aortic valve is tricuspid. There is mild calcification of the  aortic valve. Aortic valve regurgitation is trivial. Aortic valve  sclerosis/calcification is present, without any evidence of aortic  stenosis.   6. Aortic dilatation noted. There is borderline dilatation of the  ascending aorta, measuring 38 mm.   7. Rhythm strip during this exam demonstrates atrial fibrillation.   Comparison(s): Prior images unable to be directly viewed, comparison made  by report only. Changes from prior study are noted. 07/26/2019: LVEF  55-60%, normal RV systolic function.   Left heart catheterization 10/27/2017 Widely patent bypass grafts including sequential saphenous vein graft to PDA and PL, saphenous vein graft to OM, saphenous vein graft to LAD, and LIMA to OM1. Severe native vessel disease including heavy diffuse calcification in all territories. Segmental 50% left main. Ostial occlusion of the right coronary. Total occlusion of the mid LAD after the first diagonal.  The LAD beyond the saphenous vein graft insertion into the mid to distal LAD contains calcified relatively focal 95% stenosis. Total occlusion of the proximal circumflex. Normal LVEDP   RECOMMENDATIONS:   Continue medical therapy Four additional hours of IV hydration prior to discharge.  Kidney function on Friday. Plavix 75 mg/day. Consider PTCA of LAD via the saphenous vein graft once kidneys recover and if symptoms warrant.  Laboratory Data:  High Sensitivity Troponin:   Recent Labs  Lab 03/09/23 0735 03/14/23 0323 03/14/23 0506  TROPONINIHS 17 15 18*      Chemistry Recent Labs  Lab 03/09/23 0735 03/13/23 1207 03/14/23 0323 03/15/23 0944  NA 132* 133*  --  135  K 3.5 4.5  --  4.0  CL 96* 100  --  102  CO2 25 22  --  21*  GLUCOSE 134* 117*  --  114*  BUN 27* 20  --  16  CREATININE 2.17* 2.26* 2.08* 2.01*  CALCIUM 9.1 8.7*  --  9.2  MG  --   --   --  2.0  GFRNONAA 28* 27* 29* 31*  ANIONGAP 11 11  --  12    Recent Labs  Lab 03/09/23 0735 03/15/23 0944  PROT 6.9  --   ALBUMIN 3.8 3.5  AST 22  --   ALT 22  --   ALKPHOS 65  --   BILITOT 0.8  --    Lipids  Recent Labs  Lab 03/14/23 0323  CHOL 98  TRIG 161*  HDL 31*  LDLCALC 35  CHOLHDL 3.2    Hematology Recent Labs  Lab 03/13/23 1207 03/14/23 0323 03/15/23 0557  WBC 7.6 8.3 8.9  RBC 3.19* 3.24*  3.26* 3.35*  HGB 9.7* 9.9* 10.1*  HCT 30.2* 30.3* 30.9*  MCV 94.7 93.5 92.2  MCH 30.4 30.6 30.1  MCHC 32.1 32.7 32.7  RDW 14.0 13.7 14.0  PLT 294 296 307   Thyroid  Recent Labs  Lab 03/14/23 0323  TSH 1.328    BNPNo results for input(s): "BNP", "PROBNP" in the last 168 hours.  DDimer No results for input(s): "DDIMER" in the last 168 hours.   Radiology/Studies:  MR ANGIO NECK WO CONTRAST Result Date: 03/14/2023 CLINICAL DATA:  Acute neurologic deficit EXAM: MRA NECK WITHOUT CONTRAST TECHNIQUE: Angiographic images of the neck were acquired using MRA technique without intravenous contrast. Carotid stenosis measurements (when applicable) are obtained utilizing NASCET criteria, using the distal internal carotid diameter as the denominator. COMPARISON:  None Available. FINDINGS: Severely technically degraded MRA. There is no flow related enhancement in any vessel, which is likely artifactual. There is a 3 vessel aortic arch branching pattern. The study is essentially nondiagnostic. IMPRESSION: Severely technically degraded MRA, essentially nondiagnostic. Electronically Signed   By: Deatra Robinson M.D.   On: 03/14/2023 21:41   MR ANGIO HEAD WO CONTRAST Result Date:  03/14/2023 CLINICAL DATA:  Acute neurologic deficit EXAM: MRA HEAD WITHOUT CONTRAST TECHNIQUE: Angiographic images of the Circle of Willis were acquired using MRA technique without intravenous contrast. COMPARISON:  None Available. FINDINGS: POSTERIOR CIRCULATION: Vertebral arteries are normal. No proximal occlusion of the anterior or inferior cerebellar arteries. Basilar artery is diminutive distally. Superior cerebellar arteries are normal. Posterior cerebral arteries are normal. ANTERIOR CIRCULATION: Intracranial internal carotid arteries are normal. Anterior cerebral arteries are normal. Middle cerebral arteries are normal. Anatomic Variants: Fetal predominant origins of the posterior cerebral arteries. IMPRESSION: Normal intracranial MRA. Electronically Signed   By: Deatra Robinson M.D.   On: 03/14/2023 21:23   ECHOCARDIOGRAM COMPLETE Result Date: 03/14/2023    ECHOCARDIOGRAM REPORT   Patient Name:   DYKE WEIBLE Date of Exam: 03/14/2023 Medical Rec #:  [id number]     Height:       71.0 in Accession #:    [id number]    Weight:       203.0 lb Date of Birth:  18-Jul-1928    BSA:          2.122 m Patient Age:    89 years      BP:           130/99 mmHg Patient Gender: M             HR:  75 bpm. Exam Location:  Inpatient Procedure: 2D Echo, Color Doppler, Cardiac Doppler and Intracardiac            Opacification Agent Indications:    Stroke I63.9  History:        Patient has prior history of Echocardiogram examinations, most                 recent 07/26/2019.  Sonographer:    Harriette Bouillon RDCS Referring Phys: [id number] ARSHAD N KAKRAKANDY IMPRESSIONS  1. Left ventricular ejection fraction, by estimation, is 65 to 70%. Left ventricular ejection fraction by PLAX is 66 %. The left ventricle has normal function. The left ventricle has no regional wall motion abnormalities. There is mild left ventricular hypertrophy. Left ventricular diastolic function could not be evaluated.  2. Right ventricular systolic function is  mildly reduced. The right ventricular size is normal.  3. Left atrial size was mildly dilated.  4. The mitral valve is abnormal. Trivial mitral valve regurgitation.  5. The aortic valve is tricuspid. There is mild calcification of the aortic valve. Aortic valve regurgitation is trivial. Aortic valve sclerosis/calcification is present, without any evidence of aortic stenosis.  6. Aortic dilatation noted. There is borderline dilatation of the ascending aorta, measuring 38 mm.  7. Rhythm strip during this exam demonstrates atrial fibrillation. Comparison(s): Prior images unable to be directly viewed, comparison made by report only. Changes from prior study are noted. 07/26/2019: LVEF 55-60%, normal RV systolic function. FINDINGS  Left Ventricle: Left ventricular ejection fraction, by estimation, is 65 to 70%. Left ventricular ejection fraction by PLAX is 66 %. The left ventricle has normal function. The left ventricle has no regional wall motion abnormalities. The left ventricular internal cavity size was normal in size. There is mild left ventricular hypertrophy. Left ventricular diastolic function could not be evaluated due to atrial fibrillation. Left ventricular diastolic function could not be evaluated. Right Ventricle: The right ventricular size is normal. No increase in right ventricular wall thickness. Right ventricular systolic function is mildly reduced. Left Atrium: Left atrial size was mildly dilated. Right Atrium: Right atrial size was normal in size. Pericardium: There is no evidence of pericardial effusion. Mitral Valve: The mitral valve is abnormal. Mild mitral annular calcification. Trivial mitral valve regurgitation. Tricuspid Valve: The tricuspid valve is grossly normal. Tricuspid valve regurgitation is mild. Aortic Valve: The aortic valve is tricuspid. There is mild calcification of the aortic valve. Aortic valve regurgitation is trivial. Aortic valve sclerosis/calcification is present, without any  evidence of aortic stenosis. Pulmonic Valve: The pulmonic valve was normal in structure. Pulmonic valve regurgitation is not visualized. Aorta: Aortic dilatation noted. There is borderline dilatation of the ascending aorta, measuring 38 mm. IAS/Shunts: No atrial level shunt detected by color flow Doppler. EKG: Rhythm strip during this exam demonstrates atrial fibrillation.  LEFT VENTRICLE PLAX 2D LV EF:         Left ventricular ejection fraction by PLAX is 66 %. LVIDd:         5.20 cm LVIDs:         3.30 cm LV PW:         1.20 cm LV IVS:        1.10 cm LVOT diam:     2.40 cm LV SV:         81 LV SV Index:   38 LVOT Area:     4.52 cm  LEFT ATRIUM             Index  LA diam:        5.40 cm 2.54 cm/m LA Vol (A2C):   74.1 ml 34.92 ml/m LA Vol (A4C):   73.9 ml 34.83 ml/m LA Biplane Vol: 74.6 ml 35.16 ml/m  AORTIC VALVE LVOT Vmax:   83.60 cm/s LVOT Vmean:  54.700 cm/s LVOT VTI:    0.179 m  AORTA Ao Root diam: 3.30 cm Ao Asc diam:  3.80 cm TRICUSPID VALVE TR Peak grad:   23.6 mmHg TR Vmax:        243.00 cm/s  SHUNTS Systemic VTI:  0.18 m Systemic Diam: 2.40 cm Zoila Shutter MD Electronically signed by Zoila Shutter MD Signature Date/Time: 03/14/2023/1:17:21 PM    Final    MR BRAIN WO CONTRAST Result Date: 03/13/2023 CLINICAL DATA:  Neuro deficit, acute, stroke suspected. Left upper extremity numbness. Difficulty walking. EXAM: MRI HEAD WITHOUT CONTRAST TECHNIQUE: Multiplanar, multiecho pulse sequences of the brain and surrounding structures were obtained without intravenous contrast. COMPARISON:  Head CT 03/13/2023 FINDINGS: Brain: Scattered small acute cortical and subcortical infarcts are present in the posterior right frontal and anterior right parietal lobes (MCA territory). Several chronic microhemorrhages are noted in the cerebral and cerebellar hemispheres. Patchy to confluent T2 hyperintensities in the cerebral white matter bilaterally are nonspecific but compatible with moderate to severe chronic small vessel  ischemic disease. Small chronic infarcts are present in the right occipital lobe, both cerebellar hemispheres, right pons, thalami, and bilateral basal ganglia. There is moderate cerebral and cerebellar atrophy. No mass, midline shift, or extra-axial fluid collection is identified. Vascular: Absent flow void in the distal left vertebral artery. Skull and upper cervical spine: Unremarkable bone marrow signal. Sinuses/Orbits: Bilateral cataract extraction. Opacification of a single posterior left ethmoid air cell. No significant mastoid fluid. Other: None. IMPRESSION: 1. Small acute right frontoparietal infarcts. 2. Moderate to severe chronic small vessel ischemic disease with numerous chronic infarcts. 3. 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 Electronically Signed   By: Sebastian Ache M.D.   On: 03/13/2023 17:31   CT Head Wo Contrast Result Date: 03/13/2023 CLINICAL DATA:  Neuro deficit, acute, stroke suspected EXAM: CT HEAD WITHOUT CONTRAST TECHNIQUE: Contiguous axial images were obtained from the base of the skull through the vertex without intravenous contrast. RADIATION DOSE REDUCTION: This exam was performed according to the departmental dose-optimization program which includes automated exposure control, adjustment of the mA and/or kV according to patient size and/or use of iterative reconstruction technique. COMPARISON:  08/05/2022 FINDINGS: Brain: No evidence of acute infarction, hemorrhage, hydrocephalus, extra-axial collection or mass lesion/mass effect. Patchy low-density changes within the periventricular and subcortical white matter most compatible with chronic microvascular ischemic change. Moderate diffuse cerebral volume loss. Vascular: Atherosclerotic calcifications involving the large vessels of the skull base. No unexpected hyperdense vessel. Skull: Normal. Negative for fracture or focal lesion. Sinuses/Orbits: No acute finding. Other: None. IMPRESSION: 1. No acute intracranial  findings. 2. Chronic microvascular ischemic change and cerebral volume loss. Electronically Signed   By: Duanne Guess D.O.   On: 03/13/2023 11:57     Assessment and Plan:   New onset atrial fibrillation with controlled ventricular rates Asymptomatic.  Echo was EF 65 to 70% with mildly reduced RV function.  Rate controlled heart rates between 80-100. Transition from IV heparin to reduced dose of Eliquis 2.5 mg twice daily for age and renal function.  Very functional at home reports rare falls. Stop aspirin Once stable and after around 3 to 4 weeks can consider DCCV at the time  if still in A-fib. Continue carvedilol 12.5 mg twice daily TSH normal this admission  CAD status post CABG in 2001 Last cardiac catheterization was in September 2019 that showed widely patent bypass grafts however with significant LAD stenosis beyond the LIMA insertion.  Report noted that PTCA of the LAD could be considered if symptoms indicated.  No chest pain. Continue Eliquis, beta-blocker, atorvastatin 80 mg.  LDL 35.  Hypertension Allow for permissive hypertension per neurology.  On Coreg right now.  Acute right frontoparietal CVA Per neurology.  Now getting carotid Dopplers.  CKD Follows with nephrology.  Creatinine >2.0  History of prostate cancer UTI Proctitis  No other suggestions at this time.  Thanks for the consultation.  [facility] will sign off.   Medication Recommendations:  As above Other recommendations (labs, testing, etc):  none Follow up as an outpatient:  Dr. Elease Hashimoto or APP   Risk Assessment/Risk Scores:   CHA2DS2-VASc Score = 6  This indicates a 9.7% annual risk of stroke. The patient's score is based upon: CHF History: 0 HTN History: 1 Diabetes History: 0 Stroke History: 2 Vascular Disease History: 1 Age Score: 2 Gender Score: 0    For questions or updates, please contact [facility] Please consult [email or web address] for contact info under    Chrystie Nose, MD, Milagros Loll  [place]  [facility]  Medical Director of the [facility]id [facility]  Cardiovascular Risk Reduction Clinic Diplomate of the American Board of Clinical Lipidology Attending Cardiologist  Direct Dial: [phone]  Fax: [phone]  Website:  [email or web address]  Abagail Kitchens, PA-C  03/15/2023 12:53 PM

## 2023-03-15 NOTE — Evaluation (Signed)
Speech Language Pathology Evaluation Patient Details Name: RHYATT MUSKA MRN: [id number] DOB: [birth year] Today's Date: 03/15/2023 Time: 4098-1191 SLP Time Calculation (min) (ACUTE ONLY): 19 min  Problem List:  Patient Active Problem List   Diagnosis Date Noted   Acute CVA (cerebrovascular accident) (HCC) 03/14/2023   Chronic anemia 03/14/2023   UTI (urinary tract infection) 03/14/2023   Proctitis 03/14/2023   Atrial fibrillation (HCC) 03/14/2023   Acute stress disorder 09/22/2021   Adjustment disorder 09/22/2021   Allergic rhinitis 09/22/2021   Benign prostatic hyperplasia 09/22/2021   Esophageal dysphagia 09/22/2021   Gastroesophageal reflux disease 09/22/2021   Grief 09/22/2021   Insomnia 09/22/2021   Long term (current) use of anticoagulants 09/22/2021   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   Moderate major depression, single episode (HCC) 09/22/2021   Overactive bladder 09/22/2021   Prostate cancer (HCC) 09/22/2021   Pain in joint of right shoulder 10/24/2020   Pain in joint of left knee 06/06/2020   CKD (chronic kidney disease) stage 4, GFR 15-29 ml/min (HCC) 10/27/2017   Dyspnea on exertion 05/03/2015   Overweight (BMI 25.0-29.9) 12/19/2014   S/P right TKA 12/18/2014   Carotid disease, bilateral (HCC) 02/28/2013   Coronary artery disease involving coronary bypass graft of native heart with angina pectoris (HCC) 02/28/2013   Essential hypertension 02/28/2013   Hyperlipidemia 02/28/2013   Biceps tendonitis 10/02/2011   Past Medical History:  Past Medical History:  Diagnosis Date   Arthritis    Carotid arterial disease (HCC)    Chronic kidney disease (CKD), stage IV (severe) (HCC)    Diverticulosis    colonic diverticulosis   Gallstones    GERD (gastroesophageal reflux disease)    Hypertension    Myocardial infarction (HCC)    Prostate cancer (HCC)    Status post coronary artery bypass grafting     intraoperative cholangiogram   Stroke  [facility])    patient reports no deficits    Urinary incontinence    bladder leakage - " patient wears a pad"   Past Surgical History:  Past Surgical History:  Procedure Laterality Date   BACK SURGERY     x 4, has titanium in back per patient    CHOLECYSTECTOMY, LAPAROSCOPIC     CORONARY ARTERY BYPASS GRAFT  2001   CRYOTHERAPY     Prostate Cancer   LEFT HEART CATH AND CORS/GRAFTS ANGIOGRAPHY N/A 10/27/2017   Procedure: LEFT HEART CATH AND CORS/GRAFTS ANGIOGRAPHY;  Surgeon: Lyn Records, MD;  Location: [facility];  Service: Cardiovascular;  Laterality: N/A;   prostate cancer     ROTATOR CUFF REPAIR     LEFT   TOTAL KNEE ARTHROPLASTY Right 12/18/2014   Procedure: TOTAL RIGHT KNEE ARTHROPLASTY;  Surgeon: Durene Romans, MD;  Location: [facility];  Service: Orthopedics;  Laterality: Right;   HPI:  Pt is a 89 y/o M admitted on 03/13/23 after presenting with c/o LUE & LLE numbness x 48 hours. MRI showed acute R frontotemporoparietal stroke. Pt also with new onset of a-fib rate controlled. Of note, pt was seen in the ED 4 days prior with c/o abdominal pain; CT concerning for proctitis & pt was d/c home on Augmentin. PMH: CAD s/p CABG, HTN, CKD IV, stroke, mesenteric artery stenosis, prostate CA, MI, urinary incontinence. Pt I with driving, rides his four-wheeler and mows five acres of grass. Lives with dtr, Liborio Nixon.   Assessment / Plan / Recommendation Clinical Impression  Pt participated in the [facility] Mental Status Exam. Deficits  are primarily related to attention, visual/perceptual construction; performance degraded as session progressed, likely due to fatigue. He achieved a score of 13/30.  Initial questions were answered with good accuracy, but as session progressed and distractions in environment increased, accuracy was negatively impacted.  He demonstrated 5/5 on word recall with interference, but by end of screen was unable to answer any of the paragraph recall questions, scoring  0/8.  Numeric calculation and animal naming with time constraints was WNL. He lost points on the clock-drawing task, drawing all the numbers in right upper quadrant of clock with repetition of some numbers. He recognized errors independently but did not make effort to correct when prompted.  Speech and language were WNL; followed commands, able to give coherent information re: work history.  Pt is extremely active at baseline; dtr Liborio Nixon lives with him. He would benefit from SLP f/u given independence PTA. Recommend intensive therapy > three hours per day with SLP focusing on working memory and Advertising account executive (online awareness).    SLP Assessment  SLP Recommendation/Assessment: Patient needs continued Speech Lanaguage Pathology Services    Recommendations for follow up therapy are one component of a multi-disciplinary discharge planning process, led by the attending physician.  Recommendations may be updated based on patient status, additional functional criteria and insurance authorization.    Follow Up Recommendations       Assistance Recommended at Discharge  Intermittent Supervision/Assistance  Functional Status Assessment Patient has had a recent decline in their functional status and demonstrates the ability to make significant improvements in function in a reasonable and predictable amount of time.  Frequency and Duration min 2x/week  1 week      SLP Evaluation Cognition  Overall Cognitive Status: Impaired/Different from baseline Arousal/Alertness: Awake/alert Orientation Level: Oriented to person;Oriented to place;Disoriented to time Attention: Selective Selective Attention: Appears intact Memory: Impaired Memory Impairment: Decreased recall of new information Executive Function: Self Correcting Self Correcting: Impaired       Comprehension  Auditory Comprehension Overall Auditory Comprehension: Appears within functional limits for tasks assessed Visual  Recognition/Discrimination Discrimination: Within Function Limits Reading Comprehension Reading Status: Not tested    Expression Expression Primary Mode of Expression: Verbal Verbal Expression Overall Verbal Expression: Appears within functional limits for tasks assessed Written Expression Dominant Hand: Right Written Expression: Within Functional Limits   Oral / Motor  Motor Speech Overall Motor Speech: Appears within functional limits for tasks assessed            Blenda Mounts Laurice 03/15/2023, 12:35 PM Marchelle Folks L. Samson Frederic, MA CCC/SLP Clinical Specialist - Acute Care SLP Acute Rehabilitation Services Office number [phone]

## 2023-03-15 NOTE — Progress Notes (Signed)
STROKE TEAM PROGRESS NOTE   SUBJECTIVE (INTERVAL HISTORY) His daughter is at the bedside.  Overall his condition is rapidly improving.  Patient is lethargic due to lack of sleep last night.  However open eyes on voice, cooperative with examination, denies any weakness or numbness on the left.  Still has A-fib but no more RVR.  Has transitioned from heparin IV to Eliquis.   OBJECTIVE Temp:  [97.3 F (36.3 C)-97.8 F (36.6 C)] 97.3 F (36.3 C) (02/03 1521) Pulse Rate:  [74-91] 84 (02/03 1521) Cardiac Rhythm: Atrial fibrillation (02/03 0846) Resp:  [18] 18 (02/03 0452) BP: (118-166)/(59-91) 165/77 (02/03 1521) SpO2:  [97 %-100 %] 98 % (02/03 1521)  Recent Labs  Lab 03/13/23 1115 03/15/23 0911  GLUCAP 102* 99   Recent Labs  Lab 03/09/23 0735 03/13/23 1207 03/14/23 0323 03/15/23 0944  NA 132* 133*  --  135  K 3.5 4.5  --  4.0  CL 96* 100  --  102  CO2 25 22  --  21*  GLUCOSE 134* 117*  --  114*  BUN 27* 20  --  16  CREATININE 2.17* 2.26* 2.08* 2.01*  CALCIUM 9.1 8.7*  --  9.2  MG  --   --   --  2.0  PHOS  --   --   --  3.6   Recent Labs  Lab 03/09/23 0735 03/15/23 0944  AST 22  --   ALT 22  --   ALKPHOS 65  --   BILITOT 0.8  --   PROT 6.9  --   ALBUMIN 3.8 3.5   Recent Labs  Lab 03/09/23 0735 03/13/23 1207 03/14/23 0323 03/15/23 0557  WBC 12.5* 7.6 8.3 8.9  NEUTROABS 9.2* 4.3  --   --   HGB 9.3* 9.7* 9.9* 10.1*  HCT 28.9* 30.2* 30.3* 30.9*  MCV 93.2 94.7 93.5 92.2  PLT 268 294 296 307   No results for input(s): "CKTOTAL", "CKMB", "CKMBINDEX", "TROPONINI" in the last 168 hours. No results for input(s): "LABPROT", "INR" in the last 72 hours. Recent Labs    03/13/23 1207  COLORURINE YELLOW  LABSPEC 1.020  PHURINE 5.0  GLUCOSEU NEGATIVE  HGBUR NEGATIVE  BILIRUBINUR NEGATIVE  KETONESUR NEGATIVE  PROTEINUR 30*  NITRITE NEGATIVE  LEUKOCYTESUR NEGATIVE       Component Value Date/Time   CHOL 98 03/14/2023 0323   TRIG 161 (H) 03/14/2023 0323   HDL  31 (L) 03/14/2023 0323   CHOLHDL 3.2 03/14/2023 0323   VLDL 32 03/14/2023 0323   LDLCALC 35 03/14/2023 0323   Lab Results  Component Value Date   HGBA1C 5.5 03/14/2023   No results found for: "LABOPIA", "COCAINSCRNUR", "LABBENZ", "AMPHETMU", "THCU", "LABBARB"  No results for input(s): "ETH" in the last 168 hours.  I have personally reviewed the radiological images below and agree with the radiology interpretations.  VAS US CAROTID Result Date: 03/15/2023 Carotid Arterial Duplex Study Patient Name:  Brandon Harris [facility]  Date of Exam:   03/15/2023 Medical Rec #: [id number]      Accession #:    [id number] Date of Birth: 11/02/1928     Patient Gender: M Patient Age:   89 years Exam Location:  [facility] Procedure:      VAS US CAROTID Referring Phys: Terrilee Files [facility] --------------------------------------------------------------------------------  Indications:       CVA, Carotid artery disease and Numbness. Risk Factors:      Hypertension, hyperlipidemia, no history of smoking, prior  MI, coronary artery disease, prior CVA. Other Factors:     CABG X 5, new atrial fibrillation. Limitations        Today's exam was limited due to vessel tortuosity, heavy                    calcification resulting in significant shadowing, and                    respiratory variation. Comparison Study:  Prior carotid duplex done 03/19/22 at [facility] indicating                    1-39% ICA stenosis, bilaterally. Patient has a long history                    of technically difficult carotid studies limited by calcific                    plaque, acoustic shadowing, and vessel tortuoisity with                    varying results from study to study. Performing Technologist: Sherren Kerns RVS  Examination Guidelines: A complete evaluation includes B-mode imaging, spectral Doppler, color Doppler, and power Doppler as needed of all accessible portions of each vessel. Bilateral testing is considered an integral part  of a complete examination. Limited examinations for reoccurring indications may be performed as noted.  Right Carotid Findings: +----------+--------+--------+--------+----------------------+---------+           PSV cm/sEDV cm/sStenosisPlaque Description    Comments  +----------+--------+--------+--------+----------------------+---------+ CCA Prox  40      13              irregular and calcific          +----------+--------+--------+--------+----------------------+---------+ CCA Distal66      22              irregular and calcific          +----------+--------+--------+--------+----------------------+---------+ ICA Prox  182     37      40-59%  calcific              Shadowing +----------+--------+--------+--------+----------------------+---------+ ICA Mid   54      15                                    tortuous  +----------+--------+--------+--------+----------------------+---------+ ICA Distal47      23                                    tortuous  +----------+--------+--------+--------+----------------------+---------+ ECA       66      9                                               +----------+--------+--------+--------+----------------------+---------+ +----------+--------+-------+--------+-------------------+           PSV cm/sEDV cmsDescribeArm Pressure (mmHG) +----------+--------+-------+--------+-------------------+ [id number]                                         +----------+--------+-------+--------+-------------------+ +---------+--------+--+--------+--+---------+ VertebralPSV cm/s55EDV cm/s15Antegrade +---------+--------+--+--------+--+---------+  Left Carotid  Findings: +----------+--------+--------+--------+------------------+--------+           PSV cm/sEDV cm/sStenosisPlaque DescriptionComments +----------+--------+--------+--------+------------------+--------+ CCA Prox  60      9                                           +----------+--------+--------+--------+------------------+--------+ CCA Distal61      3                                          +----------+--------+--------+--------+------------------+--------+ ICA Prox  163     40      40-59%                    tortuous +----------+--------+--------+--------+------------------+--------+ ICA Mid   105     23                                tortuous +----------+--------+--------+--------+------------------+--------+ ICA Distal                                          tortuous +----------+--------+--------+--------+------------------+--------+ ECA       130     13                                         +----------+--------+--------+--------+------------------+--------+ +----------+--------+--------+--------+-------------------+           PSV cm/sEDV cm/sDescribeArm Pressure (mmHG) +----------+--------+--------+--------+-------------------+ [id number]                                          +----------+--------+--------+--------+-------------------+ +---------+--------+--------+--------+ VertebralPSV cm/sEDV cm/soccluded +---------+--------+--------+--------+   Summary: Right Carotid: Velocities in the right ICA are consistent with a 40-59%                stenosis. Stenosis may be underestimated secondary to bulky                plaque and vessel tortuosity. Left Carotid: Velocities in the left ICA are consistent with a 40-59% stenosis.               Stenosis may be underestimated secondary to bulky plaque and               vessel tortuosity. Vertebrals:  Right vertebral artery demonstrates antegrade flow. Left vertebral              artery demonstrates an occlusion. Subclavians: Normal flow hemodynamics were seen in bilateral subclavian              arteries. *See table(s) above for measurements and observations.  Electronically signed by Sherald Hess MD on 03/15/2023 at 3:19:04 PM.    Final    MR ANGIO NECK WO  CONTRAST Result Date: 03/14/2023 CLINICAL DATA:  Acute neurologic deficit EXAM: MRA NECK WITHOUT CONTRAST TECHNIQUE: Angiographic images of the neck were acquired using MRA technique without intravenous contrast. Carotid stenosis measurements (when applicable) are obtained utilizing NASCET criteria, using the distal internal carotid diameter as the  denominator. COMPARISON:  None Available. FINDINGS: Severely technically degraded MRA. There is no flow related enhancement in any vessel, which is likely artifactual. There is a 3 vessel aortic arch branching pattern. The study is essentially nondiagnostic. IMPRESSION: Severely technically degraded MRA, essentially nondiagnostic. Electronically Signed   By: Deatra Robinson M.D.   On: 03/14/2023 21:41   MR ANGIO HEAD WO CONTRAST Result Date: 03/14/2023 CLINICAL DATA:  Acute neurologic deficit EXAM: MRA HEAD WITHOUT CONTRAST TECHNIQUE: Angiographic images of the Circle of Willis were acquired using MRA technique without intravenous contrast. COMPARISON:  None Available. FINDINGS: POSTERIOR CIRCULATION: Vertebral arteries are normal. No proximal occlusion of the anterior or inferior cerebellar arteries. Basilar artery is diminutive distally. Superior cerebellar arteries are normal. Posterior cerebral arteries are normal. ANTERIOR CIRCULATION: Intracranial internal carotid arteries are normal. Anterior cerebral arteries are normal. Middle cerebral arteries are normal. Anatomic Variants: Fetal predominant origins of the posterior cerebral arteries. IMPRESSION: Normal intracranial MRA. Electronically Signed   By: Deatra Robinson M.D.   On: 03/14/2023 21:23   ECHOCARDIOGRAM COMPLETE Result Date: 03/14/2023    ECHOCARDIOGRAM REPORT   Patient Name:   Brandon Harris Date of Exam: 03/14/2023 Medical Rec #:  [id number]     Height:       71.0 in Accession #:    [id number]    Weight:       203.0 lb Date of Birth:  21-Sep-1928    BSA:          2.122 m Patient Age:    89 years      BP:            130/99 mmHg Patient Gender: M             HR:           75 bpm. Exam Location:  Inpatient Procedure: 2D Echo, Color Doppler, Cardiac Doppler and Intracardiac            Opacification Agent Indications:    Stroke I63.9  History:        Patient has prior history of Echocardiogram examinations, most                 recent 07/26/2019.  Sonographer:    Harriette Bouillon RDCS Referring Phys: [id number] ARSHAD N KAKRAKANDY IMPRESSIONS  1. Left ventricular ejection fraction, by estimation, is 65 to 70%. Left ventricular ejection fraction by PLAX is 66 %. The left ventricle has normal function. The left ventricle has no regional wall motion abnormalities. There is mild left ventricular hypertrophy. Left ventricular diastolic function could not be evaluated.  2. Right ventricular systolic function is mildly reduced. The right ventricular size is normal.  3. Left atrial size was mildly dilated.  4. The mitral valve is abnormal. Trivial mitral valve regurgitation.  5. The aortic valve is tricuspid. There is mild calcification of the aortic valve. Aortic valve regurgitation is trivial. Aortic valve sclerosis/calcification is present, without any evidence of aortic stenosis.  6. Aortic dilatation noted. There is borderline dilatation of the ascending aorta, measuring 38 mm.  7. Rhythm strip during this exam demonstrates atrial fibrillation. Comparison(s): Prior images unable to be directly viewed, comparison made by report only. Changes from prior study are noted. 07/26/2019: LVEF 55-60%, normal RV systolic function. FINDINGS  Left Ventricle: Left ventricular ejection fraction, by estimation, is 65 to 70%. Left ventricular ejection fraction by PLAX is 66 %. The left ventricle has normal function. The left ventricle has no regional wall motion abnormalities. The  left ventricular internal cavity size was normal in size. There is mild left ventricular hypertrophy. Left ventricular diastolic function could not be evaluated due to atrial  fibrillation. Left ventricular diastolic function could not be evaluated. Right Ventricle: The right ventricular size is normal. No increase in right ventricular wall thickness. Right ventricular systolic function is mildly reduced. Left Atrium: Left atrial size was mildly dilated. Right Atrium: Right atrial size was normal in size. Pericardium: There is no evidence of pericardial effusion. Mitral Valve: The mitral valve is abnormal. Mild mitral annular calcification. Trivial mitral valve regurgitation. Tricuspid Valve: The tricuspid valve is grossly normal. Tricuspid valve regurgitation is mild. Aortic Valve: The aortic valve is tricuspid. There is mild calcification of the aortic valve. Aortic valve regurgitation is trivial. Aortic valve sclerosis/calcification is present, without any evidence of aortic stenosis. Pulmonic Valve: The pulmonic valve was normal in structure. Pulmonic valve regurgitation is not visualized. Aorta: Aortic dilatation noted. There is borderline dilatation of the ascending aorta, measuring 38 mm. IAS/Shunts: No atrial level shunt detected by color flow Doppler. EKG: Rhythm strip during this exam demonstrates atrial fibrillation.  LEFT VENTRICLE PLAX 2D LV EF:         Left ventricular ejection fraction by PLAX is 66 %. LVIDd:         5.20 cm LVIDs:         3.30 cm LV PW:         1.20 cm LV IVS:        1.10 cm LVOT diam:     2.40 cm LV SV:         81 LV SV Index:   38 LVOT Area:     4.52 cm  LEFT ATRIUM             Index LA diam:        5.40 cm 2.54 cm/m LA Vol (A2C):   74.1 ml 34.92 ml/m LA Vol (A4C):   73.9 ml 34.83 ml/m LA Biplane Vol: 74.6 ml 35.16 ml/m  AORTIC VALVE LVOT Vmax:   83.60 cm/s LVOT Vmean:  54.700 cm/s LVOT VTI:    0.179 m  AORTA Ao Root diam: 3.30 cm Ao Asc diam:  3.80 cm TRICUSPID VALVE TR Peak grad:   23.6 mmHg TR Vmax:        243.00 cm/s  SHUNTS Systemic VTI:  0.18 m Systemic Diam: 2.40 cm Zoila Shutter MD Electronically signed by Zoila Shutter MD Signature  Date/Time: 03/14/2023/1:17:21 PM    Final    MR BRAIN WO CONTRAST Result Date: 03/13/2023 CLINICAL DATA:  Neuro deficit, acute, stroke suspected. Left upper extremity numbness. Difficulty walking. EXAM: MRI HEAD WITHOUT CONTRAST TECHNIQUE: Multiplanar, multiecho pulse sequences of the brain and surrounding structures were obtained without intravenous contrast. COMPARISON:  Head CT 03/13/2023 FINDINGS: Brain: Scattered small acute cortical and subcortical infarcts are present in the posterior right frontal and anterior right parietal lobes (MCA territory). Several chronic microhemorrhages are noted in the cerebral and cerebellar hemispheres. Patchy to confluent T2 hyperintensities in the cerebral white matter bilaterally are nonspecific but compatible with moderate to severe chronic small vessel ischemic disease. Small chronic infarcts are present in the right occipital lobe, both cerebellar hemispheres, right pons, thalami, and bilateral basal ganglia. There is moderate cerebral and cerebellar atrophy. No mass, midline shift, or extra-axial fluid collection is identified. Vascular: Absent flow void in the distal left vertebral artery. Skull and upper cervical spine: Unremarkable bone marrow signal. Sinuses/Orbits: Bilateral cataract extraction. Opacification of  a single posterior left ethmoid air cell. No significant mastoid fluid. Other: None. IMPRESSION: 1. Small acute right frontoparietal infarcts. 2. Moderate to severe chronic small vessel ischemic disease with numerous chronic infarcts. 3. 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 Electronically Signed   By: Sebastian Ache M.D.   On: 03/13/2023 17:31   CT Head Wo Contrast Result Date: 03/13/2023 CLINICAL DATA:  Neuro deficit, acute, stroke suspected EXAM: CT HEAD WITHOUT CONTRAST TECHNIQUE: Contiguous axial images were obtained from the base of the skull through the vertex without intravenous contrast. RADIATION DOSE REDUCTION: This exam was  performed according to the departmental dose-optimization program which includes automated exposure control, adjustment of the mA and/or kV according to patient size and/or use of iterative reconstruction technique. COMPARISON:  08/05/2022 FINDINGS: Brain: No evidence of acute infarction, hemorrhage, hydrocephalus, extra-axial collection or mass lesion/mass effect. Patchy low-density changes within the periventricular and subcortical white matter most compatible with chronic microvascular ischemic change. Moderate diffuse cerebral volume loss. Vascular: Atherosclerotic calcifications involving the large vessels of the skull base. No unexpected hyperdense vessel. Skull: Normal. Negative for fracture or focal lesion. Sinuses/Orbits: No acute finding. Other: None. IMPRESSION: 1. No acute intracranial findings. 2. Chronic microvascular ischemic change and cerebral volume loss. Electronically Signed   By: Duanne Guess D.O.   On: 03/13/2023 11:57   CT CHEST ABDOMEN PELVIS WO CONTRAST Result Date: 03/09/2023 CLINICAL DATA:  Two day history of abdominal pain with emesis associated with dysphagia and questionable chest pain. History of prostate cancer. * Tracking Code: BO * EXAM: CT CHEST, ABDOMEN AND PELVIS WITHOUT CONTRAST TECHNIQUE: Multidetector CT imaging of the chest, abdomen and pelvis was performed following the standard protocol without IV contrast. RADIATION DOSE REDUCTION: This exam was performed according to the departmental dose-optimization program which includes automated exposure control, adjustment of the mA and/or kV according to patient size and/or use of iterative reconstruction technique. COMPARISON:  Same day chest radiograph, CT chest dated 08/05/2022, nuclear medicine PET dated 06/23/2022, CT abdomen and pelvis dated 05/22/2021 FINDINGS: CT CHEST FINDINGS Cardiovascular: Multichamber cardiomegaly. Curvilinear hypoattenuation at the left ventricular apex. No significant pericardial  fluid/thickening. Great vessels are normal in course and caliber. Coronary artery calcifications. Mediastinum/Nodes: Imaged thyroid gland without nodules meeting criteria for imaging follow-up by size. Mildly patulous, fluid-filled upper esophagus. No pathologically enlarged axillary, supraclavicular, mediastinal, or hilar lymph nodes. Lungs/Pleura: The central airways are patent. Increased conspicuity of lower lobe predominant subpleural reticulations. No substantial traction bronchiectasis or architectural distortion. No focal consolidation. No pneumothorax. Trace bilateral pleural effusions. Musculoskeletal: No definite focal osseous abnormality associated with the area of radiotracer uptake in the right anterior fourth rib. Multilevel degenerative changes of the thoracic spine. Median sternotomy wires are nondisplaced. CT ABDOMEN PELVIS FINDINGS Hepatobiliary: No focal hepatic lesions. No intra or extrahepatic biliary ductal dilation. Cholecystectomy. Pancreas: No focal lesions or main ductal dilation. Spleen: Normal in size without focal abnormality. Adrenals/Urinary Tract: No adrenal nodules. Atrophic bilateral kidneys. No suspicious renal mass on this noncontrast enhanced examination , calculi, or hydronephrosis. No focal bladder wall thickening. Stomach/Bowel: Normal appearance of the stomach. Cecum is again located in the right upper quadrant. No abnormal bowel dilation. Circumferential mural thickening of the anorectal junction. Colonic diverticulosis without acute diverticulitis. Appendix is not discretely seen. Vascular/Lymphatic: Aortic atherosclerosis. No enlarged abdominal or pelvic lymph nodes. Reproductive: Normal prostate gland size. Other: No free fluid, fluid collection, or free air. Musculoskeletal: No acute or abnormal lytic or blastic osseous findings. Multilevel degenerative changes of  the lumbar spine. Postsurgical changes of L1-L5 spinal fixation with increased L3 superior endplate  compression deformity compared to 05/22/2021. Small fat containing midline ventral epigastric hernia. IMPRESSION: 1. Circumferential mural thickening of the anorectal junction, which may be related to underdistention or proctitis. 2. Postsurgical changes of L1-L5 spinal fixation with increased L3 superior endplate compression deformity compared to 05/22/2021. 3. No definite focal osseous abnormality associated with the area of previous radiotracer uptake in the right anterior fourth rib. 4. Curvilinear hypoattenuation at the left ventricular apex, which may represent sequela of prior insult. 5. Trace bilateral pleural effusions. 6.  Aortic Atherosclerosis (ICD10-I70.0). Electronically Signed   By: Agustin Cree M.D.   On: 03/09/2023 10:36   DG Chest 1 View Result Date: 03/09/2023 CLINICAL DATA:  Cough. EXAM: CHEST  1 VIEW COMPARISON:  March 12, 2020. FINDINGS: Stable cardiomediastinal silhouette. Status post coronary bypass graft. Probable some degree of eventration involving the right hemidiaphragm. Mild right basilar atelectasis or infiltrate may be present as well. Bony thorax unremarkable. IMPRESSION: Possible mild right basilar opacity is noted concerning for pneumonia or possibly atelectasis. Followup PA and lateral chest X-ray is recommended in 3-4 weeks following trial of antibiotic therapy to ensure resolution and exclude underlying malignancy. Electronically Signed   By: Lupita Raider M.D.   On: 03/09/2023 08:09     PHYSICAL EXAM  Temp:  [97.3 F (36.3 C)-97.8 F (36.6 C)] 97.3 F (36.3 C) (02/03 1521) Pulse Rate:  [74-91] 84 (02/03 1521) Resp:  [18] 18 (02/03 0452) BP: (118-166)/(59-91) 165/77 (02/03 1521) SpO2:  [97 %-100 %] 98 % (02/03 1521)  General - Well nourished, well developed, in no apparent distress, lethargic.  Ophthalmologic - fundi not visualized due to noncooperation.  Cardiovascular - irregularly irregular heart rate and rhythm.  Neuro - lethargic and sleepy but eyes  open on voice, cooperative with exam, orientated to age, place, time and people. No aphasia, paucity of speech and soft voice with mild dysarthria, following all simple commands. Able to name 3/4 and repeat in dysarthric voice. No gaze palsy, tracking bilaterally, visual field full.  Slight left facial droop. Tongue midline. Bilateral UEs old rotator cuff injury with limited ROM bilateral shoulder, however bicep and tricep 4/5, finger grip 5/5. Bilaterally LEs 3/5, no drift. Sensation symmetrical bilaterally, b/l FTN intact grossly, gait not tested.     ASSESSMENT/PLAN Brandon Harris is a 89 y.o. male with history of CAD/MI status post CABG, CKD 4, stroke 30 years ago without residual admitted for left-sided numbness and difficulty walking.  Found to have new A-fib RVR, put on heparin IV. No TNK given due to outside window.    Stroke:  right MCA cortical several small infarcts, embolic likely secondary to new diagnosed A-fib RVR MRI right MCA cortical several small infarcts MRA head unremarkable Carotid Doppler bilateral ICA 40 to 59% stenosis 2D Echo EF 65 to 70% LDL 35 HgbA1c 5.5 Eliquis for VTE prophylaxis No antithrombotic prior to admission, was put on heparin IV, now transition to Eliquis (apixaban) twice daily.  Patient counseled to be compliant with his antithrombotic medications Ongoing aggressive stroke risk factor management Therapy recommendations: CIR Disposition: Pending  A-fib RVR, new diagnosis Currently rate controlled Was on heparin IV On Coreg Now transition to Eliquis twice daily Continue follow-up with Dr. Melburn Popper in cardiology  Hypertension Stable but fluctuate On Coreg Avoid low BP Long term BP goal normotensive  Hyperlipidemia Home meds: Lipitor 80 LDL 35, goal < 70 Now on Lipitor  80 Continue statin at discharge  Other Stroke Risk Factors Advanced age Hx stroke about 30 years ago, had extensive rehab and no residual.  Reported due to right sided  carotid artery atherosclerosis  Other Active Problems CKD 4, creatinine 2.26--2.08 Mild hyponatremia, sodium 133 Recent abdominal infection, on Rocephin and Flagyl  Hospital day # 1  Neurology will sign off. Please call with questions. Pt will follow up with stroke clinic NP at [facility] in about 4 weeks. Thanks for the consult.   Marvel Plan, MD PhD Stroke Neurology 03/15/2023 3:59 PM    To contact Stroke Continuity provider, please refer to [email or web address]. After hours, contact General Neurology

## 2023-03-15 NOTE — Progress Notes (Signed)
Inpatient Rehab Coordinator Note:  I met with patient and his daughter at bedside to discuss CIR recommendations and goals/expectations of CIR stay.  We reviewed 3 hrs/day of therapy, physician follow up, and average length of stay 2 weeks (dependent upon progress) with goals of supervision.  Pt is lethargic but able to participate in conversation well, keeps eyes closed.  Pt/daughter wanting to pursue inpatient rehab.  Will ask rehab MD for consult tomorrow morning and send to insurance at that time.    Estill Dooms, PT, DPT Admissions Coordinator [phone] 03/15/23  4:15 PM

## 2023-03-15 NOTE — TOC Initial Note (Signed)
Transition of Care [facility]) - Initial/Assessment Note    Patient Details  Name: Brandon Harris MRN: [id number] Date of Birth: April 05, 1928  [facility]) CM/SW Contact:    Kermit Balo, RN Phone Number: 03/15/2023, 1:42 PM  Clinical Narrative:                  CM met with the patient and his daughter at the bedside. Pt was quiet during the visit.  Daughter lives with him and someone is with him all the time.  Pt still IADL prior to admission.  Pt drives short distances with daughter providing transportation for longer trips.  Daughter over sees his medications at home. Current recommendations are for CIR.  TOC following.   Expected Discharge Plan: IP Rehab Facility Barriers to Discharge: Continued Medical Work up   Patient Goals and CMS Choice   CMS Medicare.gov Compare Post Acute Care list provided to:: Patient Choice offered to / list presented to : Patient, Adult Children      Expected Discharge Plan and Services     Post Acute Care Choice: IP Rehab Living arrangements for the past 2 months: Single Family Home                                      Prior Living Arrangements/Services Living arrangements for the past 2 months: Single Family Home Lives with:: Adult Children Patient language and need for interpreter reviewed:: Yes          Care giver support system in place?: Yes (comment) Current home services: DME (shower seat/ [employer]/ Copywriter, advertising) Criminal Activity/Legal Involvement Pertinent to Current Situation/Hospitalization: No - Comment as needed  Activities of Daily Living   ADL Screening (condition at time of admission) Independently performs ADLs?: Yes (appropriate for developmental age) Is the patient deaf or have difficulty hearing?: No Does the patient have difficulty seeing, even when wearing glasses/contacts?: No Does the patient have difficulty concentrating, remembering, or making decisions?: No  Permission  Sought/Granted                  Emotional Assessment Appearance:: Appears stated age         Psych Involvement: No (comment)  Admission diagnosis:  Paroxysmal atrial fibrillation (HCC) [I48.0] Acute ischemic stroke (HCC) [I63.9] Acute CVA (cerebrovascular accident) [facility]) [I63.9] Patient Active Problem List   Diagnosis Date Noted   Acute CVA (cerebrovascular accident) (HCC) 03/14/2023   Chronic anemia 03/14/2023   UTI (urinary tract infection) 03/14/2023   Proctitis 03/14/2023   Atrial fibrillation (HCC) 03/14/2023   Acute stress disorder 09/22/2021   Adjustment disorder 09/22/2021   Allergic rhinitis 09/22/2021   Benign prostatic hyperplasia 09/22/2021   Esophageal dysphagia 09/22/2021   Gastroesophageal reflux disease 09/22/2021   Grief 09/22/2021   Insomnia 09/22/2021   Long term (current) use of anticoagulants 09/22/2021   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   Moderate major depression, single episode (HCC) 09/22/2021   Overactive bladder 09/22/2021   Prostate cancer (HCC) 09/22/2021   Pain in joint of right shoulder 10/24/2020   Pain in joint of left knee 06/06/2020   CKD (chronic kidney disease) stage 4, GFR 15-29 ml/min (HCC) 10/27/2017   Dyspnea on exertion 05/03/2015   Overweight (BMI 25.0-29.9) 12/19/2014   S/P right TKA 12/18/2014   Carotid disease, bilateral (HCC) 02/28/2013   Coronary artery disease involving coronary bypass graft of native heart with  angina pectoris (HCC) 02/28/2013   Essential hypertension 02/28/2013   Hyperlipidemia 02/28/2013   Biceps tendonitis 10/02/2011   PCP:  Tally Joe, MD Pharmacy:   [facility] - [address] [place] - [address] [place] [address] Phone: [phone] Fax: [phone]  [id number] [place], [place] - [place] [address] [facility] [place] [address] Phone: [phone] Fax: [phone]     Social Drivers of Health  (SDOH) Social History: SDOH Screenings   Food Insecurity: No Food Insecurity (03/14/2023)  Housing: Low Risk  (03/14/2023)  Transportation Needs: No Transportation Needs (03/14/2023)  Utilities: Not At Risk (03/14/2023)  Social Connections: Moderately Isolated (03/14/2023)  Tobacco Use: Low Risk  (03/14/2023)   SDOH Interventions:     Readmission Risk Interventions     No data to display

## 2023-03-15 NOTE — Progress Notes (Signed)
VASCULAR LAB    Carotid duplex has been performed.  See CV proc for preliminary results.   Josejulian Tarango, RVT 03/15/2023, 1:29 PM

## 2023-03-15 NOTE — Progress Notes (Signed)
PROGRESS NOTE    Brandon Harris  XBJ:[id number] DOB: 27-Mar-1928 DOA: 03/13/2023 PCP: Tally Joe, MD    Chief Complaint  Patient presents with   Numbness    Brief Narrative:  Patient is a pleasant 89 year old gentleman history of CAD status post CABG, CKD stage IV, history of prior CVA, hypertension, mesenteric artery stenosis, history of prostate cancer initially presented to the ED 4 days prior to admission with abdominal pain, CT chest abdomen pelvis had findings concerning for chronic proctitis patient discharged on Augmentin.  Patient presented back to the ED with complaints of left upper and lower extremity numbness ongoing for 48 hours, MRI brain done concerning for acute right frontal temporal parietal stroke.  Patient also noted to be in new onset A-fib started on heparin drip.  Patient admitted for stroke workup.  Patient seen in consultation by GI, neurology, cardiology.   Assessment & Plan:   Principal Problem:   Acute CVA (cerebrovascular accident) [facility]) Active Problems:   Essential hypertension   Hyperlipidemia   CKD (chronic kidney disease) stage 4, GFR 15-29 ml/min (HCC)   Gastroesophageal reflux disease   Chronic anemia   UTI (urinary tract infection)   Proctitis   Atrial fibrillation (HCC)  #1 acute right frontoparietal CVA -Likely cardioembolic in nature secondary to new onset atrial fibrillation. -Patient with clinical improvement of left upper and left lower extremity numbness. -Head CT done with no acute intracranial findings, chronic microvascular ischemic changes and cerebral volume loss. -MRI brain done with small acute right frontoparietal infarcts.  Moderate to severe chronic small vessel ischemic disease and numerous chronic infarcts.  Suspected occlusion of distal left vertebral artery. -MRA head and neck done with normal intracranial MRA.  Severely technically degraded MRA of the neck essentially nondiagnostic. -2D echo with EF of 65 to 70%,NWMA,  mild LVH, mildly reduced right ventricular systolic function, mildly dilated left atrial size, trivial MVR, no atrial level shunt detected by color-flow Doppler. -Preliminary carotid ultrasound done with bilateral 40 to 59% ICA stenosis which may be underestimated secondary to bulky plaque and vessel tortuosity.  Right vertebral artery demonstrated antegrade flow.  Left vertebral artery demonstrates an occlusion. -Fasting lipid panel with LDL of 35. -Hemoglobin A1c 5.5. -Patient was on IV heparin for anticoagulation. -Patient active with no falls in discussion with daughter. -Patient has been transitioned to Eliquis. -Continue statin. -Aspirin discontinued. -PT/OT/SLP. -Neurology consulted and following and appreciate input and recommendations.  2.  New onset A-fib -Patient noted to be on new onset A-fib on admission and had presented with acute CVA. -CHA2DS2VASC score is 6. -TSH within normal limits. -2D echo with EF of 65 to 70%, mildly reduced RV function, mildly dilated left atrial size,NWMA. -Currently rate controlled on carvedilol 12.5 mg twice daily. -Was on IV heparin on admission and has been transitioned to Eliquis 2.5 mg twice daily due to age and renal function per cardiology recommendations. -Per cardiology once stable and after around 3 to 4 weeks may consider DCCV at that time if patient still in A-fib. -Will need outpatient follow-up with cardiology. -Appreciate cardiology input and recommendations.  3.  Hypertension -Permissive hypertension. -Continue current regimen of Coreg. -Continue to hold home regimen of hydralazine, Imdur, Norvasc and could potentially start resuming dose in the next 24 to 48 hours.  4.  CAD status post CABG (2001) -Currently stable and asymptomatic. -Continue Eliquis, statin, beta-blocker. -Could likely resume cardiac regimen of Imdur, hydralazine, Norvasc hopefully in the next 24 to 48 hours.  5.  History of prostate cancer -Outpatient  follow-up.  6.  Abdominal pain, dysphagia//nausea and vomiting/history of mesenteric artery stenosis/?  Proctitis -Patient seen in consultation by GI who are recommending empiric PPI daily, sucralfate 1 g p.o. 4 times daily with no plans for endoscopic intervention at this time and outpatient follow-up with GI if recurrence of symptoms.  7.  Recent diagnosis of E. coli UTI -Urine cultures from 03/09/2023 resistant to ciprofloxacin otherwise pansensitive. -Continue IV Rocephin. -Discontinue Flagyl.  8.  CKD stage IV -Stable.   DVT prophylaxis: Heparin Code Status: Full Family Communication: Updated patient, daughter, son and daughter-in-law at bedside. Disposition: TBD  Status is: Inpatient Remains inpatient appropriate because: Severity of illness   Consultants:  GI: Dr. Lorenso Quarry 03/14/2023 Neurology: Dr.Khaliqdina 03/14/2023 Cardiology: Dr. Rennis Golden 03/15/2023  Procedures:  CT head 03/13/2023 MRI brain 03/13/2023 MRA head and neck 03/14/2023 Carotid ultrasound pending 03/15/2023 2D echo 03/14/2023   Antimicrobials:  Anti-infectives (From admission, onward)    Start     Dose/Rate Route Frequency Ordered Stop   03/14/23 0115  metroNIDAZOLE (FLAGYL) IVPB 500 mg        500 mg 100 mL/hr over 60 Minutes Intravenous 2 times daily 03/14/23 0055 03/20/23 2159   03/14/23 0100  cefTRIAXone (ROCEPHIN) 2 g in sodium chloride 0.9 % 100 mL IVPB        2 g 200 mL/hr over 30 Minutes Intravenous Every 24 hours 03/14/23 0055 03/21/23 0059         Subjective: Patient laying in bed.  Son, daughter-in-law, daughter at bedside.  Patient denies any chest pain or shortness of breath.  Feels left upper extremity and left lower extremity weakness and numbness slowly improving.  Denies any history of falls.  Per daughter patient mows the lawn and gets on a tractor trailer.  Objective: Vitals:   03/15/23 0452 03/15/23 0912 03/15/23 1139 03/15/23 1521  BP: (!) 166/91 (!) 144/59 118/66 (!) 165/77  Pulse: 90   74 84  Resp: 18     Temp: (!) 97.5 F (36.4 C) (!) 97.5 F (36.4 C) 97.8 F (36.6 C) (!) 97.3 F (36.3 C)  TempSrc: Oral Oral Axillary Axillary  SpO2: 97% 100% 100% 98%    Intake/Output Summary (Last 24 hours) at 03/15/2023 1552 Last data filed at 03/15/2023 0900 Gross per 24 hour  Intake --  Output 700 ml  Net -700 ml   There were no vitals filed for this visit.  Examination:  General exam: Appears calm and comfortable.  Dry mucous membranes Respiratory system: Clear to auscultation, no wheezes, no crackles, no rhonchi.Marland Kitchen Respiratory effort normal. Cardiovascular system: Irregularly irregular.  No JVD, no murmurs rubs or gallops.  No pitting lower extremity edema. Gastrointestinal system: Abdomen is nondistended, soft and nontender. No organomegaly or masses felt. Normal bowel sounds heard. Central nervous system: Alert and oriented. No focal neurological deficits. Extremities: Symmetric 5 x 5 power. Skin: No rashes, lesions or ulcers Psychiatry: Judgement and insight appear fair to normal. Mood & affect appropriate.     Data Reviewed: I have personally reviewed following labs and imaging studies  CBC: Recent Labs  Lab 03/09/23 0735 03/13/23 1207 03/14/23 0323 03/15/23 0557  WBC 12.5* 7.6 8.3 8.9  NEUTROABS 9.2* 4.3  --   --   HGB 9.3* 9.7* 9.9* 10.1*  HCT 28.9* 30.2* 30.3* 30.9*  MCV 93.2 94.7 93.5 92.2  PLT 268 294 296 307    Basic Metabolic Panel: Recent Labs  Lab 03/09/23 0735 03/13/23 1207 03/14/23 0323  03/15/23 0944  NA 132* 133*  --  135  K 3.5 4.5  --  4.0  CL 96* 100  --  102  CO2 25 22  --  21*  GLUCOSE 134* 117*  --  114*  BUN 27* 20  --  16  CREATININE 2.17* 2.26* 2.08* 2.01*  CALCIUM 9.1 8.7*  --  9.2  MG  --   --   --  2.0  PHOS  --   --   --  3.6    GFR: Estimated Creatinine Clearance: 27.2 mL/min (A) (by C-G formula based on SCr of 2.01 mg/dL (H)).  Liver Function Tests: Recent Labs  Lab 03/09/23 0735 03/15/23 0944  AST 22  --    ALT 22  --   ALKPHOS 65  --   BILITOT 0.8  --   PROT 6.9  --   ALBUMIN 3.8 3.5    CBG: Recent Labs  Lab 03/13/23 1115 03/15/23 0911  GLUCAP 102* 99     Recent Results (from the past 240 hours)  Resp panel by RT-PCR (RSV, Flu A&B, Covid) Anterior Nasal Swab     Status: None   Collection Time: 03/09/23  7:16 AM   Specimen: Anterior Nasal Swab  Result Value Ref Range Status   SARS Coronavirus 2 by RT PCR NEGATIVE NEGATIVE Final    Comment: (NOTE) SARS-CoV-2 target nucleic acids are NOT DETECTED.  The SARS-CoV-2 RNA is generally detectable in upper respiratory specimens during the acute phase of infection. The lowest concentration of SARS-CoV-2 viral copies this assay can detect is 138 copies/mL. A negative result does not preclude SARS-Cov-2 infection and should not be used as the sole basis for treatment or other patient management decisions. A negative result may occur with  improper specimen collection/handling, submission of specimen other than nasopharyngeal swab, presence of viral mutation(s) within the areas targeted by this assay, and inadequate number of viral copies(<138 copies/mL). A negative result must be combined with clinical observations, patient history, and epidemiological information. The expected result is Negative.  Fact Sheet for Patients:  BloggerCourse.com  Fact Sheet for Healthcare Providers:  SeriousBroker.it  This test is no t yet approved or cleared by the [place] FDA and  has been authorized for detection and/or diagnosis of SARS-CoV-2 by FDA under an Emergency Use Authorization (EUA). This EUA will remain  in effect (meaning this test can be used) for the duration of the COVID-19 declaration under Section 564(b)(1) of the Act, 21 U.S.C.section 360bbb-3(b)(1), unless the authorization is terminated  or revoked sooner.       Influenza A by PCR NEGATIVE NEGATIVE Final   Influenza B  by PCR NEGATIVE NEGATIVE Final    Comment: (NOTE) The Xpert Xpress SARS-CoV-2/FLU/RSV plus assay is intended as an aid in the diagnosis of influenza from Nasopharyngeal swab specimens and should not be used as a sole basis for treatment. Nasal washings and aspirates are unacceptable for Xpert Xpress SARS-CoV-2/FLU/RSV testing.  Fact Sheet for Patients: BloggerCourse.com  Fact Sheet for Healthcare Providers: SeriousBroker.it  This test is not yet approved or cleared by the [place] FDA and has been authorized for detection and/or diagnosis of SARS-CoV-2 by FDA under an Emergency Use Authorization (EUA). This EUA will remain in effect (meaning this test can be used) for the duration of the COVID-19 declaration under Section 564(b)(1) of the Act, 21 U.S.C. section 360bbb-3(b)(1), unless the authorization is terminated or revoked.     Resp Syncytial Virus by PCR NEGATIVE  NEGATIVE Final    Comment: (NOTE) Fact Sheet for Patients: BloggerCourse.com  Fact Sheet for Healthcare Providers: SeriousBroker.it  This test is not yet approved or cleared by the [place] FDA and has been authorized for detection and/or diagnosis of SARS-CoV-2 by FDA under an Emergency Use Authorization (EUA). This EUA will remain in effect (meaning this test can be used) for the duration of the COVID-19 declaration under Section 564(b)(1) of the Act, 21 U.S.C. section 360bbb-3(b)(1), unless the authorization is terminated or revoked.  Performed at [facility], [address] [place], [place] [address]   Urine Culture     Status: Abnormal   Collection Time: 03/09/23 11:35 AM   Specimen: Urine, Clean Catch  Result Value Ref Range Status   Specimen Description   Final    URINE, CLEAN CATCH Performed at [facility], [address] [place], [place] [address]     Special Requests   Final    NONE Performed at [facility], [address], [place], [place] [address]    Culture >=100,000 COLONIES/mL ESCHERICHIA COLI (A)  Final   Report Status 03/11/2023 FINAL  Final   Organism ID, Bacteria ESCHERICHIA COLI (A)  Final      Susceptibility   Escherichia coli - MIC*    AMPICILLIN <=2 SENSITIVE Sensitive     CEFAZOLIN <=4 SENSITIVE Sensitive     CEFEPIME <=0.12 SENSITIVE Sensitive     CEFTRIAXONE <=0.25 SENSITIVE Sensitive     CIPROFLOXACIN >=4 RESISTANT Resistant     GENTAMICIN <=1 SENSITIVE Sensitive     IMIPENEM <=0.25 SENSITIVE Sensitive     NITROFURANTOIN <=16 SENSITIVE Sensitive     TRIMETH/SULFA <=20 SENSITIVE Sensitive     AMPICILLIN/SULBACTAM <=2 SENSITIVE Sensitive     PIP/TAZO <=4 SENSITIVE Sensitive ug/mL    * >=100,000 COLONIES/mL ESCHERICHIA COLI         Radiology Studies: VAS US CAROTID Result Date: 03/15/2023 Carotid Arterial Duplex Study Patient Name:  KEATEN MASHEK [facility]  Date of Exam:   03/15/2023 Medical Rec #: [id number]      Accession #:    [id number] Date of Birth: 15-Oct-1928     Patient Gender: M Patient Age:   19 years Exam Location:  [facility] Procedure:      VAS US CAROTID Referring Phys: Terrilee Files [facility] --------------------------------------------------------------------------------  Indications:       CVA, Carotid artery disease and Numbness. Risk Factors:      Hypertension, hyperlipidemia, no history of smoking, prior                    MI, coronary artery disease, prior CVA. Other Factors:     CABG X 5, new atrial fibrillation. Limitations        Today's exam was limited due to vessel tortuosity, heavy                    calcification resulting in significant shadowing, and                    respiratory variation. Comparison Study:  Prior carotid duplex done 03/19/22 at [facility] indicating                    1-39% ICA stenosis, bilaterally. Patient has a long history                    of technically  difficult carotid studies limited by calcific  plaque, acoustic shadowing, and vessel tortuoisity with                    varying results from study to study. Performing Technologist: Sherren Kerns RVS  Examination Guidelines: A complete evaluation includes B-mode imaging, spectral Doppler, color Doppler, and power Doppler as needed of all accessible portions of each vessel. Bilateral testing is considered an integral part of a complete examination. Limited examinations for reoccurring indications may be performed as noted.  Right Carotid Findings: +----------+--------+--------+--------+----------------------+---------+           PSV cm/sEDV cm/sStenosisPlaque Description    Comments  +----------+--------+--------+--------+----------------------+---------+ CCA Prox  40      13              irregular and calcific          +----------+--------+--------+--------+----------------------+---------+ CCA Distal66      22              irregular and calcific          +----------+--------+--------+--------+----------------------+---------+ ICA Prox  182     37      40-59%  calcific              Shadowing +----------+--------+--------+--------+----------------------+---------+ ICA Mid   54      15                                    tortuous  +----------+--------+--------+--------+----------------------+---------+ ICA Distal47      23                                    tortuous  +----------+--------+--------+--------+----------------------+---------+ ECA       66      9                                               +----------+--------+--------+--------+----------------------+---------+ +----------+--------+-------+--------+-------------------+           PSV cm/sEDV cmsDescribeArm Pressure (mmHG) +----------+--------+-------+--------+-------------------+ [id number]                                          +----------+--------+-------+--------+-------------------+ +---------+--------+--+--------+--+---------+ VertebralPSV cm/s55EDV cm/s15Antegrade +---------+--------+--+--------+--+---------+  Left Carotid Findings: +----------+--------+--------+--------+------------------+--------+           PSV cm/sEDV cm/sStenosisPlaque DescriptionComments +----------+--------+--------+--------+------------------+--------+ CCA Prox  60      9                                          +----------+--------+--------+--------+------------------+--------+ CCA Distal61      3                                          +----------+--------+--------+--------+------------------+--------+ ICA Prox  163     40      40-59%                    tortuous +----------+--------+--------+--------+------------------+--------+ ICA Mid   105     23  tortuous +----------+--------+--------+--------+------------------+--------+ ICA Distal                                          tortuous +----------+--------+--------+--------+------------------+--------+ ECA       130     13                                         +----------+--------+--------+--------+------------------+--------+ +----------+--------+--------+--------+-------------------+           PSV cm/sEDV cm/sDescribeArm Pressure (mmHG) +----------+--------+--------+--------+-------------------+ [id number]                                          +----------+--------+--------+--------+-------------------+ +---------+--------+--------+--------+ VertebralPSV cm/sEDV cm/soccluded +---------+--------+--------+--------+   Summary: Right Carotid: Velocities in the right ICA are consistent with a 40-59%                stenosis. Stenosis may be underestimated secondary to bulky                plaque and vessel tortuosity. Left Carotid: Velocities in the left ICA are consistent with a 40-59% stenosis.                Stenosis may be underestimated secondary to bulky plaque and               vessel tortuosity. Vertebrals:  Right vertebral artery demonstrates antegrade flow. Left vertebral              artery demonstrates an occlusion. Subclavians: Normal flow hemodynamics were seen in bilateral subclavian              arteries. *See table(s) above for measurements and observations.  Electronically signed by Sherald Hess MD on 03/15/2023 at 3:19:04 PM.    Final    MR ANGIO NECK WO CONTRAST Result Date: 03/14/2023 CLINICAL DATA:  Acute neurologic deficit EXAM: MRA NECK WITHOUT CONTRAST TECHNIQUE: Angiographic images of the neck were acquired using MRA technique without intravenous contrast. Carotid stenosis measurements (when applicable) are obtained utilizing NASCET criteria, using the distal internal carotid diameter as the denominator. COMPARISON:  None Available. FINDINGS: Severely technically degraded MRA. There is no flow related enhancement in any vessel, which is likely artifactual. There is a 3 vessel aortic arch branching pattern. The study is essentially nondiagnostic. IMPRESSION: Severely technically degraded MRA, essentially nondiagnostic. Electronically Signed   By: Deatra Robinson M.D.   On: 03/14/2023 21:41   MR ANGIO HEAD WO CONTRAST Result Date: 03/14/2023 CLINICAL DATA:  Acute neurologic deficit EXAM: MRA HEAD WITHOUT CONTRAST TECHNIQUE: Angiographic images of the Circle of Willis were acquired using MRA technique without intravenous contrast. COMPARISON:  None Available. FINDINGS: POSTERIOR CIRCULATION: Vertebral arteries are normal. No proximal occlusion of the anterior or inferior cerebellar arteries. Basilar artery is diminutive distally. Superior cerebellar arteries are normal. Posterior cerebral arteries are normal. ANTERIOR CIRCULATION: Intracranial internal carotid arteries are normal. Anterior cerebral arteries are normal. Middle cerebral arteries are normal. Anatomic Variants: Fetal  predominant origins of the posterior cerebral arteries. IMPRESSION: Normal intracranial MRA. Electronically Signed   By: Deatra Robinson M.D.   On: 03/14/2023 21:23   ECHOCARDIOGRAM COMPLETE Result Date: 03/14/2023    ECHOCARDIOGRAM REPORT   Patient Name:   Brandon  Willaim Harris Date of Exam: 03/14/2023 Medical Rec #:  [id number]     Height:       71.0 in Accession #:    [id number]    Weight:       203.0 lb Date of Birth:  12-19-1928    BSA:          2.122 m Patient Age:    89 years      BP:           130/99 mmHg Patient Gender: M             HR:           75 bpm. Exam Location:  Inpatient Procedure: 2D Echo, Color Doppler, Cardiac Doppler and Intracardiac            Opacification Agent Indications:    Stroke I63.9  History:        Patient has prior history of Echocardiogram examinations, most                 recent 07/26/2019.  Sonographer:    Harriette Bouillon RDCS Referring Phys: [id number] ARSHAD N KAKRAKANDY IMPRESSIONS  1. Left ventricular ejection fraction, by estimation, is 65 to 70%. Left ventricular ejection fraction by PLAX is 66 %. The left ventricle has normal function. The left ventricle has no regional wall motion abnormalities. There is mild left ventricular hypertrophy. Left ventricular diastolic function could not be evaluated.  2. Right ventricular systolic function is mildly reduced. The right ventricular size is normal.  3. Left atrial size was mildly dilated.  4. The mitral valve is abnormal. Trivial mitral valve regurgitation.  5. The aortic valve is tricuspid. There is mild calcification of the aortic valve. Aortic valve regurgitation is trivial. Aortic valve sclerosis/calcification is present, without any evidence of aortic stenosis.  6. Aortic dilatation noted. There is borderline dilatation of the ascending aorta, measuring 38 mm.  7. Rhythm strip during this exam demonstrates atrial fibrillation. Comparison(s): Prior images unable to be directly viewed, comparison made by report only. Changes from prior study  are noted. 07/26/2019: LVEF 55-60%, normal RV systolic function. FINDINGS  Left Ventricle: Left ventricular ejection fraction, by estimation, is 65 to 70%. Left ventricular ejection fraction by PLAX is 66 %. The left ventricle has normal function. The left ventricle has no regional wall motion abnormalities. The left ventricular internal cavity size was normal in size. There is mild left ventricular hypertrophy. Left ventricular diastolic function could not be evaluated due to atrial fibrillation. Left ventricular diastolic function could not be evaluated. Right Ventricle: The right ventricular size is normal. No increase in right ventricular wall thickness. Right ventricular systolic function is mildly reduced. Left Atrium: Left atrial size was mildly dilated. Right Atrium: Right atrial size was normal in size. Pericardium: There is no evidence of pericardial effusion. Mitral Valve: The mitral valve is abnormal. Mild mitral annular calcification. Trivial mitral valve regurgitation. Tricuspid Valve: The tricuspid valve is grossly normal. Tricuspid valve regurgitation is mild. Aortic Valve: The aortic valve is tricuspid. There is mild calcification of the aortic valve. Aortic valve regurgitation is trivial. Aortic valve sclerosis/calcification is present, without any evidence of aortic stenosis. Pulmonic Valve: The pulmonic valve was normal in structure. Pulmonic valve regurgitation is not visualized. Aorta: Aortic dilatation noted. There is borderline dilatation of the ascending aorta, measuring 38 mm. IAS/Shunts: No atrial level shunt detected by color flow Doppler. EKG: Rhythm strip during this exam demonstrates atrial fibrillation.  LEFT VENTRICLE PLAX 2D LV  EF:         Left ventricular ejection fraction by PLAX is 66 %. LVIDd:         5.20 cm LVIDs:         3.30 cm LV PW:         1.20 cm LV IVS:        1.10 cm LVOT diam:     2.40 cm LV SV:         81 LV SV Index:   38 LVOT Area:     4.52 cm  LEFT ATRIUM              Index LA diam:        5.40 cm 2.54 cm/m LA Vol (A2C):   74.1 ml 34.92 ml/m LA Vol (A4C):   73.9 ml 34.83 ml/m LA Biplane Vol: 74.6 ml 35.16 ml/m  AORTIC VALVE LVOT Vmax:   83.60 cm/s LVOT Vmean:  54.700 cm/s LVOT VTI:    0.179 m  AORTA Ao Root diam: 3.30 cm Ao Asc diam:  3.80 cm TRICUSPID VALVE TR Peak grad:   23.6 mmHg TR Vmax:        243.00 cm/s  SHUNTS Systemic VTI:  0.18 m Systemic Diam: 2.40 cm Zoila Shutter MD Electronically signed by Zoila Shutter MD Signature Date/Time: 03/14/2023/1:17:21 PM    Final    MR BRAIN WO CONTRAST Result Date: 03/13/2023 CLINICAL DATA:  Neuro deficit, acute, stroke suspected. Left upper extremity numbness. Difficulty walking. EXAM: MRI HEAD WITHOUT CONTRAST TECHNIQUE: Multiplanar, multiecho pulse sequences of the brain and surrounding structures were obtained without intravenous contrast. COMPARISON:  Head CT 03/13/2023 FINDINGS: Brain: Scattered small acute cortical and subcortical infarcts are present in the posterior right frontal and anterior right parietal lobes (MCA territory). Several chronic microhemorrhages are noted in the cerebral and cerebellar hemispheres. Patchy to confluent T2 hyperintensities in the cerebral white matter bilaterally are nonspecific but compatible with moderate to severe chronic small vessel ischemic disease. Small chronic infarcts are present in the right occipital lobe, both cerebellar hemispheres, right pons, thalami, and bilateral basal ganglia. There is moderate cerebral and cerebellar atrophy. No mass, midline shift, or extra-axial fluid collection is identified. Vascular: Absent flow void in the distal left vertebral artery. Skull and upper cervical spine: Unremarkable bone marrow signal. Sinuses/Orbits: Bilateral cataract extraction. Opacification of a single posterior left ethmoid air cell. No significant mastoid fluid. Other: None. IMPRESSION: 1. Small acute right frontoparietal infarcts. 2. 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 3. 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 Electronically Signed   By: Sebastian Ache M.D.   On: 03/13/2023 17:31        Scheduled Meds:  apixaban  2.5 mg Oral BID   atorvastatin  80 mg Oral QPM   carvedilol  12.5 mg Oral BID WC   pantoprazole  40 mg Oral Daily   sucralfate  1 g Oral TID WC & HS   Continuous Infusions:  cefTRIAXone (ROCEPHIN)  IV 2 g (03/15/23 0019)   metronidazole 500 mg (03/15/23 1000)     LOS: 1 day    Time spent: 40 minutes    Ramiro Harvest, MD [facility]   To contact the attending provider between 7A-7P or the covering provider during after hours 7P-7A, please log into the web site [email or web address] and access using universal [place] password for that web site. If you do not have the password, please call the hospital  operator.  03/15/2023, 3:52 PM

## 2023-03-15 NOTE — Progress Notes (Signed)
PHARMACY - ANTICOAGULATION CONSULT NOTE  Pharmacy Consult for Heparin Indication: atrial fibrillation  Patient Measurements:   Heparin Dosing Weight: 92.1 KG  Vital Signs: Temp: 97.5 F (36.4 C) (02/03 0912) Temp Source: Oral (02/03 0912) BP: 144/59 (02/03 0912) Pulse Rate: 90 (02/03 0452)  Labs: Recent Labs    03/13/23 1207 03/14/23 0323 03/14/23 0506 03/14/23 1440 03/14/23 2325 03/15/23 0557  HGB 9.7* 9.9*  --   --   --  10.1*  HCT 30.2* 30.3*  --   --   --  30.9*  PLT 294 296  --   --   --  307  HEPARINUNFRC  --   --   --  0.34 0.26* 0.56  CREATININE 2.26* 2.08*  --   --   --   --   TROPONINIHS  --  15 18*  --   --   --     Estimated Creatinine Clearance: 26.3 mL/min (A) (by C-G formula based on SCr of 2.08 mg/dL (H)).  Assessment: 89 yo M presenting with acute CVA (small right frontoparietal infarct per MRI, no bleeding per CT) and new onset Afib.  OK to start heparin per neurologist.  Will dose conservatively given acute stroke (no bolus, low goal 0.3-0.5). Baseline labs- Hg 9.9- low/stable, pltc 296-WNL, Scr elevated at 2.26 (patient's baseline ~2.0)  -Heparin level 0.26 (subtherapeutic) on 1100 units/hr. Per RN, no issues with the heparin infusion running continuously or signs/symptoms of bleeding   2/3 AM update: HL 0.56 Hgb 10.1 / PLT wnl No signs of bleeding or issues with heparin gtt   Goal of Therapy:  Heparin level 0.3-0.5 Monitor platelets by anticoagulation protocol: Yes   Plan:  Decrease IV heparin infusion to 1100 units/hr Check 8h heparin level Daily heparin level & CBC while on heparin F/U long-term anticoagulation plans   Greta Doom BS, PharmD, BCPS Clinical Pharmacist 03/15/2023 10:11 AM  Contact: [phone] after 3 PM  "Be curious, not judgmental..." -Debbora Dus

## 2023-03-15 NOTE — Progress Notes (Signed)
PHARMACY - ANTICOAGULATION CONSULT NOTE  Pharmacy Consult for Heparin Indication: atrial fibrillation  Patient Measurements:   Heparin Dosing Weight: TBW  Vital Signs: Temp: 97.7 F (36.5 C) (02/02 2021) Temp Source: Oral (02/02 2021) BP: 153/71 (02/02 2021) Pulse Rate: 91 (02/02 2021)  Labs: Recent Labs    03/13/23 1207 03/14/23 0323 03/14/23 0506 03/14/23 1440 03/14/23 2325  HGB 9.7* 9.9*  --   --   --   HCT 30.2* 30.3*  --   --   --   PLT 294 296  --   --   --   HEPARINUNFRC  --   --   --  0.34 0.26*  CREATININE 2.26* 2.08*  --   --   --   TROPONINIHS  --  15 18*  --   --     Estimated Creatinine Clearance: 26.3 mL/min (A) (by C-G formula based on SCr of 2.08 mg/dL (H)).  Assessment: 89 yo M presenting with acute CVA (small right frontoparietal infarct per MRI, no bleeding per CT) and new onset Afib.  OK to start heparin per neurologist.  Will dose conservatively given acute stroke (no bolus, low goal 0.3-0.5). Baseline labs- Hg 9.9- low/stable, pltc 296-WNL, Scr elevated at 2.26 (patient's baseline ~2.0)  -Heparin level 0.26 (subtherapeutic) on 1100 units/hr. Per RN, no issues with the heparin infusion running continuously or signs/symptoms of bleeding   Goal of Therapy:  Heparin level 0.3-0.5 Monitor platelets by anticoagulation protocol: Yes   Plan:  Increase IV heparin infusion to 1200 units/hr.  No bolus.  Check 8h heparin level Daily heparin level & CBC while on heparin F/U long-term anticoagulation plans  Arabella Merles, PharmD. Clinical Pharmacist 03/15/2023 12:11 AM

## 2023-03-15 NOTE — Evaluation (Signed)
Occupational Therapy Evaluation Patient Details Name: Brandon Harris MRN: [id number] DOB: 10/30/1928 Today's Date: 03/15/2023   History of Present Illness Pt is a 89 y/o M admitted on 03/13/23 after presenting with c/o LUE & LLE numbness x 48 hours. MRI showed acute R frontotemporoparietal stroke. Pt also with new onset of a-fib rate controlled. Of note, pt was seen in the ED 4 days prior with c/o abdominal pain; CT concerning for proctitis & pt was d/c home on Augmentin. PMH: CAD s/p CABG, HTN, CKD IV, stroke, mesenteric artery stenosis, prostate CA, MI, urinary incontinence and B rotator cuff repairs   Clinical Impression   Patients prior level of function was independent for BADLS, driving and working in his yard.  He currently presents at moderate level of assist with BADLs mostly due to decreased LLE control and SOB with activity/ fatigue.  Feel patient will benefit from > 3 hrs of skilled therapy per day to maximize functional level of Independence and facilitate safe D/C home with supportive family.  Acute OT to follow .  HR ranged from below 80s to 104.      If plan is discharge home, recommend the following: A little help with walking and/or transfers;A little help with bathing/dressing/bathroom;Assistance with cooking/housework;Help with stairs or ramp for entrance    Functional Status Assessment  Patient has had a recent decline in their functional status and demonstrates the ability to make significant improvements in function in a reasonable and predictable amount of time.  Equipment Recommendations  None recommended by OT    Recommendations for Other Services       Precautions / Restrictions Precautions Precautions: Fall Restrictions Weight Bearing Restrictions Per Provider Order: No      Mobility Bed Mobility               General bed mobility comments: Patient in chair upon arrival    Transfers Overall transfer level: Needs assistance Equipment used: Rolling  walker (2 wheels) Transfers: Sit to/from Stand Sit to Stand: Min assist           General transfer comment: Patient was able to scoot forward and back in chair with min verbal cues and assist to keep L foot in place to push.      Balance Overall balance assessment: Needs assistance Sitting-balance support: Feet supported, No upper extremity supported   Sitting balance - Comments: Able to reach to feet in chair with no difficulty - supervision for edge of chair sitting   Standing balance support: Bilateral upper extremity supported, Reliant on assistive device for balance                               ADL either performed or assessed with clinical judgement   ADL Overall ADL's : Needs assistance/impaired Eating/Feeding: Set up Eating/Feeding Details (indicate cue type and reason): Positioning will need to be upright due to lack of shoulder ROM Grooming: Minimal assistance   Upper Body Bathing: Minimal assistance   Lower Body Bathing: Moderate assistance   Upper Body Dressing : Moderate assistance   Lower Body Dressing: Moderate assistance   Toilet Transfer: Moderate assistance   Toileting- Clothing Manipulation and Hygiene: Moderate assistance       Functional mobility during ADLs: Moderate assistance General ADL Comments: Patient needing cueing for safe use of walker, noted ataxic movements in LLE with stepping forward or backward with a tendency to flex at the hips with backward stepping.  Also noted hyperextension of the LLE.  Patient SOB with standing <5 minutes.  Patient was able to release BUE in standing and / or use BUE for support     Vision Ability to See in Adequate Light: 0 Adequate Patient Visual Report: No change from baseline       Perception         Praxis Praxis: Not tested       Pertinent Vitals/Pain Pain Assessment Pain Assessment: No/denies pain (Simultaneous filing. User may not have seen previous data.)      Extremity/Trunk Assessment Upper Extremity Assessment Upper Extremity Assessment: LUE deficits/detail;RUE deficits/detail;Right hand dominant RUE Deficits / Details: hx of R index finger amputation, Patient with no active should or adduction due to rotator cuff injury LUE Deficits / Details: no active shoulder flexion, grip strength 4/5   Lower Extremity Assessment Lower Extremity Assessment: Defer to PT evaluation   Cervical / Trunk Assessment Cervical / Trunk Assessment: Back Surgery;Kyphotic   Communication Communication Communication: Difficulty communicating thoughts/reduced clarity of speech   Cognition Arousal: Alert Behavior During Therapy: WFL for tasks assessed/performed Overall Cognitive Status: Within Functional Limits for tasks assessed (Simultaneous filing. User may not have seen previous data.)                                       General Comments  Pt assisted with meal tray set up as pt limited by decreased BUE shoulder ROM.    Exercises     Shoulder Instructions      Home Living Family/patient expects to be discharged to:: Private residence Living Arrangements: Children Available Help at Discharge: Family;Available 24 hours/day (Simultaneous filing. User may not have seen previous data.) Type of Home: House (Simultaneous filing. User may not have seen previous data.) Home Access: Stairs to enter;Ramped entrance Entrance Stairs-Number of Steps: 3 in garage Entrance Stairs-Rails: Right Home Layout: One level     Bathroom Shower/Tub: Tub/shower unit         Home Equipment: Agricultural consultant (2 wheels);Rollator (4 wheels);Cane - single point   Additional Comments: Patient reports he prefers to take tub baths and has grab bars he uses to get in and out of the tub.      Prior Functioning/Environment Prior Level of Function : Driving;Needs assist       Physical Assist : ADLs (physical)   ADLs (physical): IADLs;Dressing Mobility  Comments: Pt reports he needs PRN assist for bed mobility, transfers, gait. Pt reports he's ambulatory with rollator, denies falls, still drives to church. ADLs Comments: Patient needs occsional assist with donning/ doffing jackets or long sleeve shirts        OT Problem List: Decreased strength;Decreased range of motion;Decreased activity tolerance;Impaired balance (sitting and/or standing);Impaired UE functional use      OT Treatment/Interventions: Self-care/ADL training;Therapeutic activities;Patient/family education;Balance training    OT Goals(Current goals can be found in the care plan section) Acute Rehab OT Goals Patient Stated Goal: To go home OT Goal Formulation: With patient/family Time For Goal Achievement: 03/29/23 Potential to Achieve Goals: Good ADL Goals Pt Will Transfer to Toilet: with contact guard assist;ambulating Additional ADL Goal #1: Patient will demonstrate ability to stand x 15 minutes during functional activity with no rest breaks Additional ADL Goal #2: Patient will demonstrate ability to use pursed lip breathing during ADLs with minimum verbal cues  OT Frequency: Min 1X/week    Co-evaluation  AM-PAC OT "6 Clicks" Daily Activity     Outcome Measure Help from another person eating meals?: A Little Help from another person taking care of personal grooming?: A Little Help from another person toileting, which includes using toliet, bedpan, or urinal?: A Lot Help from another person bathing (including washing, rinsing, drying)?: A Lot Help from another person to put on and taking off regular upper body clothing?: A Lot Help from another person to put on and taking off regular lower body clothing?: A Lot 6 Click Score: 14   End of Session Equipment Utilized During Treatment: Rolling walker (2 wheels) Nurse Communication: Other (comment) (recommendations)  Activity Tolerance: Patient limited by fatigue (reported very limited sleep since  Friday) Patient left: in chair;with call bell/phone within reach;with chair alarm set  OT Visit Diagnosis: Muscle weakness (generalized) (M62.81);Ataxia, unspecified (R27.0)                Time: 4132-4401 OT Time Calculation (min): 26 min Charges:  OT General Charges $OT Visit: 1 Visit OT Evaluation $OT Eval Low Complexity: 1 Low OT Treatments $Therapeutic Activity: 8-22 mins  Hal Neer OTR/L   Malachi Bonds 03/15/2023, 12:47 PM

## 2023-03-15 NOTE — Evaluation (Signed)
Physical Therapy Evaluation Patient Details Name: Brandon Harris MRN: [id number] DOB: Jun 19, 1928 Today's Date: 03/15/2023  History of Present Illness  Pt is a 89 y/o M admitted on 03/13/23 after presenting with c/o LUE & LLE numbness x 48 hours. MRI showed acute R frontotemporoparietal stroke. Pt also with new onset of a-fib rate controlled. Of note, pt was seen in the ED 4 days prior with c/o abdominal pain; CT concerning for proctitis & pt was d/c home on Augmentin. PMH: CAD s/p CABG, HTN, CKD IV, stroke, mesenteric artery stenosis, prostate CA, MI, urinary incontinence  Clinical Impression  Pt seen for PT evaluation with pt agreeable to tx. Pt reports prior to admission he was ambulatory with rollator, living with his daughter in a 1 level home with 3 steps with R rail or ramp to enter, noting he required some assistance for mobility & ADLs.  On this date, pt requires min assist for bed mobility with hospital bed features, min<>mod assist for STS from EOB, & mod assist for gait as pt presents with impaired gait pattern noted below & decreased neuromuscular control & strength in LLE. Pt would benefit from ongoing PT services to address balance, strengthening, to increase independence with gait & mobility & reduce fall risk.      If plan is discharge home, recommend the following: A lot of help with walking and/or transfers;A lot of help with bathing/dressing/bathroom;Assist for transportation;Assistance with feeding;Assistance with cooking/housework;Help with stairs or ramp for entrance   Can travel by private vehicle   No    Equipment Recommendations Other (comment) (defer to next venue)  Recommendations for Other Services       Functional Status Assessment Patient has had a recent decline in their functional status and demonstrates the ability to make significant improvements in function in a reasonable and predictable amount of time.     Precautions / Restrictions Precautions Precautions:  Fall Restrictions Weight Bearing Restrictions Per Provider Order: No      Mobility  Bed Mobility Overal bed mobility: Needs Assistance Bed Mobility: Supine to Sit     Supine to sit: Min assist, HOB elevated, Used rails     General bed mobility comments: to exit R side of bed    Transfers Overall transfer level: Needs assistance Equipment used: Rolling walker (2 wheels) Transfers: Sit to/from Stand Sit to Stand: Min assist, Mod assist           General transfer comment: decreased awareness of hand placement when using RW vs rollator as pt with BUE on RW, decreased ability to power up & decreased balance as pt requires multiple attempts prior to successful STS from EOB & pt leaning posteriorly on bed with BLE for support.    Ambulation/Gait Ambulation/Gait assistance: Mod assist Gait Distance (Feet): 14 Feet Assistive device: Rolling walker (2 wheels) Gait Pattern/deviations: Decreased step length - right, Decreased step length - left, Decreased stride length, Decreased dorsiflexion - right, Decreased dorsiflexion - left Gait velocity: decreased     General Gait Details: Pt pushing RW out in front of him. Pt with difficulty advancing LLE at times, sometimes dragging foot, pt when pt exaggerates movement pt is able to advance LLE with increased hip/knee flexion during swing phase but PT does provide assistance ~4 times for L foot advancement. PT also provides some assistance for RW management when turning.  Stairs            Wheelchair Mobility     Tilt Bed    Modified  Rankin (Stroke Patients Only)       Balance Overall balance assessment: Needs assistance Sitting-balance support: No upper extremity supported, Feet supported Sitting balance-Leahy Scale: Fair Sitting balance - Comments: supervision static sitting EOB   Standing balance support: Bilateral upper extremity supported, Reliant on assistive device for balance, During functional activity Standing  balance-Leahy Scale: Fair                               Pertinent Vitals/Pain Pain Assessment Pain Assessment: No/denies pain    Home Living Family/patient expects to be discharged to:: Private residence Living Arrangements: Children Available Help at Discharge: Family;Available 24 hours/day (daughter) Type of Home: House Home Access: Stairs to enter;Ramped entrance Entrance Stairs-Rails: Right Entrance Stairs-Number of Steps: 3 in garage   Home Layout: One level Home Equipment: Agricultural consultant (2 wheels);Rollator (4 wheels);Cane - single point      Prior Function Prior Level of Function : Needs assist;Driving             Mobility Comments: Pt reports he needs PRN assist for bed mobility, transfers, gait. Pt reports he's ambulatory with rollator, denies falls, still drives to church. ADLs Comments: Pt reports he needs PRN assist for bathing, dressing     Extremity/Trunk Assessment   Upper Extremity Assessment Upper Extremity Assessment: Right hand dominant;RUE deficits/detail;LUE deficits/detail RUE Deficits / Details: hx of R index finger amputation, decreased coordination with opposition, limited shoulder flexion as pt reports hx of rotator cuff injury LUE Deficits / Details: opposition WNL, decreased shoulder flexion    Lower Extremity Assessment Lower Extremity Assessment: Generalized weakness (BLE proprioception & sensation (to light touch) intact)    Cervical / Trunk Assessment Cervical / Trunk Assessment:  (rounded shoulders, forward head)  Communication   Communication Communication: Difficulty communicating thoughts/reduced clarity of speech (pt reports his speech may be slightly different than baseline)  Cognition Arousal: Alert Behavior During Therapy: Anxious Overall Cognitive Status: Within Functional Limits for tasks assessed                                 General Comments: AxOx4        General Comments General  comments (skin integrity, edema, etc.): Pt assisted with meal tray set up as pt limited by decreased BUE shoulder ROM.    Exercises     Assessment/Plan    PT Assessment Patient needs continued PT services  PT Problem List Decreased strength;Decreased coordination;Decreased range of motion;Decreased activity tolerance;Decreased balance;Decreased mobility;Decreased safety awareness;Decreased knowledge of use of DME       PT Treatment Interventions DME instruction;Balance training;Modalities;Neuromuscular re-education;Gait training;Stair training;Functional mobility training;Therapeutic activities;Therapeutic exercise;Manual techniques;Patient/family education    PT Goals (Current goals can be found in the Care Plan section)  Acute Rehab PT Goals Patient Stated Goal: get better PT Goal Formulation: With patient Time For Goal Achievement: 03/29/23 Potential to Achieve Goals: Good    Frequency Min 1X/week     Co-evaluation               AM-PAC PT "6 Clicks" Mobility  Outcome Measure Help needed turning from your back to your side while in a flat bed without using bedrails?: A Little Help needed moving from lying on your back to sitting on the side of a flat bed without using bedrails?: A Lot Help needed moving to and from a bed to a chair (including  a wheelchair)?: A Lot Help needed standing up from a chair using your arms (e.g., wheelchair or bedside chair)?: A Lot Help needed to walk in hospital room?: A Lot Help needed climbing 3-5 steps with a railing? : A Lot 6 Click Score: 13    End of Session Equipment Utilized During Treatment: Gait belt Activity Tolerance: Patient tolerated treatment well Patient left: with chair alarm set;with call bell/phone within reach;in chair Nurse Communication: Mobility status PT Visit Diagnosis: Unsteadiness on feet (R26.81);Other abnormalities of gait and mobility (R26.89);Difficulty in walking, not elsewhere classified (R26.2);Muscle  weakness (generalized) (M62.81);Hemiplegia and hemiparesis Hemiplegia - Right/Left: Left Hemiplegia - dominant/non-dominant: Non-dominant Hemiplegia - caused by: Other cerebrovascular disease    Time: 1610-9604 PT Time Calculation (min) (ACUTE ONLY): 29 min   Charges:   PT Evaluation $PT Eval Moderate Complexity: 1 Mod PT Treatments $Therapeutic Activity: 8-22 mins PT General Charges $$ ACUTE PT VISIT: 1 Visit         Aleda Grana, PT, DPT 03/15/23, 10:00 AM   Sandi Mariscal 03/15/2023, 9:59 AM

## 2023-03-15 NOTE — Progress Notes (Signed)
 Inpatient Rehab Admissions Coordinator:  ? ?Per therapy recommendations,  patient was screened for CIR candidacy by Megan Salon, MS, CCC-SLP. At this time, Pt. Appears to be a a potential candidate for CIR. I will place   order for rehab consult per protocol for full assessment. Please contact me any with questions. ? ?Megan Salon, MS, CCC-SLP ?Rehab Admissions Coordinator  ?[phone] (celll) ?[phone] (office) ? ?

## 2023-03-16 DIAGNOSIS — D649 Anemia, unspecified: Secondary | ICD-10-CM | POA: Diagnosis not present

## 2023-03-16 DIAGNOSIS — I639 Cerebral infarction, unspecified: Secondary | ICD-10-CM | POA: Diagnosis not present

## 2023-03-16 DIAGNOSIS — N184 Chronic kidney disease, stage 4 (severe): Secondary | ICD-10-CM | POA: Diagnosis not present

## 2023-03-16 DIAGNOSIS — I4819 Other persistent atrial fibrillation: Secondary | ICD-10-CM | POA: Diagnosis not present

## 2023-03-16 LAB — CBC
HCT: 29.7 % — ABNORMAL LOW (ref 39.0–52.0)
Hemoglobin: 9.9 g/dL — ABNORMAL LOW (ref 13.0–17.0)
MCH: 30.7 pg (ref 26.0–34.0)
MCHC: 33.3 g/dL (ref 30.0–36.0)
MCV: 92 fL (ref 80.0–100.0)
Platelets: 294 10*3/uL (ref 150–400)
RBC: 3.23 MIL/uL — ABNORMAL LOW (ref 4.22–5.81)
RDW: 14 % (ref 11.5–15.5)
WBC: 8.1 10*3/uL (ref 4.0–10.5)
nRBC: 0 % (ref 0.0–0.2)

## 2023-03-16 LAB — BASIC METABOLIC PANEL
Anion gap: 12 (ref 5–15)
BUN: 19 mg/dL (ref 8–23)
CO2: 23 mmol/L (ref 22–32)
Calcium: 8.7 mg/dL — ABNORMAL LOW (ref 8.9–10.3)
Chloride: 101 mmol/L (ref 98–111)
Creatinine, Ser: 1.98 mg/dL — ABNORMAL HIGH (ref 0.61–1.24)
GFR, Estimated: 31 mL/min — ABNORMAL LOW (ref >60)
Glucose, Bld: 85 mg/dL (ref 70–99)
Potassium: 4.2 mmol/L (ref 3.5–5.1)
Sodium: 136 mmol/L (ref 135–145)

## 2023-03-16 MED ORDER — AMOXICILLIN-POT CLAVULANATE 500-125 MG PO TABS
1.0000 | ORAL_TABLET | Freq: Two times a day (BID) | ORAL | Status: DC
  Administered 2023-03-16 – 2023-03-19 (×6): 1 via ORAL
  Filled 2023-03-16 (×7): qty 1

## 2023-03-16 MED ORDER — SODIUM CHLORIDE 0.9 % IV SOLN: INTRAVENOUS | Status: AC

## 2023-03-16 MED ORDER — METRONIDAZOLE 500 MG PO TABS
500.0000 mg | ORAL_TABLET | Freq: Two times a day (BID) | ORAL | Status: DC
  Filled 2023-03-16 (×2): qty 1

## 2023-03-16 MED ORDER — ISOSORBIDE MONONITRATE ER 30 MG PO TB24
15.0000 mg | ORAL_TABLET | Freq: Every day | ORAL | Status: DC
Start: 2023-03-16 — End: 2023-03-19
  Administered 2023-03-16 – 2023-03-19 (×4): 15 mg via ORAL
  Filled 2023-03-16 (×4): qty 1

## 2023-03-16 MED ORDER — METRONIDAZOLE 500 MG PO TABS
500.0000 mg | ORAL_TABLET | Freq: Two times a day (BID) | ORAL | Status: DC
Start: 2023-03-16 — End: 2023-03-19
  Administered 2023-03-16 – 2023-03-19 (×7): 500 mg via ORAL
  Filled 2023-03-16 (×8): qty 1

## 2023-03-16 NOTE — Progress Notes (Signed)
 Physical Therapy Treatment Patient Details Name: Brandon Harris MRN: [id number] DOB: 03/04/1928 Today's Date: 03/16/2023   History of Present Illness Pt is a 89 y/o M admitted on 03/13/23 after presenting with c/o LUE & LLE numbness x 48 hours. MRI showed acute R frontotemporoparietal stroke. Pt also with new onset of a-fib rate controlled. Of note, pt was seen in the ED 4 days prior with c/o abdominal pain; CT concerning for proctitis & pt was d/c home on Augmentin . PMH: CAD s/p CABG, HTN, CKD IV, stroke, mesenteric artery stenosis, prostate CA, MI, urinary incontinence and B rotator cuff repairs    PT Comments  Pt resting in bed on arrival, pleasant and agreeable to session with continued progress towards acute goals. Pt able to come to sit EOB with grossly CGA for safety with increased time and use of bed features. Pt requiring min A for transfers to stand and for gait with RW for support. Pt able to progress gait distance with continued cues for posture, LLE clearance and coordination due to decreased LLE strength and neuromuscular control. Pt agreeable to time up in chair at end of session. Current plan remains appropriate to address deficits and maximize functional independence and decrease caregiver burden. Pt continues to benefit from skilled PT services to progress toward functional mobility goals.     If plan is discharge home, recommend the following: A lot of help with walking and/or transfers;A lot of help with bathing/dressing/bathroom;Assist for transportation;Assistance with feeding;Assistance with cooking/housework;Help with stairs or ramp for entrance   Can travel by private vehicle     No  Equipment Recommendations  Other (comment) (defer to next venue)    Recommendations for Other Services       Precautions / Restrictions Precautions Precautions: Fall Restrictions Weight Bearing Restrictions Per Provider Order: No     Mobility  Bed Mobility Overal bed mobility: Needs  Assistance Bed Mobility: Supine to Sit     Supine to sit: HOB elevated, Used rails, Contact guard     General bed mobility comments: CGA for safety    Transfers Overall transfer level: Needs assistance Equipment used: Rolling walker (2 wheels) Transfers: Sit to/from Stand Sit to Stand: Min assist           General transfer comment: min A to boost to stand, x3 during session    Ambulation/Gait Ambulation/Gait assistance: Min assist Gait Distance (Feet): 65 Feet Assistive device: Rolling walker (2 wheels) Gait Pattern/deviations: Decreased step length - right, Decreased step length - left, Decreased stride length, Decreased dorsiflexion - right, Decreased dorsiflexion - left Gait velocity: decreased     General Gait Details: Pt pushing RW out in front of him. and with difficulty advancing LLE at times, cues for upright trunk   Stairs             Wheelchair Mobility     Tilt Bed    Modified Rankin (Stroke Patients Only)       Balance Overall balance assessment: Needs assistance Sitting-balance support: Feet supported, No upper extremity supported Sitting balance-Leahy Scale: Fair Sitting balance - Comments: Able to reach to feet in chair with no difficulty - supervision for edge of chair sitting   Standing balance support: Bilateral upper extremity supported, Reliant on assistive device for balance Standing balance-Leahy Scale: Fair Standing balance comment: reliant on UE support  Cognition Arousal: Alert Behavior During Therapy: WFL for tasks assessed/performed Overall Cognitive Status: Within Functional Limits for tasks assessed                                          Exercises General Exercises - Lower Extremity Hip Flexion/Marching: AROM, Right, Left, 10 reps, Seated    General Comments        Pertinent Vitals/Pain Pain Assessment Pain Assessment: No/denies pain    Home Living                           Prior Function            PT Goals (current goals can now be found in the care plan section) Acute Rehab PT Goals Patient Stated Goal: get better PT Goal Formulation: With patient Time For Goal Achievement: 03/29/23 Progress towards PT goals: Progressing toward goals    Frequency    Min 1X/week      PT Plan      Co-evaluation              AM-PAC PT 6 Clicks Mobility   Outcome Measure  Help needed turning from your back to your side while in a flat bed without using bedrails?: A Lot Help needed moving from lying on your back to sitting on the side of a flat bed without using bedrails?: A Lot Help needed moving to and from a bed to a chair (including a wheelchair)?: A Little Help needed standing up from a chair using your arms (e.g., wheelchair or bedside chair)?: A Little Help needed to walk in hospital room?: A Little Help needed climbing 3-5 steps with a railing? : A Lot 6 Click Score: 15    End of Session Equipment Utilized During Treatment: Gait belt Activity Tolerance: Patient tolerated treatment well Patient left: with chair alarm set;with call bell/phone within reach;in chair;with family/visitor present Nurse Communication: Mobility status PT Visit Diagnosis: Unsteadiness on feet (R26.81);Other abnormalities of gait and mobility (R26.89);Difficulty in walking, not elsewhere classified (R26.2);Muscle weakness (generalized) (M62.81);Hemiplegia and hemiparesis Hemiplegia - Right/Left: Left Hemiplegia - dominant/non-dominant: Non-dominant Hemiplegia - caused by: Other cerebrovascular disease     Time: 1246-1300 PT Time Calculation (min) (ACUTE ONLY): 14 min  Charges:    $Gait Training: 8-22 mins PT General Charges $$ ACUTE PT VISIT: 1 Visit                     Syliva Mee R. PTA Acute Rehabilitation Services Office: [phone]   Therisa CHRISTELLA Boor 03/16/2023, 1:06 PM

## 2023-03-16 NOTE — Plan of Care (Signed)

## 2023-03-16 NOTE — Progress Notes (Signed)
 PROGRESS NOTE    Brandon Harris  FMW:[id number] DOB: 1928/02/27 DOA: 03/13/2023 PCP: Seabron Lenis, MD    Chief Complaint  Patient presents with   Numbness    Brief Narrative:  Patient is a pleasant 89 year old gentleman history of CAD status post CABG, CKD stage IV, history of prior CVA, hypertension, mesenteric artery stenosis, history of prostate cancer initially presented to the ED 4 days prior to admission with abdominal pain, CT chest abdomen pelvis had findings concerning for chronic proctitis patient discharged on Augmentin .  Patient presented back to the ED with complaints of left upper and lower extremity numbness ongoing for 48 hours, MRI brain done concerning for acute right frontal temporal parietal stroke.  Patient also noted to be in new onset A-fib started on heparin  drip.  Patient admitted for stroke workup.  Patient seen in consultation by GI, neurology, cardiology.   Assessment & Plan:   Principal Problem:   Acute CVA (cerebrovascular accident) [facility]) Active Problems:   Essential hypertension   Hyperlipidemia   CKD (chronic kidney disease) stage 4, GFR 15-29 ml/min (HCC)   Gastroesophageal reflux disease   Chronic anemia   UTI (urinary tract infection)   Proctitis   Atrial fibrillation (HCC)  #1 acute right frontoparietal CVA -Likely cardioembolic in nature secondary to new onset atrial fibrillation. -Patient with clinical improvement of left upper and left lower extremity numbness. -Head CT done with no acute intracranial findings, chronic microvascular ischemic changes and cerebral volume loss. -MRI brain done with small acute right frontoparietal infarcts.  Moderate to severe chronic small vessel ischemic disease and numerous chronic infarcts.  Suspected occlusion of distal left vertebral artery. -MRA head and neck done with normal intracranial MRA.  Severely technically degraded MRA of the neck essentially nondiagnostic. -2D echo with EF of 65 to 70%,NWMA,  mild LVH, mildly reduced right ventricular systolic function, mildly dilated left atrial size, trivial MVR, no atrial level shunt detected by color-flow Doppler. -Preliminary carotid ultrasound done with bilateral 40 to 59% ICA stenosis which may be underestimated secondary to bulky plaque and vessel tortuosity.  Right vertebral artery demonstrated antegrade flow.  Left vertebral artery demonstrates an occlusion. -Fasting lipid panel with LDL of 35. -Hemoglobin A1c 5.5. -Patient was on IV heparin  for anticoagulation. -Patient active with no falls in discussion with daughter. -Patient transitioned to Eliquis . -Continue statin. -Aspirin  discontinued. -PT/OT/SLP. -Therapy recommending CIR placement. -Neurology was following, but have signed off. -Outpatient follow-up with neurology.  2.  New onset A-fib -Patient noted to be on new onset A-fib on admission and had presented with acute CVA. -CHA2DS2VASC score is 6. -TSH within normal limits. -2D echo with EF of 65 to 70%, mildly reduced RV function, mildly dilated left atrial size,NWMA. -Currently rate controlled on carvedilol  12.5 mg twice daily. -Was on IV heparin  on admission and has been transitioned to Eliquis  2.5 mg twice daily due to age and renal function per cardiology recommendations. -Per cardiology once stable and after around 3 to 4 weeks may consider DCCV at that time if patient still in A-fib. -Will need outpatient follow-up with cardiology. -Appreciate cardiology input and recommendations.  3.  Hypertension -Permissive hypertension. -Continue current regimen of Coreg . -Resume Imdur  at a decreased dose of 15 mg daily. -Continue to hold hydralazine  and Norvasc .  4.  CAD status post CABG (2001) -Currently stable and asymptomatic. -Continue Eliquis , statin, beta-blocker. -Will resume Imdur  at a decreased dose of 15 mg daily.   -Continue to hold hydralazine  and Norvasc .   5.  History of prostate cancer -Outpatient  follow-up.  6.  Abdominal pain, dysphagia//nausea and vomiting/history of mesenteric artery stenosis/?  Proctitis -Patient seen in consultation by GI who are recommending empiric PPI daily, sucralfate  1 g p.o. 4 times daily with no plans for endoscopic intervention at this time and outpatient follow-up with GI if recurrence of symptoms.  7.  Recent diagnosis of E. coli UTI -Urine cultures from 03/09/2023 resistant to ciprofloxacin  otherwise pansensitive. -Will transition from IV Rocephin  to Augmentin .   8.  CKD stage IV -Stable.  9.  Severe protein calorie malnutrition -Continue nutritional supplementation.   DVT prophylaxis: Heparin  Code Status: Full Family Communication: Updated patient, daughter, son and daughter-in-law at bedside. Disposition: CIR.  Status is: Inpatient Remains inpatient appropriate because: Severity of illness   Consultants:  GI: Dr. Kriss 03/14/2023 Neurology: Dr.Khaliqdina 03/14/2023 Cardiology: Dr. Mona 03/15/2023  Procedures:  CT head 03/13/2023 MRI brain 03/13/2023 MRA head and neck 03/14/2023 Carotid ultrasound pending 03/15/2023 2D echo 03/14/2023   Antimicrobials:  Anti-infectives (From admission, onward)    Start     Dose/Rate Route Frequency Ordered Stop   03/16/23 1245  metroNIDAZOLE  (FLAGYL ) tablet 500 mg        500 mg Oral Every 12 hours 03/16/23 1150     03/14/23 0115  metroNIDAZOLE  (FLAGYL ) IVPB 500 mg  Status:  Discontinued        500 mg 100 mL/hr over 60 Minutes Intravenous 2 times daily 03/14/23 0055 03/15/23 1553   03/14/23 0100  cefTRIAXone  (ROCEPHIN ) 2 g in sodium chloride  0.9 % 100 mL IVPB        2 g 200 mL/hr over 30 Minutes Intravenous Every 24 hours 03/14/23 0055 03/21/23 0059         Subjective: Patient sitting up in bed.  Daughter at bedside.  Patient denies any chest pain or shortness of breath.  No abdominal pain.  Feels left-sided weakness has improved.  Speech improved.    Objective: Vitals:   03/16/23 0419 03/16/23  0716 03/16/23 1105 03/16/23 1210  BP: (!) 164/82 (!) 163/72 114/60   Pulse: 86 78 81   Resp: 18 19 18    Temp: 97.7 F (36.5 C) 97.8 F (36.6 C) 97.8 F (36.6 C)   TempSrc: Oral Oral Oral   SpO2:  98% 99%   Weight:    92.1 kg  Height:    5' 11 (1.803 m)    Intake/Output Summary (Last 24 hours) at 03/16/2023 1303 Last data filed at 03/16/2023 0845 Gross per 24 hour  Intake 69.08 ml  Output 1300 ml  Net -1230.92 ml   Filed Weights   03/16/23 1210  Weight: 92.1 kg    Examination:  General exam: NAD. Respiratory system: CTAB.  No wheezes, no crackles, no rhonchi.  Fair air movement.  Speaking in full sentences.  Dry mucous membranes. Cardiovascular system: Irregularly irregular.  No JVD, no murmurs rubs or gallops.  No pitting lower extremity edema.  Gastrointestinal system: Abdomen is soft, nontender, nondistended, positive bowel sounds.  No rebound.  No guarding.  Central nervous system: Alert and oriented. No focal neurological deficits. Extremities: 4/5 left upper extremity and left lower extremity strength.  5/5 right upper extremity strength and right lower extremity strength.  Skin: No rashes, lesions or ulcers Psychiatry: Judgement and insight appear fair to normal. Mood & affect appropriate.     Data Reviewed: I have personally reviewed following labs and imaging studies  CBC: Recent Labs  Lab 03/13/23 1207 03/14/23 0323 03/15/23 0557  03/16/23 0531  WBC 7.6 8.3 8.9 8.1  NEUTROABS 4.3  --   --   --   HGB 9.7* 9.9* 10.1* 9.9*  HCT 30.2* 30.3* 30.9* 29.7*  MCV 94.7 93.5 92.2 92.0  PLT 294 296 307 294    Basic Metabolic Panel: Recent Labs  Lab 03/13/23 1207 03/14/23 0323 03/15/23 0944 03/16/23 0531  NA 133*  --  135 136  K 4.5  --  4.0 4.2  CL 100  --  102 101  CO2 22  --  21* 23  GLUCOSE 117*  --  114* 85  BUN 20  --  16 19  CREATININE 2.26* 2.08* 2.01* 1.98*  CALCIUM  8.7*  --  9.2 8.7*  MG  --   --  2.0  --   PHOS  --   --  3.6  --      GFR: Estimated Creatinine Clearance: 27.6 mL/min (A) (by C-G formula based on SCr of 1.98 mg/dL (H)).  Liver Function Tests: Recent Labs  Lab 03/15/23 0944  ALBUMIN 3.5    CBG: Recent Labs  Lab 03/13/23 1115 03/15/23 0911  GLUCAP 102* 99     Recent Results (from the past 240 hours)  Resp panel by RT-PCR (RSV, Flu A&B, Covid) Anterior Nasal Swab     Status: None   Collection Time: 03/09/23  7:16 AM   Specimen: Anterior Nasal Swab  Result Value Ref Range Status   SARS Coronavirus 2 by RT PCR NEGATIVE NEGATIVE Final    Comment: (NOTE) SARS-CoV-2 target nucleic acids are NOT DETECTED.  The SARS-CoV-2 RNA is generally detectable in upper respiratory specimens during the acute phase of infection. The lowest concentration of SARS-CoV-2 viral copies this assay can detect is 138 copies/mL. A negative result does not preclude SARS-Cov-2 infection and should not be used as the sole basis for treatment or other patient management decisions. A negative result may occur with  improper specimen collection/handling, submission of specimen other than nasopharyngeal swab, presence of viral mutation(s) within the areas targeted by this assay, and inadequate number of viral copies(<138 copies/mL). A negative result must be combined with clinical observations, patient history, and epidemiological information. The expected result is Negative.  Fact Sheet for Patients:  bloggercourse.com  Fact Sheet for Healthcare Providers:  seriousbroker.it  This test is no t yet approved or cleared by the [place]  FDA and  has been authorized for detection and/or diagnosis of SARS-CoV-2 by FDA under an Emergency Use Authorization (EUA). This EUA will remain  in effect (meaning this test can be used) for the duration of the COVID-19 declaration under Section 564(b)(1) of the Act, 21 U.S.C.section 360bbb-3(b)(1), unless the authorization is  terminated  or revoked sooner.       Influenza A by PCR NEGATIVE NEGATIVE Final   Influenza B by PCR NEGATIVE NEGATIVE Final    Comment: (NOTE) The Xpert Xpress SARS-CoV-2/FLU/RSV plus assay is intended as an aid in the diagnosis of influenza from Nasopharyngeal swab specimens and should not be used as a sole basis for treatment. Nasal washings and aspirates are unacceptable for Xpert Xpress SARS-CoV-2/FLU/RSV testing.  Fact Sheet for Patients: bloggercourse.com  Fact Sheet for Healthcare Providers: seriousbroker.it  This test is not yet approved or cleared by the [place]  FDA and has been authorized for detection and/or diagnosis of SARS-CoV-2 by FDA under an Emergency Use Authorization (EUA). This EUA will remain in effect (meaning this test can be used) for the duration of the COVID-19 declaration  under Section 564(b)(1) of the Act, 21 U.S.C. section 360bbb-3(b)(1), unless the authorization is terminated or revoked.     Resp Syncytial Virus by PCR NEGATIVE NEGATIVE Final    Comment: (NOTE) Fact Sheet for Patients: bloggercourse.com  Fact Sheet for Healthcare Providers: seriousbroker.it  This test is not yet approved or cleared by the [place]  FDA and has been authorized for detection and/or diagnosis of SARS-CoV-2 by FDA under an Emergency Use Authorization (EUA). This EUA will remain in effect (meaning this test can be used) for the duration of the COVID-19 declaration under Section 564(b)(1) of the Act, 21 U.S.C. section 360bbb-3(b)(1), unless the authorization is terminated or revoked.  Performed at [facility], [address] [place], [place] [address]   Urine Culture     Status: Abnormal   Collection Time: 03/09/23 11:35 AM   Specimen: Urine, Clean Catch  Result Value Ref Range Status   Specimen Description   Final    URINE,  CLEAN CATCH Performed at [facility], [address], [place], [place] [address]    Special Requests   Final    NONE Performed at [facility], [address], [place], [place] [address]    Culture >=100,000 COLONIES/mL ESCHERICHIA COLI (A)  Final   Report Status 03/11/2023 FINAL  Final   Organism ID, Bacteria ESCHERICHIA COLI (A)  Final      Susceptibility   Escherichia coli - MIC*    AMPICILLIN <=2 SENSITIVE Sensitive     CEFAZOLIN  <=4 SENSITIVE Sensitive     CEFEPIME <=0.12 SENSITIVE Sensitive     CEFTRIAXONE  <=0.25 SENSITIVE Sensitive     CIPROFLOXACIN  >=4 RESISTANT Resistant     GENTAMICIN <=1 SENSITIVE Sensitive     IMIPENEM <=0.25 SENSITIVE Sensitive     NITROFURANTOIN <=16 SENSITIVE Sensitive     TRIMETH /SULFA  <=20 SENSITIVE Sensitive     AMPICILLIN/SULBACTAM <=2 SENSITIVE Sensitive     PIP/TAZO <=4 SENSITIVE Sensitive ug/mL    * >=100,000 COLONIES/mL ESCHERICHIA COLI         Radiology Studies: VAS US  CAROTID Result Date: 03/15/2023 Carotid Arterial Duplex Study Patient Name:  KUSH FARABEE [facility]  Date of Exam:   03/15/2023 Medical Rec #: [id number]      Accession #:    [id number] Date of Birth: 07/06/1928     Patient Gender: M Patient Age:   61 years Exam Location:   Procedure:      VAS US  CAROTID Referring Phys: ELLOUISE MARI --------------------------------------------------------------------------------  Indications:       CVA, Carotid artery disease and Numbness. Risk Factors:      Hypertension, hyperlipidemia, no history of smoking, prior                    MI, coronary artery disease, prior CVA. Other Factors:     CABG X 5, new atrial fibrillation. Limitations        Today's exam was limited due to vessel tortuosity, heavy                    calcification resulting in significant shadowing, and                    respiratory variation. Comparison Study:  Prior carotid duplex done 03/19/22 at [facility] indicating                     1-39% ICA stenosis, bilaterally. Patient has a long history  of technically difficult carotid studies limited by calcific                    plaque, acoustic shadowing, and vessel tortuoisity with                    varying results from study to study. Performing Technologist: Alberta Lis RVS  Examination Guidelines: A complete evaluation includes B-mode imaging, spectral Doppler, color Doppler, and power Doppler as needed of all accessible portions of each vessel. Bilateral testing is considered an integral part of a complete examination. Limited examinations for reoccurring indications may be performed as noted.  Right Carotid Findings: +----------+--------+--------+--------+----------------------+---------+           PSV cm/sEDV cm/sStenosisPlaque Description    Comments  +----------+--------+--------+--------+----------------------+---------+ CCA Prox  40      13              irregular and calcific          +----------+--------+--------+--------+----------------------+---------+ CCA Distal66      22              irregular and calcific          +----------+--------+--------+--------+----------------------+---------+ ICA Prox  182     37      40-59%  calcific              Shadowing +----------+--------+--------+--------+----------------------+---------+ ICA Mid   54      15                                    tortuous  +----------+--------+--------+--------+----------------------+---------+ ICA Distal47      23                                    tortuous  +----------+--------+--------+--------+----------------------+---------+ ECA       66      9                                               +----------+--------+--------+--------+----------------------+---------+ +----------+--------+-------+--------+-------------------+           PSV cm/sEDV cmsDescribeArm Pressure (mmHG) +----------+--------+-------+--------+-------------------+  Dlarojcpjw50                                         +----------+--------+-------+--------+-------------------+ +---------+--------+--+--------+--+---------+ VertebralPSV cm/s55EDV cm/s15Antegrade +---------+--------+--+--------+--+---------+  Left Carotid Findings: +----------+--------+--------+--------+------------------+--------+           PSV cm/sEDV cm/sStenosisPlaque DescriptionComments +----------+--------+--------+--------+------------------+--------+ CCA Prox  60      9                                          +----------+--------+--------+--------+------------------+--------+ CCA Distal61      3                                          +----------+--------+--------+--------+------------------+--------+ ICA Prox  163     40      40-59%  tortuous +----------+--------+--------+--------+------------------+--------+ ICA Mid   105     23                                tortuous +----------+--------+--------+--------+------------------+--------+ ICA Distal                                          tortuous +----------+--------+--------+--------+------------------+--------+ ECA       130     13                                         +----------+--------+--------+--------+------------------+--------+ +----------+--------+--------+--------+-------------------+           PSV cm/sEDV cm/sDescribeArm Pressure (mmHG) +----------+--------+--------+--------+-------------------+ Dlarojcpjw32                                          +----------+--------+--------+--------+-------------------+ +---------+--------+--------+--------+ VertebralPSV cm/sEDV cm/soccluded +---------+--------+--------+--------+   Summary: Right Carotid: Velocities in the right ICA are consistent with a 40-59%                stenosis. Stenosis may be underestimated secondary to bulky                plaque and vessel tortuosity. Left Carotid: Velocities in  the left ICA are consistent with a 40-59% stenosis.               Stenosis may be underestimated secondary to bulky plaque and               vessel tortuosity. Vertebrals:  Right vertebral artery demonstrates antegrade flow. Left vertebral              artery demonstrates an occlusion. Subclavians: Normal flow hemodynamics were seen in bilateral subclavian              arteries. *See table(s) above for measurements and observations.  Electronically signed by Lonni Gaskins MD on 03/15/2023 at 3:19:04 PM.    Final    MR ANGIO NECK WO CONTRAST Result Date: 03/14/2023 CLINICAL DATA:  Acute neurologic deficit EXAM: MRA NECK WITHOUT CONTRAST TECHNIQUE: Angiographic images of the neck were acquired using MRA technique without intravenous contrast. Carotid stenosis measurements (when applicable) are obtained utilizing NASCET criteria, using the distal internal carotid diameter as the denominator. COMPARISON:  None Available. FINDINGS: Severely technically degraded MRA. There is no flow related enhancement in any vessel, which is likely artifactual. There is a 3 vessel aortic arch branching pattern. The study is essentially nondiagnostic. IMPRESSION: Severely technically degraded MRA, essentially nondiagnostic. Electronically Signed   By: Franky Stanford M.D.   On: 03/14/2023 21:41   MR ANGIO HEAD WO CONTRAST Result Date: 03/14/2023 CLINICAL DATA:  Acute neurologic deficit EXAM: MRA HEAD WITHOUT CONTRAST TECHNIQUE: Angiographic images of the Circle of Willis were acquired using MRA technique without intravenous contrast. COMPARISON:  None Available. FINDINGS: POSTERIOR CIRCULATION: Vertebral arteries are normal. No proximal occlusion of the anterior or inferior cerebellar arteries. Basilar artery is diminutive distally. Superior cerebellar arteries are normal. Posterior cerebral arteries are normal. ANTERIOR CIRCULATION: Intracranial internal carotid arteries are normal. Anterior cerebral arteries are normal. Middle  cerebral arteries are normal. Anatomic Variants: Fetal predominant  origins of the posterior cerebral arteries. IMPRESSION: Normal intracranial MRA. Electronically Signed   By: Franky Stanford M.D.   On: 03/14/2023 21:23        Scheduled Meds:  apixaban   2.5 mg Oral BID   atorvastatin   80 mg Oral QPM   carvedilol   12.5 mg Oral BID WC   isosorbide  mononitrate  15 mg Oral Daily   metroNIDAZOLE   500 mg Oral Q12H   pantoprazole   40 mg Oral Daily   sucralfate   1 g Oral TID WC & HS   Continuous Infusions:  sodium chloride  75 mL/hr at 03/16/23 1220   cefTRIAXone  (ROCEPHIN )  IV 2 g (03/16/23 0056)     LOS: 2 days    Time spent: 40 minutes    Toribio Hummer, MD [facility]   To contact the attending provider between 7A-7P or the covering provider during after hours 7P-7A, please log into the web site [email or web address] and access using universal [place] password for that web site. If you do not have the password, please call the hospital operator.  03/16/2023, 1:03 PM

## 2023-03-16 NOTE — PMR Pre-admission (Signed)
 PMR Admission Coordinator Pre-Admission Assessment  Patient: Brandon Harris is an 89 y.o., male MRN: [id number] DOB: [birth year] Height: 5' 11 (180.3 cm) Weight: 92.1 kg              Insurance Information HMO: yes    PPO:      PCP:      IPA:      80/20:      OTHER:  PRIMARY: [employer] Medicare      Policy#: [id number]      Subscriber: pt CM Name: Curlee      Phone#: [phone]      Fax#: [phone] Pre-Cert#: [id number] auth for CIR from [place] with Home and [facility] with updates due to fax listed above on 2/11      Employer:  Benefits:  Phone #: [phone]     Name:  Eff. Date: 02/10/23     Deduct: $0      Out of Pocket [place]yl: $3600 (met $20 met)      Life [place]: n/a  CIR: $300/day for days 1-5      SNF: 20 full days Outpatient:      Co-Pay: $20/visit Home Health: 100%      Co-Pay:  DME: 80%     Co-Pay: 20% Providers: in-network SECONDARY:       Policy#:       Phone#:   Artist:       Phone#:   The Data Processing Manager" for patients in Inpatient Rehabilitation Facilities with attached "Privacy Act Statement-Health Care Records" was provided and verbally reviewed with: Patient and Family  Emergency Contact Information Contact Information     Name Relation Home Work Mobile   Davis,Janice Daughter [phone]  [phone]      Other Contacts   None on File    Current Medical History  Patient Admitting Diagnosis: CVA  History of Present Illness: Pt is a 89 y/o male with PMH of CAD s/p CABG, HTN, CKD IV, stroke, mesenteric artery stenosis, prostate cancer, MI, and B rotator cuff repairs who was [facility] to [facility] on 03/13/23 with c/o L sided numbness x48 hours.  In ED EKG concerning for new onset afib, labs creatinine 2.2, hemoglobin 9.7, UA done prior to admit positive for e.coli.  MRI w/o showed small acute right frontoparietal strokes.  Modified rankin 2, NIHSS 0.  Outside window for TNK, not a candidate for mechanical intervention due to low LVO  suspicion.  TEE showed EF 65-70%.  Neurology recommended cardiology consult for risk assessment of anticoagulation in the setting of new afib.  Cardiology recommendations to transition to IV heparin  to reduced dose eliquis  BID and stop aspirin .  Therapy evaluations completed and pt was recommended for CIR.   Complete NIHSS TOTAL: 3 Glasgow Coma Scale Score: 15  Patient's medical record from [facility] has been reviewed by the rehabilitation admission coordinator and physician.  Past Medical History  Past Medical History:  Diagnosis Date   Arthritis    Carotid arterial disease (HCC)    Chronic kidney disease (CKD), stage IV (severe) (HCC)    Diverticulosis    colonic diverticulosis   Gallstones    GERD (gastroesophageal reflux disease)    Hypertension    Myocardial infarction [facility])    Prostate cancer (HCC)    Status post coronary artery bypass grafting     intraoperative cholangiogram   Stroke [facility])    patient reports no deficits    Urinary incontinence    bladder leakage -  patient wears a pad    Has the patient had major surgery during 100 days prior to admission? No  Family History  family history includes Heart failure in his mother.   Current Medications   Current Facility-Administered Medications:    acetaminophen  (TYLENOL ) tablet 650 mg, 650 mg, Oral, Q4H PRN **OR** acetaminophen  (TYLENOL ) 160 MG/5ML solution 650 mg, 650 mg, Per Tube, Q4H PRN **OR** acetaminophen  (TYLENOL ) suppository 650 mg, 650 mg, Rectal, Q4H PRN, Franky Redia SAILOR, MD   ALPRAZolam  (XANAX ) tablet 0.25 mg, 0.25 mg, Oral, QHS PRN, Sebastian Toribio GAILS, MD, 0.25 mg at 03/17/23 2113   amoxicillin -clavulanate (AUGMENTIN ) 500-125 MG per tablet 1 tablet, 1 tablet, Oral, Q12H, Sebastian Toribio GAILS, MD, 1 tablet at 03/18/23 2150   apixaban  (ELIQUIS ) tablet 2.5 mg, 2.5 mg, Oral, BID, Haley, Sheng L, PA-C, 2.5 mg at 03/18/23 2150   atorvastatin  (LIPITOR ) tablet 80 mg, 80 mg, Oral, QPM, Franky Redia SAILOR,  MD, 80 mg at 03/18/23 1735   carvedilol  (COREG ) tablet 12.5 mg, 12.5 mg, Oral, BID WC, Kakrakandy, Arshad N, MD, 12.5 mg at 03/19/23 9155   ferrous sulfate  tablet 325 mg, 325 mg, Oral, Daily, Sebastian Toribio GAILS, MD, 325 mg at 03/18/23 9183   hydrALAZINE  (APRESOLINE ) injection 10 mg, 10 mg, Intravenous, Q6H PRN, Franky Redia SAILOR, MD   isosorbide  mononitrate (IMDUR ) 24 hr tablet 15 mg, 15 mg, Oral, Daily, Sebastian Toribio GAILS, MD, 15 mg at 03/18/23 0815   metroNIDAZOLE  (FLAGYL ) tablet 500 mg, 500 mg, Oral, Q12H, Reome, Earle J, RPH, 500 mg at 03/18/23 2150   mirtazapine  (REMERON ) tablet 15 mg, 15 mg, Oral, QHS, Sebastian Toribio GAILS, MD, 15 mg at 03/18/23 2150   pantoprazole  (PROTONIX ) EC tablet 40 mg, 40 mg, Oral, Daily, Vreeland, Claire H, DO, 40 mg at 03/18/23 0816   sucralfate  (CARAFATE ) 1 GM/10ML suspension 1 g, 1 g, Oral, TID WC & HS, Kriss Stagger H, DO, 1 g at 03/19/23 0844  Patients Current Diet:  Diet Order             Diet Heart Fluid consistency: Thin  Diet effective now                   Precautions / Restrictions Precautions Precautions: Fall Restrictions Weight Bearing Restrictions Per Provider Order: No   Has the patient had 2 or more falls or a fall with injury in the past year?No  Prior Activity Level Community (5-7x/wk): independent, driving, 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 Pt reports he's ambulatory with rollator, denies falls, still drives to church. ADLs Comments: Patient needs occsional assist with donning/ doffing jackets or long sleeve shirts  Self Care: Did the patient need help bathing, dressing, using the toilet or eating?  Independent  Indoor Mobility: Did the  patient need assistance with walking from room to room (with or without device)? Independent  Stairs: Did the patient need assistance with internal or external stairs (with or without device)? Independent  Functional Cognition: Did the patient need help planning regular tasks such as shopping or remembering to take medications? Independent  Patient Information Are you of Hispanic, Latino/a,or Spanish origin?: A. No, not of Hispanic, Latino/a, or Spanish origin What is your race?: A. White Do you need or  want an interpreter to communicate with a doctor or health care staff?: 0. No  Patient's Response To:  Health Literacy and Transportation Is the patient able to respond to health literacy and transportation needs?: Yes Health Literacy - How often do you need to have someone help you when you read instructions, pamphlets, or other written material from your doctor or pharmacy?: Never In the past 12 months, has lack of transportation kept you from medical appointments or from getting medications?: No In the past 12 months, has lack of transportation kept you from meetings, work, or from getting things needed for daily living?: No  Home Assistive Devices / Equipment Home Equipment: Agricultural Consultant (2 wheels), Rollator (4 wheels), [employer] - single point  Prior Device Use: Indicate devices/aids used by the patient prior to current illness, exacerbation or injury?  Cane occasionally  Current Functional Level Cognition  Arousal/Alertness: Awake/alert Overall Cognitive Status: Within Functional Limits for tasks assessed Orientation Level: Oriented X4 General Comments: AxOx4 Attention: Selective Selective Attention: Appears intact Memory: Impaired Memory Impairment: Decreased recall of new information Executive Function: Self Correcting Self Correcting: Impaired    Extremity Assessment (includes Sensation/Coordination)  Upper Extremity Assessment: LUE deficits/detail, RUE deficits/detail,  Right hand dominant RUE Deficits / Details: hx of R index finger amputation, Patient with no active should or adduction due to rotator cuff injury LUE Deficits / Details: no active shoulder flexion, grip strength 4/5  Lower Extremity Assessment: Defer to PT evaluation    ADLs  Overall ADL's : Needs assistance/impaired Eating/Feeding: Set up Eating/Feeding Details (indicate cue type and reason): Positioning will need to be upright due to lack of shoulder ROM Grooming: Minimal assistance Upper Body Bathing: Minimal assistance Lower Body Bathing: Moderate assistance Upper Body Dressing : Moderate assistance Lower Body Dressing: Moderate assistance Toilet Transfer: Moderate assistance Toileting- Clothing Manipulation and Hygiene: Moderate assistance Functional mobility during ADLs: Moderate assistance General ADL Comments: Patient needing cueing for safe use of walker, noted ataxic movements in LLE with stepping forward or backward with a tendency to flex at the hips with backward stepping. Also noted hyperextension of the LLE.  Patient SOB with standing <5 minutes.  Patient was able to release BUE in standing and / or use BUE for support    Mobility  Overal bed mobility: Needs Assistance Bed Mobility: Supine to Sit, Sit to Supine Supine to sit: HOB elevated, Used rails, Min assist Sit to supine: Contact guard assist General bed mobility comments: min A to initiate LLE to and off EOB and to elevate trunk, able to return to supine without physical assist    Transfers  Overall transfer level: Needs assistance Equipment used: Rolling walker (2 wheels) Transfers: Sit to/from Stand Sit to Stand: Mod assist, Demitris assist General transfer comment: Donye A on initial stand, mod A to stand x2 at end of session, poor initiation during all attempts and assist needed to boost    Ambulation / Gait / Stairs / Wheelchair Mobility  Ambulation/Gait Ambulation/Gait assistance: Editor, Commissioning  (Feet): 78 Feet Assistive device: Rolling walker (2 wheels) Gait Pattern/deviations: Decreased step length - right, Decreased step length - left, Decreased stride length, Decreased dorsiflexion - right, Decreased dorsiflexion - left General Gait Details: Pt pushing RW out in front of him. and with difficulty advancing LLE at times, cues for upright trunk and bigger steps on L with pt able to briefly correct but unable to maintain Gait velocity: decreased    Posture / Balance Dynamic Sitting Balance Sitting balance -  Comments: Able to reach to feet in chair with no difficulty - supervision for edge of chair sitting Balance Overall balance assessment: Needs assistance Sitting-balance support: Feet supported, No upper extremity supported Sitting balance-Leahy Scale: Fair Sitting balance - Comments: Able to reach to feet in chair with no difficulty - supervision for edge of chair sitting Standing balance support: Bilateral upper extremity supported, Reliant on assistive device for balance Standing balance-Leahy Scale: Fair Standing balance comment: reliant on UE support    Special needs/care consideration None     Previous Home Environment (from acute therapy documentation) Living Arrangements: Children Available Help at Discharge: Family, Available 24 hours/day (Simultaneous filing. User may not have seen previous data.) Type of Home: House (Simultaneous filing. User may not have seen previous data.) Home Layout: One level Home Access: Stairs to enter, Ramped entrance Entrance Stairs-Rails: Right Entrance Stairs-Number of Steps: 3 in garage Bathroom Shower/Tub: Tub/shower unit Home Care Services: No Additional Comments: Patient reports he prefers to take tub baths and has grab bars he uses to get in and out of the tub.  Discharge Living Setting Plans for Discharge Living Setting: Patient's home, Lives with (comment) (daughter) Type of Home at Discharge: House Discharge Home Layout:  One level Discharge Home Access: Stairs to enter, Ramped entrance Entrance Stairs-Rails: Right Entrance Stairs-Number of Steps: 3 in garage Discharge Bathroom Shower/Tub: Tub/shower unit Discharge Bathroom Toilet: Standard Discharge Bathroom Accessibility: Yes How Accessible: Accessible via walker Does the patient have any problems obtaining your medications?: No  Social/Family/Support Systems Anticipated Caregiver: pt's daughter, Romero Anticipated Industrial/product Designer Information: [phone] Ability/Limitations of Caregiver: none stated Caregiver Availability: 24/7 Discharge Plan Discussed with Primary Caregiver: Yes Is Caregiver In Agreement with Plan?: Yes Does Caregiver/Family have Issues with Lodging/Transportation while Pt is in Rehab?: No   Goals Patient/Family Goal for Rehab: PT/OT/SLP supervision to mod I Expected length of stay: 11-15 days Additional Information: Discharge plan: return to pt's previous living arrangements with daughter available 24/7 Pt/Family Agrees to Admission and willing to participate: Yes Program Orientation Provided & Reviewed with Pt/Caregiver Including Roles  & Responsibilities: Yes   Decrease burden of Care through IP rehab admission:  n/a   Possible need for SNF placement upon discharge: Not anticipated.  Plan to return home with daughter providing 24/7 supervision.    Patient Condition: This patient's condition remains as documented in the consult dated 03/16/23, in which the Rehabilitation Physician determined and documented that the patient's condition is appropriate for intensive rehabilitative care in an inpatient rehabilitation facility. Will admit to inpatient rehab 03/19/23.  Preadmission Screen Completed By:  Caitlin E Warren, PT, DPT  03/19/2023 10:00 AM ______________________________________________________________________   Discussed status with Dr. Babs on 03/19/23  at 10:00 AM and received approval for admission  today.  Admission Coordinator:  Caitlin E Warren, PT, DPT   time 10:00 AM/ date 03/19/23

## 2023-03-16 NOTE — Progress Notes (Signed)
Inpatient Rehab Admissions Coordinator:   Await determination from [facility] Medicare regarding CIR prior auth request.  Will follow.   Estill Dooms, PT, DPT Admissions Coordinator [phone] 03/16/23  4:20 PM

## 2023-03-16 NOTE — Consult Note (Addendum)
 Physical Medicine and Rehabilitation Consult Reason for Consult: Functional deficits after stroke Referring Physician: Jerri   HPI: Brandon Harris is a 89 y.o. male with a history of CAD, prior MI with CABG, CKD 4 and remote stroke who was admitted on 03/14/2023 with left-sided weakness and numbness.  He was found to be in new atrial fibrillation with rapid ventricular response.  He was placed on IV heparin  and was not given TNK because he was outside the therapeutic window.  MRI ultimately revealed multiple, right MCA cortical infarcts felt to be embolic due to his new A-fib.  Patient was transition to Eliquis .  He was up with therapy yesterday and was min to mod assist with sit to stand transfers and walked 14 feet using a rolling walker at a moderate assist level.  Patient lives at home with daughter in a 1 level dwelling and 3 steps to enter in the garage but also with ramp.  Patient ambulated independently and was independent with ADL's.  He still drove to church and rides tractor around their property.   Review of Systems  Constitutional:  Negative for fever.  HENT:  Negative for hearing loss.   Eyes:  Negative for blurred vision.  Respiratory:  Negative for cough.   Cardiovascular:  Negative for chest pain.  Gastrointestinal:  Positive for nausea and vomiting.  Genitourinary: Negative.   Musculoskeletal:  Positive for myalgias.  Skin:  Negative for rash.  Neurological:  Positive for sensory change and focal weakness.  Psychiatric/Behavioral:  Negative for depression.    Past Medical History:  Diagnosis Date   Arthritis    Carotid arterial disease (HCC)    Chronic kidney disease (CKD), stage IV (severe) (HCC)    Diverticulosis    colonic diverticulosis   Gallstones    GERD (gastroesophageal reflux disease)    Hypertension    Myocardial infarction [facility])    Prostate cancer (HCC)    Status post coronary artery bypass grafting     intraoperative cholangiogram   Stroke [facility])     patient reports no deficits    Urinary incontinence    bladder leakage -  patient wears a pad   Past Surgical History:  Procedure Laterality Date   BACK SURGERY     x 4, has titanium in back per patient    CHOLECYSTECTOMY, LAPAROSCOPIC     CORONARY ARTERY BYPASS GRAFT  2001   CRYOTHERAPY     Prostate Cancer   LEFT HEART CATH AND CORS/GRAFTS ANGIOGRAPHY N/A 10/27/2017   Procedure: LEFT HEART CATH AND CORS/GRAFTS ANGIOGRAPHY;  Surgeon: Claudene Victory ORN, MD;  Location: [facility] INVASIVE CV LAB;  Service: Cardiovascular;  Laterality: N/A;   prostate cancer     ROTATOR CUFF REPAIR     LEFT   TOTAL KNEE ARTHROPLASTY Right 12/18/2014   Procedure: TOTAL RIGHT KNEE ARTHROPLASTY;  Surgeon: Donnice Car, MD;  Location: [facility] ORS;  Service: Orthopedics;  Laterality: Right;   Family History  Problem Relation Age of Onset   Heart failure Mother    Social History:  reports that he has never smoked. He has never used smokeless tobacco. He reports that he does not drink alcohol and does not use drugs. Allergies:  Allergies  Allergen Reactions   Amlodipine  Besylate     Other reaction(s): 10 mg leads to swelling; tolerates 5 mg   Mirabegron      Other reaction(s): Unknown   Vibegron     Other reaction(s): mental status changes  Medications Prior to Admission  Medication Sig Dispense Refill   ALPRAZolam  (XANAX ) 0.25 MG tablet Take 0.25 mg by mouth at bedtime as needed for anxiety or sleep. TAKE ONE TABLET AT NIGHT     amitriptyline (ELAVIL) 10 MG tablet Take 20 mg by mouth at bedtime.  0   amLODipine  (NORVASC ) 10 MG tablet Take 1 tablet (10 mg total) by mouth every morning. 90 tablet 0   amoxicillin -clavulanate (AUGMENTIN ) 875-125 MG tablet Take 1 tablet by mouth every 12 (twelve) hours. 14 tablet 0   atorvastatin  (LIPITOR ) 80 MG tablet Take 80 mg by mouth every evening.      CALCIUM  PO Take 1 tablet by mouth daily.     carvedilol  (COREG ) 12.5 MG tablet Take one tablet by mouth every morning and take  one tablet by mouth everyday at bedtime 180 tablet 0   Ferrous Sulfate  (IRON PO) Take 1 tablet by mouth daily.     furosemide  (LASIX ) 20 MG tablet TAKE 1 TABLET BY MOUTH WEEKLY ON MONDAY, WEDNESDAY & FRIDAY     hydrALAZINE  (APRESOLINE ) 25 MG tablet Take 1 tablet (25 mg total) by mouth 3 (three) times daily. To be taken with (50 mg) tablet three times daily. 270 tablet 0   hydrALAZINE  (APRESOLINE ) 50 MG tablet Take one tablet by mouth every morning and take one tablet by mouth at noon and take one tablet by mouth everyday at bedtime. 270 tablet 0   isosorbide  mononitrate (IMDUR ) 60 MG 24 hr tablet Take 1 tablet (60 mg total) by mouth every morning. 90 tablet 0   Melatonin 10 MG TABS Take 10 mg by mouth daily.     mirtazapine  (REMERON ) 15 MG tablet Take 15 mg by mouth at bedtime.     nitroGLYCERIN  (NITROSTAT ) 0.4 MG SL tablet Dissolve 1 tab under tongue as needed for chest pain. May repeat every 5 minutes x 2 doses. If no relief call 9-1-1. 25 tablet 11   ondansetron  (ZOFRAN ) 4 MG tablet Take 1 tablet (4 mg total) by mouth every 8 (eight) hours as needed for nausea or vomiting. 15 tablet 0   pantoprazole  (PROTONIX ) 40 MG tablet TAKE ONE TABLET BY MOUTH EVERY MORNING 90 tablet 0   zolpidem  (AMBIEN ) 5 MG tablet Take 5 mg by mouth at bedtime.     aspirin  EC 81 MG tablet Take 1 tablet (81 mg total) by mouth daily. (Patient not taking: Reported on 02/16/2023) 90 tablet 3   tamsulosin  (FLOMAX ) 0.4 MG CAPS capsule Take 0.8 mg by mouth daily. Take two (2) capsules (0.8 mg) by mouth daily.  (Patient not taking: Reported on 02/16/2023)  11   traMADol  (ULTRAM ) 50 MG tablet Take 50 mg by mouth every 6 (six) hours as needed for moderate pain or severe pain. (Patient not taking: Reported on 02/16/2023)      Home: Home Living Family/patient expects to be discharged to:: Private residence Living Arrangements: Children Available Help at Discharge: Family, Available 24 hours/day (Simultaneous filing. User may not have  seen previous data.) Type of Home: House (Simultaneous filing. User may not have seen previous data.) Home Access: Stairs to enter, Ramped entrance Entrance Stairs-Number of Steps: 3 in garage Entrance Stairs-Rails: Right Home Layout: One level Bathroom Shower/Tub: Tub/shower unit Home Equipment: Agricultural Consultant (2 wheels), Rollator (4 wheels), Cane - single point Additional Comments: Patient reports he prefers to take tub baths and has grab bars he uses to get in and out of the tub.  Functional History: Prior Function Prior  Level of Function : Driving, Needs assist Physical Assist : ADLs (physical) ADLs (physical): IADLs, Dressing Mobility Comments: Pt reports he needs PRN assist for bed mobility, transfers, gait. Pt reports he's ambulatory with rollator, denies falls, still drives to church. ADLs Comments: Patient needs occsional assist with donning/ doffing jackets or long sleeve shirts Functional Status:  Mobility: Bed Mobility Overal bed mobility: Needs Assistance Bed Mobility: Supine to Sit Supine to sit: Min assist, HOB elevated, Used rails General bed mobility comments: Patient in chair upon arrival Transfers Overall transfer level: Needs assistance Equipment used: Rolling walker (2 wheels) Transfers: Sit to/from Stand Sit to Stand: Min assist General transfer comment: Patient was able to scoot forward and back in chair with min verbal cues and assist to keep L foot in place to push. Ambulation/Gait Ambulation/Gait assistance: Mod assist Gait Distance (Feet): 14 Feet Assistive device: Rolling walker (2 wheels) Gait Pattern/deviations: Decreased step length - right, Decreased step length - left, Decreased stride length, Decreased dorsiflexion - right, Decreased dorsiflexion - left General Gait Details: Pt pushing RW out in front of him. Pt with difficulty advancing LLE at times, sometimes dragging foot, pt when pt exaggerates movement pt is able to advance LLE with increased  hip/knee flexion during swing phase but PT does provide assistance ~4 times for L foot advancement. PT also provides some assistance for RW management when turning. Gait velocity: decreased    ADL: ADL Overall ADL's : Needs assistance/impaired Eating/Feeding: Set up Eating/Feeding Details (indicate cue type and reason): Positioning will need to be upright due to lack of shoulder ROM Grooming: Minimal assistance Upper Body Bathing: Minimal assistance Lower Body Bathing: Moderate assistance Upper Body Dressing : Moderate assistance Lower Body Dressing: Moderate assistance Toilet Transfer: Moderate assistance Toileting- Clothing Manipulation and Hygiene: Moderate assistance Functional mobility during ADLs: Moderate assistance General ADL Comments: Patient needing cueing for safe use of walker, noted ataxic movements in LLE with stepping forward or backward with a tendency to flex at the hips with backward stepping. Also noted hyperextension of the LLE.  Patient SOB with standing <5 minutes.  Patient was able to release BUE in standing and / or use BUE for support  Cognition: Cognition Overall Cognitive Status: Within Functional Limits for tasks assessed (Simultaneous filing. User may not have seen previous data.) Arousal/Alertness: Awake/alert Orientation Level: Oriented X4 Attention: Selective Selective Attention: Appears intact Memory: Impaired Memory Impairment: Decreased recall of new information Executive Function: Self Correcting Self Correcting: Impaired Cognition Arousal: Alert Behavior During Therapy: WFL for tasks assessed/performed Overall Cognitive Status: Within Functional Limits for tasks assessed (Simultaneous filing. User may not have seen previous data.) General Comments: AxOx4  Blood pressure 114/60, pulse 81, temperature 97.8 F (36.6 C), temperature source Oral, resp. rate 18, SpO2 99%. Physical Exam Constitutional:      General: He is not in acute  distress. HENT:     Head: Normocephalic.     Right Ear: External ear normal.     Left Ear: External ear normal.     Nose: Nose normal.     Mouth/Throat:     Mouth: Mucous membranes are moist.  Eyes:     Pupils: Pupils are equal, round, and reactive to light.  Cardiovascular:     Rate and Rhythm: Normal rate.  Pulmonary:     Effort: Pulmonary effort is normal.  Abdominal:     Palpations: Abdomen is soft.     Comments: At end of my exam became nauseas and vomited.   Musculoskeletal:  Cervical back: Normal range of motion.  Skin:    General: Skin is warm.     Findings: Bruising present.     Comments: Old TKA scar right knee  Neurological:     Mental Status: He is alert.     Comments: Alert and oriented x 3 (miss date by one day). Normal insight and awareness. Reasonable memory. Normal language and speech sl dysarthric. Cranial nerve exam unremarkable except mild left central VII. MMT: Both shoulders limited by significant RTC injuries. RUE 4+ biceps, triceps, wrist and hand. LUE 3 to 3+/5 biceps, triceps, wrist and hand.  RLE 4/5 prox to disal. LLE 3+ to 4-/5 prox to distal. Sl decrease in LT left arm and leg. No abnl resting tone. DTR's 1+  Psychiatric:        Mood and Affect: Mood normal.        Behavior: Behavior normal.     Results for orders placed or performed during the hospital encounter of 03/13/23 (from the past 24 hours)  CBC     Status: Abnormal   Collection Time: 03/16/23  5:31 AM  Result Value Ref Range   WBC 8.1 4.0 - 10.5 K/uL   RBC 3.23 (L) 4.22 - 5.81 MIL/uL   Hemoglobin 9.9 (L) 13.0 - 17.0 g/dL   HCT 70.2 (L) 60.9 - 47.9 %   MCV 92.0 80.0 - 100.0 fL   MCH 30.7 26.0 - 34.0 pg   MCHC 33.3 30.0 - 36.0 g/dL   RDW 85.9 88.4 - 84.4 %   Platelets 294 150 - 400 K/uL   nRBC 0.0 0.0 - 0.2 %  Basic metabolic panel     Status: Abnormal   Collection Time: 03/16/23  5:31 AM  Result Value Ref Range   Sodium 136 135 - 145 mmol/L   Potassium 4.2 3.5 - 5.1 mmol/L    Chloride 101 98 - 111 mmol/L   CO2 23 22 - 32 mmol/L   Glucose, Bld 85 70 - 99 mg/dL   BUN 19 8 - 23 mg/dL   Creatinine, Ser 8.01 (H) 0.61 - 1.24 mg/dL   Calcium  8.7 (L) 8.9 - 10.3 mg/dL   GFR, Estimated 31 (L) >60 mL/min   Anion gap 12 5 - 15   VAS US  CAROTID Result Date: 03/15/2023 Carotid Arterial Duplex Study Patient Name:  Brandon Harris [facility]  Date of Exam:   03/15/2023 Medical Rec #: [id number]      Accession #:    [id number] Date of Birth: Jan 06, 1929     Patient Gender: M Patient Age:   64 years Exam Location:  [facility] Procedure:      VAS US  CAROTID Referring Phys: ELLOUISE MARI --------------------------------------------------------------------------------  Indications:       CVA, Carotid artery disease and Numbness. Risk Factors:      Hypertension, hyperlipidemia, no history of smoking, prior                    MI, coronary artery disease, prior CVA. Other Factors:     CABG X 5, new atrial fibrillation. Limitations        Today's exam was limited due to vessel tortuosity, heavy                    calcification resulting in significant shadowing, and                    respiratory variation. Comparison Study:  Prior carotid duplex done 03/19/22  at [facility] indicating                    1-39% ICA stenosis, bilaterally. Patient has a long history                    of technically difficult carotid studies limited by calcific                    plaque, acoustic shadowing, and vessel tortuoisity with                    varying results from study to study. Performing Technologist: Alberta Lis RVS  Examination Guidelines: A complete evaluation includes B-mode imaging, spectral Doppler, color Doppler, and power Doppler as needed of all accessible portions of each vessel. Bilateral testing is considered an integral part of a complete examination. Limited examinations for reoccurring indications may be performed as noted.  Right Carotid Findings:  +----------+--------+--------+--------+----------------------+---------+           PSV cm/sEDV cm/sStenosisPlaque Description    Comments  +----------+--------+--------+--------+----------------------+---------+ CCA Prox  40      13              irregular and calcific          +----------+--------+--------+--------+----------------------+---------+ CCA Distal66      22              irregular and calcific          +----------+--------+--------+--------+----------------------+---------+ ICA Prox  182     37      40-59%  calcific              Shadowing +----------+--------+--------+--------+----------------------+---------+ ICA Mid   54      15                                    tortuous  +----------+--------+--------+--------+----------------------+---------+ ICA Distal47      23                                    tortuous  +----------+--------+--------+--------+----------------------+---------+ ECA       66      9                                               +----------+--------+--------+--------+----------------------+---------+ +----------+--------+-------+--------+-------------------+           PSV cm/sEDV cmsDescribeArm Pressure (mmHG) +----------+--------+-------+--------+-------------------+ Dlarojcpjw50                                         +----------+--------+-------+--------+-------------------+ +---------+--------+--+--------+--+---------+ VertebralPSV cm/s55EDV cm/s15Antegrade +---------+--------+--+--------+--+---------+  Left Carotid Findings: +----------+--------+--------+--------+------------------+--------+           PSV cm/sEDV cm/sStenosisPlaque DescriptionComments +----------+--------+--------+--------+------------------+--------+ CCA Prox  60      9                                          +----------+--------+--------+--------+------------------+--------+ CCA Distal61      3                                           +----------+--------+--------+--------+------------------+--------+  ICA Prox  163     40      40-59%                    tortuous +----------+--------+--------+--------+------------------+--------+ ICA Mid   105     23                                tortuous +----------+--------+--------+--------+------------------+--------+ ICA Distal                                          tortuous +----------+--------+--------+--------+------------------+--------+ ECA       130     13                                         +----------+--------+--------+--------+------------------+--------+ +----------+--------+--------+--------+-------------------+           PSV cm/sEDV cm/sDescribeArm Pressure (mmHG) +----------+--------+--------+--------+-------------------+ Dlarojcpjw32                                          +----------+--------+--------+--------+-------------------+ +---------+--------+--------+--------+ VertebralPSV cm/sEDV cm/soccluded +---------+--------+--------+--------+   Summary: Right Carotid: Velocities in the right ICA are consistent with a 40-59%                stenosis. Stenosis may be underestimated secondary to bulky                plaque and vessel tortuosity. Left Carotid: Velocities in the left ICA are consistent with a 40-59% stenosis.               Stenosis may be underestimated secondary to bulky plaque and               vessel tortuosity. Vertebrals:  Right vertebral artery demonstrates antegrade flow. Left vertebral              artery demonstrates an occlusion. Subclavians: Normal flow hemodynamics were seen in bilateral subclavian              arteries. *See table(s) above for measurements and observations.  Electronically signed by Lonni Gaskins MD on 03/15/2023 at 3:19:04 PM.    Final    MR ANGIO NECK WO CONTRAST Result Date: 03/14/2023 CLINICAL DATA:  Acute neurologic deficit EXAM: MRA NECK WITHOUT CONTRAST TECHNIQUE: Angiographic images  of the neck were acquired using MRA technique without intravenous contrast. Carotid stenosis measurements (when applicable) are obtained utilizing NASCET criteria, using the distal internal carotid diameter as the denominator. COMPARISON:  None Available. FINDINGS: Severely technically degraded MRA. There is no flow related enhancement in any vessel, which is likely artifactual. There is a 3 vessel aortic arch branching pattern. The study is essentially nondiagnostic. IMPRESSION: Severely technically degraded MRA, essentially nondiagnostic. Electronically Signed   By: Franky Stanford M.D.   On: 03/14/2023 21:41   MR ANGIO HEAD WO CONTRAST Result Date: 03/14/2023 CLINICAL DATA:  Acute neurologic deficit EXAM: MRA HEAD WITHOUT CONTRAST TECHNIQUE: Angiographic images of the Circle of Willis were acquired using MRA technique without intravenous contrast. COMPARISON:  None Available. FINDINGS: POSTERIOR CIRCULATION: Vertebral arteries are normal. No proximal occlusion of the anterior or inferior cerebellar arteries. Basilar artery is diminutive  distally. Superior cerebellar arteries are normal. Posterior cerebral arteries are normal. ANTERIOR CIRCULATION: Intracranial internal carotid arteries are normal. Anterior cerebral arteries are normal. Middle cerebral arteries are normal. Anatomic Variants: Fetal predominant origins of the posterior cerebral arteries. IMPRESSION: Normal intracranial MRA. Electronically Signed   By: Franky Stanford M.D.   On: 03/14/2023 21:23    Assessment/Plan: Diagnosis: 89 year old male with right MCA infarcts, likely embolic due to new onset A-fib Does the need for close, 24 hr/day medical supervision in concert with the patient's rehab needs make it unreasonable for this patient to be served in a less intensive setting? Yes Co-Morbidities requiring supervision/potential complications:  -New onset A-fib now on Eliquis  -Hypertension -E Coli UTI -Nausea and vomiting, hx of mesenteric  artery stenosis -CKD IV -hx of prostate cancer -bilateral RTC injuries Due to bladder management, bowel management, safety, skin/wound care, disease management, medication administration, and patient education, does the patient require 24 hr/day rehab nursing? Yes Does the patient require coordinated care of a physician, rehab nurse, therapy disciplines of PT, OT and SLP to address physical and functional deficits in the context of the above medical diagnosis(es)? Yes Addressing deficits in the following areas: balance, endurance, locomotion, strength, transferring, bowel/bladder control, bathing, dressing, feeding, grooming, toileting, cognition, and psychosocial support Can the patient actively participate in an intensive therapy program of at least 3 hrs of therapy per day at least 5 days per week? Yes The potential for patient to make measurable gains while on inpatient rehab is excellent Anticipated functional outcomes upon discharge from inpatient rehab are supervision  with PT, supervision with OT, modified independent and supervision with SLP. Estimated rehab length of stay to reach the above functional goals is: 11-15 days Anticipated discharge destination: Home Overall Rehab/Functional Prognosis: excellent  POST ACUTE RECOMMENDATIONS: This patient's condition is appropriate for continued rehabilitative care in the following setting: CIR Patient has agreed to participate in recommended program. Yes Note that insurance prior authorization may be required for reimbursement for recommended care.  Comment: Pt was really quite active in his home and around property prior to this stroke. Daughter at home to help. Rehab Admissions Coordinator to follow up.     Thanks,  Arthea ONEIDA Gunther, MD 03/16/2023

## 2023-03-17 DIAGNOSIS — I639 Cerebral infarction, unspecified: Secondary | ICD-10-CM | POA: Diagnosis not present

## 2023-03-17 DIAGNOSIS — N184 Chronic kidney disease, stage 4 (severe): Secondary | ICD-10-CM | POA: Diagnosis not present

## 2023-03-17 DIAGNOSIS — I4819 Other persistent atrial fibrillation: Secondary | ICD-10-CM | POA: Diagnosis not present

## 2023-03-17 DIAGNOSIS — D649 Anemia, unspecified: Secondary | ICD-10-CM | POA: Diagnosis not present

## 2023-03-17 LAB — BASIC METABOLIC PANEL
Anion gap: 10 (ref 5–15)
BUN: 18 mg/dL (ref 8–23)
CO2: 23 mmol/L (ref 22–32)
Calcium: 8.7 mg/dL — ABNORMAL LOW (ref 8.9–10.3)
Chloride: 104 mmol/L (ref 98–111)
Creatinine, Ser: 2.06 mg/dL — ABNORMAL HIGH (ref 0.61–1.24)
GFR, Estimated: 30 mL/min — ABNORMAL LOW (ref >60)
Glucose, Bld: 91 mg/dL (ref 70–99)
Potassium: 4.2 mmol/L (ref 3.5–5.1)
Sodium: 137 mmol/L (ref 135–145)

## 2023-03-17 LAB — CBC
HCT: 28.7 % — ABNORMAL LOW (ref 39.0–52.0)
Hemoglobin: 9.3 g/dL — ABNORMAL LOW (ref 13.0–17.0)
MCH: 30 pg (ref 26.0–34.0)
MCHC: 32.4 g/dL (ref 30.0–36.0)
MCV: 92.6 fL (ref 80.0–100.0)
Platelets: 294 10*3/uL (ref 150–400)
RBC: 3.1 MIL/uL — ABNORMAL LOW (ref 4.22–5.81)
RDW: 14.1 % (ref 11.5–15.5)
WBC: 9.3 10*3/uL (ref 4.0–10.5)
nRBC: 0 % (ref 0.0–0.2)

## 2023-03-17 MED ORDER — FERROUS SULFATE 325 (65 FE) MG PO TABS
325.0000 mg | ORAL_TABLET | Freq: Every day | ORAL | Status: DC
  Administered 2023-03-17 – 2023-03-19 (×3): 325 mg via ORAL
  Filled 2023-03-17 (×3): qty 1

## 2023-03-17 MED ORDER — MIRTAZAPINE 15 MG PO TABS
15.0000 mg | ORAL_TABLET | Freq: Every day | ORAL | Status: DC
  Administered 2023-03-17 – 2023-03-18 (×2): 15 mg via ORAL
  Filled 2023-03-17 (×2): qty 1

## 2023-03-17 MED ORDER — ALPRAZOLAM 0.25 MG PO TABS
0.2500 mg | ORAL_TABLET | Freq: Every evening | ORAL | Status: DC | PRN
  Administered 2023-03-17: 0.25 mg via ORAL
  Filled 2023-03-17: qty 1

## 2023-03-17 NOTE — Care Management Important Message (Signed)
 Important Message  Patient Details  Name: Brandon Harris MRN: [id number] Date of Birth: 1928-09-23   Important Message Given:  Yes - Medicare IM     Wynonia Hedges 03/17/2023, 2:46 PM

## 2023-03-17 NOTE — Plan of Care (Signed)
 Problem: Education: Goal: Knowledge of disease or condition will improve 03/17/2023 0529 by Jori Roderic CROME, RN Outcome: Progressing 03/16/2023 2216 by Jori Roderic CROME, RN Outcome: Progressing Goal: Knowledge of secondary prevention will improve (MUST DOCUMENT ALL) 03/17/2023 0529 by Jori Roderic CROME, RN Outcome: Progressing 03/16/2023 2216 by Jori Roderic CROME, RN Outcome: Progressing Goal: Knowledge of patient specific risk factors will improve (DELETE if not current risk factor) 03/17/2023 0529 by Jori Roderic CROME, RN Outcome: Progressing 03/16/2023 2216 by Jori Roderic CROME, RN Outcome: Progressing   Problem: Ischemic Stroke/TIA Tissue Perfusion: Goal: Complications of ischemic stroke/TIA will be minimized 03/17/2023 0529 by Jori Roderic CROME, RN Outcome: Progressing 03/16/2023 2216 by Jori Roderic CROME, RN Outcome: Progressing   Problem: Coping: Goal: Will verbalize positive feelings about self 03/17/2023 0529 by Jori Roderic CROME, RN Outcome: Progressing 03/16/2023 2216 by Jori Roderic CROME, RN Outcome: Progressing Goal: Will identify appropriate support needs 03/17/2023 0529 by Jori Roderic CROME, RN Outcome: Progressing 03/16/2023 2216 by Jori Roderic CROME, RN Outcome: Progressing   Problem: Health Behavior/Discharge Planning: Goal: Ability to manage health-related needs will improve 03/17/2023 0529 by Jori Roderic CROME, RN Outcome: Progressing 03/16/2023 2216 by Jori Roderic CROME, RN Outcome: Progressing Goal: Goals will be collaboratively established with patient/family 03/17/2023 0529 by Jori Roderic CROME, RN Outcome: Progressing 03/16/2023 2216 by Jori Roderic CROME, RN Outcome: Progressing   Problem: Self-Care: Goal: Ability to participate in self-care as condition permits will improve 03/17/2023 0529 by Jori Roderic CROME, RN Outcome: Progressing 03/16/2023 2216 by Jori Roderic CROME, RN Outcome: Progressing Goal: Verbalization  of feelings and concerns over difficulty with self-care will improve 03/17/2023 0529 by Jori Roderic CROME, RN Outcome: Progressing 03/16/2023 2216 by Jori Roderic CROME, RN Outcome: Progressing Goal: Ability to communicate needs accurately will improve 03/17/2023 0529 by Jori Roderic CROME, RN Outcome: Progressing 03/16/2023 2216 by Jori Roderic CROME, RN Outcome: Progressing   Problem: Nutrition: Goal: Risk of aspiration will decrease 03/17/2023 0529 by Jori Roderic CROME, RN Outcome: Progressing 03/16/2023 2216 by Jori Roderic CROME, RN Outcome: Progressing Goal: Dietary intake will improve 03/17/2023 0529 by Jori Roderic CROME, RN Outcome: Progressing 03/16/2023 2216 by Jori Roderic CROME, RN Outcome: Progressing   Problem: Education: Goal: Knowledge of General Education information will improve Description: Including pain rating scale, medication(s)/side effects and non-pharmacologic comfort measures 03/17/2023 0529 by Jori Roderic CROME, RN Outcome: Progressing 03/16/2023 2216 by Jori Roderic CROME, RN Outcome: Progressing   Problem: Health Behavior/Discharge Planning: Goal: Ability to manage health-related needs will improve 03/17/2023 0529 by Jori Roderic CROME, RN Outcome: Progressing 03/16/2023 2216 by Jori Roderic CROME, RN Outcome: Progressing   Problem: Clinical Measurements: Goal: Ability to maintain clinical measurements within normal limits will improve 03/17/2023 0529 by Jori Roderic CROME, RN Outcome: Progressing 03/16/2023 2216 by Jori Roderic CROME, RN Outcome: Progressing Goal: Will remain free from infection 03/17/2023 0529 by Jori Roderic CROME, RN Outcome: Progressing 03/16/2023 2216 by Jori Roderic CROME, RN Outcome: Progressing Goal: Diagnostic test results will improve 03/17/2023 0529 by Jori Roderic CROME, RN Outcome: Progressing 03/16/2023 2216 by Jori Roderic CROME, RN Outcome: Progressing Goal: Respiratory complications will  improve 03/17/2023 0529 by Jori Roderic CROME, RN Outcome: Progressing 03/16/2023 2216 by Jori Roderic CROME, RN Outcome: Progressing Goal: Cardiovascular complication will be avoided 03/17/2023 0529 by Jori Roderic CROME, RN Outcome: Progressing 03/16/2023 2216 by Jori Roderic CROME, RN Outcome: Progressing   Problem: Activity: Goal: Risk for activity intolerance will decrease 03/17/2023 0529 by Jori Roderic CROME, RN Outcome: Progressing 03/16/2023 2216 by  Jori Roderic CROME, RN Outcome: Progressing   Problem: Nutrition: Goal: Adequate nutrition will be maintained 03/17/2023 0529 by Jori Roderic CROME, RN Outcome: Progressing 03/16/2023 2216 by Jori Roderic CROME, RN Outcome: Progressing   Problem: Coping: Goal: Level of anxiety will decrease 03/17/2023 0529 by Jori Roderic CROME, RN Outcome: Progressing 03/16/2023 2216 by Jori Roderic CROME, RN Outcome: Progressing   Problem: Elimination: Goal: Will not experience complications related to bowel motility 03/17/2023 0529 by Jori Roderic CROME, RN Outcome: Progressing 03/16/2023 2216 by Jori Roderic CROME, RN Outcome: Progressing Goal: Will not experience complications related to urinary retention 03/17/2023 0529 by Jori Roderic CROME, RN Outcome: Progressing 03/16/2023 2216 by Jori Roderic CROME, RN Outcome: Progressing   Problem: Pain Managment: Goal: General experience of comfort will improve and/or be controlled 03/17/2023 0529 by Jori Roderic CROME, RN Outcome: Progressing 03/16/2023 2216 by Jori Roderic CROME, RN Outcome: Progressing   Problem: Safety: Goal: Ability to remain free from injury will improve 03/17/2023 0529 by Jori Roderic CROME, RN Outcome: Progressing 03/16/2023 2216 by Jori Roderic CROME, RN Outcome: Progressing   Problem: Skin Integrity: Goal: Risk for impaired skin integrity will decrease 03/17/2023 0529 by Jori Roderic CROME, RN Outcome: Progressing 03/16/2023 2216 by Jori Roderic CROME, RN Outcome: Progressing

## 2023-03-17 NOTE — Progress Notes (Signed)
 Inpatient Rehab Admissions Coordinator:   Continue to await insurance determination.   Loye Rumble, PT, DPT Admissions Coordinator [phone] 03/17/23  12:32 PM

## 2023-03-17 NOTE — Progress Notes (Signed)
 PROGRESS NOTE    Brandon Harris  FMW:[id number] DOB: 1928-04-02 DOA: 03/13/2023 PCP: Seabron Lenis, MD    Chief Complaint  Patient presents with   Numbness    Brief Narrative:  Patient is a pleasant 89 year old gentleman history of CAD status post CABG, CKD stage IV, history of prior CVA, hypertension, mesenteric artery stenosis, history of prostate cancer initially presented to the ED 4 days prior to admission with abdominal pain, CT chest abdomen pelvis had findings concerning for chronic proctitis patient discharged on Augmentin .  Patient presented back to the ED with complaints of left upper and lower extremity numbness ongoing for 48 hours, MRI brain done concerning for acute right frontal temporal parietal stroke.  Patient also noted to be in new onset A-fib started on heparin  drip.  Patient admitted for stroke workup.  Patient seen in consultation by GI, neurology, cardiology.   Assessment & Plan:   Principal Problem:   Acute CVA (cerebrovascular accident) [facility]) Active Problems:   Essential hypertension   Hyperlipidemia   CKD (chronic kidney disease) stage 4, GFR 15-29 ml/min (HCC)   Gastroesophageal reflux disease   Chronic anemia   UTI (urinary tract infection)   Proctitis   Atrial fibrillation (HCC)  #1 acute right frontoparietal CVA -Likely cardioembolic in nature secondary to new onset atrial fibrillation. -Patient with clinical improvement of left upper and left lower extremity numbness. -Head CT done with no acute intracranial findings, chronic microvascular ischemic changes and cerebral volume loss. -MRI brain done with small acute right frontoparietal infarcts.  Moderate to severe chronic small vessel ischemic disease and numerous chronic infarcts.  Suspected occlusion of distal left vertebral artery. -MRA head and neck done with normal intracranial MRA.  Severely technically degraded MRA of the neck essentially nondiagnostic. -2D echo with EF of 65 to 70%,NWMA,  mild LVH, mildly reduced right ventricular systolic function, mildly dilated left atrial size, trivial MVR, no atrial level shunt detected by color-flow Doppler. -Preliminary carotid ultrasound done with bilateral 40 to 59% ICA stenosis which may be underestimated secondary to bulky plaque and vessel tortuosity.  Right vertebral artery demonstrated antegrade flow.  Left vertebral artery demonstrates an occlusion. -Fasting lipid panel with LDL of 35. -Hemoglobin A1c 5.5. -Patient was on IV heparin  for anticoagulation and subsequently transition to Eliquis  which he is tolerating. -Patient active with no falls in discussion with daughter. -Continue statin. -Aspirin  discontinued. -PT/OT/SLP. -Therapy recommending CIR placement. -Neurology was following, but have signed off. -Outpatient follow-up with neurology.  2.  New onset A-fib -Patient noted to be on new onset A-fib on admission and had presented with acute CVA. -CHA2DS2VASC score is 6. -TSH within normal limits. -2D echo with EF of 65 to 70%, mildly reduced RV function, mildly dilated left atrial size,NWMA. -Currently rate controlled on carvedilol  12.5 mg twice daily. -Was on IV heparin  on admission and has been transitioned to Eliquis  2.5 mg twice daily due to age and renal function per cardiology recommendations. -Per cardiology once stable and after around 3 to 4 weeks may consider DCCV at that time if patient still in A-fib. -Will need outpatient follow-up with cardiology. -Appreciate cardiology input and recommendations.  3.  Hypertension -BP currently controlled. -Continue Coreg , decrease dose of Imdur . -Continue to hold hydralazine  and Norvasc .  4.  CAD status post CABG (2001) -Currently stable and asymptomatic. -Continue Eliquis , statin, beta-blocker, decrease dose of Imdur . -Continue to hold hydralazine  and Norvasc  due to soft blood pressure.   5.  History of prostate cancer -Outpatient follow-up.  6.  Abdominal pain,  dysphagia//nausea and vomiting/history of mesenteric artery stenosis/?  Proctitis -Patient seen in consultation by GI who are recommending empiric PPI daily, sucralfate  1 g p.o. 4 times daily with no plans for endoscopic intervention at this time and outpatient follow-up with GI if recurrence of symptoms.  7.  Recent diagnosis of E. coli UTI -Urine cultures from 03/09/2023 resistant to ciprofloxacin  otherwise pansensitive. -Transitioned from IV Rocephin  to Augmentin .   8.  CKD stage IV -Stable.  9.  Severe protein calorie malnutrition -Nutritional supplementation.    DVT prophylaxis: Heparin  Code Status: Full Family Communication: Updated patient, daughter at bedside. Disposition: Patient medically stable.  Awaiting placement in CIR.  Status is: Inpatient Remains inpatient appropriate because: Severity of illness   Consultants:  GI: Dr. Kriss 03/14/2023 Neurology: Dr.Khaliqdina 03/14/2023 Cardiology: Dr. Mona 03/15/2023 CIR  Procedures:  CT head 03/13/2023 MRI brain 03/13/2023 MRA head and neck 03/14/2023 Carotid ultrasound 03/15/2023 2D echo 03/14/2023   Antimicrobials:  Anti-infectives (From admission, onward)    Start     Dose/Rate Route Frequency Ordered Stop   03/16/23 2200  amoxicillin -clavulanate (AUGMENTIN ) 500-125 MG per tablet 1 tablet        1 tablet Oral Every 12 hours 03/16/23 1308 03/24/23 0959   03/16/23 1545  metroNIDAZOLE  (FLAGYL ) tablet 500 mg        500 mg Oral Every 12 hours 03/16/23 1450     03/16/23 1245  metroNIDAZOLE  (FLAGYL ) tablet 500 mg  Status:  Discontinued        500 mg Oral Every 12 hours 03/16/23 1150 03/16/23 1308   03/14/23 0115  metroNIDAZOLE  (FLAGYL ) IVPB 500 mg  Status:  Discontinued        500 mg 100 mL/hr over 60 Minutes Intravenous 2 times daily 03/14/23 0055 03/15/23 1553   03/14/23 0100  cefTRIAXone  (ROCEPHIN ) 2 g in sodium chloride  0.9 % 100 mL IVPB  Status:  Discontinued        2 g 200 mL/hr over 30 Minutes Intravenous Every 24 hours  03/14/23 0055 03/16/23 1308         Subjective: Patient lying in bed.  Denies any chest pain or shortness of breath.  No abdominal pain.  Per daughter patient not been getting good sleep as he is on medication for sleep prior to admission that she thinks he is not on here.  Left-sided weakness improving.  Speech improved.      Objective: Vitals:   03/17/23 0014 03/17/23 0357 03/17/23 0800 03/17/23 1137  BP: (!) 146/69 (!) 145/90 (!) 156/67 (!) 140/70  Pulse: 89 98 90 80  Resp: 18 18 17 18   Temp: 98.1 F (36.7 C) 97.7 F (36.5 C) 98.5 F (36.9 C) 98.1 F (36.7 C)  TempSrc: Oral Oral Oral Oral  SpO2: 98% 99% 97% 99%  Weight:      Height:        Intake/Output Summary (Last 24 hours) at 03/17/2023 1232 Last data filed at 03/17/2023 1141 Gross per 24 hour  Intake 240 ml  Output 1750 ml  Net -1510 ml   Filed Weights   03/16/23 1210  Weight: 92.1 kg    Examination:  General exam: NAD. Respiratory system: Lungs clear to auscultation bilaterally.  No wheezes, no crackles, no rhonchi.  Fair air movement.  Speaking in full sentences. Cardiovascular system: Irregularly irregular.  No JVD, no murmurs rubs or gallops.  No pitting lower extremity edema.  Gastrointestinal system: Abdomen is soft, nontender, nondistended, positive bowel sounds.  No rebound.  No guarding.  Central nervous system: Alert and oriented.  Moving extremities spontaneously.  No focal neurological deficits.  Extremities: 4/5 left upper extremity and left lower extremity strength.  5/5 right upper extremity strength and right lower extremity strength.  Skin: No rashes, lesions or ulcers Psychiatry: Judgement and insight appear fair to normal. Mood & affect appropriate.     Data Reviewed: I have personally reviewed following labs and imaging studies  CBC: Recent Labs  Lab 03/13/23 1207 03/14/23 0323 03/15/23 0557 03/16/23 0531 03/17/23 0614  WBC 7.6 8.3 8.9 8.1 9.3  NEUTROABS 4.3  --   --   --   --    HGB 9.7* 9.9* 10.1* 9.9* 9.3*  HCT 30.2* 30.3* 30.9* 29.7* 28.7*  MCV 94.7 93.5 92.2 92.0 92.6  PLT 294 296 307 294 294    Basic Metabolic Panel: Recent Labs  Lab 03/13/23 1207 03/14/23 0323 03/15/23 0944 03/16/23 0531 03/17/23 0614  NA 133*  --  135 136 137  K 4.5  --  4.0 4.2 4.2  CL 100  --  102 101 104  CO2 22  --  21* 23 23  GLUCOSE 117*  --  114* 85 91  BUN 20  --  16 19 18   CREATININE 2.26* 2.08* 2.01* 1.98* 2.06*  CALCIUM  8.7*  --  9.2 8.7* 8.7*  MG  --   --  2.0  --   --   PHOS  --   --  3.6  --   --     GFR: Estimated Creatinine Clearance: 26.5 mL/min (A) (by C-G formula based on SCr of 2.06 mg/dL (H)).  Liver Function Tests: Recent Labs  Lab 03/15/23 0944  ALBUMIN 3.5    CBG: Recent Labs  Lab 03/13/23 1115 03/15/23 0911  GLUCAP 102* 99     Recent Results (from the past 240 hours)  Resp panel by RT-PCR (RSV, Flu A&B, Covid) Anterior Nasal Swab     Status: None   Collection Time: 03/09/23  7:16 AM   Specimen: Anterior Nasal Swab  Result Value Ref Range Status   SARS Coronavirus 2 by RT PCR NEGATIVE NEGATIVE Final    Comment: (NOTE) SARS-CoV-2 target nucleic acids are NOT DETECTED.  The SARS-CoV-2 RNA is generally detectable in upper respiratory specimens during the acute phase of infection. The lowest concentration of SARS-CoV-2 viral copies this assay can detect is 138 copies/mL. A negative result does not preclude SARS-Cov-2 infection and should not be used as the sole basis for treatment or other patient management decisions. A negative result may occur with  improper specimen collection/handling, submission of specimen other than nasopharyngeal swab, presence of viral mutation(s) within the areas targeted by this assay, and inadequate number of viral copies(<138 copies/mL). A negative result must be combined with clinical observations, patient history, and epidemiological information. The expected result is Negative.  Fact Sheet for  Patients:  bloggercourse.com  Fact Sheet for Healthcare Providers:  seriousbroker.it  This test is no t yet approved or cleared by the [place]  FDA and  has been authorized for detection and/or diagnosis of SARS-CoV-2 by FDA under an Emergency Use Authorization (EUA). This EUA will remain  in effect (meaning this test can be used) for the duration of the COVID-19 declaration under Section 564(b)(1) of the Act, 21 U.S.C.section 360bbb-3(b)(1), unless the authorization is terminated  or revoked sooner.       Influenza A by PCR NEGATIVE NEGATIVE Final   Influenza B by  PCR NEGATIVE NEGATIVE Final    Comment: (NOTE) The Xpert Xpress SARS-CoV-2/FLU/RSV plus assay is intended as an aid in the diagnosis of influenza from Nasopharyngeal swab specimens and should not be used as a sole basis for treatment. Nasal washings and aspirates are unacceptable for Xpert Xpress SARS-CoV-2/FLU/RSV testing.  Fact Sheet for Patients: bloggercourse.com  Fact Sheet for Healthcare Providers: seriousbroker.it  This test is not yet approved or cleared by the [place]  FDA and has been authorized for detection and/or diagnosis of SARS-CoV-2 by FDA under an Emergency Use Authorization (EUA). This EUA will remain in effect (meaning this test can be used) for the duration of the COVID-19 declaration under Section 564(b)(1) of the Act, 21 U.S.C. section 360bbb-3(b)(1), unless the authorization is terminated or revoked.     Resp Syncytial Virus by PCR NEGATIVE NEGATIVE Final    Comment: (NOTE) Fact Sheet for Patients: bloggercourse.com  Fact Sheet for Healthcare Providers: seriousbroker.it  This test is not yet approved or cleared by the [place]  FDA and has been authorized for detection and/or diagnosis of SARS-CoV-2 by FDA under an Emergency  Use Authorization (EUA). This EUA will remain in effect (meaning this test can be used) for the duration of the COVID-19 declaration under Section 564(b)(1) of the Act, 21 U.S.C. section 360bbb-3(b)(1), unless the authorization is terminated or revoked.  Performed at [facility], [address], [place], [place] [address]   Urine Culture     Status: Abnormal   Collection Time: 03/09/23 11:35 AM   Specimen: Urine, Clean Catch  Result Value Ref Range Status   Specimen Description   Final    URINE, CLEAN CATCH Performed at [facility], [address], [place], [place] [address]    Special Requests   Final    NONE Performed at [facility], [address], [place], [place] [address]    Culture >=100,000 COLONIES/mL ESCHERICHIA COLI (A)  Final   Report Status 03/11/2023 FINAL  Final   Organism ID, Bacteria ESCHERICHIA COLI (A)  Final      Susceptibility   Escherichia coli - MIC*    AMPICILLIN <=2 SENSITIVE Sensitive     CEFAZOLIN  <=4 SENSITIVE Sensitive     CEFEPIME <=0.12 SENSITIVE Sensitive     CEFTRIAXONE  <=0.25 SENSITIVE Sensitive     CIPROFLOXACIN  >=4 RESISTANT Resistant     GENTAMICIN <=1 SENSITIVE Sensitive     IMIPENEM <=0.25 SENSITIVE Sensitive     NITROFURANTOIN <=16 SENSITIVE Sensitive     TRIMETH /SULFA  <=20 SENSITIVE Sensitive     AMPICILLIN/SULBACTAM <=2 SENSITIVE Sensitive     PIP/TAZO <=4 SENSITIVE Sensitive ug/mL    * >=100,000 COLONIES/mL ESCHERICHIA COLI         Radiology Studies: VAS US  CAROTID Result Date: 03/15/2023 Carotid Arterial Duplex Study Patient Name:  MARION ROSENBERRY [facility]  Date of Exam:   03/15/2023 Medical Rec #: [id number]      Accession #:    [id number] Date of Birth: 1929-02-05     Patient Gender: M Patient Age:   24 years Exam Location:  [facility] Procedure:      VAS US  CAROTID Referring Phys: ELLOUISE MARI  --------------------------------------------------------------------------------  Indications:       CVA, Carotid artery disease and Numbness. Risk Factors:      Hypertension, hyperlipidemia, no history of smoking, prior                    MI, coronary artery disease, prior CVA.  Other Factors:     CABG X 5, new atrial fibrillation. Limitations        Today's exam was limited due to vessel tortuosity, heavy                    calcification resulting in significant shadowing, and                    respiratory variation. Comparison Study:  Prior carotid duplex done 03/19/22 at [facility] indicating                    1-39% ICA stenosis, bilaterally. Patient has a long history                    of technically difficult carotid studies limited by calcific                    plaque, acoustic shadowing, and vessel tortuoisity with                    varying results from study to study. Performing Technologist: Alberta Lis RVS  Examination Guidelines: A complete evaluation includes B-mode imaging, spectral Doppler, color Doppler, and power Doppler as needed of all accessible portions of each vessel. Bilateral testing is considered an integral part of a complete examination. Limited examinations for reoccurring indications may be performed as noted.  Right Carotid Findings: +----------+--------+--------+--------+----------------------+---------+           PSV cm/sEDV cm/sStenosisPlaque Description    Comments  +----------+--------+--------+--------+----------------------+---------+ CCA Prox  40      13              irregular and calcific          +----------+--------+--------+--------+----------------------+---------+ CCA Distal66      22              irregular and calcific          +----------+--------+--------+--------+----------------------+---------+ ICA Prox  182     37      40-59%  calcific              Shadowing +----------+--------+--------+--------+----------------------+---------+ ICA  Mid   54      15                                    tortuous  +----------+--------+--------+--------+----------------------+---------+ ICA Distal47      23                                    tortuous  +----------+--------+--------+--------+----------------------+---------+ ECA       66      9                                               +----------+--------+--------+--------+----------------------+---------+ +----------+--------+-------+--------+-------------------+           PSV cm/sEDV cmsDescribeArm Pressure (mmHG) +----------+--------+-------+--------+-------------------+ Dlarojcpjw50                                         +----------+--------+-------+--------+-------------------+ +---------+--------+--+--------+--+---------+ VertebralPSV cm/s55EDV cm/s15Antegrade +---------+--------+--+--------+--+---------+  Left Carotid Findings: +----------+--------+--------+--------+------------------+--------+  PSV cm/sEDV cm/sStenosisPlaque DescriptionComments +----------+--------+--------+--------+------------------+--------+ CCA Prox  60      9                                          +----------+--------+--------+--------+------------------+--------+ CCA Distal61      3                                          +----------+--------+--------+--------+------------------+--------+ ICA Prox  163     40      40-59%                    tortuous +----------+--------+--------+--------+------------------+--------+ ICA Mid   105     23                                tortuous +----------+--------+--------+--------+------------------+--------+ ICA Distal                                          tortuous +----------+--------+--------+--------+------------------+--------+ ECA       130     13                                         +----------+--------+--------+--------+------------------+--------+  +----------+--------+--------+--------+-------------------+           PSV cm/sEDV cm/sDescribeArm Pressure (mmHG) +----------+--------+--------+--------+-------------------+ Dlarojcpjw32                                          +----------+--------+--------+--------+-------------------+ +---------+--------+--------+--------+ VertebralPSV cm/sEDV cm/soccluded +---------+--------+--------+--------+   Summary: Right Carotid: Velocities in the right ICA are consistent with a 40-59%                stenosis. Stenosis may be underestimated secondary to bulky                plaque and vessel tortuosity. Left Carotid: Velocities in the left ICA are consistent with a 40-59% stenosis.               Stenosis may be underestimated secondary to bulky plaque and               vessel tortuosity. Vertebrals:  Right vertebral artery demonstrates antegrade flow. Left vertebral              artery demonstrates an occlusion. Subclavians: Normal flow hemodynamics were seen in bilateral subclavian              arteries. *See table(s) above for measurements and observations.  Electronically signed by Lonni Gaskins MD on 03/15/2023 at 3:19:04 PM.    Final         Scheduled Meds:  amoxicillin -clavulanate  1 tablet Oral Q12H   apixaban   2.5 mg Oral BID   atorvastatin   80 mg Oral QPM   carvedilol   12.5 mg Oral BID WC   isosorbide  mononitrate  15 mg Oral Daily   metroNIDAZOLE   500 mg Oral Q12H   pantoprazole   40 mg Oral Daily  sucralfate   1 g Oral TID WC & HS   Continuous Infusions:  sodium chloride  Stopped (03/17/23 1136)     LOS: 3 days    Time spent: 35 minutes    Toribio Hummer, MD [facility]   To contact the attending provider between 7A-7P or the covering provider during after hours 7P-7A, please log into the web site [email or web address] and access using universal [place] password for that web site. If you do not have the password, please call the hospital operator.  03/17/2023,  12:32 PM

## 2023-03-17 NOTE — Plan of Care (Signed)

## 2023-03-18 DIAGNOSIS — I4819 Other persistent atrial fibrillation: Secondary | ICD-10-CM | POA: Diagnosis not present

## 2023-03-18 DIAGNOSIS — I639 Cerebral infarction, unspecified: Secondary | ICD-10-CM | POA: Diagnosis not present

## 2023-03-18 DIAGNOSIS — I480 Paroxysmal atrial fibrillation: Secondary | ICD-10-CM | POA: Diagnosis not present

## 2023-03-18 LAB — CBC
HCT: 28.5 % — ABNORMAL LOW (ref 39.0–52.0)
Hemoglobin: 9.3 g/dL — ABNORMAL LOW (ref 13.0–17.0)
MCH: 30.2 pg (ref 26.0–34.0)
MCHC: 32.6 g/dL (ref 30.0–36.0)
MCV: 92.5 fL (ref 80.0–100.0)
Platelets: 289 10*3/uL (ref 150–400)
RBC: 3.08 MIL/uL — ABNORMAL LOW (ref 4.22–5.81)
RDW: 14.1 % (ref 11.5–15.5)
WBC: 10.6 10*3/uL — ABNORMAL HIGH (ref 4.0–10.5)
nRBC: 0 % (ref 0.0–0.2)

## 2023-03-18 LAB — BASIC METABOLIC PANEL
Anion gap: 8 (ref 5–15)
BUN: 22 mg/dL (ref 8–23)
CO2: 21 mmol/L — ABNORMAL LOW (ref 22–32)
Calcium: 8.6 mg/dL — ABNORMAL LOW (ref 8.9–10.3)
Chloride: 106 mmol/L (ref 98–111)
Creatinine, Ser: 2.19 mg/dL — ABNORMAL HIGH (ref 0.61–1.24)
GFR, Estimated: 28 mL/min — ABNORMAL LOW (ref >60)
Glucose, Bld: 95 mg/dL (ref 70–99)
Potassium: 3.7 mmol/L (ref 3.5–5.1)
Sodium: 135 mmol/L (ref 135–145)

## 2023-03-18 NOTE — Progress Notes (Addendum)
 [facility]                                                                              Brandon Harris, is a 89 y.o. male, DOB - [birth year], FMW:[id number] Admit date - 03/13/2023    Outpatient Primary MD for the patient is Seabron Lenis, MD  LOS - 4  days  Chief Complaint  Patient presents with   Numbness       Brief summary   Patient is a 89 year old male with CAD status post CABG, CKD stage IV, history of prior CVA, hypertension, mesenteric artery stenosis, history of prostate cancer initially presented to the ED 4 days prior to admission with abdominal pain, CT chest abdomen pelvis had findings concerning for chronic proctitis patient discharged on Augmentin . Patient presented back to the ED with complaints of left upper and lower extremity numbness ongoing for 48 hours, MRI brain done concerning for acute right frontal temporal parietal stroke. Patient also noted to be in new onset A-fib started on heparin  drip. Patient admitted for stroke workup. Patient seen in consultation by GI, neurology, cardiology.    Assessment & Plan    Principal Problem:   Acute right frontal CVA (cerebrovascular accident) (HCC) --Likely cardioembolic secondary to new onset atrial fibrillation presented with left upper and left lower extremity numbness. - CT head with no acute intracranial findings, chronic microvascular ischemic changes  -MRI brain showed small acute right frontoparietal infarcts.  Moderate to severe chronic small vessel ischemic disease and numerous chronic infarcts.  Suspected occlusion of distal left vertebral artery. -MRA head: normal intracranial MRA.  -MRA neck:Severely technically degraded MRA of the neck essentially nondiagnostic. -2D echo showed EF of 65 to 70%,NWMA, mild LVH, mildly reduced RVSF, mildly dilated left atrial size, trivial MVR, no atrial level shunt detected by color-flow Doppler. - carotid ultrasound showed bilateral 40 to 59% ICA stenosis which  may be underestimated secondary to bulky plaque and vessel tortuosity.  Right vertebral artery demonstrated antegrade flow.  Left vertebral artery demonstrates an occlusion. - LDL of 35. Hb A1c 5.5. -Initially on IV heparin  for anticoagulation, now transitioned to Eliquis  -Continue statin. -PT recommending CIR placement, awaiting bed. -Neurology was following, but have signed off. -Outpatient follow-up with neurology.    New onset A-fib -Patient noted to be on new onset A-fib on admission and had presented with acute CVA. -CHA2DS2VASC score is 6. -TSH within normal limits. -2D echo: EF of 65 to 70%, mildly reduced RV function, mildly dilated left atrial size,NWMA. -cont coreg  12.5 mg twice daily. -Was on IV heparin  on admission and has been transitioned to Eliquis  2.5 mg twice daily due to age and renal function per cardiology recommendations. -Per cardiology, once stable and after around 3 to 4 weeks may consider DCCV at that time if patient still in A-fib. -Will need outpatient follow-up with cardiology.      Hypertension -BP controlled, continue coreg , Imdur . -Continue to hold hydralazine  and Norvasc .    CAD status post CABG (2001) -stable -Continue Eliquis , statin, beta-blocker, decrease dose of Imdur . -Continue to hold hydralazine  and Norvasc  due to soft BP   History of prostate cancer -  Outpatient follow-up.    Abdominal pain, dysphagia//nausea and vomiting/history of mesenteric artery stenosis/?  Proctitis -seen by GI recommended empiric PPI daily, sucralfate  1 g p.o. 4 times daily with no plans for endoscopic intervention at this time and outpatient follow-up with GI if recurrence of symptoms.    Recent diagnosis of E. coli UTI -Urine cultures from 03/09/2023 resistant to ciprofloxacin  otherwise pansensitive. -Transitioned from IV Rocephin  to Augmentin , day 5.      CKD stage IV -baseline 2.0-2.2 - Cr stable.    Estimated body mass index is 28.32 kg/m as calculated  from the following:   Height as of this encounter: 5' 11 (1.803 m).   Weight as of this encounter: 92.1 kg.  Code Status: full  DVT Prophylaxis:  apixaban  (ELIQUIS ) tablet 2.5 mg Start: 03/15/23 1415 apixaban  (ELIQUIS ) tablet 2.5 mg   Level of Care: Level of care: Telemetry Medical Family Communication: Updated patient Disposition Plan:      Remains inpatient appropriate:  awaiting CIR     Procedures:  CT head 03/13/2023 MRI brain 03/13/2023 MRA head and neck 03/14/2023 Carotid ultrasound 03/15/2023 2D echo 03/14/2023  Consultants:   GI  Neurology Cardiology CIR   Antimicrobials:   Anti-infectives (From admission, onward)    Start     Dose/Rate Route Frequency Ordered Stop   03/16/23 2200  amoxicillin -clavulanate (AUGMENTIN ) 500-125 MG per tablet 1 tablet        1 tablet Oral Every 12 hours 03/16/23 1308 03/24/23 0959   03/16/23 1545  metroNIDAZOLE  (FLAGYL ) tablet 500 mg        500 mg Oral Every 12 hours 03/16/23 1450     03/16/23 1245  metroNIDAZOLE  (FLAGYL ) tablet 500 mg  Status:  Discontinued        500 mg Oral Every 12 hours 03/16/23 1150 03/16/23 1308   03/14/23 0115  metroNIDAZOLE  (FLAGYL ) IVPB 500 mg  Status:  Discontinued        500 mg 100 mL/hr over 60 Minutes Intravenous 2 times daily 03/14/23 0055 03/15/23 1553   03/14/23 0100  cefTRIAXone  (ROCEPHIN ) 2 g in sodium chloride  0.9 % 100 mL IVPB  Status:  Discontinued        2 g 200 mL/hr over 30 Minutes Intravenous Every 24 hours 03/14/23 0055 03/16/23 1308          Medications  amoxicillin -clavulanate  1 tablet Oral Q12H   apixaban   2.5 mg Oral BID   atorvastatin   80 mg Oral QPM   carvedilol   12.5 mg Oral BID WC   ferrous sulfate   325 mg Oral Daily   isosorbide  mononitrate  15 mg Oral Daily   metroNIDAZOLE   500 mg Oral Q12H   mirtazapine   15 mg Oral QHS   pantoprazole   40 mg Oral Daily   sucralfate   1 g Oral TID WC & HS      Subjective:   Brandon Harris was seen and examined today.  No acute issues  overnight, left sided weakness improving. Patient denies dizziness, chest pain, shortness of breath, abdominal pain, N/V. No acute events overnight.    Objective:   Vitals:   03/17/23 2051 03/18/23 0058 03/18/23 0500 03/18/23 0811  BP: (!) 153/74 (!) 163/89 (!) 167/67 139/60  Pulse: 83 88 85 98  Resp:  18  16  Temp: 98.7 F (37.1 C) 98.8 F (37.1 C) 97.9 F (36.6 C) 97.6 F (36.4 C)  TempSrc: Oral Oral Axillary Oral  SpO2: 99% 97% 95% 97%  Weight:  Height:        Intake/Output Summary (Last 24 hours) at 03/18/2023 1021 Last data filed at 03/18/2023 0811 Gross per 24 hour  Intake 1450.51 ml  Output 1100 ml  Net 350.51 ml     Wt Readings from Last 3 Encounters:  03/16/23 92.1 kg  03/09/23 92.1 kg  02/16/23 87.7 kg     Exam General: Alert and oriented x 3, NAD Cardiovascular: S1 S2 auscultated,  RRR Respiratory: Clear to auscultation bilaterally Gastrointestinal: Soft, nontender, nondistended, + bowel sounds Ext: no pedal edema bilaterally Neuro: Strength 5/5 right side.  LUE, LLE 4/5 Psych: Normal affect     Data Reviewed:  I have personally reviewed following labs    CBC Lab Results  Component Value Date   WBC 10.6 (H) 03/18/2023   RBC 3.08 (L) 03/18/2023   HGB 9.3 (L) 03/18/2023   HCT 28.5 (L) 03/18/2023   MCV 92.5 03/18/2023   MCH 30.2 03/18/2023   PLT 289 03/18/2023   MCHC 32.6 03/18/2023   RDW 14.1 03/18/2023   LYMPHSABS 2.0 03/13/2023   MONOABS 0.8 03/13/2023   EOSABS 0.4 03/13/2023   BASOSABS 0.0 03/13/2023     Last metabolic panel Lab Results  Component Value Date   NA 135 03/18/2023   K 3.7 03/18/2023   CL 106 03/18/2023   CO2 21 (L) 03/18/2023   BUN 22 03/18/2023   CREATININE 2.19 (H) 03/18/2023   GLUCOSE 95 03/18/2023   GFRNONAA 28 (L) 03/18/2023   GFRAA 32 (L) 07/14/2019   CALCIUM  8.6 (L) 03/18/2023   PHOS 3.6 03/15/2023   PROT 6.9 03/09/2023   ALBUMIN 3.5 03/15/2023   BILITOT 0.8 03/09/2023   ALKPHOS 65 03/09/2023   AST  22 03/09/2023   ALT 22 03/09/2023   ANIONGAP 8 03/18/2023    CBG (last 3)  No results for input(s): GLUCAP in the last 72 hours.    Coagulation Profile: No results for input(s): INR, PROTIME in the last 168 hours.   Radiology Studies: I have personally reviewed the imaging studies  No results found.     Nydia Distance M.D. [facility] 03/18/2023, 10:21 AM  Available via Epic secure chat 7am-7pm After 7 pm, please refer to night coverage provider listed on amion.

## 2023-03-18 NOTE — Progress Notes (Signed)
 Physical Therapy Treatment Patient Details Name: Brandon Harris MRN: [id number] DOB: 05-Aug-1928 Today's Date: 03/18/2023   History of Present Illness Pt is a 89 y/o M admitted on 03/13/23 after presenting with c/o LUE & LLE numbness x 48 hours. MRI showed acute R frontotemporoparietal stroke. Pt also with new onset of a-fib rate controlled. Of note, pt was seen in the ED 4 days prior with c/o abdominal pain; CT concerning for proctitis & pt was d/c home on Augmentin . PMH: CAD s/p CABG, HTN, CKD IV, stroke, mesenteric artery stenosis, prostate CA, MI, urinary incontinence and B rotator cuff repairs    PT Comments  Pt resting in bed on arrival and agreeable to session with steady progress towards acute goals. Pt continues to be limited by L hemi-weakness, decreased activity tolerance and impaired balance/postural reactions. Pt requiring increased assist for bed mobility and transfers this session as pt reporting BLE soreness and with increased difficulty initiating and sequencing mobility. Pt requiring min A to come to sitting EOB and Ronen A fading to mod A to boost to stand with cues for hand placement and RW for support. Pt progressing gait distance with min A to steady and manage RW with cues for LLE amplitude and clearance as pt with tendency for short shuffling steps leaving LLE behind. Current plan remains appropriate to address deficits and maximize functional independence and decrease caregiver burden. Pt continues to benefit from skilled PT services to progress toward functional mobility goals.      If plan is discharge home, recommend the following: A lot of help with walking and/or transfers;A lot of help with bathing/dressing/bathroom;Assist for transportation;Assistance with feeding;Assistance with cooking/housework;Help with stairs or ramp for entrance   Can travel by private vehicle     No  Equipment Recommendations  Other (comment) (defer to next venue)    Recommendations for Other  Services       Precautions / Restrictions Precautions Precautions: Fall Restrictions Weight Bearing Restrictions Per Provider Order: No     Mobility  Bed Mobility Overal bed mobility: Needs Assistance Bed Mobility: Supine to Sit, Sit to Supine     Supine to sit: HOB elevated, Used rails, Min assist Sit to supine: Contact guard assist   General bed mobility comments: min A to initiate LLE to and off EOB and to elevate trunk, able to return to supine without physical assist    Transfers Overall transfer level: Needs assistance Equipment used: Rolling walker (2 wheels) Transfers: Sit to/from Stand Sit to Stand: Mod assist, Hafiz assist           General transfer comment: Arvell A on initial stand, mod A to stand x2 at end of session, poor initiation during all attempts and assist needed to boost    Ambulation/Gait Ambulation/Gait assistance: Min assist Gait Distance (Feet): 78 Feet Assistive device: Rolling walker (2 wheels) Gait Pattern/deviations: Decreased step length - right, Decreased step length - left, Decreased stride length, Decreased dorsiflexion - right, Decreased dorsiflexion - left Gait velocity: decreased     General Gait Details: Pt pushing RW out in front of him. and with difficulty advancing LLE at times, cues for upright trunk and bigger steps on L with pt able to briefly correct but unable to maintain   Stairs             Wheelchair Mobility     Tilt Bed    Modified Rankin (Stroke Patients Only)       Balance Overall balance assessment: Needs assistance  Sitting-balance support: Feet supported, No upper extremity supported Sitting balance-Leahy Scale: Fair Sitting balance - Comments: Able to reach to feet in chair with no difficulty - supervision for edge of chair sitting   Standing balance support: Bilateral upper extremity supported, Reliant on assistive device for balance Standing balance-Leahy Scale: Fair Standing balance comment:  reliant on UE support                            Cognition Arousal: Alert Behavior During Therapy: WFL for tasks assessed/performed Overall Cognitive Status: Within Functional Limits for tasks assessed                                          Exercises General Exercises - Lower Extremity Hip Flexion/Marching: AROM, Right, Left, 20 reps, Seated    General Comments        Pertinent Vitals/Pain Pain Assessment Pain Assessment: Faces Faces Pain Scale: Hurts a little bit Pain Location: BLEs Pain Descriptors / Indicators: Sore Pain Intervention(s): Monitored during session, Limited activity within patient's tolerance    Home Living                          Prior Function            PT Goals (current goals can now be found in the care plan section) Acute Rehab PT Goals Patient Stated Goal: get better PT Goal Formulation: With patient Time For Goal Achievement: 03/29/23 Progress towards PT goals: Progressing toward goals    Frequency    Min 1X/week      PT Plan      Co-evaluation              AM-PAC PT 6 Clicks Mobility   Outcome Measure  Help needed turning from your back to your side while in a flat bed without using bedrails?: A Lot Help needed moving from lying on your back to sitting on the side of a flat bed without using bedrails?: A Lot Help needed moving to and from a bed to a chair (including a wheelchair)?: A Little Help needed standing up from a chair using your arms (e.g., wheelchair or bedside chair)?: A Lot Help needed to walk in hospital room?: A Little Help needed climbing 3-5 steps with a railing? : A Lot 6 Click Score: 14    End of Session Equipment Utilized During Treatment: Gait belt Activity Tolerance: Patient tolerated treatment well;Patient limited by fatigue Patient left: with call bell/phone within reach;in bed;with family/visitor present Nurse Communication: Mobility status PT Visit  Diagnosis: Unsteadiness on feet (R26.81);Other abnormalities of gait and mobility (R26.89);Difficulty in walking, not elsewhere classified (R26.2);Muscle weakness (generalized) (M62.81);Hemiplegia and hemiparesis Hemiplegia - Right/Left: Left Hemiplegia - dominant/non-dominant: Non-dominant Hemiplegia - caused by: Other cerebrovascular disease     Time: 0850-0908 PT Time Calculation (min) (ACUTE ONLY): 18 min  Charges:    $Gait Training: 8-22 mins PT General Charges $$ ACUTE PT VISIT: 1 Visit                     Clebert Wenger R. PTA Acute Rehabilitation Services Office: [phone]   Therisa CHRISTELLA Boor 03/18/2023, 9:42 AM

## 2023-03-18 NOTE — Progress Notes (Signed)
 Inpatient Rehab Admissions Coordinator:   I received insurance approval for CIR.  I do not have a bed available for this patient to admit to CIR today.  We will continue to follow for potential admit pending bed availability.   Reche Lowers, PT, DPT Admissions Coordinator [phone] 03/18/23  9:45 AM

## 2023-03-18 NOTE — H&P (Signed)
 Physical Medicine and Rehabilitation Admission H&P    Chief Complaint  Patient presents with   Numbness  : HPI: Brandon Harris is a 89 year old right-handed male with history significant for CAD/CABG 2001 maintained on low-dose aspirin , CKD stage IV, history of prior CVA, hypertension, mesenteric artery stenosis followed by [facility], history of prostate cancer, right total knee arthroplasty 2016, back surgery x 4.  Per chart review patient lives with his daughter.  1 level home with a ramped entrance.  Ambulatory with a rollator.  He still drives to church.  Patient recently came to the ER 4 days ago because of abdominal pain at that time CT chest abdomen pelvis showed circumferential thickening at the anorectal junction distention versus proctitis, diverticulosis, right upper quadrant.  He was discharged to home on Augmentin  per GI services..  Presented 03/13/2023 with persistent left upper and lower extremity numbness and weakness.  Admission chemistries unremarkable except sodium 133, glucose 117 creatinine 2.26, hemoglobin 9.7, lactic acid unremarkable, urinalysis negative nitrite.  CT/MRI showed small acute right frontal parietal infarcts.  Moderate to severe chronic small vessel ischemic disease with numerous chronic infarcts.  MRA of the head unremarkable.  MRA of the neck showed severely technically degraded MRA essentially nondiagnostic.  Patient did not receive tPA.  He was found to be in A-fib with RVR and cardiology services consulted.  Placed on IV heparin  transition to Eliquis .  Echocardiogram ejection fraction of 65 to 70%.  No wall motion abnormalities.  Plans to follow-up outpatient considering DCCV if needed.  Tolerating a regular consistency diet.  Therapy evaluations completed due to patient decreased functional ability left side weakness was admitted for a comprehensive rehab program.  Review of Systems  Constitutional:  Negative for chills and fever.  HENT:  Negative for hearing loss.    Eyes:  Negative for blurred vision and double vision.  Respiratory:  Negative for cough, shortness of breath and wheezing.   Cardiovascular:  Negative for chest pain and palpitations.  Gastrointestinal:  Positive for constipation, nausea and vomiting. Negative for heartburn.       GERD  Genitourinary:  Positive for urgency. Negative for dysuria, flank pain and hematuria.       Urinary stress incontinence  Musculoskeletal:  Positive for back pain, joint pain and myalgias.  Skin:  Negative for rash.  Neurological:  Positive for sensory change and focal weakness.  Psychiatric/Behavioral:  Positive for depression.        Anxiety  All other systems reviewed and are negative.  Past Medical History:  Diagnosis Date   Arthritis    Carotid arterial disease (HCC)    Chronic kidney disease (CKD), stage IV (severe) (HCC)    Diverticulosis    colonic diverticulosis   Gallstones    GERD (gastroesophageal reflux disease)    Hypertension    Myocardial infarction [facility])    Prostate cancer (HCC)    Status post coronary artery bypass grafting     intraoperative cholangiogram   Stroke [facility])    patient reports no deficits    Urinary incontinence    bladder leakage -  patient wears a pad   Past Surgical History:  Procedure Laterality Date   BACK SURGERY     x 4, has titanium in back per patient    CHOLECYSTECTOMY, LAPAROSCOPIC     CORONARY ARTERY BYPASS GRAFT  2001   CRYOTHERAPY     Prostate Cancer   LEFT HEART CATH AND CORS/GRAFTS ANGIOGRAPHY N/A 10/27/2017   Procedure: LEFT  HEART CATH AND CORS/GRAFTS ANGIOGRAPHY;  Surgeon: Claudene Victory ORN, MD;  Location: [facility] INVASIVE CV LAB;  Service: Cardiovascular;  Laterality: N/A;   prostate cancer     ROTATOR CUFF REPAIR     LEFT   TOTAL KNEE ARTHROPLASTY Right 12/18/2014   Procedure: TOTAL RIGHT KNEE ARTHROPLASTY;  Surgeon: Donnice Car, MD;  Location: [facility] ORS;  Service: Orthopedics;  Laterality: Right;   Family History  Problem Relation Age of  Onset   Heart failure Mother    Social History:  reports that he has never smoked. He has never used smokeless tobacco. He reports that he does not drink alcohol and does not use drugs. Allergies:  Allergies  Allergen Reactions   Amlodipine  Besylate     Other reaction(s): 10 mg leads to swelling; tolerates 5 mg   Mirabegron      Other reaction(s): Unknown   Vibegron     Other reaction(s): mental status changes   Medications Prior to Admission  Medication Sig Dispense Refill   ALPRAZolam  (XANAX ) 0.25 MG tablet Take 0.25 mg by mouth at bedtime as needed for anxiety or sleep. TAKE ONE TABLET AT NIGHT     amitriptyline (ELAVIL) 10 MG tablet Take 20 mg by mouth at bedtime.  0   amLODipine  (NORVASC ) 10 MG tablet Take 1 tablet (10 mg total) by mouth every morning. 90 tablet 0   amoxicillin -clavulanate (AUGMENTIN ) 875-125 MG tablet Take 1 tablet by mouth every 12 (twelve) hours. 14 tablet 0   atorvastatin  (LIPITOR ) 80 MG tablet Take 80 mg by mouth every evening.      CALCIUM  PO Take 1 tablet by mouth daily.     carvedilol  (COREG ) 12.5 MG tablet Take one tablet by mouth every morning and take one tablet by mouth everyday at bedtime 180 tablet 0   Ferrous Sulfate  (IRON PO) Take 1 tablet by mouth daily.     furosemide  (LASIX ) 20 MG tablet TAKE 1 TABLET BY MOUTH WEEKLY ON MONDAY, WEDNESDAY & FRIDAY     hydrALAZINE  (APRESOLINE ) 25 MG tablet Take 1 tablet (25 mg total) by mouth 3 (three) times daily. To be taken with (50 mg) tablet three times daily. 270 tablet 0   hydrALAZINE  (APRESOLINE ) 50 MG tablet Take one tablet by mouth every morning and take one tablet by mouth at noon and take one tablet by mouth everyday at bedtime. 270 tablet 0   isosorbide  mononitrate (IMDUR ) 60 MG 24 hr tablet Take 1 tablet (60 mg total) by mouth every morning. 90 tablet 0   Melatonin 10 MG TABS Take 10 mg by mouth daily.     mirtazapine  (REMERON ) 15 MG tablet Take 15 mg by mouth at bedtime.     nitroGLYCERIN  (NITROSTAT )  0.4 MG SL tablet Dissolve 1 tab under tongue as needed for chest pain. May repeat every 5 minutes x 2 doses. If no relief call 9-1-1. 25 tablet 11   ondansetron  (ZOFRAN ) 4 MG tablet Take 1 tablet (4 mg total) by mouth every 8 (eight) hours as needed for nausea or vomiting. 15 tablet 0   pantoprazole  (PROTONIX ) 40 MG tablet TAKE ONE TABLET BY MOUTH EVERY MORNING 90 tablet 0   zolpidem  (AMBIEN ) 5 MG tablet Take 5 mg by mouth at bedtime.     aspirin  EC 81 MG tablet Take 1 tablet (81 mg total) by mouth daily. (Patient not taking: Reported on 02/16/2023) 90 tablet 3   tamsulosin  (FLOMAX ) 0.4 MG CAPS capsule Take 0.8 mg by mouth daily. Take two (2)  capsules (0.8 mg) by mouth daily.  (Patient not taking: Reported on 02/16/2023)  11   traMADol  (ULTRAM ) 50 MG tablet Take 50 mg by mouth every 6 (six) hours as needed for moderate pain or severe pain. (Patient not taking: Reported on 02/16/2023)        Home: Home Living Family/patient expects to be discharged to:: Private residence Living Arrangements: Children Available Help at Discharge: Family, Available 24 hours/day (Simultaneous filing. User may not have seen previous data.) Type of Home: House (Simultaneous filing. User may not have seen previous data.) Home Access: Stairs to enter, Ramped entrance Entrance Stairs-Number of Steps: 3 in garage Entrance Stairs-Rails: Right Home Layout: One level Bathroom Shower/Tub: Tub/shower unit Home Equipment: [employer] (2 wheels), Rollator (4 wheels), Cane - single point Additional Comments: Patient reports he prefers to take tub baths and has grab bars he uses to get in and out of the tub.   Functional History: Prior Function Prior Level of Function : Driving, Needs assist Physical Assist : ADLs (physical) ADLs (physical): IADLs, Dressing Mobility Comments: Pt reports he needs PRN assist for bed mobility, transfers, gait. Pt reports he's ambulatory with rollator, denies falls, still drives to church. ADLs  Comments: Patient needs occsional assist with donning/ doffing jackets or long sleeve shirts  Functional Status:  Mobility: Bed Mobility Overal bed mobility: Needs Assistance Bed Mobility: Supine to Sit, Sit to Supine Supine to sit: HOB elevated, Used rails, Min assist Sit to supine: Contact guard assist General bed mobility comments: min A to initiate LLE to and off EOB and to elevate trunk, able to return to supine without physical assist Transfers Overall transfer level: Needs assistance Equipment used: Rolling walker (2 wheels) Transfers: Sit to/from Stand Sit to Stand: Mod assist, Akia assist General transfer comment: Densil A on initial stand, mod A to stand x2 at end of session, poor initiation during all attempts and assist needed to boost Ambulation/Gait Ambulation/Gait assistance: Min assist Gait Distance (Feet): 78 Feet Assistive device: Rolling walker (2 wheels) Gait Pattern/deviations: Decreased step length - right, Decreased step length - left, Decreased stride length, Decreased dorsiflexion - right, Decreased dorsiflexion - left General Gait Details: Pt pushing RW out in front of him. and with difficulty advancing LLE at times, cues for upright trunk and bigger steps on L with pt able to briefly correct but unable to maintain Gait velocity: decreased    ADL: ADL Overall ADL's : Needs assistance/impaired Eating/Feeding: Set up Eating/Feeding Details (indicate cue type and reason): Positioning will need to be upright due to lack of shoulder ROM Grooming: Minimal assistance Upper Body Bathing: Minimal assistance Lower Body Bathing: Moderate assistance Upper Body Dressing : Moderate assistance Lower Body Dressing: Moderate assistance Toilet Transfer: Moderate assistance Toileting- Clothing Manipulation and Hygiene: Moderate assistance Functional mobility during ADLs: Moderate assistance General ADL Comments: Patient needing cueing for safe use of walker, noted ataxic  movements in LLE with stepping forward or backward with a tendency to flex at the hips with backward stepping. Also noted hyperextension of the LLE.  Patient SOB with standing <5 minutes.  Patient was able to release BUE in standing and / or use BUE for support  Cognition: Cognition Overall Cognitive Status: Within Functional Limits for tasks assessed Arousal/Alertness: Awake/alert Orientation Level: Oriented X4 Attention: Selective Selective Attention: Appears intact Memory: Impaired Memory Impairment: Decreased recall of new information Executive Function: Self Correcting Self Correcting: Impaired Cognition Arousal: Alert Behavior During Therapy: WFL for tasks assessed/performed Overall Cognitive Status: Within Functional  Limits for tasks assessed General Comments: AxOx4  Physical Exam: Blood pressure (!) 150/72, pulse 81, temperature 97.6 F (36.4 C), temperature source Oral, resp. rate 18, height 5' 11 (1.803 m), weight 92.1 kg, SpO2 99%. Physical Exam Constitutional:      General: He is not in acute distress. HENT:     Head: Normocephalic.     Right Ear: External ear normal.     Left Ear: External ear normal.     Nose: Nose normal.     Mouth/Throat:     Mouth: Mucous membranes are moist.     Pharynx: Oropharynx is clear.  Eyes:     Extraocular Movements: Extraocular movements intact.     Conjunctiva/sclera: Conjunctivae normal.     Pupils: Pupils are equal, round, and reactive to light.  Cardiovascular:     Rate and Rhythm: Normal rate. Rhythm irregular.     Heart sounds: Murmur heard.  Pulmonary:     Effort: Pulmonary effort is normal. No respiratory distress.     Breath sounds: No wheezing.  Abdominal:     General: Bowel sounds are normal. There is no distension.     Tenderness: There is no abdominal tenderness.  Musculoskeletal:        General: Swelling (slight at left ankle) and tenderness (Right chest wall and left ankle) present.     Cervical back: Normal  range of motion.     Comments: Both shoulders with limited movement d/t RTC injuries  Skin:    General: Skin is warm and dry.     Findings: Bruising present.  Neurological:     Mental Status: He is alert.     Comments: Alert and oriented to person, place, month/year and reason he's here. Normal insight and awareness. Intact Memory. Normal language with dysarthric speech. Cranial nerve exam unremarkable except for left central 7. Mild left inattention. MMT: Both shoulders limited d/t pain/chronic RTC injuries. RUE 4 to 4+/5 prox to distal. LUE 3+ to 4- bicep, triceps, wrist and hand. LLE 3/5 HF, KE and 3+ with ankle DF/PF (pain), RLE 4 to 4+/5 prox to distal. Sensory exam normal for light touch and pain on right, sl decrease on left arm and leg. No limb ataxia or cerebellar signs. No abnormal tone appreciated.  SABRA        Psychiatric:        Mood and Affect: Mood normal.        Behavior: Behavior normal.     Results for orders placed or performed during the hospital encounter of 03/13/23 (from the past 48 hours)  CBC     Status: Abnormal   Collection Time: 03/17/23  6:14 AM  Result Value Ref Range   WBC 9.3 4.0 - 10.5 K/uL   RBC 3.10 (L) 4.22 - 5.81 MIL/uL   Hemoglobin 9.3 (L) 13.0 - 17.0 g/dL   HCT 71.2 (L) 60.9 - 47.9 %   MCV 92.6 80.0 - 100.0 fL   MCH 30.0 26.0 - 34.0 pg   MCHC 32.4 30.0 - 36.0 g/dL   RDW 85.8 88.4 - 84.4 %   Platelets 294 150 - 400 K/uL   nRBC 0.0 0.0 - 0.2 %    Comment: Performed at [facility], [address] [place], [place] [address]  Basic metabolic panel     Status: Abnormal   Collection Time: 03/17/23  6:14 AM  Result Value Ref Range   Sodium 137 135 - 145 mmol/L   Potassium 4.2 3.5 -  5.1 mmol/L   Chloride 104 98 - 111 mmol/L   CO2 23 22 - 32 mmol/L   Glucose, Bld 91 70 - 99 mg/dL    Comment: Glucose reference range applies only to samples taken after fasting for at least 8 hours.   BUN 18 8 - 23 mg/dL   Creatinine, Ser 7.93 (H) 0.61 - 1.24  mg/dL   Calcium  8.7 (L) 8.9 - 10.3 mg/dL   GFR, Estimated 30 (L) >60 mL/min    Comment: (NOTE) Calculated using the CKD-EPI Creatinine Equation (2021)    Anion gap 10 5 - 15    Comment: Performed at [facility], [address] [place], [place] [address]  CBC     Status: Abnormal   Collection Time: 03/18/23  6:10 AM  Result Value Ref Range   WBC 10.6 (H) 4.0 - 10.5 K/uL   RBC 3.08 (L) 4.22 - 5.81 MIL/uL   Hemoglobin 9.3 (L) 13.0 - 17.0 g/dL   HCT 71.4 (L) 60.9 - 47.9 %   MCV 92.5 80.0 - 100.0 fL   MCH 30.2 26.0 - 34.0 pg   MCHC 32.6 30.0 - 36.0 g/dL   RDW 85.8 88.4 - 84.4 %   Platelets 289 150 - 400 K/uL   nRBC 0.0 0.0 - 0.2 %    Comment: Performed at [facility], [address], [place], [place] [address]  Basic metabolic panel     Status: Abnormal   Collection Time: 03/18/23  6:10 AM  Result Value Ref Range   Sodium 135 135 - 145 mmol/L   Potassium 3.7 3.5 - 5.1 mmol/L   Chloride 106 98 - 111 mmol/L   CO2 21 (L) 22 - 32 mmol/L   Glucose, Bld 95 70 - 99 mg/dL    Comment: Glucose reference range applies only to samples taken after fasting for at least 8 hours.   BUN 22 8 - 23 mg/dL   Creatinine, Ser 7.80 (H) 0.61 - 1.24 mg/dL   Calcium  8.6 (L) 8.9 - 10.3 mg/dL   GFR, Estimated 28 (L) >60 mL/min    Comment: (NOTE) Calculated using the CKD-EPI Creatinine Equation (2021)    Anion gap 8 5 - 15    Comment: Performed at [facility], [address], [place], [place] [address]   No results found.    Blood pressure (!) 150/72, pulse 81, temperature 97.6 F (36.4 C), temperature source Oral, resp. rate 18, height 5' 11 (1.803 m), weight 92.1 kg, SpO2 99%.  Medical Problem List and Plan: 1. Functional deficits secondary to right MCA cortical several small infarcts likely secondary to newly diagnosed A-fib RVR  -patient may  shower  -ELOS/Goals: 11-15 days, supervision to mod I goals with PT, OT, SLP 2.  Antithrombotics: -DVT/anticoagulation:   Pharmaceutical: Eliquis   -antiplatelet therapy: N/A 3. Pain Management:   -?prostate mets in right 4th ribs. Pt c/o pain in this area  -now with pain and sl swelling around left ankle without known injury  -will check xray of left ankle  -try voltaren  gel to right chest wall and ankle TID.   -observe for activity tolerance 4. Mood/Behavior/Sleep: Remeron  15 mg nightly.  Xanax  0.25 mg nightly as needed  -antipsychotic agents: N/A 5. Neuropsych/cognition: This patient is capable of making decisions on his own behalf. 6. Skin/Wound Care: Routine skin checks 7. Fluids/Electrolytes/Nutrition: Routine and analysis follow-up chemistries 8.  Newly diagnosed A-fib with RVR.  Continue Eliquis  as well as Coreg  12.5  mg twice daily.  Plan outpatient follow-up consider DCCV 9.  CAD/CABG.  Imdur  15 mg daily. 10.  Hypertension.  Monitor with increased mobility.  Hydralazine  and Norvasc  currently on hold due to soft blood pressure and resume as needed 11.  History of prostate cancer with recent E. coli UTI/prostatitis.  Complete course of Augmentin  12.  CKD stage IV.  Baseline creatinine 2.0-2.2.  Follow-up chemistries on Monday 13.  GERD/abdominal pain/nausea and vomiting.  Seen by GI recommended empiric PPI daily, Carafate /Flagyl .  No plans for endoscopic intervention at this time  -no recorded emesis since I saw him in consult 14.  Hyperlipidemia.  Lipitor  15.  History of mesenteric artery stenosis.  Follow-up outpatient 16.  Anemia of chronic disease.  Continue iron supplement.  Occult blood negative.  Follow-up CBC  Toribio JINNY Pitch, PA-C 03/19/2023

## 2023-03-19 ENCOUNTER — Inpatient Hospital Stay (HOSPITAL_COMMUNITY): Payer: Medicare Other

## 2023-03-19 ENCOUNTER — Inpatient Hospital Stay (HOSPITAL_COMMUNITY)
Admission: AD | Admit: 2023-03-19 | Discharge: 2023-03-30 | DRG: 057 | Disposition: A | Discharge status: Home Health | Payer: Medicare Other | Source: Intra-hospital | Attending: Physical Medicine & Rehabilitation | Admitting: Physical Medicine & Rehabilitation

## 2023-03-19 ENCOUNTER — Encounter (HOSPITAL_COMMUNITY): Payer: Self-pay | Admitting: Physical Medicine & Rehabilitation

## 2023-03-19 ENCOUNTER — Other Ambulatory Visit: Payer: Self-pay

## 2023-03-19 DIAGNOSIS — Z96651 Presence of right artificial knee joint: Secondary | ICD-10-CM | POA: Diagnosis present

## 2023-03-19 DIAGNOSIS — C61 Malignant neoplasm of prostate: Secondary | ICD-10-CM

## 2023-03-19 DIAGNOSIS — I63411 Cerebral infarction due to embolism of right middle cerebral artery: Secondary | ICD-10-CM | POA: Diagnosis not present

## 2023-03-19 DIAGNOSIS — I10 Essential (primary) hypertension: Secondary | ICD-10-CM

## 2023-03-19 DIAGNOSIS — I2510 Atherosclerotic heart disease of native coronary artery without angina pectoris: Secondary | ICD-10-CM | POA: Diagnosis present

## 2023-03-19 DIAGNOSIS — E785 Hyperlipidemia, unspecified: Secondary | ICD-10-CM | POA: Diagnosis not present

## 2023-03-19 DIAGNOSIS — I129 Hypertensive chronic kidney disease with stage 1 through stage 4 chronic kidney disease, or unspecified chronic kidney disease: Secondary | ICD-10-CM | POA: Diagnosis present

## 2023-03-19 DIAGNOSIS — M25562 Pain in left knee: Secondary | ICD-10-CM | POA: Diagnosis not present

## 2023-03-19 DIAGNOSIS — I252 Old myocardial infarction: Secondary | ICD-10-CM

## 2023-03-19 DIAGNOSIS — R112 Nausea with vomiting, unspecified: Secondary | ICD-10-CM | POA: Diagnosis not present

## 2023-03-19 DIAGNOSIS — Z7901 Long term (current) use of anticoagulants: Secondary | ICD-10-CM | POA: Diagnosis not present

## 2023-03-19 DIAGNOSIS — D631 Anemia in chronic kidney disease: Secondary | ICD-10-CM | POA: Diagnosis present

## 2023-03-19 DIAGNOSIS — I639 Cerebral infarction, unspecified: Secondary | ICD-10-CM | POA: Diagnosis present

## 2023-03-19 DIAGNOSIS — K551 Chronic vascular disorders of intestine: Secondary | ICD-10-CM | POA: Diagnosis present

## 2023-03-19 DIAGNOSIS — E871 Hypo-osmolality and hyponatremia: Secondary | ICD-10-CM | POA: Diagnosis present

## 2023-03-19 DIAGNOSIS — M25472 Effusion, left ankle: Secondary | ICD-10-CM | POA: Diagnosis present

## 2023-03-19 DIAGNOSIS — I4891 Unspecified atrial fibrillation: Secondary | ICD-10-CM | POA: Diagnosis present

## 2023-03-19 DIAGNOSIS — I951 Orthostatic hypotension: Secondary | ICD-10-CM | POA: Diagnosis present

## 2023-03-19 DIAGNOSIS — Z96641 Presence of right artificial hip joint: Secondary | ICD-10-CM | POA: Diagnosis present

## 2023-03-19 DIAGNOSIS — K5900 Constipation, unspecified: Secondary | ICD-10-CM | POA: Diagnosis not present

## 2023-03-19 DIAGNOSIS — Z951 Presence of aortocoronary bypass graft: Secondary | ICD-10-CM | POA: Diagnosis not present

## 2023-03-19 DIAGNOSIS — Z8546 Personal history of malignant neoplasm of prostate: Secondary | ICD-10-CM | POA: Diagnosis not present

## 2023-03-19 DIAGNOSIS — N3946 Mixed incontinence: Secondary | ICD-10-CM | POA: Diagnosis not present

## 2023-03-19 DIAGNOSIS — Z8249 Family history of ischemic heart disease and other diseases of the circulatory system: Secondary | ICD-10-CM | POA: Diagnosis not present

## 2023-03-19 DIAGNOSIS — Z888 Allergy status to other drugs, medicaments and biological substances status: Secondary | ICD-10-CM

## 2023-03-19 DIAGNOSIS — M25561 Pain in right knee: Secondary | ICD-10-CM | POA: Diagnosis not present

## 2023-03-19 DIAGNOSIS — K219 Gastro-esophageal reflux disease without esophagitis: Secondary | ICD-10-CM | POA: Diagnosis present

## 2023-03-19 DIAGNOSIS — Z79899 Other long term (current) drug therapy: Secondary | ICD-10-CM | POA: Diagnosis not present

## 2023-03-19 DIAGNOSIS — R471 Dysarthria and anarthria: Secondary | ICD-10-CM | POA: Diagnosis not present

## 2023-03-19 DIAGNOSIS — I63511 Cerebral infarction due to unspecified occlusion or stenosis of right middle cerebral artery: Principal | ICD-10-CM

## 2023-03-19 DIAGNOSIS — N184 Chronic kidney disease, stage 4 (severe): Secondary | ICD-10-CM | POA: Diagnosis present

## 2023-03-19 DIAGNOSIS — I69354 Hemiplegia and hemiparesis following cerebral infarction affecting left non-dominant side: Secondary | ICD-10-CM | POA: Diagnosis not present

## 2023-03-19 DIAGNOSIS — M19072 Primary osteoarthritis, left ankle and foot: Secondary | ICD-10-CM | POA: Diagnosis not present

## 2023-03-19 DIAGNOSIS — R159 Full incontinence of feces: Secondary | ICD-10-CM | POA: Diagnosis not present

## 2023-03-19 LAB — BASIC METABOLIC PANEL
Anion gap: 10 (ref 5–15)
BUN: 24 mg/dL — ABNORMAL HIGH (ref 8–23)
CO2: 20 mmol/L — ABNORMAL LOW (ref 22–32)
Calcium: 8.4 mg/dL — ABNORMAL LOW (ref 8.9–10.3)
Chloride: 105 mmol/L (ref 98–111)
Creatinine, Ser: 2.05 mg/dL — ABNORMAL HIGH (ref 0.61–1.24)
GFR, Estimated: 30 mL/min — ABNORMAL LOW (ref >60)
Glucose, Bld: 92 mg/dL (ref 70–99)
Potassium: 3.8 mmol/L (ref 3.5–5.1)
Sodium: 135 mmol/L (ref 135–145)

## 2023-03-19 LAB — CBC
HCT: 28.7 % — ABNORMAL LOW (ref 39.0–52.0)
Hemoglobin: 9.5 g/dL — ABNORMAL LOW (ref 13.0–17.0)
MCH: 30.4 pg (ref 26.0–34.0)
MCHC: 33.1 g/dL (ref 30.0–36.0)
MCV: 91.7 fL (ref 80.0–100.0)
Platelets: 293 10*3/uL (ref 150–400)
RBC: 3.13 MIL/uL — ABNORMAL LOW (ref 4.22–5.81)
RDW: 14.3 % (ref 11.5–15.5)
WBC: 11.5 10*3/uL — ABNORMAL HIGH (ref 4.0–10.5)
nRBC: 0 % (ref 0.0–0.2)

## 2023-03-19 MED ORDER — APIXABAN 2.5 MG PO TABS
2.5000 mg | ORAL_TABLET | Freq: Two times a day (BID) | ORAL | Status: DC
  Administered 2023-03-19 – 2023-03-30 (×22): 2.5 mg via ORAL
  Filled 2023-03-19 (×22): qty 1

## 2023-03-19 MED ORDER — AMOXICILLIN-POT CLAVULANATE 500-125 MG PO TABS: 1.0000 | ORAL_TABLET | Freq: Two times a day (BID) | ORAL | 0 refills | Status: DC

## 2023-03-19 MED ORDER — PANTOPRAZOLE SODIUM 40 MG PO TBEC
40.0000 mg | DELAYED_RELEASE_TABLET | Freq: Every day | ORAL | Status: DC
  Administered 2023-03-20 – 2023-03-30 (×11): 40 mg via ORAL
  Filled 2023-03-19 (×11): qty 1

## 2023-03-19 MED ORDER — ACETAMINOPHEN 325 MG PO TABS
650.0000 mg | ORAL_TABLET | ORAL | Status: DC | PRN
  Administered 2023-03-21 – 2023-03-24 (×6): 650 mg via ORAL
  Filled 2023-03-19 (×6): qty 2

## 2023-03-19 MED ORDER — ACETAMINOPHEN 160 MG/5ML PO SOLN: 650.0000 mg | ORAL | Status: DC | PRN

## 2023-03-19 MED ORDER — APIXABAN 2.5 MG PO TABS: 2.5000 mg | ORAL_TABLET | Freq: Two times a day (BID) | ORAL | 0 refills | Status: DC

## 2023-03-19 MED ORDER — ALPRAZOLAM 0.25 MG PO TABS: 0.2500 mg | ORAL_TABLET | Freq: Every evening | ORAL | Status: DC | PRN

## 2023-03-19 MED ORDER — METRONIDAZOLE 500 MG PO TABS
500.0000 mg | ORAL_TABLET | Freq: Two times a day (BID) | ORAL | Status: DC
  Administered 2023-03-19 – 2023-03-20 (×2): 500 mg via ORAL
  Filled 2023-03-19 (×2): qty 1

## 2023-03-19 MED ORDER — MIRTAZAPINE 15 MG PO TABS
15.0000 mg | ORAL_TABLET | Freq: Every day | ORAL | Status: DC
  Administered 2023-03-19 – 2023-03-29 (×11): 15 mg via ORAL
  Filled 2023-03-19 (×11): qty 1

## 2023-03-19 MED ORDER — SUCRALFATE 1 GM/10ML PO SUSP: 1.0000 g | Freq: Three times a day (TID) | ORAL | 0 refills | Status: DC

## 2023-03-19 MED ORDER — AMOXICILLIN-POT CLAVULANATE 500-125 MG PO TABS
1.0000 | ORAL_TABLET | Freq: Two times a day (BID) | ORAL | Status: AC
  Administered 2023-03-19 – 2023-03-23 (×9): 1 via ORAL
  Filled 2023-03-19 (×9): qty 1

## 2023-03-19 MED ORDER — CARVEDILOL 12.5 MG PO TABS
12.5000 mg | ORAL_TABLET | Freq: Two times a day (BID) | ORAL | Status: DC
  Administered 2023-03-19 – 2023-03-22 (×6): 12.5 mg via ORAL
  Filled 2023-03-19 (×6): qty 1

## 2023-03-19 MED ORDER — DICLOFENAC SODIUM 1 % EX GEL
2.0000 g | Freq: Three times a day (TID) | CUTANEOUS | Status: DC
  Administered 2023-03-19 – 2023-03-22 (×5): 2 g via TOPICAL
  Filled 2023-03-19: qty 100

## 2023-03-19 MED ORDER — SUCRALFATE 1 GM/10ML PO SUSP
1.0000 g | Freq: Three times a day (TID) | ORAL | Status: DC
  Administered 2023-03-19 – 2023-03-30 (×42): 1 g via ORAL
  Filled 2023-03-19 (×43): qty 10

## 2023-03-19 MED ORDER — ISOSORBIDE MONONITRATE ER 30 MG PO TB24: 15.0000 mg | ORAL_TABLET | Freq: Every day | ORAL | 0 refills | Status: DC

## 2023-03-19 MED ORDER — ATORVASTATIN CALCIUM 80 MG PO TABS
80.0000 mg | ORAL_TABLET | Freq: Every evening | ORAL | Status: DC
  Administered 2023-03-19 – 2023-03-29 (×11): 80 mg via ORAL
  Filled 2023-03-19 (×11): qty 1

## 2023-03-19 MED ORDER — FERROUS SULFATE 325 (65 FE) MG PO TABS
325.0000 mg | ORAL_TABLET | Freq: Every day | ORAL | Status: DC
  Administered 2023-03-20 – 2023-03-28 (×9): 325 mg via ORAL
  Filled 2023-03-19 (×9): qty 1

## 2023-03-19 MED ORDER — ISOSORBIDE MONONITRATE ER 30 MG PO TB24
15.0000 mg | ORAL_TABLET | Freq: Every day | ORAL | Status: DC
  Administered 2023-03-20 – 2023-03-30 (×11): 15 mg via ORAL
  Filled 2023-03-19 (×11): qty 1

## 2023-03-19 MED ORDER — ACETAMINOPHEN 650 MG RE SUPP: 650.0000 mg | RECTAL | Status: DC | PRN

## 2023-03-19 NOTE — Progress Notes (Signed)
 [name], male   DOB: 11-26-1928, 89 y.o.   MRN: [id number]  INPATIENT REHABILITATION ADMISSION NOTE   Arrival Method: bed     Mental Orientation: x4   Assessment: see flowsheet   Skin: CDI   IV'S: n/a   Pain: none reported   Tubes and Drains: n/a   Safety Measures: in place   Vital Signs: see flowsheet   Height and Weight: see flowsheet   Rehab Orientation: completed   Family: at bedside

## 2023-03-19 NOTE — H&P (Signed)
 Physical Medicine and Rehabilitation Admission H&P        Chief Complaint  Patient presents with   Numbness  : HPI: Brandon Harris is a 89 year old right-handed male with history significant for CAD/CABG 2001 maintained on low-dose aspirin , CKD stage IV, history of prior CVA, hypertension, mesenteric artery stenosis followed by [facility], history of prostate cancer, right total knee arthroplasty 2016, back surgery x 4.  Per chart review patient lives with his daughter.  1 level home with a ramped entrance.  Ambulatory with a rollator.  He still drives to church.  Patient recently came to the ER 4 days ago because of abdominal pain at that time CT chest abdomen pelvis showed circumferential thickening at the anorectal junction distention versus proctitis, diverticulosis, right upper quadrant.  He was discharged to home on Augmentin  per GI services..  Presented 03/13/2023 with persistent left upper and lower extremity numbness and weakness.  Admission chemistries unremarkable except sodium 133, glucose 117 creatinine 2.26, hemoglobin 9.7, lactic acid unremarkable, urinalysis negative nitrite.  CT/MRI showed small acute right frontal parietal infarcts.  Moderate to severe chronic small vessel ischemic disease with numerous chronic infarcts.  MRA of the head unremarkable.  MRA of the neck showed severely technically degraded MRA essentially nondiagnostic.  Patient did not receive tPA.  He was found to be in A-fib with RVR and cardiology services consulted.  Placed on IV heparin  transition to Eliquis .  Echocardiogram ejection fraction of 65 to 70%.  No wall motion abnormalities.  Plans to follow-up outpatient considering DCCV if needed.  Tolerating a regular consistency diet.  Therapy evaluations completed due to patient decreased functional ability left side weakness was admitted for a comprehensive rehab program.   Review of Systems  Constitutional:  Negative for chills and fever.  HENT:  Negative for hearing loss.    Eyes:  Negative for blurred vision and double vision.  Respiratory:  Negative for cough, shortness of breath and wheezing.   Cardiovascular:  Negative for chest pain and palpitations.  Gastrointestinal:  Positive for constipation, nausea and vomiting. Negative for heartburn.       GERD  Genitourinary:  Positive for urgency. Negative for dysuria, flank pain and hematuria.       Urinary stress incontinence  Musculoskeletal:  Positive for back pain, joint pain and myalgias.  Skin:  Negative for rash.  Neurological:  Positive for sensory change and focal weakness.  Psychiatric/Behavioral:  Positive for depression.        Anxiety  All other systems reviewed and are negative.       Past Medical History:  Diagnosis Date   Arthritis     Carotid arterial disease (HCC)     Chronic kidney disease (CKD), stage IV (severe) (HCC)     Diverticulosis      colonic diverticulosis   Gallstones     GERD (gastroesophageal reflux disease)     Hypertension     Myocardial infarction [facility])     Prostate cancer (HCC)     Status post coronary artery bypass grafting       intraoperative cholangiogram   Stroke [facility])      patient reports no deficits    Urinary incontinence      bladder leakage -  patient wears a pad             Past Surgical History:  Procedure Laterality Date   BACK SURGERY        x 4, has titanium in back per patient  CHOLECYSTECTOMY, LAPAROSCOPIC       CORONARY ARTERY BYPASS GRAFT   2001   CRYOTHERAPY        Prostate Cancer   LEFT HEART CATH AND CORS/GRAFTS ANGIOGRAPHY N/A 10/27/2017    Procedure: LEFT HEART CATH AND CORS/GRAFTS ANGIOGRAPHY;  Surgeon: Claudene Victory ORN, MD;  Location: [facility] INVASIVE CV LAB;  Service: Cardiovascular;  Laterality: N/A;   prostate cancer       ROTATOR CUFF REPAIR        LEFT   TOTAL KNEE ARTHROPLASTY Right 12/18/2014    Procedure: TOTAL RIGHT KNEE ARTHROPLASTY;  Surgeon: Donnice Car, MD;  Location: [facility] ORS;  Service: Orthopedics;  Laterality:  Right;             Family History  Problem Relation Age of Onset   Heart failure Mother          Social History:  reports that he has never smoked. He has never used smokeless tobacco. He reports that he does not drink alcohol and does not use drugs. Allergies:  Allergies       Allergies  Allergen Reactions   Amlodipine  Besylate        Other reaction(s): 10 mg leads to swelling; tolerates 5 mg   Mirabegron         Other reaction(s): Unknown   Vibegron        Other reaction(s): mental status changes            Medications Prior to Admission  Medication Sig Dispense Refill   ALPRAZolam  (XANAX ) 0.25 MG tablet Take 0.25 mg by mouth at bedtime as needed for anxiety or sleep. TAKE ONE TABLET AT NIGHT       amitriptyline (ELAVIL) 10 MG tablet Take 20 mg by mouth at bedtime.   0   amLODipine  (NORVASC ) 10 MG tablet Take 1 tablet (10 mg total) by mouth every morning. 90 tablet 0   amoxicillin -clavulanate (AUGMENTIN ) 875-125 MG tablet Take 1 tablet by mouth every 12 (twelve) hours. 14 tablet 0   atorvastatin  (LIPITOR ) 80 MG tablet Take 80 mg by mouth every evening.        CALCIUM  PO Take 1 tablet by mouth daily.       carvedilol  (COREG ) 12.5 MG tablet Take one tablet by mouth every morning and take one tablet by mouth everyday at bedtime 180 tablet 0   Ferrous Sulfate  (IRON PO) Take 1 tablet by mouth daily.       furosemide  (LASIX ) 20 MG tablet TAKE 1 TABLET BY MOUTH WEEKLY ON MONDAY, WEDNESDAY & FRIDAY       hydrALAZINE  (APRESOLINE ) 25 MG tablet Take 1 tablet (25 mg total) by mouth 3 (three) times daily. To be taken with (50 mg) tablet three times daily. 270 tablet 0   hydrALAZINE  (APRESOLINE ) 50 MG tablet Take one tablet by mouth every morning and take one tablet by mouth at noon and take one tablet by mouth everyday at bedtime. 270 tablet 0   isosorbide  mononitrate (IMDUR ) 60 MG 24 hr tablet Take 1 tablet (60 mg total) by mouth every morning. 90 tablet 0   Melatonin 10 MG TABS Take 10  mg by mouth daily.       mirtazapine  (REMERON ) 15 MG tablet Take 15 mg by mouth at bedtime.       nitroGLYCERIN  (NITROSTAT ) 0.4 MG SL tablet Dissolve 1 tab under tongue as needed for chest pain. May repeat every 5 minutes x 2 doses. If no relief call 9-1-1. 25 tablet  11   ondansetron  (ZOFRAN ) 4 MG tablet Take 1 tablet (4 mg total) by mouth every 8 (eight) hours as needed for nausea or vomiting. 15 tablet 0   pantoprazole  (PROTONIX ) 40 MG tablet TAKE ONE TABLET BY MOUTH EVERY MORNING 90 tablet 0   zolpidem  (AMBIEN ) 5 MG tablet Take 5 mg by mouth at bedtime.       aspirin  EC 81 MG tablet Take 1 tablet (81 mg total) by mouth daily. (Patient not taking: Reported on 02/16/2023) 90 tablet 3   tamsulosin  (FLOMAX ) 0.4 MG CAPS capsule Take 0.8 mg by mouth daily. Take two (2) capsules (0.8 mg) by mouth daily.  (Patient not taking: Reported on 02/16/2023)   11   traMADol  (ULTRAM ) 50 MG tablet Take 50 mg by mouth every 6 (six) hours as needed for moderate pain or severe pain. (Patient not taking: Reported on 02/16/2023)                  Home: Home Living Family/patient expects to be discharged to:: Private residence Living Arrangements: Children Available Help at Discharge: Family, Available 24 hours/day (Simultaneous filing. User may not have seen previous data.) Type of Home: House (Simultaneous filing. User may not have seen previous data.) Home Access: Stairs to enter, Ramped entrance Entrance Stairs-Number of Steps: 3 in garage Entrance Stairs-Rails: Right Home Layout: One level Bathroom Shower/Tub: Tub/shower unit Home Equipment: Agricultural [employer]ant (2 wheels), Rollator (4 wheels), Cane - single point Additional Comments: Patient reports he prefers to take tub baths and has grab bars he uses to get in and out of the tub.   Functional History: Prior Function Prior Level of Function : Driving, Needs assist Physical Assist : ADLs (physical) ADLs (physical): IADLs, Dressing Mobility Comments: Pt reports  he needs PRN assist for bed mobility, transfers, gait. Pt reports he's ambulatory with rollator, denies falls, still drives to church. ADLs Comments: Patient needs occsional assist with donning/ doffing jackets or long sleeve shirts   Functional Status:  Mobility: Bed Mobility Overal bed mobility: Needs Assistance Bed Mobility: Supine to Sit, Sit to Supine Supine to sit: HOB elevated, Used rails, Min assist Sit to supine: Contact guard assist General bed mobility comments: min A to initiate LLE to and off EOB and to elevate trunk, able to return to supine without physical assist Transfers Overall transfer level: Needs assistance Equipment used: Rolling walker (2 wheels) Transfers: Sit to/from Stand Sit to Stand: Mod assist, Ezariah assist General transfer comment: Dequavius A on initial stand, mod A to stand x2 at end of session, poor initiation during all attempts and assist needed to boost Ambulation/Gait Ambulation/Gait assistance: Min assist Gait Distance (Feet): 78 Feet Assistive device: Rolling walker (2 wheels) Gait Pattern/deviations: Decreased step length - right, Decreased step length - left, Decreased stride length, Decreased dorsiflexion - right, Decreased dorsiflexion - left General Gait Details: Pt pushing RW out in front of him. and with difficulty advancing LLE at times, cues for upright trunk and bigger steps on L with pt able to briefly correct but unable to maintain Gait velocity: decreased   ADL: ADL Overall ADL's : Needs assistance/impaired Eating/Feeding: Set up Eating/Feeding Details (indicate cue type and reason): Positioning will need to be upright due to lack of shoulder ROM Grooming: Minimal assistance Upper Body Bathing: Minimal assistance Lower Body Bathing: Moderate assistance Upper Body Dressing : Moderate assistance Lower Body Dressing: Moderate assistance Toilet Transfer: Moderate assistance Toileting- Clothing Manipulation and Hygiene: Moderate  assistance Functional mobility during ADLs: Moderate  assistance General ADL Comments: Patient needing cueing for safe use of walker, noted ataxic movements in LLE with stepping forward or backward with a tendency to flex at the hips with backward stepping. Also noted hyperextension of the LLE.  Patient SOB with standing <5 minutes.  Patient was able to release BUE in standing and / or use BUE for support   Cognition: Cognition Overall Cognitive Status: Within Functional Limits for tasks assessed Arousal/Alertness: Awake/alert Orientation Level: Oriented X4 Attention: Selective Selective Attention: Appears intact Memory: Impaired Memory Impairment: Decreased recall of new information Executive Function: Self Correcting Self Correcting: Impaired Cognition Arousal: Alert Behavior During Therapy: WFL for tasks assessed/performed Overall Cognitive Status: Within Functional Limits for tasks assessed General Comments: AxOx4   Physical Exam: Blood pressure (!) 150/72, pulse 81, temperature 97.6 F (36.4 C), temperature source Oral, resp. rate 18, height 5' 11 (1.803 m), weight 92.1 kg, SpO2 99%. Physical Exam Constitutional:      General: He is not in acute distress. HENT:     Head: Normocephalic.     Right Ear: External ear normal.     Left Ear: External ear normal.     Nose: Nose normal.     Mouth/Throat:     Mouth: Mucous membranes are moist.     Pharynx: Oropharynx is clear.  Eyes:     Extraocular Movements: Extraocular movements intact.     Conjunctiva/sclera: Conjunctivae normal.     Pupils: Pupils are equal, round, and reactive to light.  Cardiovascular:     Rate and Rhythm: Normal rate. Rhythm irregular.     Heart sounds: Murmur heard.  Pulmonary:     Effort: Pulmonary effort is normal. No respiratory distress.     Breath sounds: No wheezing.  Abdominal:     General: Bowel sounds are normal. There is no distension.     Tenderness: There is no abdominal tenderness.   Musculoskeletal:        General: Swelling (slight at left ankle) and tenderness (Right chest wall and left ankle) present.     Cervical back: Normal range of motion.     Comments: Both shoulders with limited movement d/t RTC injuries  Skin:    General: Skin is warm and dry.     Findings: Bruising present.  Neurological:     Mental Status: He is alert.     Comments: Alert and oriented to person, place, month/year and reason he's here. Normal insight and awareness. Intact Memory. Normal language with dysarthric speech. Cranial nerve exam unremarkable except for left central 7. Mild left inattention. MMT: Both shoulders limited d/t pain/chronic RTC injuries. RUE 4 to 4+/5 prox to distal. LUE 3+ to 4- bicep, triceps, wrist and hand. LLE 3/5 HF, KE and 3+ with ankle DF/PF (pain), RLE 4 to 4+/5 prox to distal. Sensory exam normal for light touch and pain on right, sl decrease on left arm and leg. No limb ataxia or cerebellar signs. No abnormal tone appreciated.  SABRA           Psychiatric:        Mood and Affect: Mood normal.        Behavior: Behavior normal.        Lab Results Last 48 Hours        Results for orders placed or performed during the hospital encounter of 03/13/23 (from the past 48 hours)  CBC     Status: Abnormal    Collection Time: 03/17/23  6:14 AM  Result Value Ref  Range    WBC 9.3 4.0 - 10.5 K/uL    RBC 3.10 (L) 4.22 - 5.81 MIL/uL    Hemoglobin 9.3 (L) 13.0 - 17.0 g/dL    HCT 71.2 (L) 60.9 - 52.0 %    MCV 92.6 80.0 - 100.0 fL    MCH 30.0 26.0 - 34.0 pg    MCHC 32.4 30.0 - 36.0 g/dL    RDW 85.8 88.4 - 84.4 %    Platelets 294 150 - 400 K/uL    nRBC 0.0 0.0 - 0.2 %      Comment: Performed at [facility], [address] [place], [place] [address]  Basic metabolic panel     Status: Abnormal    Collection Time: 03/17/23  6:14 AM  Result Value Ref Range    Sodium 137 135 - 145 mmol/L    Potassium 4.2 3.5 - 5.1 mmol/L    Chloride 104 98 - 111 mmol/L    CO2 23 22  - 32 mmol/L    Glucose, Bld 91 70 - 99 mg/dL      Comment: Glucose reference range applies only to samples taken after fasting for at least 8 hours.    BUN 18 8 - 23 mg/dL    Creatinine, Ser 7.93 (H) 0.61 - 1.24 mg/dL    Calcium  8.7 (L) 8.9 - 10.3 mg/dL    GFR, Estimated 30 (L) >60 mL/min      Comment: (NOTE) Calculated using the CKD-EPI Creatinine Equation (2021)      Anion gap 10 5 - 15      Comment: Performed at [facility], [address], [place], [place] [address]  CBC     Status: Abnormal    Collection Time: 03/18/23  6:10 AM  Result Value Ref Range    WBC 10.6 (H) 4.0 - 10.5 K/uL    RBC 3.08 (L) 4.22 - 5.81 MIL/uL    Hemoglobin 9.3 (L) 13.0 - 17.0 g/dL    HCT 71.4 (L) 60.9 - 52.0 %    MCV 92.5 80.0 - 100.0 fL    MCH 30.2 26.0 - 34.0 pg    MCHC 32.6 30.0 - 36.0 g/dL    RDW 85.8 88.4 - 84.4 %    Platelets 289 150 - 400 K/uL    nRBC 0.0 0.0 - 0.2 %      Comment: Performed at [facility], [address], [place], [place] [address]  Basic metabolic panel     Status: Abnormal    Collection Time: 03/18/23  6:10 AM  Result Value Ref Range    Sodium 135 135 - 145 mmol/L    Potassium 3.7 3.5 - 5.1 mmol/L    Chloride 106 98 - 111 mmol/L    CO2 21 (L) 22 - 32 mmol/L    Glucose, Bld 95 70 - 99 mg/dL      Comment: Glucose reference range applies only to samples taken after fasting for at least 8 hours.    BUN 22 8 - 23 mg/dL    Creatinine, Ser 7.80 (H) 0.61 - 1.24 mg/dL    Calcium  8.6 (L) 8.9 - 10.3 mg/dL    GFR, Estimated 28 (L) >60 mL/min      Comment: (NOTE) Calculated using the CKD-EPI Creatinine Equation (2021)      Anion gap 8 5 - 15      Comment: Performed at [facility], [address], [place], [place] [address]  Imaging Results (Last 48 hours)  No results found.         Blood pressure (!) 150/72, pulse 81, temperature 97.6 F (36.4 C), temperature source Oral, resp. rate 18, height 5' 11 (1.803 m), weight 92.1 kg, SpO2 99%.    Medical Problem List and Plan: 1. Functional deficits secondary to right MCA cortical several small infarcts likely secondary to newly diagnosed A-fib RVR             -patient may  shower             -ELOS/Goals: 11-15 days, supervision to mod I goals with PT, OT, SLP 2.  Antithrombotics: -DVT/anticoagulation:  Pharmaceutical: Eliquis              -antiplatelet therapy: N/A 3. 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 Pt c/o pain in this area             -now with pain and sl swelling around left ankle without known injury             -will check xray of left ankle             -try voltaren  gel to right chest wall and ankle TID.              -observe for activity tolerance 4. Mood/Behavior/Sleep: Remeron  15 mg nightly.  Xanax  0.25 mg nightly as needed             -antipsychotic agents: N/A 5. Neuropsych/cognition: This patient is capable of making decisions on his own behalf. 6. Skin/Wound Care: Routine skin checks 7. Fluids/Electrolytes/Nutrition: Routine and analysis follow-up chemistries 8.  Newly diagnosed A-fib with RVR.  Continue Eliquis  as well as Coreg  12.5 mg twice daily.  Plan outpatient follow-up consider DCCV 9.  CAD/CABG.  Imdur  15 mg daily. 10.  Hypertension.  Monitor with increased mobility.  Hydralazine  and Norvasc  currently on hold due to soft blood pressure and resume as needed 11.  History of prostate cancer with recent E. coli UTI/prostatitis.  Complete course of Augmentin  12.  CKD stage IV.  Baseline creatinine 2.0-2.2.  Follow-up chemistries on Monday 13.  GERD/abdominal pain/nausea and vomiting.  Seen by GI recommended empiric PPI daily, Carafate /Flagyl .  No plans for endoscopic intervention at this time             -no recorded emesis since I saw him in consult 14.  Hyperlipidemia.  Lipitor  15.  History of mesenteric artery stenosis.  Follow-up outpatient 16.  Anemia of chronic disease.  Continue iron supplement.  Occult blood negative.   Follow-up CBC   Toribio JINNY Pitch, PA-C 03/19/2023  I have personally performed a face to face diagnostic evaluation of this patient and formulated the key components of the plan.  Additionally, I have personally reviewed laboratory data, imaging studies, as well as relevant notes and concur with the physician assistant's documentation above.  The patient's status has not changed from the original H&P.  Any changes in documentation from the acute care chart have been noted above.  Arthea IVAR Gunther, MD, LEELLEN

## 2023-03-19 NOTE — TOC Transition Note (Signed)
 Transition of Care [facility]) - Discharge Note   Patient Details  Name: HENRIK ORIHUELA MRN: [id number] Date of Birth: 10/08/1928  [facility] CM/SW Contact:  Andrez JULIANNA George, RN Phone Number: 03/19/2023, 12:19 PM   Clinical Narrative:     Pt is discharging to CIR today. CM signing off.    Final next level of care: IP Rehab Facility Barriers to Discharge: No Barriers Identified   Patient Goals and CMS Choice   CMS Medicare.gov Compare Post Acute Care list provided to:: Patient Choice offered to / list presented to : Patient, Adult Children      Discharge Placement                       Discharge Plan and Services Additional resources added to the After Visit Summary for       Post Acute Care Choice: IP Rehab                               Social Drivers of Health (SDOH) Interventions SDOH Screenings   Food Insecurity: No Food Insecurity (03/14/2023)  Housing: Low Risk  (03/14/2023)  Transportation Needs: No Transportation Needs (03/14/2023)  Utilities: Not At Risk (03/14/2023)  Social Connections: Moderately Isolated (03/14/2023)  Tobacco Use: Low Risk  (03/14/2023)     Readmission Risk Interventions     No data to display

## 2023-03-19 NOTE — Progress Notes (Signed)
 Inpatient Rehab Admissions Coordinator:  There is a bed available in CIR for pt today. Dr. Carlette Cheers aware and in agreement. Pt, pt's daughter Leola Raisin, NSG and TOC made aware.

## 2023-03-19 NOTE — Plan of Care (Signed)
 Problem: Education: Goal: Knowledge of disease or condition will improve 03/19/2023 0501 by Jori Roderic CROME, RN Outcome: Progressing 03/19/2023 0425 by Jori Roderic CROME, RN Outcome: Progressing Goal: Knowledge of secondary prevention will improve (MUST DOCUMENT ALL) 03/19/2023 0501 by Jori Roderic CROME, RN Outcome: Progressing 03/19/2023 0425 by Jori Roderic CROME, RN Outcome: Progressing Goal: Knowledge of patient specific risk factors will improve (DELETE if not current risk factor) 03/19/2023 0501 by Jori Roderic CROME, RN Outcome: Progressing 03/19/2023 0425 by Jori Roderic CROME, RN Outcome: Progressing   Problem: Ischemic Stroke/TIA Tissue Perfusion: Goal: Complications of ischemic stroke/TIA will be minimized 03/19/2023 0501 by Jori Roderic CROME, RN Outcome: Progressing 03/19/2023 0425 by Jori Roderic CROME, RN Outcome: Progressing   Problem: Coping: Goal: Will verbalize positive feelings about self 03/19/2023 0501 by Jori Roderic CROME, RN Outcome: Progressing 03/19/2023 0425 by Jori Roderic CROME, RN Outcome: Progressing Goal: Will identify appropriate support needs 03/19/2023 0501 by Jori Roderic CROME, RN Outcome: Progressing 03/19/2023 0425 by Jori Roderic CROME, RN Outcome: Progressing   Problem: Health Behavior/Discharge Planning: Goal: Ability to manage health-related needs will improve 03/19/2023 0501 by Jori Roderic CROME, RN Outcome: Progressing 03/19/2023 0425 by Jori Roderic CROME, RN Outcome: Progressing Goal: Goals will be collaboratively established with patient/family 03/19/2023 0501 by Jori Roderic CROME, RN Outcome: Progressing 03/19/2023 0425 by Jori Roderic CROME, RN Outcome: Progressing   Problem: Self-Care: Goal: Ability to participate in self-care as condition permits will improve 03/19/2023 0501 by Jori Roderic CROME, RN Outcome: Progressing 03/19/2023 0425 by Jori Roderic CROME, RN Outcome: Progressing Goal: Verbalization  of feelings and concerns over difficulty with self-care will improve 03/19/2023 0501 by Jori Roderic CROME, RN Outcome: Progressing 03/19/2023 0425 by Jori Roderic CROME, RN Outcome: Progressing Goal: Ability to communicate needs accurately will improve 03/19/2023 0501 by Jori Roderic CROME, RN Outcome: Progressing 03/19/2023 0425 by Jori Roderic CROME, RN Outcome: Progressing   Problem: Nutrition: Goal: Risk of aspiration will decrease 03/19/2023 0501 by Jori Roderic CROME, RN Outcome: Progressing 03/19/2023 0425 by Jori Roderic CROME, RN Outcome: Progressing Goal: Dietary intake will improve 03/19/2023 0501 by Jori Roderic CROME, RN Outcome: Progressing 03/19/2023 0425 by Jori Roderic CROME, RN Outcome: Progressing   Problem: Education: Goal: Knowledge of General Education information will improve Description: Including pain rating scale, medication(s)/side effects and non-pharmacologic comfort measures 03/19/2023 0501 by Jori Roderic CROME, RN Outcome: Progressing 03/19/2023 0425 by Jori Roderic CROME, RN Outcome: Progressing   Problem: Health Behavior/Discharge Planning: Goal: Ability to manage health-related needs will improve 03/19/2023 0501 by Jori Roderic CROME, RN Outcome: Progressing 03/19/2023 0425 by Jori Roderic CROME, RN Outcome: Progressing   Problem: Clinical Measurements: Goal: Ability to maintain clinical measurements within normal limits will improve 03/19/2023 0501 by Jori Roderic CROME, RN Outcome: Progressing 03/19/2023 0425 by Jori Roderic CROME, RN Outcome: Progressing Goal: Will remain free from infection 03/19/2023 0501 by Jori Roderic CROME, RN Outcome: Progressing 03/19/2023 0425 by Jori Roderic CROME, RN Outcome: Progressing Goal: Diagnostic test results will improve 03/19/2023 0501 by Jori Roderic CROME, RN Outcome: Progressing 03/19/2023 0425 by Jori Roderic CROME, RN Outcome: Progressing Goal: Respiratory complications will  improve 03/19/2023 0501 by Jori Roderic CROME, RN Outcome: Progressing 03/19/2023 0425 by Jori Roderic CROME, RN Outcome: Progressing Goal: Cardiovascular complication will be avoided 03/19/2023 0501 by Jori Roderic CROME, RN Outcome: Progressing 03/19/2023 0425 by Jori Roderic CROME, RN Outcome: Progressing   Problem: Activity: Goal: Risk for activity intolerance will decrease 03/19/2023 0501 by Jori Roderic CROME, RN Outcome: Progressing 03/19/2023 0425 by  Jori Roderic CROME, RN Outcome: Progressing   Problem: Nutrition: Goal: Adequate nutrition will be maintained 03/19/2023 0501 by Jori Roderic CROME, RN Outcome: Progressing 03/19/2023 0425 by Jori Roderic CROME, RN Outcome: Progressing   Problem: Coping: Goal: Level of anxiety will decrease 03/19/2023 0501 by Jori Roderic CROME, RN Outcome: Progressing 03/19/2023 0425 by Jori Roderic CROME, RN Outcome: Progressing   Problem: Elimination: Goal: Will not experience complications related to bowel motility 03/19/2023 0501 by Jori Roderic CROME, RN Outcome: Progressing 03/19/2023 0425 by Jori Roderic CROME, RN Outcome: Progressing Goal: Will not experience complications related to urinary retention 03/19/2023 0501 by Jori Roderic CROME, RN Outcome: Progressing 03/19/2023 0425 by Jori Roderic CROME, RN Outcome: Progressing   Problem: Pain Managment: Goal: General experience of comfort will improve and/or be controlled 03/19/2023 0501 by Jori Roderic CROME, RN Outcome: Progressing 03/19/2023 0425 by Jori Roderic CROME, RN Outcome: Progressing   Problem: Safety: Goal: Ability to remain free from injury will improve 03/19/2023 0501 by Jori Roderic CROME, RN Outcome: Progressing 03/19/2023 0425 by Jori Roderic CROME, RN Outcome: Progressing   Problem: Skin Integrity: Goal: Risk for impaired skin integrity will decrease 03/19/2023 0501 by Jori Roderic CROME, RN Outcome: Progressing 03/19/2023 0425 by Jori Roderic CROME, RN Outcome: Progressing

## 2023-03-19 NOTE — Progress Notes (Signed)
 Signed     Expand All Collapse All PMR Admission Coordinator Pre-Admission Assessment   Patient: Brandon Harris is an 89 y.o., male MRN: [id number] DOB: [birth year] Height: 5' 11 (180.3 cm) Weight: 92.1 kg                                                                                                                                                  Insurance Information HMO: yes    PPO:      PCP:      IPA:      80/20:      OTHER:  PRIMARY: [employer] Medicare      Policy#: [id number]      Subscriber: pt CM Name: Curlee      Phone#: [phone]      Fax#: [phone] Pre-Cert#: [id number] auth for CIR from [facility] with updates due to fax listed above on 2/11      Employer:  Benefits:  Phone #: [phone]     Name:  Eff. Date: 02/10/23     Deduct: $0      Out of Pocket [place]: $3600 (met $20 met)      Life [place]: n/a  CIR: $300/day for days 1-5      SNF: 20 full days Outpatient:      Co-Pay: $20/visit Home Health: 100%      Co-Pay:  DME: 80%     Co-Pay: 20% Providers: in-network SECONDARY:       Policy#:       Phone#:    Artist:       Phone#:    The Data Processing Manager" for patients in Inpatient Rehabilitation Facilities with attached "Privacy Act Statement-Health Care Records" was provided and verbally reviewed with: Patient and Family   Emergency Contact Information Contact Information       Name Relation Home Work Mobile    Davis,Janice Daughter [phone]   [phone]         Other Contacts   None on File      Current Medical History  Patient Admitting Diagnosis: CVA  History of Present Illness: Pt is a 89 y/o male with PMH of CAD s/p CABG, HTN, CKD IV, stroke, mesenteric artery stenosis, prostate cancer, MI, and B rotator cuff repairs who was admitted to [facility]23 with c/o L sided numbness x48 hours.  In ED EKG concerning for new onset afib, labs creatinine 2.2, hemoglobin 9.7, UA done prior to admit positive for  e.coli.  MRI w/o showed small acute right frontoparietal strokes.  Modified rankin 2, NIHSS 0.  Outside window for TNK, not a candidate for mechanical intervention due to low LVO suspicion.  TEE showed EF 65-70%.  Neurology recommended cardiology consult for risk assessment of anticoagulation in the setting of new afib.  Cardiology recommendations to transition  to IV heparin  to reduced dose eliquis  BID and stop aspirin .  Therapy evaluations completed and pt was recommended for CIR.    Complete NIHSS TOTAL: 3 Glasgow Coma Scale Score: 15   Patient's medical record from [facility] has been reviewed by the rehabilitation admission coordinator and physician.   Past Medical History      Past Medical History:  Diagnosis Date   Arthritis     Carotid arterial disease (HCC)     Chronic kidney disease (CKD), stage IV (severe) (HCC)     Diverticulosis      colonic diverticulosis   Gallstones     GERD (gastroesophageal reflux disease)     Hypertension     Myocardial infarction [facility])     Prostate cancer (HCC)     Status post coronary artery bypass grafting       intraoperative cholangiogram   Stroke [facility])      patient reports no deficits    Urinary incontinence      bladder leakage -  patient wears a pad          Has the patient had major surgery during 100 days prior to admission? No   Family History  family history includes Heart failure in his mother.     Current Medications   Current Medications    Current Facility-Administered Medications:    acetaminophen  (TYLENOL ) tablet 650 mg, 650 mg, Oral, Q4H PRN **OR** acetaminophen  (TYLENOL ) 160 MG/5ML solution 650 mg, 650 mg, Per Tube, Q4H PRN **OR** acetaminophen  (TYLENOL ) suppository 650 mg, 650 mg, Rectal, Q4H PRN, Franky Redia SAILOR, MD   ALPRAZolam  (XANAX ) tablet 0.25 mg, 0.25 mg, Oral, QHS PRN, Sebastian Toribio GAILS, MD, 0.25 mg at 03/17/23 2113   amoxicillin -clavulanate (AUGMENTIN ) 500-125 MG per tablet 1 tablet, 1 tablet, Oral,  Q12H, Sebastian Toribio GAILS, MD, 1 tablet at 03/18/23 2150   apixaban  (ELIQUIS ) tablet 2.5 mg, 2.5 mg, Oral, BID, Haley, Sheng L, PA-C, 2.5 mg at 03/18/23 2150   atorvastatin  (LIPITOR ) tablet 80 mg, 80 mg, Oral, QPM, Franky Redia SAILOR, MD, 80 mg at 03/18/23 1735   carvedilol  (COREG ) tablet 12.5 mg, 12.5 mg, Oral, BID WC, Kakrakandy, Arshad N, MD, 12.5 mg at 03/19/23 9155   ferrous sulfate  tablet 325 mg, 325 mg, Oral, Daily, Sebastian Toribio GAILS, MD, 325 mg at 03/18/23 9183   hydrALAZINE  (APRESOLINE ) injection 10 mg, 10 mg, Intravenous, Q6H PRN, Franky Redia SAILOR, MD   isosorbide  mononitrate (IMDUR ) 24 hr tablet 15 mg, 15 mg, Oral, Daily, Sebastian Toribio GAILS, MD, 15 mg at 03/18/23 0815   metroNIDAZOLE  (FLAGYL ) tablet 500 mg, 500 mg, Oral, Q12H, Reome, Earle J, RPH, 500 mg at 03/18/23 2150   mirtazapine  (REMERON ) tablet 15 mg, 15 mg, Oral, QHS, Sebastian Toribio GAILS, MD, 15 mg at 03/18/23 2150   pantoprazole  (PROTONIX ) EC tablet 40 mg, 40 mg, Oral, Daily, Vreeland, Claire H, DO, 40 mg at 03/18/23 0816   sucralfate  (CARAFATE ) 1 GM/10ML suspension 1 g, 1 g, Oral, TID WC & HS, Kriss Stagger H, DO, 1 g at 03/19/23 0844     Patients Current Diet:  Diet Order                  Diet Heart Fluid consistency: Thin  Diet effective now                         Precautions / Restrictions Precautions Precautions: Fall Restrictions Weight Bearing Restrictions Per Provider Order: No  Has the patient had 2 or more falls or a fall with injury in the past year?No   Prior Activity Level Community (5-7x/wk): independent, driving, 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 Pt reports he's ambulatory with rollator, denies  falls, still drives to church. ADLs Comments: Patient needs occsional assist with donning/ doffing jackets or long sleeve shirts   Self Care: Did the patient need help bathing, dressing, using the toilet or eating?  Independent   Indoor Mobility: Did the patient need assistance with walking from room to room (with or without device)? Independent   Stairs: Did the patient need assistance with internal or external stairs (with or without device)? Independent   Functional Cognition: Did the patient need help planning regular tasks such as shopping or remembering to take medications? Independent   Patient Information Are you of Hispanic, Latino/a,or Spanish origin?: A. No, not of Hispanic, Latino/a, or Spanish origin What is your race?: A. White Do you need or want an interpreter to communicate with a doctor or health care staff?: 0. No   Patient's Response To:  Health Literacy and Transportation Is the patient able to respond to health literacy and transportation needs?: Yes Health Literacy - How often do you need to have someone help you when you read instructions, pamphlets, or other written material from your doctor or pharmacy?: Never In the past 12 months, has lack of transportation kept you from medical appointments or from getting medications?: No In the past 12 months, has lack of transportation kept you from meetings, work, or from getting things needed for daily living?: No   Home Assistive Devices / Equipment Home Equipment: Agricultural Consultant (2 wheels), Rollator (4 wheels), [employer] - single point   Prior Device Use: Indicate devices/aids used by the patient prior to current illness, exacerbation or injury?  Cane occasionally   Current Functional Level Cognition   Arousal/Alertness: Awake/alert Overall Cognitive Status: Within Functional Limits for tasks assessed Orientation Level: Oriented X4 General Comments: AxOx4 Attention: Selective Selective Attention: Appears  intact Memory: Impaired Memory Impairment: Decreased recall of new information Executive Function: Self Correcting Self Correcting: Impaired    Extremity Assessment (includes Sensation/Coordination)   Upper Extremity Assessment: LUE deficits/detail, RUE deficits/detail, Right hand dominant RUE Deficits / Details: hx of R index finger amputation, Patient with no active should or adduction due to rotator cuff injury LUE Deficits / Details: no active shoulder flexion, grip strength 4/5  Lower Extremity Assessment: Defer to PT evaluation     ADLs   Overall ADL's : Needs assistance/impaired Eating/Feeding: Set up Eating/Feeding Details (indicate cue type and reason): Positioning will need to be upright due to lack of shoulder ROM Grooming: Minimal assistance Upper Body Bathing: Minimal assistance Lower Body Bathing: Moderate assistance Upper Body Dressing : Moderate assistance Lower Body Dressing: Moderate assistance Toilet Transfer: Moderate assistance Toileting- Clothing Manipulation and Hygiene: Moderate assistance Functional mobility during ADLs: Moderate assistance General ADL Comments: Patient needing cueing for safe use of walker, noted ataxic movements in LLE with stepping forward or backward with a tendency to flex at the hips with backward stepping. Also noted hyperextension of the LLE.  Patient SOB with standing <5 minutes.  Patient was able to release BUE in standing and / or  use BUE for support     Mobility   Overal bed mobility: Needs Assistance Bed Mobility: Supine to Sit, Sit to Supine Supine to sit: HOB elevated, Used rails, Min assist Sit to supine: Contact guard assist General bed mobility comments: min A to initiate LLE to and off EOB and to elevate trunk, able to return to supine without physical assist     Transfers   Overall transfer level: Needs assistance Equipment used: Rolling walker (2 wheels) Transfers: Sit to/from Stand Sit to Stand: Mod assist, Porfirio  assist General transfer comment: Avrian A on initial stand, mod A to stand x2 at end of session, poor initiation during all attempts and assist needed to boost     Ambulation / Gait / Stairs / Wheelchair Mobility   Ambulation/Gait Ambulation/Gait assistance: Editor, Commissioning (Feet): 78 Feet Assistive device: Rolling walker (2 wheels) Gait Pattern/deviations: Decreased step length - right, Decreased step length - left, Decreased stride length, Decreased dorsiflexion - right, Decreased dorsiflexion - left General Gait Details: Pt pushing RW out in front of him. and with difficulty advancing LLE at times, cues for upright trunk and bigger steps on L with pt able to briefly correct but unable to maintain Gait velocity: decreased     Posture / Balance Dynamic Sitting Balance Sitting balance - Comments: Able to reach to feet in chair with no difficulty - supervision for edge of chair sitting Balance Overall balance assessment: Needs assistance Sitting-balance support: Feet supported, No upper extremity supported Sitting balance-Leahy Scale: Fair Sitting balance - Comments: Able to reach to feet in chair with no difficulty - supervision for edge of chair sitting Standing balance support: Bilateral upper extremity supported, Reliant on assistive device for balance Standing balance-Leahy Scale: Fair Standing balance comment: reliant on UE support     Special needs/care consideration None        Previous Home Environment (from acute therapy documentation) Living Arrangements: Children Available Help at Discharge: Family, Available 24 hours/day (Simultaneous filing. User may not have seen previous data.) Type of Home: House (Simultaneous filing. User may not have seen previous data.) Home Layout: One level Home Access: Stairs to enter, Ramped entrance Entrance Stairs-Rails: Right Entrance Stairs-Number of Steps: 3 in garage Bathroom Shower/Tub: Tub/shower unit Home Care Services:  No Additional Comments: Patient reports he prefers to take tub baths and has grab bars he uses to get in and out of the tub.   Discharge Living Setting Plans for Discharge Living Setting: Patient's home, Lives with (comment) (daughter) Type of Home at Discharge: House Discharge Home Layout: One level Discharge Home Access: Stairs to enter, Ramped entrance Entrance Stairs-Rails: Right Entrance Stairs-Number of Steps: 3 in garage Discharge Bathroom Shower/Tub: Tub/shower unit Discharge Bathroom Toilet: Standard Discharge Bathroom Accessibility: Yes How Accessible: Accessible via walker Does the patient have any problems obtaining your medications?: No   Social/Family/Support Systems Anticipated Caregiver: pt's daughter, Romero Anticipated Industrial/product Designer Information: [phone] Ability/Limitations of Caregiver: none stated Caregiver Availability: 24/7 Discharge Plan Discussed with Primary Caregiver: Yes Is Caregiver In Agreement with Plan?: Yes Does Caregiver/Family have Issues with Lodging/Transportation while Pt is in Rehab?: No     Goals Patient/Family Goal for Rehab: PT/OT/SLP supervision to mod I Expected length of stay: 11-15 days Additional Information: Discharge plan: return to pt's previous living arrangements with daughter available 24/7 Pt/Family Agrees to Admission and willing to participate: Yes Program Orientation Provided & Reviewed with Pt/Caregiver Including Roles  & Responsibilities: Yes     Decrease burden  of Care through IP rehab admission:  n/a     Possible need for SNF placement upon discharge: Not anticipated.  Plan to return home with daughter providing 24/7 supervision.      Patient Condition: This patient's condition remains as documented in the consult dated 03/16/23, in which the Rehabilitation Physician determined and documented that the patient's condition is appropriate for intensive rehabilitative care in an inpatient rehabilitation facility.  Will admit to inpatient rehab 03/19/23.   Preadmission Screen Completed By:  Caitlin E Warren, PT, DPT  03/19/2023 10:00 AM ______________________________________________________________________   Discussed status with Dr. Babs on 03/19/23  at 10:00 AM and received approval for admission today.   Admission Coordinator:  Caitlin E Warren, PT, DPT   time 10:00 AM/ date 03/19/23

## 2023-03-19 NOTE — Discharge Summary (Addendum)
 Physician Discharge Summary   Patient: Brandon Harris MRN: [id number] DOB: 02/20/1928  Admit date:     03/13/2023  Discharge date: 03/19/23  Discharge Physician: ATLEE ABERNETHY    PCP: Seabron Lenis, MD    Discharge Diagnoses: Principal Problem:   Acute CVA (cerebrovascular accident) [facility]) Active Problems:   Essential hypertension   Hyperlipidemia   CKD (chronic kidney disease) stage 4, GFR 15-29 ml/min (HCC)   Gastroesophageal reflux disease   Chronic anemia   UTI (urinary tract infection)   Proctitis   Atrial fibrillation (HCC)  Resolved Problems:   * No resolved hospital problems. *  Hospital Course:  89 year old male with CAD status post CABG, CKD stage IV, history of prior CVA, hypertension, mesenteric artery stenosis, history of prostate cancer initially presented to the ED 4 days prior to admission with abdominal pain, CT chest abdomen pelvis had findings concerning for chronic proctitis patient discharged on Augmentin . Patient presented back to the ED with complaints of left upper and lower extremity numbness ongoing for 48 hours, MRI brain done concerning for acute right frontal temporal parietal stroke. Patient also noted to be in new onset A-fib started on heparin  drip. Patient admitted for stroke workup. Patient seen in consultation by GI, neurology, cardiology.   2D echo showed EF of 65 to 70%,NWMA, mild LVH, mildly reduced RVSF, mildly dilated left atrial size, trivial MVR, no atrial level shunt detected by color-flow Doppler. Carotid ultrasound showed bilateral 40 to 59% ICA stenosis which may be underestimated secondary to bulky plaque and vessel tortuosity.  Right vertebral artery demonstrated antegrade flow.  Left vertebral artery demonstrates an occlusion. Pt was initially on IV heparin  drip but this was later changed to eliquis . He continues on statin. Neurology was consulted but they did sign off. Pt was accepted to [facility] on 03/19/2023. Cardiology would like to see the pt  in 3-4 weeks s/p discharge. Complete the course of augmentin  at [facility].  DISCHARGE MEDICATION: Allergies as of 03/19/2023       Reactions   Amlodipine  Besylate    Other reaction(s): 10 mg leads to swelling; tolerates 5 mg   Mirabegron     Other reaction(s): Unknown   Vibegron    Other reaction(s): mental status changes        Medication List     STOP taking these medications    amitriptyline 10 MG tablet Commonly known as: ELAVIL   amLODipine  10 MG tablet Commonly known as: NORVASC    amoxicillin -clavulanate 875-125 MG tablet Commonly known as: AUGMENTIN  Replaced by: amoxicillin -clavulanate 500-125 MG tablet   aspirin  EC 81 MG tablet   CALCIUM  PO   hydrALAZINE  25 MG tablet Commonly known as: APRESOLINE    hydrALAZINE  50 MG tablet Commonly known as: APRESOLINE    Melatonin 10 MG Tabs   tamsulosin  0.4 MG Caps capsule Commonly known as: FLOMAX    traMADol  50 MG tablet Commonly known as: ULTRAM        TAKE these medications    ALPRAZolam  0.25 MG tablet Commonly known as: XANAX  Take 0.25 mg by mouth at bedtime as needed for anxiety or sleep. TAKE ONE TABLET AT NIGHT   amoxicillin -clavulanate 500-125 MG tablet Commonly known as: AUGMENTIN  Take 1 tablet by mouth every 12 (twelve) hours. Replaces: amoxicillin -clavulanate 875-125 MG tablet   apixaban  2.5 MG Tabs tablet Commonly known as: ELIQUIS  Take 1 tablet (2.5 mg total) by mouth 2 (two) times daily.   atorvastatin  80 MG tablet Commonly known as: LIPITOR  Take 80 mg by mouth every evening.   carvedilol   12.5 MG tablet Commonly known as: COREG  Take one tablet by mouth every morning and take one tablet by mouth everyday at bedtime   furosemide  20 MG tablet Commonly known as: LASIX  TAKE 1 TABLET BY MOUTH WEEKLY ON MONDAY, WEDNESDAY & FRIDAY   IRON PO Take 1 tablet by mouth daily.   isosorbide  mononitrate 30 MG 24 hr tablet Commonly known as: IMDUR  Take 0.5 tablets (15 mg total) by mouth daily. Start  taking on: March 20, 2023 What changed:  medication strength how much to take when to take this   mirtazapine  15 MG tablet Commonly known as: REMERON  Take 15 mg by mouth at bedtime.   nitroGLYCERIN  0.4 MG SL tablet Commonly known as: NITROSTAT  Dissolve 1 tab under tongue as needed for chest pain. May repeat every 5 minutes x 2 doses. If no relief call 9-1-1.   ondansetron  4 MG tablet Commonly known as: ZOFRAN  Take 1 tablet (4 mg total) by mouth every 8 (eight) hours as needed for nausea or vomiting.   pantoprazole  40 MG tablet Commonly known as: PROTONIX  TAKE ONE TABLET BY MOUTH EVERY MORNING   sucralfate  1 GM/10ML suspension Commonly known as: CARAFATE  Take 10 mLs (1 g total) by mouth 4 (four) times daily -  with meals and at bedtime.   zolpidem  5 MG tablet Commonly known as: AMBIEN  Take 5 mg by mouth at bedtime.        Follow-up Information     Lucien Orren SAILOR, PA-C Follow up.   Specialty: Cardiology Why: Wednesday Apr 14, 2023 Arrive by 8:10 AM Appt at 8:25 AM (25 min) Contact information: [address] [place] [address] [phone]         [facility]. Schedule an appointment as soon as possible for a visit in 1 month(s).   Specialty: Neurology Why: stroke clinic Contact information: [address] [place]  [address] [phone]               Discharge Exam: Fredricka Weights   03/16/23 1210  Weight: 92.1 kg   Physical Exam HENT:     Head: Atraumatic.     Mouth/Throat:     Mouth: Mucous membranes are moist.  Cardiovascular:     Rate and Rhythm: Normal rate and regular rhythm.  Pulmonary:     Effort: Pulmonary effort is normal.  Abdominal:     Palpations: Abdomen is soft.  Musculoskeletal:        General: Normal range of motion.     Cervical back: Neck supple.  Skin:    General: Skin is warm.  Neurological:     Mental Status: He is alert. Mental status is at  baseline.  Psychiatric:        Mood and Affect: Mood normal.      Condition at discharge: fair  The results of significant diagnostics from this hospitalization (including imaging, microbiology, ancillary and laboratory) are listed below for reference.   Imaging Studies: VAS US  CAROTID Result Date: 03/15/2023 Carotid Arterial Duplex Study Patient Name:  TANYON ALIPIO  Date of Exam:   03/15/2023 Medical Rec #: [id number]      Accession #:    [id number] Date of Birth: 1928-12-16     Patient Gender: M Patient Age:   22 years Exam Location:  [facility] Procedure:      VAS US  CAROTID Referring Phys: ELLOUISE [facility] --------------------------------------------------------------------------------  Indications:       CVA, Carotid artery  disease and Numbness. Risk Factors:      Hypertension, hyperlipidemia, no history of smoking, prior                    MI, coronary artery disease, prior CVA. Other Factors:     CABG X 5, new atrial fibrillation. Limitations        Today's exam was limited due to vessel tortuosity, heavy                    calcification resulting in significant shadowing, and                    respiratory variation. Comparison Study:  Prior carotid duplex done 03/19/22 at [facility] indicating                    1-39% ICA stenosis, bilaterally. Patient has a long history                    of technically difficult carotid studies limited by calcific                    plaque, acoustic shadowing, and vessel tortuoisity with                    varying results from study to study. Performing Technologist: Alberta Lis RVS  Examination Guidelines: A complete evaluation includes B-mode imaging, spectral Doppler, color Doppler, and power Doppler as needed of all accessible portions of each vessel. Bilateral testing is considered an integral part of a complete examination. Limited examinations for reoccurring indications may be performed as noted.  Right Carotid Findings:  +----------+--------+--------+--------+----------------------+---------+           PSV cm/sEDV cm/sStenosisPlaque Description    Comments  +----------+--------+--------+--------+----------------------+---------+ CCA Prox  40      13              irregular and calcific          +----------+--------+--------+--------+----------------------+---------+ CCA Distal66      22              irregular and calcific          +----------+--------+--------+--------+----------------------+---------+ ICA Prox  182     37      40-59%  calcific              Shadowing +----------+--------+--------+--------+----------------------+---------+ ICA Mid   54      15                                    tortuous  +----------+--------+--------+--------+----------------------+---------+ ICA Distal47      23                                    tortuous  +----------+--------+--------+--------+----------------------+---------+ ECA       66      9                                               +----------+--------+--------+--------+----------------------+---------+ +----------+--------+-------+--------+-------------------+           PSV cm/sEDV cmsDescribeArm Pressure (mmHG) +----------+--------+-------+--------+-------------------+ Dlarojcpjw50                                         +----------+--------+-------+--------+-------------------+ +---------+--------+--+--------+--+---------+  VertebralPSV cm/s55EDV cm/s15Antegrade +---------+--------+--+--------+--+---------+  Left Carotid Findings: +----------+--------+--------+--------+------------------+--------+           PSV cm/sEDV cm/sStenosisPlaque DescriptionComments +----------+--------+--------+--------+------------------+--------+ CCA Prox  60      9                                          +----------+--------+--------+--------+------------------+--------+ CCA Distal61      3                                           +----------+--------+--------+--------+------------------+--------+ ICA Prox  163     40      40-59%                    tortuous +----------+--------+--------+--------+------------------+--------+ ICA Mid   105     23                                tortuous +----------+--------+--------+--------+------------------+--------+ ICA Distal                                          tortuous +----------+--------+--------+--------+------------------+--------+ ECA       130     13                                         +----------+--------+--------+--------+------------------+--------+ +----------+--------+--------+--------+-------------------+           PSV cm/sEDV cm/sDescribeArm Pressure (mmHG) +----------+--------+--------+--------+-------------------+ Dlarojcpjw32                                          +----------+--------+--------+--------+-------------------+ +---------+--------+--------+--------+ VertebralPSV cm/sEDV cm/soccluded +---------+--------+--------+--------+   Summary: Right Carotid: Velocities in the right ICA are consistent with a 40-59%                stenosis. Stenosis may be underestimated secondary to bulky                plaque and vessel tortuosity. Left Carotid: Velocities in the left ICA are consistent with a 40-59% stenosis.               Stenosis may be underestimated secondary to bulky plaque and               vessel tortuosity. Vertebrals:  Right vertebral artery demonstrates antegrade flow. Left vertebral              artery demonstrates an occlusion. Subclavians: Normal flow hemodynamics were seen in bilateral subclavian              arteries. *See table(s) above for measurements and observations.  Electronically signed by Lonni Gaskins MD on 03/15/2023 at 3:19:04 PM.    Final    MR ANGIO NECK WO CONTRAST Result Date: 03/14/2023 CLINICAL DATA:  Acute neurologic deficit EXAM: MRA NECK WITHOUT CONTRAST TECHNIQUE: Angiographic images  of the neck were acquired using MRA technique without intravenous contrast. Carotid stenosis measurements (when applicable) are obtained utilizing NASCET criteria, using  the distal internal carotid diameter as the denominator. COMPARISON:  None Available. FINDINGS: Severely technically degraded MRA. There is no flow related enhancement in any vessel, which is likely artifactual. There is a 3 vessel aortic arch branching pattern. The study is essentially nondiagnostic. IMPRESSION: Severely technically degraded MRA, essentially nondiagnostic. Electronically Signed   By: Franky Stanford M.D.   On: 03/14/2023 21:41   MR ANGIO HEAD WO CONTRAST Result Date: 03/14/2023 CLINICAL DATA:  Acute neurologic deficit EXAM: MRA HEAD WITHOUT CONTRAST TECHNIQUE: Angiographic images of the Circle of Willis were acquired using MRA technique without intravenous contrast. COMPARISON:  None Available. FINDINGS: POSTERIOR CIRCULATION: Vertebral arteries are normal. No proximal occlusion of the anterior or inferior cerebellar arteries. Basilar artery is diminutive distally. Superior cerebellar arteries are normal. Posterior cerebral arteries are normal. ANTERIOR CIRCULATION: Intracranial internal carotid arteries are normal. Anterior cerebral arteries are normal. Middle cerebral arteries are normal. Anatomic Variants: Fetal predominant origins of the posterior cerebral arteries. IMPRESSION: Normal intracranial MRA. Electronically Signed   By: Franky Stanford M.D.   On: 03/14/2023 21:23   ECHOCARDIOGRAM COMPLETE Result Date: 03/14/2023    ECHOCARDIOGRAM REPORT   Patient Name:   AYUSH BOULET Date of Exam: 03/14/2023 Medical Rec #:  [id number]     Height:       71.0 in Accession #:    [id number]    Weight:       203.0 lb Date of Birth:  07/31/1928    BSA:          2.122 m Patient Age:    89 years      BP:           130/99 mmHg Patient Gender: M             HR:           75 bpm. Exam Location:  Inpatient Procedure: 2D Echo, Color Doppler,  Cardiac Doppler and Intracardiac            Opacification Agent Indications:    Stroke I63.9  History:        Patient has prior history of Echocardiogram examinations, most                 recent 07/26/2019.  Sonographer:    Tinnie Gosling RDCS Referring Phys: [id number] ARSHAD N KAKRAKANDY IMPRESSIONS  1. Left ventricular ejection fraction, by estimation, is 65 to 70%. Left ventricular ejection fraction by PLAX is 66 %. The left ventricle has normal function. The left ventricle has no regional wall motion abnormalities. There is mild left ventricular hypertrophy. Left ventricular diastolic function could not be evaluated.  2. Right ventricular systolic function is mildly reduced. The right ventricular size is normal.  3. Left atrial size was mildly dilated.  4. The mitral valve is abnormal. Trivial mitral valve regurgitation.  5. The aortic valve is tricuspid. There is mild calcification of the aortic valve. Aortic valve regurgitation is trivial. Aortic valve sclerosis/calcification is present, without any evidence of aortic stenosis.  6. Aortic dilatation noted. There is borderline dilatation of the ascending aorta, measuring 38 mm.  7. Rhythm strip during this exam demonstrates atrial fibrillation. Comparison(s): Prior images unable to be directly viewed, comparison made by report only. Changes from prior study are noted. 07/26/2019: LVEF 55-60%, normal RV systolic function. FINDINGS  Left Ventricle: Left ventricular ejection fraction, by estimation, is 65 to 70%. Left ventricular ejection fraction by PLAX is 66 %. The left ventricle has normal function. The left ventricle  has no regional wall motion abnormalities. The left ventricular internal cavity size was normal in size. There is mild left ventricular hypertrophy. Left ventricular diastolic function could not be evaluated due to atrial fibrillation. Left ventricular diastolic function could not be evaluated. Right Ventricle: The right ventricular size is normal. No  increase in right ventricular wall thickness. Right ventricular systolic function is mildly reduced. Left Atrium: Left atrial size was mildly dilated. Right Atrium: Right atrial size was normal in size. Pericardium: There is no evidence of pericardial effusion. Mitral Valve: The mitral valve is abnormal. Mild mitral annular calcification. Trivial mitral valve regurgitation. Tricuspid Valve: The tricuspid valve is grossly normal. Tricuspid valve regurgitation is mild. Aortic Valve: The aortic valve is tricuspid. There is mild calcification of the aortic valve. Aortic valve regurgitation is trivial. Aortic valve sclerosis/calcification is present, without any evidence of aortic stenosis. Pulmonic Valve: The pulmonic valve was normal in structure. Pulmonic valve regurgitation is not visualized. Aorta: Aortic dilatation noted. There is borderline dilatation of the ascending aorta, measuring 38 mm. IAS/Shunts: No atrial level shunt detected by color flow Doppler. EKG: Rhythm strip during this exam demonstrates atrial fibrillation.  LEFT VENTRICLE PLAX 2D LV EF:         Left ventricular ejection fraction by PLAX is 66 %. LVIDd:         5.20 cm LVIDs:         3.30 cm LV PW:         1.20 cm LV IVS:        1.10 cm LVOT diam:     2.40 cm LV SV:         81 LV SV Index:   38 LVOT Area:     4.52 cm  LEFT ATRIUM             Index LA diam:        5.40 cm 2.54 cm/m LA Vol (A2C):   74.1 ml 34.92 ml/m LA Vol (A4C):   73.9 ml 34.83 ml/m LA Biplane Vol: 74.6 ml 35.16 ml/m  AORTIC VALVE LVOT Vmax:   83.60 cm/s LVOT Vmean:  54.700 cm/s LVOT VTI:    0.179 m  AORTA Ao Root diam: 3.30 cm Ao Asc diam:  3.80 cm TRICUSPID VALVE TR Peak grad:   23.6 mmHg TR Vmax:        243.00 cm/s  SHUNTS Systemic VTI:  0.18 m Systemic Diam: 2.40 cm Vinie Maxcy MD Electronically signed by Vinie Maxcy MD Signature Date/Time: 03/14/2023/1:17:21 PM    Final    MR BRAIN WO CONTRAST Result Date: 03/13/2023 CLINICAL DATA:  Neuro deficit, acute, stroke  suspected. Left upper extremity numbness. Difficulty walking. EXAM: MRI HEAD WITHOUT CONTRAST TECHNIQUE: Multiplanar, multiecho pulse sequences of the brain and surrounding structures were obtained without intravenous contrast. COMPARISON:  Head CT 03/13/2023 FINDINGS: Brain: Scattered small acute cortical and subcortical infarcts are present in the posterior right frontal and anterior right parietal lobes (MCA territory). Several chronic microhemorrhages are noted in the cerebral and cerebellar hemispheres. Patchy to confluent T2 hyperintensities in the cerebral white matter bilaterally are nonspecific but compatible with moderate to severe chronic small vessel ischemic disease. Small chronic infarcts are present in the right occipital lobe, both cerebellar hemispheres, right pons, thalami, and bilateral basal ganglia. There is moderate cerebral and cerebellar atrophy. No mass, midline shift, or extra-axial fluid collection is identified. Vascular: Absent flow void in the distal left vertebral artery. Skull and upper cervical spine: Unremarkable bone marrow  signal. Sinuses/Orbits: Bilateral cataract extraction. Opacification of a single posterior left ethmoid air cell. No significant mastoid fluid. Other: None. IMPRESSION: 1. Small acute right frontoparietal infarcts. 2. Moderate to severe chronic small vessel ischemic disease with numerous chronic infarcts. 3. 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 Electronically Signed   By: Dasie Hamburg M.D.   On: 03/13/2023 17:31   CT Head Wo Contrast Result Date: 03/13/2023 CLINICAL DATA:  Neuro deficit, acute, stroke suspected EXAM: CT HEAD WITHOUT CONTRAST TECHNIQUE: Contiguous axial images were obtained from the base of the skull through the vertex without intravenous contrast. RADIATION DOSE REDUCTION: This exam was performed according to the departmental dose-optimization program which includes automated exposure control, adjustment of the mA and/or kV  according to patient size and/or use of iterative reconstruction technique. COMPARISON:  08/05/2022 FINDINGS: Brain: No evidence of acute infarction, hemorrhage, hydrocephalus, extra-axial collection or mass lesion/mass effect. Patchy low-density changes within the periventricular and subcortical white matter most compatible with chronic microvascular ischemic change. Moderate diffuse cerebral volume loss. Vascular: Atherosclerotic calcifications involving the large vessels of the skull base. No unexpected hyperdense vessel. Skull: Normal. Negative for fracture or focal lesion. Sinuses/Orbits: No acute finding. Other: None. IMPRESSION: 1. No acute intracranial findings. 2. Chronic microvascular ischemic change and cerebral volume loss. Electronically Signed   By: Mabel Converse D.O.   On: 03/13/2023 11:57   CT CHEST ABDOMEN PELVIS WO CONTRAST Result Date: 03/09/2023 CLINICAL DATA:  Two day history of abdominal pain with emesis associated with dysphagia and questionable chest pain. History of prostate cancer. * Tracking Code: BO * EXAM: CT CHEST, ABDOMEN AND PELVIS WITHOUT CONTRAST TECHNIQUE: Multidetector CT imaging of the chest, abdomen and pelvis was performed following the standard protocol without IV contrast. RADIATION DOSE REDUCTION: This exam was performed according to the departmental dose-optimization program which includes automated exposure control, adjustment of the mA and/or kV according to patient size and/or use of iterative reconstruction technique. COMPARISON:  Same day chest radiograph, CT chest dated 08/05/2022, nuclear medicine PET dated 06/23/2022, CT abdomen and pelvis dated 05/22/2021 FINDINGS: CT CHEST FINDINGS Cardiovascular: Multichamber cardiomegaly. Curvilinear hypoattenuation at the left ventricular apex. No significant pericardial fluid/thickening. Great vessels are normal in course and caliber. Coronary artery calcifications. Mediastinum/Nodes: Imaged thyroid  gland without nodules  meeting criteria for imaging follow-up by size. Mildly patulous, fluid-filled upper esophagus. No pathologically enlarged axillary, supraclavicular, mediastinal, or hilar lymph nodes. Lungs/Pleura: The central airways are patent. Increased conspicuity of lower lobe predominant subpleural reticulations. No substantial traction bronchiectasis or architectural distortion. No focal consolidation. No pneumothorax. Trace bilateral pleural effusions. Musculoskeletal: No definite focal osseous abnormality associated with the area of radiotracer uptake in the right anterior fourth rib. Multilevel degenerative changes of the thoracic spine. Median sternotomy wires are nondisplaced. CT ABDOMEN PELVIS FINDINGS Hepatobiliary: No focal hepatic lesions. No intra or extrahepatic biliary ductal dilation. Cholecystectomy. Pancreas: No focal lesions or main ductal dilation. Spleen: Normal in size without focal abnormality. Adrenals/Urinary Tract: No adrenal nodules. Atrophic bilateral kidneys. No suspicious renal mass on this noncontrast enhanced examination , calculi, or hydronephrosis. No focal bladder wall thickening. Stomach/Bowel: Normal appearance of the stomach. Cecum is again located in the right upper quadrant. No abnormal bowel dilation. Circumferential mural thickening of the anorectal junction. Colonic diverticulosis without acute diverticulitis. Appendix is not discretely seen. Vascular/Lymphatic: Aortic atherosclerosis. No enlarged abdominal or pelvic lymph nodes. Reproductive: Normal prostate gland size. Other: No free fluid, fluid collection, or free air. Musculoskeletal: No acute or abnormal lytic or  blastic osseous findings. Multilevel degenerative changes of the lumbar spine. Postsurgical changes of L1-L5 spinal fixation with increased L3 superior endplate compression deformity compared to 05/22/2021. Small fat containing midline ventral epigastric hernia. IMPRESSION: 1. Circumferential mural thickening of the  anorectal junction, which may be related to underdistention or proctitis. 2. Postsurgical changes of L1-L5 spinal fixation with increased L3 superior endplate compression deformity compared to 05/22/2021. 3. No definite focal osseous abnormality associated with the area of previous radiotracer uptake in the right anterior fourth rib. 4. Curvilinear hypoattenuation at the left ventricular apex, which may represent sequela of prior insult. 5. Trace bilateral pleural effusions. 6.  Aortic Atherosclerosis (ICD10-I70.0). Electronically Signed   By: Limin  Xu M.D.   On: 03/09/2023 10:36   DG Chest 1 View Result Date: 03/09/2023 CLINICAL DATA:  Cough. EXAM: CHEST  1 VIEW COMPARISON:  March 12, 2020. FINDINGS: Stable cardiomediastinal silhouette. Status post coronary bypass graft. Probable some degree of eventration involving the right hemidiaphragm. Mild right basilar atelectasis or infiltrate may be present as well. Bony thorax unremarkable. IMPRESSION: Possible mild right basilar opacity is noted concerning for pneumonia or possibly atelectasis. Followup PA and lateral chest X-ray is recommended in 3-4 weeks following trial of antibiotic therapy to ensure resolution and exclude underlying malignancy. Electronically Signed   By: Lynwood Landy Raddle M.D.   On: 03/09/2023 08:09    Microbiology: Results for orders placed or performed during the hospital encounter of 03/09/23  Resp panel by RT-PCR (RSV, Flu A&B, Covid) Anterior Nasal Swab     Status: None   Collection Time: 03/09/23  7:16 AM   Specimen: Anterior Nasal Swab  Result Value Ref Range Status   SARS Coronavirus 2 by RT PCR NEGATIVE NEGATIVE Final    Comment: (NOTE) SARS-CoV-2 target nucleic acids are NOT DETECTED.  The SARS-CoV-2 RNA is generally detectable in upper respiratory specimens during the acute phase of infection. The lowest concentration of SARS-CoV-2 viral copies this assay can detect is 138 copies/mL. A negative result does not  preclude SARS-Cov-2 infection and should not be used as the sole basis for treatment or other patient management decisions. A negative result may occur with  improper specimen collection/handling, submission of specimen other than nasopharyngeal swab, presence of viral mutation(s) within the areas targeted by this assay, and inadequate number of viral copies(<138 copies/mL). A negative result must be combined with clinical observations, patient history, and epidemiological information. The expected result is Negative.  Fact Sheet for Patients:  bloggercourse.com  Fact Sheet for Healthcare Providers:  seriousbroker.it  This test is no t yet approved or cleared by the [place]  FDA and  has been authorized for detection and/or diagnosis of SARS-CoV-2 by FDA under an Emergency Use Authorization (EUA). This EUA will remain  in effect (meaning this test can be used) for the duration of the COVID-19 declaration under Section 564(b)(1) of the Act, 21 U.S.C.section 360bbb-3(b)(1), unless the authorization is terminated  or revoked sooner.       Influenza A by PCR NEGATIVE NEGATIVE Final   Influenza B by PCR NEGATIVE NEGATIVE Final    Comment: (NOTE) The Xpert Xpress SARS-CoV-2/FLU/RSV plus assay is intended as an aid in the diagnosis of influenza from Nasopharyngeal swab specimens and should not be used as a sole basis for treatment. Nasal washings and aspirates are unacceptable for Xpert Xpress SARS-CoV-2/FLU/RSV testing.  Fact Sheet for Patients: bloggercourse.com  Fact Sheet for Healthcare Providers: seriousbroker.it  This test is not yet approved or cleared by  the [place]  FDA and has been authorized for detection and/or diagnosis of SARS-CoV-2 by FDA under an Emergency Use Authorization (EUA). This EUA will remain in effect (meaning this test can be used) for the  duration of the COVID-19 declaration under Section 564(b)(1) of the Act, 21 U.S.C. section 360bbb-3(b)(1), unless the authorization is terminated or revoked.     Resp Syncytial Virus by PCR NEGATIVE NEGATIVE Final    Comment: (NOTE) Fact Sheet for Patients: bloggercourse.com  Fact Sheet for Healthcare Providers: seriousbroker.it  This test is not yet approved or cleared by the [place]  FDA and has been authorized for detection and/or diagnosis of SARS-CoV-2 by FDA under an Emergency Use Authorization (EUA). This EUA will remain in effect (meaning this test can be used) for the duration of the COVID-19 declaration under Section 564(b)(1) of the Act, 21 U.S.C. section 360bbb-3(b)(1), unless the authorization is terminated or revoked.  Performed at [facility], [address] [place], [place] [address]   Urine Culture     Status: Abnormal   Collection Time: 03/09/23 11:35 AM   Specimen: Urine, Clean Catch  Result Value Ref Range Status   Specimen Description   Final    URINE, CLEAN CATCH Performed at [facility], [address], [place], [place] [address]    Special Requests   Final    NONE Performed at [facility], [address] [place], [place] [address]    Culture >=100,000 COLONIES/mL ESCHERICHIA COLI (A)  Final   Report Status 03/11/2023 FINAL  Final   Organism ID, Bacteria ESCHERICHIA COLI (A)  Final      Susceptibility   Escherichia coli - MIC*    AMPICILLIN <=2 SENSITIVE Sensitive     CEFAZOLIN  <=4 SENSITIVE Sensitive     CEFEPIME <=0.12 SENSITIVE Sensitive     CEFTRIAXONE  <=0.25 SENSITIVE Sensitive     CIPROFLOXACIN  >=4 RESISTANT Resistant     GENTAMICIN <=1 SENSITIVE Sensitive     IMIPENEM <=0.25 SENSITIVE Sensitive     NITROFURANTOIN <=16 SENSITIVE Sensitive     TRIMETH /SULFA  <=20 SENSITIVE Sensitive     AMPICILLIN/SULBACTAM <=2 SENSITIVE  Sensitive     PIP/TAZO <=4 SENSITIVE Sensitive ug/mL    * >=100,000 COLONIES/mL ESCHERICHIA COLI    Labs: CBC: Recent Labs  Lab 03/13/23 1207 03/14/23 0323 03/15/23 0557 03/16/23 0531 03/17/23 0614 03/18/23 0610 03/19/23 0631  WBC 7.6   < > 8.9 8.1 9.3 10.6* 11.5*  NEUTROABS 4.3  --   --   --   --   --   --   HGB 9.7*   < > 10.1* 9.9* 9.3* 9.3* 9.5*  HCT 30.2*   < > 30.9* 29.7* 28.7* 28.5* 28.7*  MCV 94.7   < > 92.2 92.0 92.6 92.5 91.7  PLT 294   < > 307 294 294 289 293   < > = values in this interval not displayed.   Basic Metabolic Panel: Recent Labs  Lab 03/15/23 0944 03/16/23 0531 03/17/23 0614 03/18/23 0610 03/19/23 0631  NA 135 136 137 135 135  K 4.0 4.2 4.2 3.7 3.8  CL 102 101 104 106 105  CO2 21* 23 23 21* 20*  GLUCOSE 114* 85 91 95 92  BUN 16 19 18 22  24*  CREATININE 2.01* 1.98* 2.06* 2.19* 2.05*  CALCIUM  9.2 8.7* 8.7* 8.6* 8.4*  MG 2.0  --   --   --   --   PHOS 3.6  --   --   --   --  Liver Function Tests: Recent Labs  Lab 03/15/23 0944  ALBUMIN 3.5   CBG: Recent Labs  Lab 03/13/23 1115 03/15/23 0911  GLUCAP 102* 99    Discharge time spent: greater than 30 minutes.  Signed: Myles Tavella , MD [facility] 03/19/2023

## 2023-03-20 DIAGNOSIS — M19072 Primary osteoarthritis, left ankle and foot: Secondary | ICD-10-CM | POA: Diagnosis not present

## 2023-03-20 DIAGNOSIS — I63511 Cerebral infarction due to unspecified occlusion or stenosis of right middle cerebral artery: Secondary | ICD-10-CM | POA: Diagnosis not present

## 2023-03-20 DIAGNOSIS — K219 Gastro-esophageal reflux disease without esophagitis: Secondary | ICD-10-CM

## 2023-03-20 DIAGNOSIS — I10 Essential (primary) hypertension: Secondary | ICD-10-CM | POA: Diagnosis not present

## 2023-03-20 DIAGNOSIS — I4891 Unspecified atrial fibrillation: Secondary | ICD-10-CM | POA: Diagnosis not present

## 2023-03-20 NOTE — Plan of Care (Signed)
  Problem: RH Balance Goal: LTG: Patient will maintain dynamic sitting balance (OT) Description: LTG:  Patient will maintain dynamic sitting balance with assistance during activities of daily living (OT) Flowsheets (Taken 03/20/2023 1225) LTG: Pt will maintain dynamic sitting balance during ADLs with: Independent with assistive device Goal: LTG Patient will maintain dynamic standing with ADLs (OT) Description: LTG:  Patient will maintain dynamic standing balance with assist during activities of daily living (OT)  Flowsheets (Taken 03/20/2023 1225) LTG: Pt will maintain dynamic standing balance during ADLs with: Contact Guard/Touching assist   Problem: Sit to Stand Goal: LTG:  Patient will perform sit to stand in prep for activites of daily living with assistance level (OT) Description: LTG:  Patient will perform sit to stand in prep for activites of daily living with assistance level (OT) Flowsheets (Taken 03/20/2023 1225) LTG: PT will perform sit to stand in prep for activites of daily living with assistance level: Contact Guard/Touching assist   Problem: RH Grooming Goal: LTG Patient will perform grooming w/assist,cues/equip (OT) Description: LTG: Patient will perform grooming with assist, with/without cues using equipment (OT) Flowsheets (Taken 03/20/2023 1225) LTG: Pt will perform grooming with assistance level of: Supervision/Verbal cueing   Problem: RH Bathing Goal: LTG Patient will bathe all body parts with assist levels (OT) Description: LTG: Patient will bathe all body parts with assist levels (OT) Flowsheets (Taken 03/20/2023 1225) LTG: Pt will perform bathing with assistance level/cueing: Minimal Assistance - Patient > 75%   Problem: RH Dressing Goal: LTG Patient will perform upper body dressing (OT) Description: LTG Patient will perform upper body dressing with assist, with/without cues (OT). Flowsheets (Taken 03/20/2023 1225) LTG: Pt will perform upper body dressing with assistance  level of: Supervision/Verbal cueing Goal: LTG Patient will perform lower body dressing w/assist (OT) Description: LTG: Patient will perform lower body dressing with assist, with/without cues in positioning using equipment (OT) Flowsheets (Taken 03/20/2023 1225) LTG: Pt will perform lower body dressing with assistance level of: Minimal Assistance - Patient > 75%   Problem: RH Toileting Goal: LTG Patient will perform toileting task (3/3 steps) with assistance level (OT) Description: LTG: Patient will perform toileting task (3/3 steps) with assistance level (OT)  Flowsheets (Taken 03/20/2023 1225) LTG: Pt will perform toileting task (3/3 steps) with assistance level: Minimal Assistance - Patient > 75%   Problem: RH Toilet Transfers Goal: LTG Patient will perform toilet transfers w/assist (OT) Description: LTG: Patient will perform toilet transfers with assist, with/without cues using equipment (OT) Flowsheets (Taken 03/20/2023 1225) LTG: Pt will perform toilet transfers with assistance level of: Contact Guard/Touching assist   Problem: RH Tub/Shower Transfers Goal: LTG Patient will perform tub/shower transfers w/assist (OT) Description: LTG: Patient will perform tub/shower transfers with assist, with/without cues using equipment (OT) Flowsheets (Taken 03/20/2023 1225) LTG: Pt will perform tub/shower stall transfers with assistance level of: Minimal Assistance - Patient > 75%   Problem: RH Memory Goal: LTG Patient will demonstrate ability for day to day recall/carry over during activities of daily living with assistance level (OT) Description: LTG:  Patient will demonstrate ability for day to day recall/carry over during activities of daily living with assistance level (OT). Flowsheets (Taken 03/20/2023 1225) LTG:  Patient will demonstrate ability for day to day recall/carry over during activities of daily living with assistance level (OT): Supervision

## 2023-03-20 NOTE — Evaluation (Signed)
 Physical Therapy Assessment and Plan  Patient Details  Name: Brandon Harris MRN: [id number] Date of Birth: 04/28/1928  PT Diagnosis: Abnormal posture, Abnormality of gait, Difficulty walking, Hemiplegia dominant, and Muscle weakness Rehab Potential: Good ELOS: 14-21 days   Today's Date: 03/20/2023 PT Individual Time: 1250-1400 PT Individual Time Calculation (min): 70 min    Hospital Problem: Principal Problem:   Right middle cerebral artery stroke [facility])   Past Medical History:  Past Medical History:  Diagnosis Date   Arthritis    Carotid arterial disease (HCC)    Chronic kidney disease (CKD), stage IV (severe) (HCC)    Diverticulosis    colonic diverticulosis   Gallstones    GERD (gastroesophageal reflux disease)    Hypertension    Myocardial infarction (HCC)    Prostate cancer (HCC)    Status post coronary artery bypass grafting     intraoperative cholangiogram   Stroke [facility])    patient reports no deficits    Urinary incontinence    bladder leakage -  patient wears a pad   Past Surgical History:  Past Surgical History:  Procedure Laterality Date   BACK SURGERY     x 4, has titanium in back per patient    CHOLECYSTECTOMY, LAPAROSCOPIC     CORONARY ARTERY BYPASS GRAFT  2001   CRYOTHERAPY     Prostate Cancer   LEFT HEART CATH AND CORS/GRAFTS ANGIOGRAPHY N/A 10/27/2017   Procedure: LEFT HEART CATH AND CORS/GRAFTS ANGIOGRAPHY;  Surgeon: Claudene Victory ORN, MD;  Location: [facility] INVASIVE CV LAB;  Service: Cardiovascular;  Laterality: N/A;   prostate cancer     ROTATOR CUFF REPAIR     LEFT   TOTAL KNEE ARTHROPLASTY Right 12/18/2014   Procedure: TOTAL RIGHT KNEE ARTHROPLASTY;  Surgeon: Donnice Car, MD;  Location: [facility] ORS;  Service: Orthopedics;  Laterality: Right;    Assessment & Plan Clinical Impression: Patient is a 89 year old right-handed male with history significant for CAD/CABG 2001 maintained on low-dose aspirin , CKD stage IV, history of prior CVA, hypertension,  mesenteric artery stenosis followed by [facility], history of prostate cancer, right total knee arthroplasty 2016, back surgery x 4. Per chart review patient lives with his daughter. 1 level home with a ramped entrance. Ambulatory with a rollator. He still drives to church. Patient recently came to the ER 4 days ago because of abdominal pain at that time CT chest abdomen pelvis showed circumferential thickening at the anorectal junction distention versus proctitis, diverticulosis, right upper quadrant. He was discharged to home on Augmentin  per GI services.. Presented 03/13/2023 with persistent left upper and lower extremity numbness and weakness. Admission chemistries unremarkable except sodium 133, glucose 117 creatinine 2.26, hemoglobin 9.7, lactic acid unremarkable, urinalysis negative nitrite. CT/MRI showed small acute right frontal parietal infarcts. Moderate to severe chronic small vessel ischemic disease with numerous chronic infarcts. MRA of the head unremarkable. MRA of the neck showed severely technically degraded MRA essentially nondiagnostic. Patient did not receive tPA. He was found to be in A-fib with RVR and cardiology services consulted. Placed on IV heparin  transition to Eliquis . Echocardiogram ejection fraction of 65 to 70%. No wall motion abnormalities. Plans to follow-up outpatient considering DCCV if needed. Tolerating a regular consistency diet. Therapy evaluations completed due to patient decreased functional ability left side weakness was admitted for a comprehensive rehab program.Patient transferred to CIR on 03/19/2023 .   Patient currently requires [place] with mobility secondary to muscle weakness, decreased cardiorespiratoy endurance, ataxia, decreased coordination, and decreased motor planning, decreased visual  perceptual skills, decreased attention to left and decreased motor planning, decreased attention, decreased awareness, decreased problem solving, decreased safety awareness, and decreased  memory, central origin, and decreased sitting balance, decreased standing balance, decreased postural control, hemiplegia, and decreased balance strategies.  Prior to hospitalization, patient was independent  with mobility and lived with Daughter in a House home.  Home access is 3 in garageStairs to enter, Ramped entrance.  Patient will benefit from skilled PT intervention to maximize safe functional mobility, minimize fall risk, and decrease caregiver burden for planned discharge home with 24 hour supervision.  Anticipate patient will benefit from follow up HH at discharge.  PT - End of Session Activity Tolerance: Tolerates 30+ min activity with multiple rests Endurance Deficit: Yes Endurance Deficit Description: significant endurance deficit with increased rest breaks required during session PT Assessment Rehab Potential (ACUTE/IP ONLY): Good PT Barriers to Discharge: Other (comments) PT Barriers to Discharge Comments: new L ankle pain restricting weight bearing PT Patient demonstrates impairments in the following area(s): Balance;Edema;Endurance;Motor;Pain;Safety PT Transfers Functional Problem(s): Bed Mobility;Bed to Chair;Car PT Locomotion Functional Problem(s): Ambulation;Stairs PT Plan PT Intensity: Minimum of 1-2 x/day ,45 to 90 minutes PT Frequency: 5 out of 7 days PT Duration Estimated Length of Stay: 14-21 days PT Treatment/Interventions: Ambulation/gait training;Community reintegration;DME/adaptive equipment instruction;Neuromuscular re-education;Psychosocial support;Stair training;UE/LE Strength taining/ROM;Balance/vestibular training;Discharge planning;Pain management;Skin care/wound management;Therapeutic Activities;UE/LE Coordination activities;Cognitive remediation/compensation;Disease management/prevention;Functional mobility training;Patient/family education;Therapeutic Exercise PT Transfers Anticipated Outcome(s): CGA + LRAD PT Locomotion Anticipated Outcome(s): CGA +  LRAD PT Recommendation Recommendations for Other Services: None Follow Up Recommendations: Home health PT Patient destination: Home Equipment Recommended: To be determined Equipment Details: has RW and rollator   PT Evaluation Precautions/Restrictions Precautions Precautions: Fall Restrictions Weight Bearing Restrictions Per Provider Order: No General Chart Reviewed: Yes Family/Caregiver Present: Yes Vital SignsTherapy Vitals Temp: 98 F (36.7 C) Temp Source: Oral Pulse Rate: 83 Resp: 17 BP: 127/64 Patient Position (if appropriate): Lying Oxygen Therapy SpO2: 96 % O2 Device: Room Air Pain Pain Assessment Pain Scale: 0-10 Pain Score: 5  Pain Type: Acute pain Pain Location: Neck Pain Descriptors / Indicators: Aching Pain Onset: On-going Patients Stated Pain Goal: 0 Pain Intervention(s): Repositioned;Ambulation/increased activity Multiple Pain Sites: Yes 2nd Pain Site Pain Score: 10 Pain Type: Acute pain Pain Location: Ankle Pain Orientation: Left Pain Descriptors / Indicators: Aching Pain Frequency: Constant Pain Onset: With Activity Patient's Stated Pain Goal: 0 Pain Intervention(s): Relaxation Pain Interference Pain Interference Pain Effect on Sleep: 2. Occasionally Pain Interference with Therapy Activities: 2. Occasionally Pain Interference with Day-to-Day Activities: 2. Occasionally Home Living/Prior Functioning Home Living Available Help at Discharge: Family;Available 24 hours/day Type of Home: House Home Access: Stairs to enter;Ramped entrance Entrance Stairs-Number of Steps: 3 in garage Entrance Stairs-Rails: Right Home Layout: One level Bathroom Shower/Tub: Tub/shower unit;Walk-in shower Bathroom Toilet: Standard Bathroom Accessibility: Yes Additional Comments: Patient reports he prefers to take tub baths and has grab bars he uses to get in and out of the tub.  Lives With: Daughter Prior Function Level of Independence: Independent with  gait;Requires assistive device for independence Driving: Yes Vocation: Retired Vision/Perception  Vision - History Ability to See in Adequate Light: 0 Adequate Vision - Assessment Eye Alignment: Within Functional Limits Ocular Range of Motion: Impaired-to be further tested in functional context Alignment/Gaze Preference: Within Defined Limits Tracking/Visual Pursuits: Decreased smoothness of horizontal tracking;Decreased smoothness of vertical tracking;Impaired - to be further tested in functional context (very slowed visual tracking) Saccades: Additional eye shifts occurred during testing Convergence: Impaired (comment) (both eyes slow  to converge, no double vision reported) Perception Perception: Impaired Preception Impairment Details: Inattention/Neglect Perception-Other Comments: Mild L inattention Praxis Praxis: Not tested  Cognition Overall Cognitive Status: Impaired/Different from baseline Arousal/Alertness: Awake/alert Orientation Level: Oriented X4 Attention: Selective;Alternating Selective Attention: Appears intact Alternating Attention Impairment: Verbal basic;Functional basic Memory: Impaired Memory Impairment: Decreased recall of new information Awareness: Appears intact Problem Solving: Impaired Problem Solving Impairment: Verbal basic;Functional basic Executive Function: Self Correcting Self Correcting: Impaired Safety/Judgment: Impaired Sensation Sensation Light Touch: Appears Intact Coordination Gross Motor Movements are Fluid and Coordinated: No Coordination and Movement Description: decreased 2/2 B LE weakeness and fatigue Motor  Motor Motor: Hemiplegia;Ataxia;Abnormal postural alignment and control Motor - Skilled Clinical Observations: Mild ataxia and L hemiplegia, laterpolsion with Pt pushing to the L in standing   Trunk/Postural Assessment  Cervical Assessment Cervical Assessment: Exceptions to [facility] (forward head) Thoracic Assessment Thoracic  Assessment: Exceptions to [facility] (kyphosis) Lumbar Assessment Lumbar Assessment: Exceptions to [facility] (posterior pelvic tilt) Postural Control Postural Control: Deficits on evaluation Trunk Control: decreased Protective Responses: delayed  Balance Balance Balance Assessed: Yes Static Sitting Balance Static Sitting - Balance Support: Feet unsupported Static Sitting - Level of Assistance: 5: Stand by assistance Dynamic Sitting Balance Dynamic Sitting - Balance Support: Feet unsupported Dynamic Sitting - Level of Assistance: 4: Min assist Sitting balance - Comments: Able to reach to feet in chair with no difficulty - supervision for edge of chair sitting Static Standing Balance Static Standing - Balance Support: During functional activity Static Standing - Level of Assistance: 3: Mod assist Dynamic Standing Balance Dynamic Standing - Balance Support: During functional activity Dynamic Standing - Level of Assistance: 2: Jamey assist Extremity Assessment  RUE Assessment RUE Assessment: Exceptions to [facility] General Strength Comments: Limited shoulder flexion d/t previous rotator cuff injury, 4/5 grossly for grip and elbow flexion LUE Assessment LUE Assessment: Exceptions to [facility] General Strength Comments: Limited shoulder ROM and strength d/t previous rotator cuff injury, strength 3-/5 grossly RLE Assessment RLE Assessment: Exceptions to [facility] General Strength Comments: gross 4/5 LLE Assessment LLE Assessment: Exceptions to [facility] General Strength Comments: gros 3+/5  Care Tool Care Tool Bed Mobility Roll left and right activity   Roll left and right assist level: Moderate Assistance - Patient 50 - 74%    Sit to lying activity   Sit to lying assist level: Moderate Assistance - Patient 50 - 74%    Lying to sitting on side of bed activity   Lying to sitting on side of bed assist level: the ability to move from lying on the back to sitting on the side of the bed with no back support.: Maximal  Assistance - Patient 25 - 49%     Care Tool Transfers Sit to stand transfer   Sit to stand assist level: Maximal Assistance - Patient 25 - 49%    Chair/bed transfer   Chair/bed transfer assist level: Maximal Assistance - Patient 25 - 49%    Car transfer Car transfer activity did not occur: Safety/medical concerns (limited 2/2 high ankle pain)        Care Tool Locomotion Ambulation Ambulation activity did not occur: Safety/medical concerns (limited 2/2 high ankle pain)        Walk 10 feet activity Walk 10 feet activity did not occur: Safety/medical concerns (limited 2/2 high ankle pain)       Walk 50 feet with 2 turns activity Walk 50 feet with 2 turns activity did not occur: Safety/medical concerns (limited 2/2 high ankle pain)  Walk 150 feet activity Walk 150 feet activity did not occur: Safety/medical concerns (limited 2/2 high ankle pain)      Walk 10 feet on uneven surfaces activity Walk 10 feet on uneven surfaces activity did not occur: Safety/medical concerns (limited 2/2 high ankle pain)      Stairs Stair activity did not occur: Safety/medical concerns (limited 2/2 high ankle pain)        Walk up/down 1 step activity Walk up/down 1 step or curb (drop down) activity did not occur: Safety/medical concerns (limited 2/2 high ankle pain)      Walk up/down 4 steps activity Walk up/down 4 steps activity did not occur: Safety/medical concerns (limited 2/2 high ankle pain)      Walk up/down 12 steps activity Walk up/down 12 steps activity did not occur: Safety/medical concerns (limited 2/2 high ankle pain)      Pick up small objects from floor Pick up small object from the floor (from standing position) activity did not occur: Safety/medical concerns (limited 2/2 high ankle pain)      Wheelchair Is the patient using a wheelchair?: Yes Type of Wheelchair: Manual   Wheelchair assist level: Dependent - Patient 0% Brandon Harris wheelchair distance: 390ft  Wheel 50 feet with 2  turns activity   Assist Level: Dependent - Patient 0%  Wheel 150 feet activity   Assist Level: Dependent - Patient 0%    Refer to Care Plan for Long Term Goals  SHORT TERM GOAL WEEK 1 PT Short Term Goal 1 (Week 1): Pt will transfer supine > sit  using minA PT Short Term Goal 2 (Week 1): Pt will complete sit to stand using minA + LRAD PT Short Term Goal 3 (Week 1): Pt will maintain dynamic standing balance using CGA PT Short Term Goal 4 (Week 1): Pt will complete amb of 64ft using MinA + LRAD PT Short Term Goal 5 (Week 1): Pt will initiate stair training  Recommendations for other services: None   Skilled Therapeutic Intervention Mobility Bed Mobility Bed Mobility: Rolling Right;Rolling Left;Supine to Sit;Sit to Supine;Sitting - Scoot to Edge of Bed Rolling Right: Moderate Assistance - Patient 50-74% Rolling Left: Moderate Assistance - Patient 50-74% Supine to Sit: Moderate Assistance - Patient 50-74% Sitting - Scoot to Edge of Bed: Moderate Assistance - Patient 50-74% Sit to Supine: Moderate Assistance - Patient 50-74% Transfers Transfers: Sit to Stand;Stand to Sit;Stand Pivot Transfers Sit to Stand: Maximal Assistance - Patient 25-49% Stand to Sit: Maximal Assistance - Patient 25-49% Stand Pivot Transfers: Maximal Assistance - Patient 25 - 49% Stand Pivot Transfer Details: Verbal cues for safe use of DME/AE;Manual facilitation for weight shifting;Verbal cues for precautions/safety;Verbal cues for gait pattern;Verbal cues for technique Transfer (Assistive device): Rolling walker Locomotion  Gait Ambulation: No Gait Gait: No Stairs / Additional Locomotion Stairs: No Ramp: Dependent - Patient 0% Curb: Dependent - Patient 0% Wheelchair Mobility Wheelchair Mobility: No  Session Note: Chart reviewed and pt agreeable to therapy. Pt received seated in recliner with c/o pain as described above. Also of note, daughter present t/o session. Session focused on evaluation and  initiation of functional transfers to promote safe home access. Pt initiated session with review of function as described above. Pt then completed blocked practice of sit to stand in STEDY using minA + support to place BUE on STEDY. Pt required modA to return to bed. Session education emphasized therapy goals, role of PT, care continuum, and CIR fall prevention. At end of session, pt was left semi-reclined in  bed with alarm engaged, nurse call bell and all needs in reach.    Discharge Criteria: Patient will be discharged from PT if patient refuses treatment 3 consecutive times without medical reason, if treatment goals not met, if there is a change in medical status, if patient makes no progress towards goals or if patient is discharged from hospital.  The above assessment, treatment plan, treatment alternatives and goals were discussed and mutually agreed upon: by patient and by family  Kelcey Korus G Laurelle Skiver 03/20/2023, 2:35 PM

## 2023-03-20 NOTE — Progress Notes (Addendum)
 PROGRESS NOTE   Subjective/Complaints: Pt had a fair night. Still having LLE pain at ankle, foot. I reviewed his ankle xrays. Otherwise didn't have any complaints other than wanting to be able to move better.   ROS: Patient denies fever, rash, sore throat, blurred vision, dizziness, nausea, vomiting, diarrhea, cough, shortness of breath or chest pain, joint or back/neck pain, headache, or mood change.    Objective:   DG Ankle 2 Views Left Result Date: 03/19/2023 CLINICAL DATA:  Left ankle pain and swelling. EXAM: LEFT ANKLE - 2 VIEW COMPARISON:  None Available. FINDINGS: There is no evidence of an acute fracture, dislocation, or joint effusion. A chronic appearing deformity is seen involving the base of the fifth left metatarsal. Mild to moderate severity degenerative changes are seen involving the tibiotalar joint. There is marked severity vascular calcification. Moderate severity soft tissue swelling is seen along the left heel and plantar aspect of the proximal to mid left foot. A 4 mm soft tissue ulceration is also suspected within the region posterior to the left calcaneus. IMPRESSION: 1. Moderate severity soft tissue swelling along the left heel and plantar aspect of the proximal to mid left foot. 2. Mild to moderate severity degenerative changes involving the tibiotalar joint. Electronically Signed   By: Suzen Dials M.D.   On: 03/19/2023 19:58   Recent Labs    03/18/23 0610 03/19/23 0631  WBC 10.6* 11.5*  HGB 9.3* 9.5*  HCT 28.5* 28.7*  PLT 289 293   Recent Labs    03/18/23 0610 03/19/23 0631  NA 135 135  K 3.7 3.8  CL 106 105  CO2 21* 20*  GLUCOSE 95 92  BUN 22 24*  CREATININE 2.19* 2.05*  CALCIUM  8.6* 8.4*    Intake/Output Summary (Last 24 hours) at 03/20/2023 9167 Last data filed at 03/20/2023 0813 Gross per 24 hour  Intake 480 ml  Output 850 ml  Net -370 ml        Physical Exam: Vital Signs Blood  pressure (!) 148/76, pulse 95, temperature 98 F (36.7 C), temperature source Oral, resp. rate 18, height 5' 11 (1.803 m), weight 80.9 kg, SpO2 97%.  General: Alert and oriented, No apparent distress HEENT: Head is normocephalic, atraumatic, PERRLA, EOMI, sclera anicteric, oral mucosa pink and moist, dentition intact, ext ear canals clear,  Neck: Supple without JVD or lymphadenopathy Heart: IRR/IRR. No murmurs rubs or gallops Chest: CTA bilaterally without wheezes, rales, or rhonchi; no distress Abdomen: Soft, non-tender, non-distended, bowel sounds positive. Extremities: No clubbing, cyanosis, or edema. Pulses are 2+ Psych: Pt's affect is appropriate. Pt is cooperative Skin: Clean and intact without signs of breakdown Neuro:  pt is fairly alert, oriented. Fair insight and awareness. Speech still dysarthric. Normal language. CN notable for left CN VII, left inattetion. Shoulders both limited by ortho. RUE otherwise is 4 to 4+/5 and LUE is 3+ to 4/5. RLE is 4/5. LLE is 3 to 3+/5 prox to distal. Sl decrease in LT left arm and leg. No frank limb ataxia or cerebellar signs. No abnormal tone appreciaed.  Musculoskeletal: both shoulders limited in PROM/AROM.  Left ankle, heel tender with palpation and ROM. Mild effusion about medial malleolus.  Assessment/Plan: 1. Functional deficits which require 3+ hours per day of interdisciplinary therapy in a comprehensive inpatient rehab setting. Physiatrist is providing close team supervision and 24 hour management of active medical problems listed below. Physiatrist and rehab team continue to assess barriers to discharge/monitor patient progress toward functional and medical goals  Care Tool:  Bathing              Bathing assist       Upper Body Dressing/Undressing Upper body dressing        Upper body assist      Lower Body Dressing/Undressing Lower body dressing            Lower body assist       Toileting Toileting     Toileting assist       Transfers Chair/bed transfer  Transfers assist           Locomotion Ambulation   Ambulation assist              Walk 10 feet activity   Assist           Walk 50 feet activity   Assist           Walk 150 feet activity   Assist           Walk 10 feet on uneven surface  activity   Assist           Wheelchair     Assist               Wheelchair 50 feet with 2 turns activity    Assist            Wheelchair 150 feet activity     Assist          Blood pressure (!) 148/76, pulse 95, temperature 98 F (36.7 C), temperature source Oral, resp. rate 18, height 5' 11 (1.803 m), weight 80.9 kg, SpO2 97%.  Medical Problem List and Plan: 1. Functional deficits secondary to right MCA cortical several small infarcts likely secondary to newly diagnosed A-fib RVR             -patient may  shower             -ELOS/Goals: 11-15 days, supervision to mod I goals with PT, OT, SLP  -Patient is beginning CIR therapies today including PT, OT, and SLP  2.  Antithrombotics: -DVT/anticoagulation:  Pharmaceutical: Eliquis              -antiplatelet therapy: N/A 3. 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 Pt c/o pain in this area             -now with pain and sl swelling around left ankle without known injury             - xray of left ankle with arthritic changes tib-talar joint and soft tissue swelling of heel and plantar foot             -continue voltaren  gel to right chest wall and ankle TID.   -needs appropriate shoewear when up with therapy             -observe for activity tolerance. Might benefit from air cast 4. Mood/Behavior/Sleep: Remeron  15 mg nightly.  Xanax  0.25 mg nightly as needed             -antipsychotic agents: N/A 5. Neuropsych/cognition: This patient is  capable of making decisions on his own behalf. 6. Skin/Wound Care: Routine skin checks 7.  Fluids/Electrolytes/Nutrition: Routine and analysis follow-up chemistries 8.  Newly diagnosed A-fib with RVR.  Continue Eliquis  as well as Coreg  12.5 mg twice daily.  Plan outpatient follow-up consider DCCV  -remains in a fib, rate controlled 9.  CAD/CABG.  Imdur  15 mg daily. 10.  Hypertension.  Monitor with increased mobility.  Hydralazine  and Norvasc  currently on hold due to soft blood pressure and resume as needed  -2/8 fair control, continue with current regimen 11.  History of prostate cancer with recent E. coli UTI/prostatitis.  Complete course of Augmentin  12.  CKD stage IV.  Baseline creatinine 2.0-2.2.  Follow-up chemistries on Monday 13.  GERD/abdominal pain/nausea and vomiting.  Seen by GI recommended empiric PPI daily, Carafate . I see no recs for flagyl ---will dc  - No plans for endoscopic intervention at this time             -no recorded emesis since I saw him in consult earlier this week 14.  Hyperlipidemia.  Lipitor  15.  History of mesenteric artery stenosis.  Follow-up outpatient 16.  Anemia of chronic disease.  Continue iron supplement.  Occult blood negative.  Follow-up CBC Monday    LOS: 1 days A FACE TO FACE EVALUATION WAS PERFORMED  Arthea ONEIDA Gunther 03/20/2023, 8:32 AM

## 2023-03-20 NOTE — Evaluation (Signed)
 Speech Language Pathology Assessment and Plan  Patient Details  Name: Brandon Harris MRN: [id number] Date of Birth: 09-16-1928  SLP Diagnosis: Cognitive Impairments  Rehab Potential: Good ELOS: 11-15 days   Today's Date: 03/20/2023 SLP Individual Time: 9168-9069 SLP Individual Time Calculation (min): 59 min  Hospital Problem: Principal Problem:   Right middle cerebral artery stroke [facility])  Past Medical History:  Past Medical History:  Diagnosis Date   Arthritis    Carotid arterial disease (HCC)    Chronic kidney disease (CKD), stage IV (severe) (HCC)    Diverticulosis    colonic diverticulosis   Gallstones    GERD (gastroesophageal reflux disease)    Hypertension    Myocardial infarction (HCC)    Prostate cancer (HCC)    Status post coronary artery bypass grafting     intraoperative cholangiogram   Stroke [facility])    patient reports no deficits    Urinary incontinence    bladder leakage -  patient wears a pad   Past Surgical History:  Past Surgical History:  Procedure Laterality Date   BACK SURGERY     x 4, has titanium in back per patient    CHOLECYSTECTOMY, LAPAROSCOPIC     CORONARY ARTERY BYPASS GRAFT  2001   CRYOTHERAPY     Prostate Cancer   LEFT HEART CATH AND CORS/GRAFTS ANGIOGRAPHY N/A 10/27/2017   Procedure: LEFT HEART CATH AND CORS/GRAFTS ANGIOGRAPHY;  Surgeon: Claudene Victory ORN, MD;  Location: [facility] INVASIVE CV LAB;  Service: Cardiovascular;  Laterality: N/A;   prostate cancer     ROTATOR CUFF REPAIR     LEFT   TOTAL KNEE ARTHROPLASTY Right 12/18/2014   Procedure: TOTAL RIGHT KNEE ARTHROPLASTY;  Surgeon: Donnice Car, MD;  Location: [facility] ORS;  Service: Orthopedics;  Laterality: Right;    Assessment / Plan / Recommendation Clinical Impression  Brandon Harris is a 89 year old right-handed male with history significant for CAD/CABG 2001 maintained on low-dose aspirin , CKD stage IV, history of prior CVA, hypertension, mesenteric artery stenosis followed by [facility], history  of prostate cancer, right total knee arthroplasty 2016, back surgery x 4. Per chart review patient lives with his daughter. 1 level home with a ramped entrance. Ambulatory with a rollator. He still drives to church. Patient recently came to the ER 4 days ago because of abdominal pain at that time CT chest abdomen pelvis showed circumferential thickening at the anorectal junction distention versus proctitis, diverticulosis, right upper quadrant. He was discharged to home on Augmentin  per GI services.. Presented 03/13/2023 with persistent left upper and lower extremity numbness and weakness. MRA of the head unremarkable. MRA of the neck showed severely technically degraded MRA essentially nondiagnostic. Patient did not receive tPA. He was found to be in A-fib. Tolerating a regular consistency diet.   Patient transferred to CIR on 03/19/2023 .    Pt presents with overall moderate cognitive impairment as evidenced by SLUMS score of 14/30 (improved from 13/30 on 03/15/23). Pt deficits noted in attention, recall (immediate story recall more impaired than delayed recall of 5 words), calculations and mental manipulation. Pt with strengths in orientation and verbal fluency. Pt clock drawing with all numbers on bottom half of circle and in opposite direction (of note, pt with difficulty writing d/t missing pointer finger and old hand injury). Pt and daughter reports patient is close to his cognitive baseline but would like to improve cognitive status to maintain independence at home with basic daily living tasks. Daughter is responsible for high level cognitive tasks  including meals, finances and medication management. Pt is consuming a regular/thin diet without difficulty although PO intake has been minimal. Pt will benefit from skilled ST during CIR stay to increase safety and independence with daily living tasks for home environment.     Skilled Therapeutic Interventions          Pt participating in SLUMS examination and  other informal assessments of speech, language and cognition. Please see above.   SLP Assessment  Patient will need skilled Speech Lanaguage Pathology Services during CIR admission    Recommendations  SLP Diet Recommendations: Age appropriate regular solids;Thin Patient destination: Home Follow up Recommendations: None Equipment Recommended: To be determined    SLP Frequency 3 to 5 out of 7 days   SLP Duration  SLP Intensity  SLP Treatment/Interventions 11-15 days  Minumum of 1-2 x/day, 30 to 90 minutes  Cognitive remediation/compensation;Internal/external aids;Functional tasks;Patient/family education    Pain Pain Assessment Pain Scale: 0-10 Pain Score: 0-No pain  Prior Functioning Cognitive/Linguistic Baseline: Within functional limits Type of Home: House  Lives With: Daughter Available Help at Discharge: Family;Available 24 hours/day Vocation: Retired  Architectural Technologist Overall Cognitive Status: Impaired/Different from baseline Arousal/Alertness: Awake/alert Orientation Level: Oriented X4 Year: 2025 Month: February Day of Week: Correct Attention: Selective;Alternating Selective Attention: Appears intact Alternating Attention: Impaired Alternating Attention Impairment: Verbal basic;Functional basic Memory: Impaired Memory Impairment: Decreased recall of new information Awareness: Appears intact Problem Solving: Impaired Problem Solving Impairment: Verbal basic;Functional basic Executive Function: Self Correcting Self Correcting: Impaired  Comprehension Auditory Comprehension Overall Auditory Comprehension: Appears within functional limits for tasks assessed Visual Recognition/Discrimination Discrimination: Within Function Limits Reading Comprehension Reading Status: Not tested Expression Expression Primary Mode of Expression: Verbal Verbal Expression Overall Verbal Expression: Appears within functional limits for tasks assessed Written  Expression Dominant Hand: Right Written Expression: Exceptions to [facility] (missing pointer finger and other hand trauma from 1960s word accident) Oral Motor Motor Speech Overall Motor Speech: Appears within functional limits for tasks assessed  Care Tool Care Tool Cognition Ability to hear (with hearing aid or hearing appliances if normally used Ability to hear (with hearing aid or hearing appliances if normally used): 1. Minimal difficulty - difficulty in some environments (e.g. when person speaks softly or setting is noisy)   Expression of Ideas and Wants Expression of Ideas and Wants: 3. Some difficulty - exhibits some difficulty with expressing needs and ideas (e.g, some words or finishing thoughts) or speech is not clear   Understanding Verbal and Non-Verbal Content Understanding Verbal and Non-Verbal Content: 3. Usually understands - understands most conversations, but misses some part/intent of message. Requires cues at times to understand  Memory/Recall Ability Memory/Recall Ability : Current season;That he or she is in a hospital/hospital unit   Short Term Goals: Week 1: SLP Short Term Goal 1 (Week 1): Pt will recall novel, functional information with 75% accuracy provided min-mod A cues SLP Short Term Goal 2 (Week 1): Pt will solve mildly complex problems with 75% accuracy provided min A cues SLP Short Term Goal 3 (Week 1): Pt will participate in alternating attention tasks during functional activities with 70% accuracy provided min-mod A cues  Refer to Care Plan for Long Term Goals  Recommendations for other services: None   Discharge Criteria: Patient will be discharged from SLP if patient refuses treatment 3 consecutive times without medical reason, if treatment goals not met, if there is a change in medical status, if patient makes no progress towards goals or if patient is discharged  from hospital.  The above assessment, treatment plan, treatment alternatives and goals were  discussed and mutually agreed upon: by patient  Andriette JONELLE Rattler 03/20/2023, 9:25 AM

## 2023-03-20 NOTE — Progress Notes (Signed)
 Inpatient Rehabilitation Admission Medication Review by a Pharmacist  A complete drug regimen review was completed for this patient to identify any potential clinically significant medication issues.  High Risk Drug Classes Is patient taking? Indication by Medication  Antipsychotic No   Anticoagulant Yes Apixaban  - atrial fibrillation and CVA prophylaxis  Antibiotic Yes Augmentin  - proctitis, E coli UTI/ prostatitis  Opioid No   Antiplatelet No   Hypoglycemics/insulin No   Vasoactive Medication Yes Carvedilol  - atrial fibrillation, hypertension Imdur  - CAD, hypertension  Chemotherapy No   Other Yes Atorvastatin  - hyperlipidemia Diclofenac  gel - topical pain relief Ferrous sulfate  - supplement Mirtazapine  - mood, sleep Pantoprazole , Sucralfate  - GERD  PRNs: Acetaminophen  - mild pain or temp > 99.43F Alprazolam  - anxiety, sleep     Type of Medication Issue Identified Description of Issue Recommendation(s)  Drug Interaction(s) (clinically significant)     Duplicate Therapy     Allergy     No Medication Administration End Date     Incorrect Dose     Additional Drug Therapy Needed     Significant med changes from prior encounter (inform family/care partners about these prior to discharge). New Apixaban , sucralfate , diclofenac  gel. Imdur  dose decreased.  STOP: aspirin , amitriptylline, amlodipine , hydralazine , melatonin, Flomax , prn tramadol . Communicate medication changes with patient/family prior to discharge.   Monitoring blood pressure for any need to resume prior meds.  Other  Discharge note includes plan to resume prior Zolpidem .  Currently using PRN Alprazolam  for sleep.    Clinically significant medication issues were identified that warrant physician communication and completion of prescribed/recommended actions by midnight of the next day:  No  Pharmacist comments:  - Dr. Babs reviewed antibiotics. Metronidazole  discontinued. To complete Augmentin   course.  Time spent performing this drug regimen review (minutes): 30   Thank you for allowing pharmacy to be a part of this patient's care.   Genaro Zebedee Calin, RPh 03/20/2023 1:11 PM

## 2023-03-20 NOTE — Plan of Care (Signed)
  Problem: RH Problem Solving Goal: LTG Patient will demonstrate problem solving for (SLP) Description: LTG:  Patient will demonstrate problem solving for basic/complex daily situations with cues  (SLP) Flowsheets (Taken 03/20/2023 0930) LTG: Patient will demonstrate problem solving for (SLP): Basic daily situations LTG Patient will demonstrate problem solving for: Supervision   Problem: RH Memory Goal: LTG Patient will demonstrate ability for day to day (SLP) Description: LTG:   Patient will demonstrate ability for day to day recall/carryover during cognitive/linguistic activities with assist  (SLP) Flowsheets (Taken 03/20/2023 0930) LTG: Patient will demonstrate ability for day to day recall: New information LTG: Patient will demonstrate ability for day to day recall/carryover during cognitive/linguistic activities with assist (SLP): Supervision   Problem: RH Attention Goal: LTG Patient will demonstrate this level of attention during functional activites (SLP) Description: LTG:  Patient will will demonstrate this level of attention during functional activites (SLP) Flowsheets (Taken 03/20/2023 0930) Patient will demonstrate during cognitive/linguistic activities the attention type of: Alternating Patient will demonstrate this level of attention during cognitive/linguistic activities in: Home LTG: Patient will demonstrate this level of attention during cognitive/linguistic activities with assistance of (SLP): Supervision

## 2023-03-20 NOTE — Evaluation (Signed)
 Occupational Therapy Assessment and Plan  Patient Details  Name: Brandon Harris MRN: [id number] Date of Birth: Jun 11, 1928  OT Diagnosis: ataxia, hemiplegia affecting non-dominant side, muscular wasting and disuse atrophy, and pain in joint Rehab Potential: Rehab Potential (ACUTE ONLY): Good ELOS: 2-3 weeks   Today's Date: 03/20/2023 OT Individual Time: 8952-8840 OT Individual Time Calculation (min): 72 min     Hospital Problem: Principal Problem:   Right middle cerebral artery stroke [facility])   Past Medical History:  Past Medical History:  Diagnosis Date   Arthritis    Carotid arterial disease (HCC)    Chronic kidney disease (CKD), stage IV (severe) (HCC)    Diverticulosis    colonic diverticulosis   Gallstones    GERD (gastroesophageal reflux disease)    Hypertension    Myocardial infarction (HCC)    Prostate cancer (HCC)    Status post coronary artery bypass grafting     intraoperative cholangiogram   Stroke [facility])    patient reports no deficits    Urinary incontinence    bladder leakage -  patient wears a pad   Past Surgical History:  Past Surgical History:  Procedure Laterality Date   BACK SURGERY     x 4, has titanium in back per patient    CHOLECYSTECTOMY, LAPAROSCOPIC     CORONARY ARTERY BYPASS GRAFT  2001   CRYOTHERAPY     Prostate Cancer   LEFT HEART CATH AND CORS/GRAFTS ANGIOGRAPHY N/A 10/27/2017   Procedure: LEFT HEART CATH AND CORS/GRAFTS ANGIOGRAPHY;  Surgeon: Claudene Victory ORN, MD;  Location: [facility] INVASIVE CV LAB;  Service: Cardiovascular;  Laterality: N/A;   prostate cancer     ROTATOR CUFF REPAIR     LEFT   TOTAL KNEE ARTHROPLASTY Right 12/18/2014   Procedure: TOTAL RIGHT KNEE ARTHROPLASTY;  Surgeon: Donnice Car, MD;  Location: [facility] ORS;  Service: Orthopedics;  Laterality: Right;    Assessment & Plan Clinical Impression: Brandon Harris is a 89 year old right-handed male with history significant for CAD/CABG 2001 maintained on low-dose aspirin , CKD stage IV,  history of prior CVA, hypertension, mesenteric artery stenosis followed by [facility], history of prostate cancer, right total knee arthroplasty 2016, back surgery x 4. Per chart review patient lives with his daughter. 1 level home with a ramped entrance. Ambulatory with a rollator. He still drives to church. Patient recently came to the ER 4 days ago because of abdominal pain at that time CT chest abdomen pelvis showed circumferential thickening at the anorectal junction distention versus proctitis, diverticulosis, right upper quadrant. He was discharged to home on Augmentin  per GI services.. Presented 03/13/2023 with persistent left upper and lower extremity numbness and weakness. Admission chemistries unremarkable except sodium 133, glucose 117 creatinine 2.26, hemoglobin 9.7, lactic acid unremarkable, urinalysis negative nitrite. CT/MRI showed small acute right frontal parietal infarcts. Moderate to severe chronic small vessel ischemic disease with numerous chronic infarcts. MRA of the head unremarkable. MRA of the neck showed severely technically degraded MRA essentially nondiagnostic. Patient did not receive tPA. He was found to be in A-fib with RVR and cardiology services consulted. Placed on IV heparin  transition to Eliquis . Echocardiogram ejection fraction of 65 to 70%. No wall motion abnormalities. Plans to follow-up outpatient considering DCCV if needed. Tolerating a regular consistency diet.   Patient transferred to CIR on 03/19/2023 .    Patient currently requires Nat with basic self-care skills secondary to muscle weakness, decreased cardiorespiratoy endurance, ataxia, decreased coordination, and decreased motor planning, decreased visual perceptual skills, decreased attention  to left and decreased motor planning, decreased attention, decreased awareness, decreased problem solving, decreased safety awareness, and decreased memory, central origin, and decreased sitting balance, decreased standing balance,  decreased postural control, hemiplegia, and decreased balance strategies.  Prior to hospitalization, patient could complete BADLs, drive short distances, and complete yard work with modified independent .  Patient will benefit from skilled intervention to decrease level of assist with basic self-care skills and increase independence with basic self-care skills prior to discharge home with care partner.  Anticipate patient will require 24 hour supervision and follow up home health.  OT - End of Session Activity Tolerance: Decreased this session Endurance Deficit: Yes Endurance Deficit Description: significant endurance deficit with increased rest breaks required during session OT Assessment Rehab Potential (ACUTE ONLY): Good OT Patient demonstrates impairments in the following area(s): Balance;Perception;Safety;Cognition;Edema;Skin Integrity;Endurance;Vision;Motor;Pain OT Basic ADL's Functional Problem(s): Toileting;Dressing;Bathing;Grooming OT Transfers Functional Problem(s): Tub/Shower;Toilet OT Plan OT Intensity: Minimum of 1-2 x/day, 45 to 90 minutes OT Frequency: 5 out of 7 days OT Duration/Estimated Length of Stay: 2-3 weeks OT Treatment/Interventions: Balance/vestibular training;Discharge planning;Pain management;Self Care/advanced ADL retraining;Therapeutic Activities;UE/LE Coordination activities;Cognitive remediation/compensation;Disease mangement/prevention;Functional mobility training;Patient/family education;Skin care/wound managment;Therapeutic Exercise;Visual/perceptual remediation/compensation;Community reintegration;DME/adaptive equipment instruction;Neuromuscular re-education;Psychosocial support;Splinting/orthotics;UE/LE Strength taining/ROM;Wheelchair propulsion/positioning OT Self Feeding Anticipated Outcome(s): Independent OT Basic Self-Care Anticipated Outcome(s): CGA-min A OT Toileting Anticipated Outcome(s): CGA- min A OT Bathroom Transfers Anticipated Outcome(s): CGA OT  Recommendation Patient destination: Home Follow Up Recommendations: Home health OT Equipment Recommended: To be determined   OT Evaluation Precautions/Restrictions  Precautions Precautions: Fall Restrictions Weight Bearing Restrictions Per Provider Order: No General Chart Reviewed: Yes Additional Pertinent History: CAD/CABG 2001, CKD Stage IV, Prior CVA, Hx prostate cancer, R TKA, back surgery, A-fib Family/Caregiver Present: Yes (DTR, Romero) Vital Signs   Pain Pain Assessment Pain Scale: 0-10 Pain Score: 3  Pain Type: Chronic pain Pain Descriptors / Indicators: Aching Home Living/Prior Functioning Home Living Family/patient expects to be discharged to:: Private residence Living Arrangements: Children Available Help at Discharge: Family, Available 24 hours/day Type of Home: House Home Access: Stairs to enter, Ramped entrance [employer] of Steps: 3 in garage Entrance Stairs-Rails: Right Home Layout: One level Bathroom Shower/Tub: Tub/shower unit, Health Visitor: Standard Bathroom Accessibility: Yes Additional Comments: Patient reports he prefers to take tub baths and has grab bars he uses to get in and out of the tub.  Lives With: Daughter IADL History Homemaking Responsibilities: Yes Meal Prep Responsibility: Secondary Laundry Responsibility: Secondary Cleaning Responsibility: No Bill Paying/Finance Responsibility: No Shopping Responsibility: No Child Care Responsibility: No Homemaking Comments: I enjoys assiting with lawn care and taking care of their family farm Current License: Yes (drives short distances to church) Mode of Transportation: Set Designer, Family Occupation: Retired Prior Function Level of Independence: Needs assistance with homemaking, Requires assistive device for independence, Independent with basic ADLs, Independent with homemaking with ambulation (used rollator in the home) Driving: Yes Vocation: Retired Administrator, Sports Baseline  Vision/History: 1 Wears glasses Ability to See in Adequate Light: 0 Adequate Patient Visual Report: No change from baseline Vision Assessment?: Yes Eye Alignment: Within Functional Limits Ocular Range of Motion: Impaired-to be further tested in functional context Alignment/Gaze Preference: Within Defined Limits Tracking/Visual Pursuits: Decreased smoothness of horizontal tracking;Decreased smoothness of vertical tracking;Impaired - to be further tested in functional context (very slowed visual tracking) Saccades: Additional eye shifts occurred during testing Convergence: Impaired (comment) (both eyes slow to converge, no double vision reported) Visual Fields: No apparent deficits Perception  Perception: Impaired Perception-Other Comments: Mild L inattention Praxis Praxis:  Not tested Cognition Cognition Overall Cognitive Status: Impaired/Different from baseline Arousal/Alertness: Awake/alert Orientation Level: Person;Place;Situation Person: Oriented Place: Oriented Situation: Oriented Memory: Impaired Memory Impairment: Decreased recall of new information Attention: Selective;Alternating Selective Attention: Appears intact Alternating Attention Impairment: Verbal basic;Functional basic Awareness: Appears intact Problem Solving: Impaired Problem Solving Impairment: Verbal basic;Functional basic Executive Function: Self Correcting Self Correcting: Impaired Safety/Judgment: Impaired Brief Interview for Mental Status (BIMS) Repetition of Three Words (First Attempt): 3 Temporal Orientation: Year: Correct Temporal Orientation: Month: Accurate within 5 days Temporal Orientation: Day: Correct Recall: Sock: No, could not recall Recall: Blue: Yes, no cue required Recall: Bed: Yes, no cue required BIMS Summary Score: 13 Sensation Sensation Light Touch: Appears Intact (will continue to assess) Coordination Gross Motor Movements are Fluid and Coordinated: No Coordination and  Movement Description: decreased 2/2 B LE weakeness and fatigue Motor  Motor Motor: Hemiplegia;Ataxia Motor - Skilled Clinical Observations: Mild ataxia and L hemiplegia, laterpolsion with Pt pushing to the L in standing  Trunk/Postural Assessment  Cervical Assessment Cervical Assessment: Exceptions to [facility] (forward head) Thoracic Assessment Thoracic Assessment: Exceptions to [facility] (jyphotic posture and rounded shoudlers) Lumbar Assessment Lumbar Assessment: Exceptions to [facility] (posterior pelvic tilt in sitting and standing) Postural Control Postural Control: Deficits on evaluation Trunk Control: decreased Protective Responses: delayed  Balance Balance Balance Assessed: Yes Static Sitting Balance Static Sitting - Balance Support: Feet unsupported Static Sitting - Level of Assistance: 5: Stand by assistance Dynamic Sitting Balance Dynamic Sitting - Balance Support: Feet unsupported Dynamic Sitting - Level of Assistance: 4: Min assist Static Standing Balance Static Standing - Balance Support: During functional activity Static Standing - Level of Assistance: 3: Mod assist Dynamic Standing Balance Dynamic Standing - Balance Support: During functional activity Dynamic Standing - Level of Assistance: 2: Sven assist Extremity/Trunk Assessment RUE Assessment RUE Assessment: Exceptions to [facility] General Strength Comments: Limited shoulder flexion d/t previous rotator cuff injury, 4/5 grossly for grip and elbow flexion LUE Assessment LUE Assessment: Exceptions to [facility] General Strength Comments: Limited shoulder ROM and strength d/t previous rotator cuff injury, strength 3-/5 grossly  Care Tool Care Tool Self Care Eating   Eating Assist Level: Set up assist    Oral Care    Oral Care Assist Level: Minimal Assistance - Patient > 75%    Bathing   Body parts bathed by patient: Right arm;Left arm;Face;Front perineal area;Chest;Abdomen Body parts bathed by helper: Right upper leg;Left upper  leg;Buttocks;Left lower leg;Right lower leg   Assist Level: Maximal Assistance - Patient 24 - 49%    Upper Body Dressing(including orthotics)   What is the patient wearing?: Pull over shirt   Assist Level: Maximal Assistance - Patient 25 - 49%    Lower Body Dressing (excluding footwear)   What is the patient wearing?: Pants Assist for lower body dressing: Maximal Assistance - Patient 25 - 49%    Putting on/Taking off footwear   What is the patient wearing?: Socks Assist for footwear: Maximal Assistance - Patient 25 - 49%       Care Tool Toileting Toileting activity   Assist for toileting: Maximal Assistance - Patient 25 - 49% (simulated)     Care Tool Bed Mobility Roll left and right activity   Roll left and right assist level: Moderate Assistance - Patient 50 - 74%    Sit to lying activity   Sit to lying assist level: Moderate Assistance - Patient 50 - 74%    Lying to sitting on side of bed activity   Lying to  sitting on side of bed assist level: the ability to move from lying on the back to sitting on the side of the bed with no back support.: Maximal Assistance - Patient 25 - 49%     Care Tool Transfers Sit to stand transfer   Sit to stand assist level: Maximal Assistance - Patient 25 - 49%    Chair/bed transfer   Chair/bed transfer assist level: Maximal Assistance - Patient 25 - 49%     Toilet transfer   Assist Level: Maximal Assistance - Patient 24 - 49%     Care Tool Cognition  Expression of Ideas and Wants Expression of Ideas and Wants: 3. Some difficulty - exhibits some difficulty with expressing needs and ideas (e.g, some words or finishing thoughts) or speech is not clear  Understanding Verbal and Non-Verbal Content Understanding Verbal and Non-Verbal Content: 3. Usually understands - understands most conversations, but misses some part/intent of message. Requires cues at times to understand   Memory/Recall Ability Memory/Recall Ability : Current  season;That he or she is in a hospital/hospital unit   Refer to Care Plan for Long Term Goals  SHORT TERM GOAL WEEK 1 OT Short Term Goal 1 (Week 1): Pt will complete sit<>stands during BADLs with mod A x1 consistly using LRAD OT Short Term Goal 2 (Week 1): Pt will complete toilet transfers mod A using LRAD OT Short Term Goal 3 (Week 1): Pt will tolerate standing >1 minute during functional tasks with min A using LRAD OT Short Term Goal 4 (Week 1): Pt will complete grooming/hygiene tasks with min A  Recommendations for other services: Therapeutic Recreation  Pet therapy   Skilled Therapeutic Intervention Skilled Therapeutic Interventions/Progress Updates:  1:1 OT evaluation and intervention initiated with skilled education provided on OT role, goals, and POC. Pt received reclined in bed presenting to be in good spirits receptive to skilled OT session reporting chronic pain in back and knees- OT offering intermittent rest breaks, repositioning, and therapeutic support to optimize participation in therapy session. Pt completed BADLs at levels listed below. Pt presenting with forward flexed posture in sitting/standing, L lateropulsion, and decreased step length with shuffled gait. Pt responding well to verbal, visual, and tactile cues throughout session. Pt was left resting in recliner with call bell in reach, seatbelt alarm on, and all needs met.   ADL ADL Eating: Set up Where Assessed-Eating: Chair Grooming: Moderate assistance Where Assessed-Grooming: Chair Upper Body Bathing: Moderate assistance Where Assessed-Upper Body Bathing: Edge of bed Lower Body Bathing: Maximal assistance Where Assessed-Lower Body Bathing: Edge of bed Upper Body Dressing: Maximal assistance Where Assessed-Upper Body Dressing: Edge of bed Lower Body Dressing: Maximal assistance Where Assessed-Lower Body Dressing: Edge of bed Toileting: Maximal assistance Where Assessed-Toileting: Teacher, Adult Education: Maximal  Dentist Method: Stand pivot;Ambulating Toilet Transfer Equipment: Engineer, Technical Sales: Not assessed Film/video Editor: Not assessed Mobility  Bed Mobility Bed Mobility: Rolling Right;Rolling Left;Supine to Sit;Sit to Supine;Sitting - Scoot to Edge of Bed Rolling Right: Moderate Assistance - Patient 50-74% Rolling Left: Moderate Assistance - Patient 50-74% Supine to Sit: Moderate Assistance - Patient 50-74% Sitting - Scoot to Edge of Bed: Moderate Assistance - Patient 50-74% Sit to Supine: Moderate Assistance - Patient 50-74% Transfers Sit to Stand: Maximal Assistance - Patient 25-49% Stand to Sit: Maximal Assistance - Patient 25-49%   Discharge Criteria: Patient will be discharged from OT if patient refuses treatment 3 consecutive times without medical reason, if treatment goals not met, if there is a  change in medical status, if patient makes no progress towards goals or if patient is discharged from hospital.  The above assessment, treatment plan, treatment alternatives and goals were discussed and mutually agreed upon: by patient and by family  Katheryn SHAUNNA [place]ines 03/20/2023, 12:14 PM

## 2023-03-21 DIAGNOSIS — M19072 Primary osteoarthritis, left ankle and foot: Secondary | ICD-10-CM | POA: Diagnosis not present

## 2023-03-21 DIAGNOSIS — I4891 Unspecified atrial fibrillation: Secondary | ICD-10-CM | POA: Diagnosis not present

## 2023-03-21 DIAGNOSIS — I63511 Cerebral infarction due to unspecified occlusion or stenosis of right middle cerebral artery: Secondary | ICD-10-CM | POA: Diagnosis not present

## 2023-03-21 DIAGNOSIS — I10 Essential (primary) hypertension: Secondary | ICD-10-CM | POA: Diagnosis not present

## 2023-03-21 LAB — BASIC METABOLIC PANEL
Anion gap: 10 (ref 5–15)
BUN: 31 mg/dL — ABNORMAL HIGH (ref 8–23)
CO2: 19 mmol/L — ABNORMAL LOW (ref 22–32)
Calcium: 8.5 mg/dL — ABNORMAL LOW (ref 8.9–10.3)
Chloride: 103 mmol/L (ref 98–111)
Creatinine, Ser: 1.93 mg/dL — ABNORMAL HIGH (ref 0.61–1.24)
GFR, Estimated: 32 mL/min — ABNORMAL LOW (ref >60)
Glucose, Bld: 102 mg/dL — ABNORMAL HIGH (ref 70–99)
Potassium: 3.9 mmol/L (ref 3.5–5.1)
Sodium: 132 mmol/L — ABNORMAL LOW (ref 135–145)

## 2023-03-21 LAB — CBC
HCT: 26.1 % — ABNORMAL LOW (ref 39.0–52.0)
Hemoglobin: 8.7 g/dL — ABNORMAL LOW (ref 13.0–17.0)
MCH: 30.5 pg (ref 26.0–34.0)
MCHC: 33.3 g/dL (ref 30.0–36.0)
MCV: 91.6 fL (ref 80.0–100.0)
Platelets: 304 10*3/uL (ref 150–400)
RBC: 2.85 MIL/uL — ABNORMAL LOW (ref 4.22–5.81)
RDW: 14.1 % (ref 11.5–15.5)
WBC: 9.3 10*3/uL (ref 4.0–10.5)
nRBC: 0 % (ref 0.0–0.2)

## 2023-03-21 MED ORDER — SODIUM CHLORIDE 0.9 % IV SOLN: INTRAVENOUS | Status: AC

## 2023-03-21 MED ORDER — SODIUM CHLORIDE 0.9 % NICU IV INFUSION SIMPLE
INJECTION | INTRAVENOUS | Status: DC
  Filled 2023-03-21 (×3): qty 500

## 2023-03-21 NOTE — Discharge Instructions (Addendum)
 Inpatient Rehab Discharge Instructions  Brandon Harris Discharge date and time: No discharge date for patient encounter.   Activities/Precautions/ Functional Status: Activity: As tolerated Diet: Regular Wound Care: Routine skin checks Functional status:  ___ No restrictions     ___ Walk up steps independently ___ 24/7 supervision/assistance   ___ Walk up steps with assistance ___ Intermittent supervision/assistance  ___ Bathe/dress independently ___ Walk with walker     _x__ Bathe/dress with assistance ___ Walk Independently    ___ Shower independently ___ Walk with assistance    ___ Shower with assistance ___ No alcohol     ___ Return to work/school ________  Special Instructions: No driving smoking or alcohol   COMMUNITY REFERRALS UPON DISCHARGE:    Home Health:   PT   &    OT                Agency:[facility] Phone: [phone]   Medical Equipment/Items Ordered:HAS ALL NEEDED EQUIPMENT FROM PAST ADMISSIONS                                                 Agency/Supplier:NA    My questions have been answered and I understand these instructions. I will adhere to these goals and the provided educational materials after my discharge from the hospital.  Patient/Caregiver Signature _______________________________ Date __________  Clinician Signature _______________________________________ Date __________  Please bring this form and your medication list with you to all your follow-up doctor's appointments. STROKE/TIA DISCHARGE INSTRUCTIONS SMOKING Cigarette smoking nearly doubles your risk of having a stroke & is the single most alterable risk factor  If you smoke or have smoked in the last 12 months, you are advised to quit smoking for your health. Most of the excess cardiovascular risk related to smoking disappears within a year of stopping. Ask you doctor about anti-smoking medications [place] Quit Line: 1-800-QUIT NOW Free Smoking Cessation Classes [phone]   CHOLESTEROL Know your levels; limit fat & cholesterol in your diet  Lipid Panel     Component Value Date/Time   CHOL 98 03/14/2023 0323   TRIG 161 (H) 03/14/2023 0323   HDL 31 (L) 03/14/2023 0323   CHOLHDL 3.2 03/14/2023 0323   VLDL 32 03/14/2023 0323   LDLCALC 35 03/14/2023 0323     Many patients benefit from treatment even if their cholesterol is at goal. Goal: Total Cholesterol (CHOL) less than 160 Goal:  Triglycerides (TRIG) less than 150 Goal:  HDL greater than 40 Goal:  LDL (LDLCALC) less than 100   BLOOD PRESSURE American Stroke Association blood pressure target is less that 120/80 mm/Hg  Your discharge blood pressure is:  BP: (!) 147/89 Monitor your blood pressure Limit your salt and alcohol intake Many individuals will require more than one medication for high blood pressure  DIABETES (A1c is a blood sugar average for last 3 months) Goal HGBA1c is under 7% (HBGA1c is blood sugar average for last 3 months)  Diabetes: No known diagnosis of diabetes    Lab Results  Component Value Date   HGBA1C 5.5 03/14/2023    Your HGBA1c can be lowered with medications, healthy diet, and exercise. Check your blood sugar as directed by your physician Call your physician if you experience unexplained or low blood sugars.  PHYSICAL ACTIVITY/REHABILITATION Goal is 30 minutes at least 4 days per week  Activity:  Increase activity slowly, Therapies: Physical Therapy: Home Health Return to work:  Activity decreases your risk of heart attack and stroke and makes your heart stronger.  It helps control your weight and blood pressure; helps you relax and can improve your mood. Participate in a regular exercise program. Talk with your doctor about the best form of exercise for you (dancing, walking, swimming, cycling).  DIET/WEIGHT Goal is to maintain a healthy weight  Your discharge diet is:  Diet Order             Diet Heart Fluid consistency: Thin  Diet effective now                    liquids Your height is:  Height: 5' 11 (180.3 cm) Your current weight is: Weight: 80.7 kg Your Body Mass Index (BMI) is:  BMI (Calculated): 24.82 Following the type of diet specifically designed for you will help prevent another stroke. Your goal weight range is:   Your goal Body Mass Index (BMI) is 19-24. Healthy food habits can help reduce 3 risk factors for stroke:  High cholesterol, hypertension, and excess weight.  RESOURCES Stroke/Support Group:  Call [phone]   STROKE EDUCATION PROVIDED/REVIEWED AND GIVEN TO PATIENT Stroke warning signs and symptoms How to activate emergency medical system (call 911). Medications prescribed at discharge. Need for follow-up after discharge. Personal risk factors for stroke. Pneumonia vaccine given: No Flu vaccine given: No My questions have been answered, the writing is legible, and I understand these instructions.  I will adhere to these goals & educational materials that have been provided to me after my discharge from the hospital.    Information on my medicine - ELIQUIS  (apixaban )  This medication education was reviewed with me or my healthcare representative as part of my discharge preparation.   Why was Eliquis  prescribed for you? Eliquis  was prescribed for you to reduce the risk of a blood clot forming that can cause a stroke if you have a medical condition called atrial fibrillation (a type of irregular heartbeat).  What do You need to know about Eliquis  ? Take your Eliquis  TWICE DAILY - one tablet in the morning and one tablet in the evening with or without food. If you have difficulty swallowing the tablet whole please discuss with your pharmacist how to take the medication safely.  Take Eliquis  exactly as prescribed by your doctor and DO NOT stop taking Eliquis  without talking to the doctor who prescribed the medication.  Stopping may increase your risk of developing a stroke.  Refill your prescription before you run  out.  After discharge, you should have regular check-up appointments with your healthcare provider that is prescribing your Eliquis .  In the future your dose may need to be changed if your kidney function or weight changes by a significant amount or as you get older.  What do you do if you miss a dose? If you miss a dose, take it as soon as you remember on the same day and resume taking twice daily.  Do not take more than one dose of ELIQUIS  at the same time to make up a missed dose.  Important Safety Information A possible side effect of Eliquis  is bleeding. You should call your healthcare provider right away if you experience any of the following: Bleeding from an injury or your nose that does not stop. Unusual colored urine (red or dark brown) or unusual colored stools (red or black). Unusual bruising for unknown reasons. A serious  fall or if you hit your head (even if there is no bleeding).  Some medicines may interact with Eliquis  and might increase your risk of bleeding or clotting while on Eliquis . To help avoid this, consult your healthcare provider or pharmacist prior to using any new prescription or non-prescription medications, including herbals, vitamins, non-steroidal anti-inflammatory drugs (NSAIDs) and supplements.  This website has more information on Eliquis  (apixaban ): http://[email or web address] .com/eliquis dena

## 2023-03-21 NOTE — Discharge Summary (Signed)
 Physician Discharge Summary  Patient ID: NICHOLS CORTER MRN: [id number] DOB/AGE: November 18, 1928 89 y.o.  Admit date: 03/19/2023 Discharge date: 03/31/2023  Discharge Diagnoses:  Principal Problem:   Right middle cerebral artery stroke [facility]) DVT prophylaxis Newly diagnosed atrial fibrillation with RVR CAD/CABG Hypertension History of prostate cancer with recent E. coli UTI/prostatitis CKD stage V GERD Hyperlipidemia History of mesenteric artery stenosis Anemia of chronic disease Mood stabilization   Discharged Condition: Stable  Significant Diagnostic Studies: DG Ankle 2 Views Left Result Date: 03/19/2023 CLINICAL DATA:  Left ankle pain and swelling. EXAM: LEFT ANKLE - 2 VIEW COMPARISON:  None Available. FINDINGS: There is no evidence of an acute fracture, dislocation, or joint effusion. A chronic appearing deformity is seen involving the base of the fifth left metatarsal. Mild to moderate severity degenerative changes are seen involving the tibiotalar joint. There is marked severity vascular calcification. Moderate severity soft tissue swelling is seen along the left heel and plantar aspect of the proximal to mid left foot. A 4 mm soft tissue ulceration is also suspected within the region posterior to the left calcaneus. IMPRESSION: 1. Moderate severity soft tissue swelling along the left heel and plantar aspect of the proximal to mid left foot. 2. Mild to moderate severity degenerative changes involving the tibiotalar joint. Electronically Signed   By: Aram Candela M.D.   On: 03/19/2023 19:58   VAS US CAROTID Result Date: 03/15/2023 Carotid Arterial Duplex Study Patient Name:  BENI TURRELL  Date of Exam:   03/15/2023 Medical Rec #: [id number]      Accession #:    [id number] Date of Birth: Nov 02, 1928     Patient Gender: M Patient Age:   14 years Exam Location:  [facility] Procedure:      VAS US CAROTID Referring Phys: Terrilee Files [facility]  --------------------------------------------------------------------------------  Indications:       CVA, Carotid artery disease and Numbness. Risk Factors:      Hypertension, hyperlipidemia, no history of smoking, prior                    MI, coronary artery disease, prior CVA. Other Factors:     CABG X 5, new atrial fibrillation. Limitations        Today's exam was limited due to vessel tortuosity, heavy                    calcification resulting in significant shadowing, and                    respiratory variation. Comparison Study:  Prior carotid duplex done 03/19/22 at [facility] indicating                    1-39% ICA stenosis, bilaterally. Patient has a long history                    of technically difficult carotid studies limited by calcific                    plaque, acoustic shadowing, and vessel tortuoisity with                    varying results from study to study. Performing Technologist: Sherren Kerns RVS  Examination Guidelines: A complete evaluation includes B-mode imaging, spectral Doppler, color Doppler, and power Doppler as needed of all accessible portions of each vessel. Bilateral testing is considered an integral part of a complete examination. Limited  examinations for reoccurring indications may be performed as noted.  Right Carotid Findings: +----------+--------+--------+--------+----------------------+---------+           PSV cm/sEDV cm/sStenosisPlaque Description    Comments  +----------+--------+--------+--------+----------------------+---------+ CCA Prox  40      13              irregular and calcific          +----------+--------+--------+--------+----------------------+---------+ CCA Distal66      22              irregular and calcific          +----------+--------+--------+--------+----------------------+---------+ ICA Prox  182     37      40-59%  calcific              Shadowing +----------+--------+--------+--------+----------------------+---------+ ICA  Mid   54      15                                    tortuous  +----------+--------+--------+--------+----------------------+---------+ ICA Distal47      23                                    tortuous  +----------+--------+--------+--------+----------------------+---------+ ECA       66      9                                               +----------+--------+--------+--------+----------------------+---------+ +----------+--------+-------+--------+-------------------+           PSV cm/sEDV cmsDescribeArm Pressure (mmHG) +----------+--------+-------+--------+-------------------+ [id number]                                         +----------+--------+-------+--------+-------------------+ +---------+--------+--+--------+--+---------+ VertebralPSV cm/s55EDV cm/s15Antegrade +---------+--------+--+--------+--+---------+  Left Carotid Findings: +----------+--------+--------+--------+------------------+--------+           PSV cm/sEDV cm/sStenosisPlaque DescriptionComments +----------+--------+--------+--------+------------------+--------+ CCA Prox  60      9                                          +----------+--------+--------+--------+------------------+--------+ CCA Distal61      3                                          +----------+--------+--------+--------+------------------+--------+ ICA Prox  163     40      40-59%                    tortuous +----------+--------+--------+--------+------------------+--------+ ICA Mid   105     23                                tortuous +----------+--------+--------+--------+------------------+--------+ ICA Distal  tortuous +----------+--------+--------+--------+------------------+--------+ ECA       130     13                                         +----------+--------+--------+--------+------------------+--------+  +----------+--------+--------+--------+-------------------+           PSV cm/sEDV cm/sDescribeArm Pressure (mmHG) +----------+--------+--------+--------+-------------------+ [id number]                                          +----------+--------+--------+--------+-------------------+ +---------+--------+--------+--------+ VertebralPSV cm/sEDV cm/soccluded +---------+--------+--------+--------+   Summary: Right Carotid: Velocities in the right ICA are consistent with a 40-59%                stenosis. Stenosis may be underestimated secondary to bulky                plaque and vessel tortuosity. Left Carotid: Velocities in the left ICA are consistent with a 40-59% stenosis.               Stenosis may be underestimated secondary to bulky plaque and               vessel tortuosity. Vertebrals:  Right vertebral artery demonstrates antegrade flow. Left vertebral              artery demonstrates an occlusion. Subclavians: Normal flow hemodynamics were seen in bilateral subclavian              arteries. *See table(s) above for measurements and observations.  Electronically signed by Sherald Hess MD on 03/15/2023 at 3:19:04 PM.    Final    MR ANGIO NECK WO CONTRAST Result Date: 03/14/2023 CLINICAL DATA:  Acute neurologic deficit EXAM: MRA NECK WITHOUT CONTRAST TECHNIQUE: Angiographic images of the neck were acquired using MRA technique without intravenous contrast. Carotid stenosis measurements (when applicable) are obtained utilizing NASCET criteria, using the distal internal carotid diameter as the denominator. COMPARISON:  None Available. FINDINGS: Severely technically degraded MRA. There is no flow related enhancement in any vessel, which is likely artifactual. There is a 3 vessel aortic arch branching pattern. The study is essentially nondiagnostic. IMPRESSION: Severely technically degraded MRA, essentially nondiagnostic. Electronically Signed   By: Deatra Robinson M.D.   On: 03/14/2023 21:41    MR ANGIO HEAD WO CONTRAST Result Date: 03/14/2023 CLINICAL DATA:  Acute neurologic deficit EXAM: MRA HEAD WITHOUT CONTRAST TECHNIQUE: Angiographic images of the Circle of Willis were acquired using MRA technique without intravenous contrast. COMPARISON:  None Available. FINDINGS: POSTERIOR CIRCULATION: Vertebral arteries are normal. No proximal occlusion of the anterior or inferior cerebellar arteries. Basilar artery is diminutive distally. Superior cerebellar arteries are normal. Posterior cerebral arteries are normal. ANTERIOR CIRCULATION: Intracranial internal carotid arteries are normal. Anterior cerebral arteries are normal. Middle cerebral arteries are normal. Anatomic Variants: Fetal predominant origins of the posterior cerebral arteries. IMPRESSION: Normal intracranial MRA. Electronically Signed   By: Deatra Robinson M.D.   On: 03/14/2023 21:23   ECHOCARDIOGRAM COMPLETE Result Date: 03/14/2023    ECHOCARDIOGRAM REPORT   Patient Name:   MASSIE MEES Date of Exam: 03/14/2023 Medical Rec #:  [id number]     Height:       71.0 in Accession #:    [id number]    Weight:       203.0 lb Date of Birth:  06/25/1928    BSA:          2.122 m Patient Age:    89 years      BP:           130/99 mmHg Patient Gender: M             HR:           75 bpm. Exam Location:  Inpatient Procedure: 2D Echo, Color Doppler, Cardiac Doppler and Intracardiac            Opacification Agent Indications:    Stroke I63.9  History:        Patient has prior history of Echocardiogram examinations, most                 recent 07/26/2019.  Sonographer:    Harriette Bouillon RDCS Referring Phys: [id number] ARSHAD N KAKRAKANDY IMPRESSIONS  1. Left ventricular ejection fraction, by estimation, is 65 to 70%. Left ventricular ejection fraction by PLAX is 66 %. The left ventricle has normal function. The left ventricle has no regional wall motion abnormalities. There is mild left ventricular hypertrophy. Left ventricular diastolic function could not be evaluated.   2. Right ventricular systolic function is mildly reduced. The right ventricular size is normal.  3. Left atrial size was mildly dilated.  4. The mitral valve is abnormal. Trivial mitral valve regurgitation.  5. The aortic valve is tricuspid. There is mild calcification of the aortic valve. Aortic valve regurgitation is trivial. Aortic valve sclerosis/calcification is present, without any evidence of aortic stenosis.  6. Aortic dilatation noted. There is borderline dilatation of the ascending aorta, measuring 38 mm.  7. Rhythm strip during this exam demonstrates atrial fibrillation. Comparison(s): Prior images unable to be directly viewed, comparison made by report only. Changes from prior study are noted. 07/26/2019: LVEF 55-60%, normal RV systolic function. FINDINGS  Left Ventricle: Left ventricular ejection fraction, by estimation, is 65 to 70%. Left ventricular ejection fraction by PLAX is 66 %. The left ventricle has normal function. The left ventricle has no regional wall motion abnormalities. The left ventricular internal cavity size was normal in size. There is mild left ventricular hypertrophy. Left ventricular diastolic function could not be evaluated due to atrial fibrillation. Left ventricular diastolic function could not be evaluated. Right Ventricle: The right ventricular size is normal. No increase in right ventricular wall thickness. Right ventricular systolic function is mildly reduced. Left Atrium: Left atrial size was mildly dilated. Right Atrium: Right atrial size was normal in size. Pericardium: There is no evidence of pericardial effusion. Mitral Valve: The mitral valve is abnormal. Mild mitral annular calcification. Trivial mitral valve regurgitation. Tricuspid Valve: The tricuspid valve is grossly normal. Tricuspid valve regurgitation is mild. Aortic Valve: The aortic valve is tricuspid. There is mild calcification of the aortic valve. Aortic valve regurgitation is trivial. Aortic valve  sclerosis/calcification is present, without any evidence of aortic stenosis. Pulmonic Valve: The pulmonic valve was normal in structure. Pulmonic valve regurgitation is not visualized. Aorta: Aortic dilatation noted. There is borderline dilatation of the ascending aorta, measuring 38 mm. IAS/Shunts: No atrial level shunt detected by color flow Doppler. EKG: Rhythm strip during this exam demonstrates atrial fibrillation.  LEFT VENTRICLE PLAX 2D LV EF:         Left ventricular ejection fraction by PLAX is 66 %. LVIDd:         5.20 cm LVIDs:         3.30 cm LV PW:  1.20 cm LV IVS:        1.10 cm LVOT diam:     2.40 cm LV SV:         81 LV SV Index:   38 LVOT Area:     4.52 cm  LEFT ATRIUM             Index LA diam:        5.40 cm 2.54 cm/m LA Vol (A2C):   74.1 ml 34.92 ml/m LA Vol (A4C):   73.9 ml 34.83 ml/m LA Biplane Vol: 74.6 ml 35.16 ml/m  AORTIC VALVE LVOT Vmax:   83.60 cm/s LVOT Vmean:  54.700 cm/s LVOT VTI:    0.179 m  AORTA Ao Root diam: 3.30 cm Ao Asc diam:  3.80 cm TRICUSPID VALVE TR Peak grad:   23.6 mmHg TR Vmax:        243.00 cm/s  SHUNTS Systemic VTI:  0.18 m Systemic Diam: 2.40 cm Zoila Shutter MD Electronically signed by Zoila Shutter MD Signature Date/Time: 03/14/2023/1:17:21 PM    Final    MR BRAIN WO CONTRAST Result Date: 03/13/2023 CLINICAL DATA:  Neuro deficit, acute, stroke suspected. Left upper extremity numbness. Difficulty walking. EXAM: MRI HEAD WITHOUT CONTRAST TECHNIQUE: Multiplanar, multiecho pulse sequences of the brain and surrounding structures were obtained without intravenous contrast. COMPARISON:  Head CT 03/13/2023 FINDINGS: Brain: Scattered small acute cortical and subcortical infarcts are present in the posterior right frontal and anterior right parietal lobes (MCA territory). Several chronic microhemorrhages are noted in the cerebral and cerebellar hemispheres. Patchy to confluent T2 hyperintensities in the cerebral white matter bilaterally are nonspecific but  compatible with moderate to severe chronic small vessel ischemic disease. Small chronic infarcts are present in the right occipital lobe, both cerebellar hemispheres, right pons, thalami, and bilateral basal ganglia. There is moderate cerebral and cerebellar atrophy. No mass, midline shift, or extra-axial fluid collection is identified. Vascular: Absent flow void in the distal left vertebral artery. Skull and upper cervical spine: Unremarkable bone marrow signal. Sinuses/Orbits: Bilateral cataract extraction. Opacification of a single posterior left ethmoid air cell. No significant mastoid fluid. Other: None. IMPRESSION: 1. Small acute right frontoparietal infarcts. 2. Moderate to severe chronic small vessel ischemic disease with numerous chronic infarcts. 3. 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 Electronically Signed   By: Sebastian Ache M.D.   On: 03/13/2023 17:31   CT Head Wo Contrast Result Date: 03/13/2023 CLINICAL DATA:  Neuro deficit, acute, stroke suspected EXAM: CT HEAD WITHOUT CONTRAST TECHNIQUE: Contiguous axial images were obtained from the base of the skull through the vertex without intravenous contrast. RADIATION DOSE REDUCTION: This exam was performed according to the departmental dose-optimization program which includes automated exposure control, adjustment of the mA and/or kV according to patient size and/or use of iterative reconstruction technique. COMPARISON:  08/05/2022 FINDINGS: Brain: No evidence of acute infarction, hemorrhage, hydrocephalus, extra-axial collection or mass lesion/mass effect. Patchy low-density changes within the periventricular and subcortical white matter most compatible with chronic microvascular ischemic change. Moderate diffuse cerebral volume loss. Vascular: Atherosclerotic calcifications involving the large vessels of the skull base. No unexpected hyperdense vessel. Skull: Normal. Negative for fracture or focal lesion. Sinuses/Orbits: No acute  finding. Other: None. IMPRESSION: 1. No acute intracranial findings. 2. Chronic microvascular ischemic change and cerebral volume loss. Electronically Signed   By: Duanne Guess D.O.   On: 03/13/2023 11:57   CT CHEST ABDOMEN PELVIS WO CONTRAST Result Date: 03/09/2023 CLINICAL DATA:  Two day history of  abdominal pain with emesis associated with dysphagia and questionable chest pain. History of prostate cancer. * Tracking Code: BO * EXAM: CT CHEST, ABDOMEN AND PELVIS WITHOUT CONTRAST TECHNIQUE: Multidetector CT imaging of the chest, abdomen and pelvis was performed following the standard protocol without IV contrast. RADIATION DOSE REDUCTION: This exam was performed according to the departmental dose-optimization program which includes automated exposure control, adjustment of the mA and/or kV according to patient size and/or use of iterative reconstruction technique. COMPARISON:  Same day chest radiograph, CT chest dated 08/05/2022, nuclear medicine PET dated 06/23/2022, CT abdomen and pelvis dated 05/22/2021 FINDINGS: CT CHEST FINDINGS Cardiovascular: Multichamber cardiomegaly. Curvilinear hypoattenuation at the left ventricular apex. No significant pericardial fluid/thickening. Great vessels are normal in course and caliber. Coronary artery calcifications. Mediastinum/Nodes: Imaged thyroid gland without nodules meeting criteria for imaging follow-up by size. Mildly patulous, fluid-filled upper esophagus. No pathologically enlarged axillary, supraclavicular, mediastinal, or hilar lymph nodes. Lungs/Pleura: The central airways are patent. Increased conspicuity of lower lobe predominant subpleural reticulations. No substantial traction bronchiectasis or architectural distortion. No focal consolidation. No pneumothorax. Trace bilateral pleural effusions. Musculoskeletal: No definite focal osseous abnormality associated with the area of radiotracer uptake in the right anterior fourth rib. Multilevel degenerative  changes of the thoracic spine. Median sternotomy wires are nondisplaced. CT ABDOMEN PELVIS FINDINGS Hepatobiliary: No focal hepatic lesions. No intra or extrahepatic biliary ductal dilation. Cholecystectomy. Pancreas: No focal lesions or main ductal dilation. Spleen: Normal in size without focal abnormality. Adrenals/Urinary Tract: No adrenal nodules. Atrophic bilateral kidneys. No suspicious renal mass on this noncontrast enhanced examination , calculi, or hydronephrosis. No focal bladder wall thickening. Stomach/Bowel: Normal appearance of the stomach. Cecum is again located in the right upper quadrant. No abnormal bowel dilation. Circumferential mural thickening of the anorectal junction. Colonic diverticulosis without acute diverticulitis. Appendix is not discretely seen. Vascular/Lymphatic: Aortic atherosclerosis. No enlarged abdominal or pelvic lymph nodes. Reproductive: Normal prostate gland size. Other: No free fluid, fluid collection, or free air. Musculoskeletal: No acute or abnormal lytic or blastic osseous findings. Multilevel degenerative changes of the lumbar spine. Postsurgical changes of L1-L5 spinal fixation with increased L3 superior endplate compression deformity compared to 05/22/2021. Small fat containing midline ventral epigastric hernia. IMPRESSION: 1. Circumferential mural thickening of the anorectal junction, which may be related to underdistention or proctitis. 2. Postsurgical changes of L1-L5 spinal fixation with increased L3 superior endplate compression deformity compared to 05/22/2021. 3. No definite focal osseous abnormality associated with the area of previous radiotracer uptake in the right anterior fourth rib. 4. Curvilinear hypoattenuation at the left ventricular apex, which may represent sequela of prior insult. 5. Trace bilateral pleural effusions. 6.  Aortic Atherosclerosis (ICD10-I70.0). Electronically Signed   By: Agustin Cree M.D.   On: 03/09/2023 10:36   DG Chest 1  View Result Date: 03/09/2023 CLINICAL DATA:  Cough. EXAM: CHEST  1 VIEW COMPARISON:  March 12, 2020. FINDINGS: Stable cardiomediastinal silhouette. Status post coronary bypass graft. Probable some degree of eventration involving the right hemidiaphragm. Mild right basilar atelectasis or infiltrate may be present as well. Bony thorax unremarkable. IMPRESSION: Possible mild right basilar opacity is noted concerning for pneumonia or possibly atelectasis. Followup PA and lateral chest X-ray is recommended in 3-4 weeks following trial of antibiotic therapy to ensure resolution and exclude underlying malignancy. Electronically Signed   By: Lupita Raider M.D.   On: 03/09/2023 08:09    Labs:  Basic Metabolic Panel: Recent Labs  Lab 03/27/23 1010  NA 134*  K 3.6  CL 104  CO2 22  GLUCOSE 116*  BUN 17  CREATININE 1.95*  CALCIUM 8.6*    CBC: Recent Labs  Lab 03/27/23 1010  WBC 7.8  NEUTROABS 4.6  HGB 9.5*  HCT 30.0*  MCV 94.0  PLT 387    CBG: No results for input(s): "GLUCAP" in the last 168 hours.  Family history.  Mother with heart failure  Brief HPI:   Cirilo ORAZIO WELLER is a 89 y.o. right-handed male with history significant for CAD/CABG 2001 maintained on low-dose aspirin, C Kd stage V, history of prior CVA hypertension, mesenteric artery stenosis followed by [facility], history of prostate cancer, right total hip arthroplasty 2016 and back surgery x 4.  Per chart review lives with his daughter.  1 level home with a ramped entrance.  Ambulatory with a rollator.  He still drives and attends church regularly.  Patient recently came to the ER 4 days ago because abdominal pain with CT chest abdomen pelvis showing circumferential thickening of the anorectal junction distention versus proctitis, diverticulosis, right upper quadrant.  He was discharged to home on Augmentin per GI services.  Presented 03/13/2023 with persistent left upper and lower extremity numbness and weakness.  Admission chemistries  unremarkable except sodium 133 glucose 117 creatinine 2.26 hemoglobin 9.7 lactic acid unremarkable urinalysis negative nitrite.  CT/MRI showed small acute right frontal parietal infarct.  Moderate to severe chronic small vessel ischemic disease with numerous chronic infarcts.  MRA of the head unremarkable.  MRA of the neck showed severely technically degraded MRA essentially nondiagnostic.  Patient did not receive tPA.  He was found to be in A-fib with RVR cardiology services consulted.  Placed on intravenous heparin transition to Eliquis.  Echocardiogram ejection fraction of 65 to 70% no wall motion abnormalities.  Plan to follow-up outpatient considering DCCV if needed.  Therapy evaluations completed due to patient's left side weakness decreased functional mobility was admitted for a comprehensive rehab program.   Hospital Course: Bluford CLINT BIELLO was admitted to rehab 03/19/2023 for inpatient therapies to consist of PT, ST and OT at least three hours five days a week. Past admission physiatrist, therapy team and rehab RN have worked together to provide customized collaborative inpatient rehab.  Pertaining to patient's right MCA cortical with several small infarcts likely secondary to newly diagnosed A-fib RVR remained stable maintained on Eliquis therapy.  Patient would follow-up with neurology services.  Pain management with the use of Voltaren gel as well as Tylenol.  He did have some pain and swelling around the left ankle without known injury x-rays left ankle arthritic changes tib-talar joint soft tissue swelling.  Noted newly diagnosed A-fib RVR continue Eliquis as well as Coreg follow-up outpatient cardiology services considering DCCV with cardiac rate controlled.  History of CAD/CABG Imdur as directed.  Blood pressure overall controlled and monitored on low-dose Norvasc/Coreg as well as Imdur.  History of prostate cancer recent E. coli UTI prostatitis completing course of Augmentin.  CKD stage IV baseline  creatinine 2.0-2.2 and monitoring of chemistries and Lasix remained on hold.  GERD abdominal pain nausea vomiting seen by GI recommended empiric PPI daily Flagyl since been discontinued.  No plans for endoscopic intervention at this time.  Mood stabilization with use of Remeron as well as Xanax as needed with emotional support provided.  Lipitor ongoing for hyperlipidemia.  History of mesenteric artery stenosis follow-up outpatient.  Anemia of chronic disease no bleeding episodes.   Blood pressures were monitored on TID basis and soft and monitored  Rehab course: During patient's stay in rehab weekly team conferences were held to monitor patient's progress, set goals and discuss barriers to discharge. At admission, patient required Dave assist sit to stand minimal assist 78 feet rolling walker  Physical exam.  Blood pressure 150/72 pulse 81 temperature 97.6 respirations 18 oxygen saturation is 99% room air Constitutional.  No acute distress HEENT Head.  Normocephalic and atraumatic Eyes.  Pupils round and reactive to light no discharge without nystagmus Neck.  Supple nontender no JVD without thyromegaly Cardiac regular rate and rhythm without any extra sounds or murmur heard Abdomen.  Soft nontender positive bowel sounds without rebound Respiratory effort normal no respiratory distress without wheeze Skin.  Warm and dry Neurologic.  Alert and oriented to person place month and year and reason he is here.  Normal insight and awareness.  Intact memory.  Speech mildly dysarthric.  Cranial nerve exam unremarkable.  Mild left inattention.  Manual muscle testing both shoulders limited D/T pain/chronic RTC injuries.  Right upper extremity 4-4+/5 proximal to distal left upper extremity 3+ to 4 - bicep, tricep wrist and hand.  Left lower extremity 3/5 hip flexors knee extension and 3+ with ankle dorsi plantarflexion.  Right lower extremity 4-4+/5 proximal to distal sensory exam unremarkable.  He/She   has had improvement in activity tolerance, balance, postural control as well as ability to compensate for deficits. He/She has had improvement in functional use RUE/LUE  and RLE/LLE as well as improvement in awareness.  Perform sit to stand from edge of bed bilateral upper extremity grasp the therapist elbows with min mod assist.  He does demonstrate a posterior lean.  Ambulates 43 feet using rolling walker contact-guard/minimal assist demonstrating flexed posture and downward gaze.  Working with energy conservation techniques.  Completes upper and lower body dressing seated edge of bed.  Able to doff his shirt using Hemi technique with supervision.  Able to lean forward to weave bilateral lower extremities into pants with contact-guard.  He did demonstrate some alternating attention 1 follow-up per speech therapy.  He was able to express his basic needs but did have some difficulty with some words or finishing thoughts..  Full family teaching completed plan discharge to home.       Disposition:  There are no questions and answers to display.         Diet: Regular  Special Instructions: No driving smoking or alcohol  Medications at discharge. 1.  Tylenol as needed 2.  Xanax 0.25 mg nightly as needed 3.  Eliquis 2.5 mg p.o. twice daily 4.  Lipitor 80 mg p.o. daily 5.  Coreg 25 mg p.o. twice daily 6.  Voltaren gel 2 g 3 times daily 7.  Ferrous sulfate 325 mg p.o. daily 8.  Imdur 15 mg p.o. daily 9.  Remeron 15 mg p.o. nightly 10.  Protonix 40 mg p.o. daily 11.  Carafate 1 g 3 times daily with meals and bedtime 12.  Voltaren gel 4 g 4 times daily 13.  Nitroglycerin as needed 14.  Norvasc 5 mg daily   30-35 minutes were spent completing discharge summary and discharge planning  Discharge Instructions     Ambulatory referral to Neurology   Complete by: As directed    An appointment is requested in approximately: 4 weeks right MCA infarction   Ambulatory referral to Physical  Medicine Rehab   Complete by: As directed    Moderate complexity follow-up 1 to 2 weeks right MCA infarction  Follow-up Information     Kirsteins, Victorino Sparrow, MD Follow up.   Specialty: Physical Medicine and Rehabilitation Why: Office to call for appointment Contact information: [address] [place] [address] [phone]         Nahser, Deloris Ping, MD Follow up.   Specialty: Cardiology Why: Call for appointment Contact information: [address] [place] [address] [phone]         Liliane Shi H, DO Follow up.   Specialty: Gastroenterology Why: Call for appointment Contact information: [address] [place] [address] [place] [address] [phone]                 Signed: Charlton Amor 03/29/2023, 8:54 AM

## 2023-03-21 NOTE — Progress Notes (Signed)
 PROGRESS NOTE   Subjective/Complaints: Slept well. Had busy day with therapy. A bit tired afterward. No new issues this morning. Still with left ankle/foot discomfort but no problems reported that I can see by therapy.   ROS: Patient denies fever, rash, sore throat, blurred vision, dizziness, nausea, vomiting, diarrhea, cough, shortness of breath or chest pain,  headache, or mood change.     Objective:   DG Ankle 2 Views Left Result Date: 03/19/2023 CLINICAL DATA:  Left ankle pain and swelling. EXAM: LEFT ANKLE - 2 VIEW COMPARISON:  None Available. FINDINGS: There is no evidence of an acute fracture, dislocation, or joint effusion. A chronic appearing deformity is seen involving the base of the fifth left metatarsal. Mild to moderate severity degenerative changes are seen involving the tibiotalar joint. There is marked severity vascular calcification. Moderate severity soft tissue swelling is seen along the left heel and plantar aspect of the proximal to mid left foot. A 4 mm soft tissue ulceration is also suspected within the region posterior to the left calcaneus. IMPRESSION: 1. Moderate severity soft tissue swelling along the left heel and plantar aspect of the proximal to mid left foot. 2. Mild to moderate severity degenerative changes involving the tibiotalar joint. Electronically Signed   By: Suzen Dials M.D.   On: 03/19/2023 19:58   Recent Labs    03/19/23 0631  WBC 11.5*  HGB 9.5*  HCT 28.7*  PLT 293   Recent Labs    03/19/23 0631  NA 135  K 3.8  CL 105  CO2 20*  GLUCOSE 92  BUN 24*  CREATININE 2.05*  CALCIUM  8.4*    Intake/Output Summary (Last 24 hours) at 03/21/2023 0819 Last data filed at 03/21/2023 0519 Gross per 24 hour  Intake 357 ml  Output 825 ml  Net -468 ml        Physical Exam: Vital Signs Blood pressure (!) 140/70, pulse 87, temperature 98.4 F (36.9 C), temperature source Oral, resp. rate  18, height 5' 11 (1.803 m), weight 80.9 kg, SpO2 100%.  Constitutional: No distress . Vital signs reviewed. HEENT: NCAT, EOMI, oral membranes moist Neck: supple Cardiovascular: IRR/IRR w/ murmur. No JVD    Respiratory/Chest: CTA Bilaterally without wheezes or rales. Normal effort    GI/Abdomen: BS +, non-tender, non-distended Ext: no clubbing, cyanosis, or edema Psych: pleasant and cooperative  Skin: Clean and intact without signs of breakdown Neuro:  pt is fairly alert, oriented. Fair insight and awareness. Speech still dysarthric. Normal language. CN notable for left CN VII, left inattetion. Shoulders both limited by ortho. RUE otherwise is 4 to 4+/5 and LUE is 3+ to 4/5. RLE is 4/5. LLE is 3 to 3+/5 prox to distal. Sl decrease in LT left arm and leg. No frank limb ataxia or cerebellar signs. No abnormal tone appreciaed. Exam stable 2/9 Musculoskeletal: both shoulders limited in PROM/AROM.  Left ankle, heel tender with palpation and ROM, perhaps a little better today 2/9. Mild effusion about medial malleolus.    Assessment/Plan: 1. Functional deficits which require 3+ hours per day of interdisciplinary therapy in a comprehensive inpatient rehab setting. Physiatrist is providing close team supervision and 24 hour management of  active medical problems listed below. Physiatrist and rehab team continue to assess barriers to discharge/monitor patient progress toward functional and medical goals  Care Tool:  Bathing    Body parts bathed by patient: Right arm, Left arm, Face, Front perineal area, Chest, Abdomen   Body parts bathed by helper: Right upper leg, Left upper leg, Buttocks, Left lower leg, Right lower leg     Bathing assist Assist Level: Maximal Assistance - Patient 24 - 49%     Upper Body Dressing/Undressing Upper body dressing   What is the patient wearing?: Pull over shirt    Upper body assist Assist Level: Maximal Assistance - Patient 25 - 49%    Lower Body  Dressing/Undressing Lower body dressing      What is the patient wearing?: Pants     Lower body assist Assist for lower body dressing: Maximal Assistance - Patient 25 - 49%     Toileting Toileting    Toileting assist Assist for toileting: Maximal Assistance - Patient 25 - 49% (simulated)     Transfers Chair/bed transfer  Transfers assist     Chair/bed transfer assist level: Maximal Assistance - Patient 25 - 49%     Locomotion Ambulation   Ambulation assist   Ambulation activity did not occur: Safety/medical concerns (limited 2/2 high ankle pain)          Walk 10 feet activity   Assist  Walk 10 feet activity did not occur: Safety/medical concerns (limited 2/2 high ankle pain)        Walk 50 feet activity   Assist Walk 50 feet with 2 turns activity did not occur: Safety/medical concerns (limited 2/2 high ankle pain)         Walk 150 feet activity   Assist Walk 150 feet activity did not occur: Safety/medical concerns (limited 2/2 high ankle pain)         Walk 10 feet on uneven surface  activity   Assist Walk 10 feet on uneven surfaces activity did not occur: Safety/medical concerns (limited 2/2 high ankle pain)         Wheelchair     Assist Is the patient using a wheelchair?: Yes Type of Wheelchair: Manual    Wheelchair assist level: Dependent - Patient 0% [place] wheelchair distance: 347ft    Wheelchair 50 feet with 2 turns activity    Assist        Assist Level: Dependent - Patient 0%   Wheelchair 150 feet activity     Assist      Assist Level: Dependent - Patient 0%   Blood pressure (!) 140/70, pulse 87, temperature 98.4 F (36.9 C), temperature source Oral, resp. rate 18, height 5' 11 (1.803 m), weight 80.9 kg, SpO2 100%.  Medical Problem List and Plan: 1. Functional deficits secondary to right MCA cortical several small infarcts likely secondary to newly diagnosed A-fib RVR             -patient may   shower             -ELOS/Goals: 11-15 days, supervision to mod I goals with PT, OT, SLP  -Continue CIR therapies including PT, OT, and SLP   2.  Antithrombotics: -DVT/anticoagulation:  Pharmaceutical: Eliquis              -antiplatelet therapy: N/A 3. 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 Pt c/o pain in this area             -  now with pain and sl swelling around left ankle without known injury             - xray of left ankle with arthritic changes tib-talar joint and soft tissue swelling of heel and plantar foot             -continue voltaren  gel to right chest wall and ankle TID.   -needs appropriate shoewear when up with therapy             -observe for activity tolerance. Might benefit from air cast  -2/9 no specific problems noted by therapy--monitor for now 4. Mood/Behavior/Sleep: Remeron  15 mg nightly.  Xanax  0.25 mg nightly as needed             -antipsychotic agents: N/A 5. Neuropsych/cognition: This patient is capable of making decisions on his own behalf. 6. Skin/Wound Care: Routine skin checks 7. Fluids/Electrolytes/Nutrition: Routine and analysis follow-up chemistries 8.  Newly diagnosed A-fib with RVR.  Continue Eliquis  as well as Coreg  12.5 mg twice daily.  Plan outpatient follow-up consider DCCV  -remains in a fib, rate controlled 9.  CAD/CABG.  Imdur  15 mg daily. 10.  Hypertension.  Monitor with increased mobility.  Hydralazine  and Norvasc  currently on hold due to soft blood pressure and resume as needed  -2/9 fair control, continue with current regimen 11.  History of prostate cancer with recent E. coli UTI/prostatitis.  Complete course of Augmentin  12.  CKD stage IV.  Baseline creatinine 2.0-2.2.  Follow-up chemistries on Monday 13.  GERD/abdominal pain/nausea and vomiting.  Seen by GI recommended empiric PPI daily, Carafate . I see no recs for flagyl ---stopped 2/8  - No plans for endoscopic intervention at this time             -no recorded  emesis since I saw him in consult earlier this week 14.  Hyperlipidemia.  Lipitor  15.  History of mesenteric artery stenosis.  Follow-up outpatient 16.  Anemia of chronic disease.  Continue iron supplement.  Occult blood negative.   Follow-up CBC Monday    LOS: 2 days A FACE TO FACE EVALUATION WAS PERFORMED  Brandon Harris 03/21/2023, 8:19 AM

## 2023-03-22 DIAGNOSIS — I10 Essential (primary) hypertension: Secondary | ICD-10-CM | POA: Diagnosis not present

## 2023-03-22 DIAGNOSIS — I4891 Unspecified atrial fibrillation: Secondary | ICD-10-CM | POA: Diagnosis not present

## 2023-03-22 DIAGNOSIS — M19072 Primary osteoarthritis, left ankle and foot: Secondary | ICD-10-CM | POA: Diagnosis not present

## 2023-03-22 DIAGNOSIS — I63511 Cerebral infarction due to unspecified occlusion or stenosis of right middle cerebral artery: Secondary | ICD-10-CM | POA: Diagnosis not present

## 2023-03-22 LAB — CBC WITH DIFFERENTIAL/PLATELET
Abs Immature Granulocytes: 0.04 10*3/uL (ref 0.00–0.07)
Basophils Absolute: 0 10*3/uL (ref 0.0–0.1)
Basophils Relative: 0 %
Eosinophils Absolute: 0.2 10*3/uL (ref 0.0–0.5)
Eosinophils Relative: 2 %
HCT: 27.3 % — ABNORMAL LOW (ref 39.0–52.0)
Hemoglobin: 8.9 g/dL — ABNORMAL LOW (ref 13.0–17.0)
Immature Granulocytes: 1 %
Lymphocytes Relative: 24 %
Lymphs Abs: 1.8 10*3/uL (ref 0.7–4.0)
MCH: 30.2 pg (ref 26.0–34.0)
MCHC: 32.6 g/dL (ref 30.0–36.0)
MCV: 92.5 fL (ref 80.0–100.0)
Monocytes Absolute: 0.9 10*3/uL (ref 0.1–1.0)
Monocytes Relative: 12 %
Neutro Abs: 4.6 10*3/uL (ref 1.7–7.7)
Neutrophils Relative %: 61 %
Platelets: 321 10*3/uL (ref 150–400)
RBC: 2.95 MIL/uL — ABNORMAL LOW (ref 4.22–5.81)
RDW: 14 % (ref 11.5–15.5)
WBC: 7.5 10*3/uL (ref 4.0–10.5)
nRBC: 0 % (ref 0.0–0.2)

## 2023-03-22 LAB — COMPREHENSIVE METABOLIC PANEL
ALT: 12 U/L (ref 0–44)
AST: 16 U/L (ref 15–41)
Albumin: 2.3 g/dL — ABNORMAL LOW (ref 3.5–5.0)
Alkaline Phosphatase: 44 U/L (ref 38–126)
Anion gap: 14 (ref 5–15)
BUN: 29 mg/dL — ABNORMAL HIGH (ref 8–23)
CO2: 22 mmol/L (ref 22–32)
Calcium: 9.2 mg/dL (ref 8.9–10.3)
Chloride: 100 mmol/L (ref 98–111)
Creatinine, Ser: 1.85 mg/dL — ABNORMAL HIGH (ref 0.61–1.24)
GFR, Estimated: 34 mL/min — ABNORMAL LOW (ref >60)
Glucose, Bld: 95 mg/dL (ref 70–99)
Potassium: 4.1 mmol/L (ref 3.5–5.1)
Sodium: 136 mmol/L (ref 135–145)
Total Bilirubin: 0.4 mg/dL (ref 0.0–1.2)
Total Protein: 5.5 g/dL — ABNORMAL LOW (ref 6.5–8.1)

## 2023-03-22 MED ORDER — CARVEDILOL 25 MG PO TABS
25.0000 mg | ORAL_TABLET | Freq: Two times a day (BID) | ORAL | Status: DC
  Administered 2023-03-22 – 2023-03-30 (×16): 25 mg via ORAL
  Filled 2023-03-22 (×16): qty 1

## 2023-03-22 NOTE — IPOC Note (Addendum)
 Overall Plan of Care [facility]) Patient Details Name: Brandon Harris MRN: [id number] DOB: Feb 07, 1929  Admitting Diagnosis: Right middle cerebral artery stroke [facility])  Hospital Problems: Principal Problem:   Right middle cerebral artery stroke (HCC)     Functional Problem List: Nursing Safety, Endurance, Medication Management, Bladder  PT Balance, Edema, Endurance, Motor, Pain, Safety  OT Balance, Perception, Safety, Cognition, Edema, Skin Integrity, Endurance, Vision, Motor, Pain  SLP Safety, Cognition  TR         Basic ADL's: OT Toileting, Dressing, Bathing, Grooming     Advanced  ADL's: OT       Transfers: PT Bed Mobility, Bed to Chair, Banker, Toilet     Locomotion: PT Ambulation, Stairs     Additional Impairments: OT    SLP Social Cognition   Problem Solving, Memory, Attention  TR      Anticipated Outcomes Item Anticipated Outcome  Self Feeding Independent  Swallowing      Basic self-care  CGA-min A  Toileting  CGA- min A   Bathroom Transfers CGA  Bowel/Bladder  manage  bladder w toileting  Transfers  CGA + LRAD  Locomotion  CGA + LRAD  Communication     Cognition  Supervision A  Pain  n/a  Safety/Judgment  manage safety w cues   Therapy Plan: PT Intensity: Minimum of 1-2 x/day ,45 to 90 minutes PT Frequency: 5 out of 7 days PT Duration Estimated Length of Stay: 14-21 days OT Intensity: Minimum of 1-2 x/day, 45 to 90 minutes OT Frequency: 5 out of 7 days OT Duration/Estimated Length of Stay: 2-3 weeks SLP Intensity: Minumum of 1-2 x/day, 30 to 90 minutes SLP Frequency: 3 to 5 out of 7 days SLP Duration/Estimated Length of Stay: 11-15 days   Team Interventions: Nursing Interventions Disease Management/Prevention, Patient/Family Education, Bowel Management, Bladder Management, Medication Management, Discharge Planning  PT interventions Ambulation/gait training, Community reintegration, DME/adaptive equipment instruction,  Neuromuscular re-education, Psychosocial support, Stair training, UE/LE Strength taining/ROM, Warden/ranger, Discharge planning, Pain management, Skin care/wound management, Therapeutic Activities, UE/LE Coordination activities, Cognitive remediation/compensation, Disease management/prevention, Functional mobility training, Patient/family education, Therapeutic Exercise  OT Interventions Balance/vestibular training, Discharge planning, Pain management, Self Care/advanced ADL retraining, Therapeutic Activities, UE/LE Coordination activities, Cognitive remediation/compensation, Disease mangement/prevention, Functional mobility training, Patient/family education, Skin care/wound managment, Therapeutic Exercise, Visual/perceptual remediation/compensation, Community reintegration, Fish farm manager, Neuromuscular re-education, Psychosocial support, Splinting/orthotics, UE/LE Strength taining/ROM, Wheelchair propulsion/positioning  SLP Interventions Cognitive remediation/compensation, Internal/external aids, Functional tasks, Patient/family education  TR Interventions    SW/CM Interventions Discharge Planning, Psychosocial Support, Patient/Family Education   Barriers to Discharge MD  Medical stability and new onset ankle pain due to OA  Nursing Home environment access/layout, Decreased caregiver support 1 level ramped entry right rail. He needs PRN assist for bed mobility, transfers, gait. Pt reports he's ambulatory with rollator, denies falls, still drives to church.  ADLs Comments: Patient needs occsional assist with donning/ doffing jackets or long sleeve shirts.Patient reports he prefers to take tub baths and has grab bars he uses to get in and out of the tub.  PT Other (comments) new L ankle pain restricting weight bearing  OT      SLP      SW       Team Discharge Planning: Destination: PT-Home ,OT- Home , SLP-Home Projected Follow-up: PT-Home health PT, OT-  Home  health OT, SLP-None Projected Equipment Needs: PT-To be determined, OT- To be determined, SLP-To be determined Equipment Details: PT-has RW and rollator,  OT-  Patient/family involved in discharge planning: PT- Patient, Family member/caregiver,  OT-Patient, Family member/caregiver, SLP-Patient, Family member/caregiver  MD ELOS: 14-18d Medical Rehab Prognosis:  Good Assessment: The patient has been admitted for CIR therapies with the diagnosis of R MCA stroke . The team will be addressing functional mobility, strength, stamina, balance, safety, adaptive techniques and equipment, self-care, bowel and bladder mgt, patient and caregiver education, BP management. Goals have been set at min/Sup. Anticipated discharge destination is home with family assist .        See Team Conference Notes for weekly updates to the plan of care

## 2023-03-22 NOTE — Progress Notes (Signed)
 [name], male   DOB: [birth year], 89 y.o.   MRN: [id number] Met with the patient and dtr to review current medical situation, rehab process, team conference and plan of care. Reviewed secondary risk management including CAD and new A-fib on Eliquis . Reviewed secondary risk management including HTN and HLD on Lipitor . Active Mychart. Continue to follow along to address educational needs to facilitate preparation for discharge

## 2023-03-22 NOTE — Progress Notes (Signed)
Physical Therapy Session Note  Patient Details  Name: Brandon Harris MRN: [id number] Date of Birth: [birth year]  Today's Date: 03/22/2023 PT Individual Time: 0847-1000 PT Individual Time Calculation (min): 73 min   Short Term Goals: Week 1:  PT Short Term Goal 1 (Week 1): Pt will transfer supine > sit  using minA PT Short Term Goal 2 (Week 1): Pt will complete sit to stand using minA + LRAD PT Short Term Goal 3 (Week 1): Pt will maintain dynamic standing balance using CGA PT Short Term Goal 4 (Week 1): Pt will complete amb of 35ft using MinA + LRAD PT Short Term Goal 5 (Week 1): Pt will initiate stair training  Skilled Therapeutic Interventions/Progress Updates:  Patient supine in bed on entrance to room. Dtr also in room. Patient alert and agreeable to PT session.   Patient with pain complaint at start of session of "crick in neck". Has difficulty turning head to L side.   Voltaren gel applied to pt's L ankle.   Therapeutic Activity/ NMR: Bed Mobility: Pt encouraged to participate in LB dressing by lifting each LE to thread pants, but only able to minimally lift RLE requiring MaxA. Is able to perform supine > sit with initiation of BLE toward L EOB. Requires MinA to push up to seated position on EOB. Is able to hold seated position with BLE supported on floor.  Transfers: Pt performed sit<>stand from EOB with BUE grasp above this therapist's elbows. Has definite posterior lean but is able to maintain stance while completing donning of pt's pants and initiates step pivot to reach w/c to pt's R side. All with MinA to maintain standing balance from posterior LOB.   Sit<> stand and stand pivot transfers throughout session with Min/ mod A initially and improves to light MinA/ CGA by end of session with use of RW. Provided vc/ tc for technique as described in NMR below.  Neuromuscular Re-ed: NMR facilitated during session with focus on standing balance, improved biomechanics in transfers. Pt  guided in sit<>stand to no AD and therapist's HHA to L side. Pt able to follow instructions well for hand placement in push-to-stand technique, forward scoot, rearward foot positioning, forward hip hinge and weight shift. Completes blocked practice x6 with improved technique to RW by end of session.   NMR performed for improvements in motor control and coordination, balance, sequencing, judgement, and self confidence/ efficacy in performing all aspects of mobility at highest level of independence.   Gait Training:  Pt ambulated 43 ft using RW with CGA/ MinA. Demonstrated flexed posture, downward gaze, decreased but adequate step height bilaterally. Provided vc/ tc for upright posture and level gaze.  Patient supine in bed at end of session with brakes locked, bed alarm set, and all needs within reach.  Therapy Documentation Precautions:  Precautions Precautions: Fall Restrictions Weight Bearing Restrictions Per Provider Order: No  Pain:  Improved ankle pain this session. Neck pain improved with mobility and MHP for 20 min. Dtr relates heating pad "cape" in room for pt.   Therapy/Group: Individual Therapy  Loel Dubonnet PT, DPT, CSRS 03/22/2023, 5:24 PM

## 2023-03-22 NOTE — Progress Notes (Signed)
 Occupational Therapy Session Note  Patient Details  Name: Brandon Harris MRN: [id number] Date of Birth: 04/29/1928  Today's Date: 03/22/2023 OT Individual Time: 1100-1200 OT Individual Time Calculation (min): 60 min  PM Session:  OT Individual Time: 1421-1536 OT Individual Time Calculation (min): 75 min   Short Term Goals: Week 1:  OT Short Term Goal 1 (Week 1): Pt will complete sit<>stands during BADLs with mod A x1 consistly using LRAD OT Short Term Goal 2 (Week 1): Pt will complete toilet transfers mod A using LRAD OT Short Term Goal 3 (Week 1): Pt will tolerate standing >1 minute during functional tasks with min A using LRAD OT Short Term Goal 4 (Week 1): Pt will complete grooming/hygiene tasks with min A  Skilled Therapeutic Interventions/Progress Updates:     AM Session:  Pt received semi-reclined in bed presenting to be in good spirits receptive to skilled OT session reporting 6/10 pain in his neck- OT offering intermittent rest breaks, repositioning, and therapeutic support to optimize participation in therapy session.   BADLs:  -Pt completed U/LB dressing seated EOB. Pt able to doff shirt using hemi-technique with supervision +increased time. Pt donn clean shirt with min A required for orienting sleeve and weaving L UE into sleeve d/t kyphotic posturing. Pt able to len forwards to weave B LEs into pant with CGA provided for balance with maximal effort required. Pt stood from EOB with mod A to RW with verbal cues required for sit<>stand technique and brought pants to waist with min A provided for balance.   Mobility:  -Bed mobility: Pt transitioned to EOB using bed rails with supervision +increased time this session.  -Functional mobility: Pt completed ~20 ft of functional mobility x3 trial using RW for endurance training and to increase safety and independence in functional mobility. Focused task on head positioning, RW management, and safety awareness. Pt required min A overall  and mod-[place] verbal/tactile cues for head/trunk alignment.  Therapeutic Activity:  -Engaged Pt in dynamic standing balance horse shoes toss activity with blocked practice of sit <> stand incorporated into task. Provided education on sit <> stand technique with emphasis on hand placement, sitting close to EOM, feet placement, and anterior weight shifting with Pt receptive to education. Utilized Environmental education officer during activity to support Pt in learning sit<>stand technique with Pt initially requiring mod verbal cues to implement technique fading to min questioning cues by end of task. Pt able to complete sit <> stands this session with min A overall with assistance required to fully power up to standing position and facilitate anterior weight shifting. Pt maintained standing balance with single UE support on RW while tossing horse shoe with min A overall 5/5 trials. Prolonged seated rest breaks provided between and following each trial.   Pt was left resting in wc with call bell in reach, seatbelt alarm on, and all needs met.    PM Session:  Pt received lightly sleeping in bed upon OT arrival with DTR present in room. Pt presenting to be in good spirits receptive to skilled OT session reporting 8/10 pain in neck- OT offering intermittent rest breaks, repositioning, and therapeutic support to optimize participation in therapy session.   Mobility:  -Bed Mobility: Pt transitioned to EOB using bed features with min A this session 2/2 pain in neck with assistance required for lifting trunk. At end of session, Pt returned to bed with min A to lift B LEs into bed d/t fatigue.  -Functional mobility: Pt completed 4 trials of  functional mobility ~30 ft using RW this session for endurance and ambulatory transfer training. Pt requiring light min A this session for balance and RW management +mod verbal cues for head/trunk positioning as Pt presents with forward flexed postural alignment with decreased hip extension.  Worked on navigating through narrow walkways and around obstacles to simulate ambulating within his home environment with Pt able to complete task using RW with min A and min verbal cues for RW management.   Manual Therapy:  -Completed myofacial release of Pt's trapezius, supraspinatus, and posterior deltoids to decrease neck pain and increase ROM for functional activities with noted improvement following. Pt then completed gentle self AROM neck stretches of lateral flexion, head rolls, and neck rotation.   Therapeutic Exercises:  -Engaged Pt in completing B U/LE reciprocal arm and leg movement exercises on NuStep to increase overall activity tolerance for BADLs. Pt able to tolerate completing 2 trials of 5 minutes of level 5 setting with short rest break provided between and following trials.   Therapeutic Activities:  -Standing at elevated table, engaged Pt in dynamic standing balance connect-four game to work on standing tolerance, FMC, and dynamic standing balance for increased safety during BADLs. Pt abel to tolerate standing with RW support ~4 minutes x2 trials with seated rest breaks provided following. Pt able to recall correct hand placement and technique for sit <> stands this session with min questioning cues demonstrating improved carryover from previous session.   Pt was left resting in bed with call bell in reach, bed alarm on, and all needs met.    Therapy Documentation Precautions:  Precautions Precautions: Fall Restrictions Weight Bearing Restrictions Per Provider Order: No  Therapy/Group: Individual Therapy  Geoffery Kiel 03/22/2023, 7:50 AM

## 2023-03-22 NOTE — Progress Notes (Signed)
 Inpatient Rehabilitation Center Individual Statement of Services  Patient Name:  Brandon Harris  Date:  03/22/2023  Welcome to the Inpatient Rehabilitation Center.  Our goal is to provide you with an individualized program based on your diagnosis and situation, designed to meet your specific needs.  With this comprehensive rehabilitation program, you will be expected to participate in at least 3 hours of rehabilitation therapies Monday-Friday, with modified therapy programming on the weekends.  Your rehabilitation program will include the following services:  Physical Therapy (PT), Occupational Therapy (OT), Speech Therapy (ST), 24 hour per day rehabilitation nursing, Therapeutic Recreaction (TR), Neuropsychology, Care Coordinator, Rehabilitation Medicine, Nutrition Services, and Pharmacy Services  Weekly team conferences will be held on Wednesday to discuss your progress.  Your Inpatient Rehabilitation Care Coordinator will talk with you frequently to get your input and to update you on team discussions.  Team conferences with you and your family in attendance may also be held.  Expected length of stay: 14-21 days  Overall anticipated outcome: CGA-min level  Depending on your progress and recovery, your program may change. Your Inpatient Rehabilitation Care Coordinator will coordinate services and will keep you informed of any changes. Your Inpatient Rehabilitation Care Coordinator's name and contact numbers are listed  below.  The following services may also be recommended but are not provided by the Inpatient Rehabilitation Center:   Home Health Rehabiltiation Services Outpatient Rehabilitation Services    Arrangements will be made to provide these services after discharge if needed.  Arrangements include referral to agencies that provide these services.  Your insurance has been verified to be:  [facility] Medicare Your primary doctor is:  Rae Bugler  Pertinent information will be shared with  your doctor and your insurance company.  Inpatient Rehabilitation Care Coordinator:  Adrianna Albee, [place] [phone] or Justine Oms  Information discussed with and copy given to patient by: Mardell Shade, 03/22/2023, 8:48 AM

## 2023-03-22 NOTE — Progress Notes (Signed)
 Inpatient Rehabilitation  Patient information reviewed and entered into eRehab system by [facility]. Karen Kays., CCC/SLP, PPS Coordinator.  Information including medical coding, functional ability and quality indicators will be reviewed and updated through discharge.

## 2023-03-22 NOTE — Progress Notes (Signed)
 Orthopedic Tech Progress Note Patient Details:  Brandon Harris Jun 28, 1928 [id number]  Went to apply CAM WALKER BOOT to patient. Love one in room stated patient was in the gym, went to both gyms and patient was not there. Will try again sometime today  Patient ID: [name], male   DOB: [birth year], 89 y.o.   MRN: [id number]  Kermitt Pedlar 03/22/2023, 11:38 AM

## 2023-03-22 NOTE — Progress Notes (Addendum)
 PROGRESS NOTE   Subjective/Complaints:  Left ankle hurts only with WB Discussed poor intake and goal of at least 1 qt per day  ROS: Patient denies CP, SOB, N/V/D   Objective:   No results found.  Recent Labs    03/21/23 1249 03/22/23 0522  WBC 9.3 7.5  HGB 8.7* 8.9*  HCT 26.1* 27.3*  PLT 304 321   Recent Labs    03/21/23 1249 03/22/23 0522  NA 132* 136  K 3.9 4.1  CL 103 100  CO2 19* 22  GLUCOSE 102* 95  BUN 31* 29*  CREATININE 1.93* 1.85*  CALCIUM  8.5* 9.2    Intake/Output Summary (Last 24 hours) at 03/22/2023 0826 Last data filed at 03/22/2023 0727 Gross per 24 hour  Intake 836 ml  Output 1550 ml  Net -714 ml        Physical Exam: Vital Signs Blood pressure (!) 147/89, pulse 86, temperature 97.8 F (36.6 C), temperature source Oral, resp. rate 18, height 5\' 11"  (1.803 m), weight 80.7 kg, SpO2 99%.   General: No acute distress Mood and affect are appropriate Heart: Regular rate and rhythm no rubs murmurs or extra sounds Lungs: Clear to auscultation, breathing unlabored, no rales or wheezes Abdomen: Positive bowel sounds, soft nontender to palpation, nondistended Extremities: No clubbing, cyanosis, or edema Skin: No evidence of breakdown, no evidence of rash   Neuro:  pt is fairly alert, oriented. Fair insight and awareness. Speech still dysarthric. Normal language. CN notable for left CN VII, left inattetion. Shoulders both limited by ortho. RUE otherwise is 4 to 4+/5 and LUE is 3+ to 4/5. RLE is 3/5. LLE is 3 to 3+/5 prox to distal. . No frank limb ataxia or cerebellar signs. No abnormal tone appreciated. Exam stable 2/10 Musculoskeletal: both shoulders limited in PROM/AROM.  Left ankle, non tender no evidence of effusion,    Assessment/Plan: 1. Functional deficits which require 3+ hours per day of interdisciplinary therapy in a comprehensive inpatient rehab setting. Physiatrist is providing  close team supervision and 24 hour management of active medical problems listed below. Physiatrist and rehab team continue to assess barriers to discharge/monitor patient progress toward functional and medical goals  Care Tool:  Bathing    Body parts bathed by patient: Right arm, Left arm, Face, Front perineal area, Chest, Abdomen   Body parts bathed by helper: Right upper leg, Left upper leg, Buttocks, Left lower leg, Right lower leg     Bathing assist Assist Level: Maximal Assistance - Patient 24 - 49%     Upper Body Dressing/Undressing Upper body dressing   What is the patient wearing?: Pull over shirt    Upper body assist Assist Level: Maximal Assistance - Patient 25 - 49%    Lower Body Dressing/Undressing Lower body dressing      What is the patient wearing?: Pants     Lower body assist Assist for lower body dressing: Maximal Assistance - Patient 25 - 49%     Toileting Toileting    Toileting assist Assist for toileting: Maximal Assistance - Patient 25 - 49% (simulated)     Transfers Chair/bed transfer  Transfers assist     Chair/bed transfer  assist level: Maximal Assistance - Patient 25 - 49%     Locomotion Ambulation   Ambulation assist   Ambulation activity did not occur: Safety/medical concerns (limited 2/2 high ankle pain)          Walk 10 feet activity   Assist  Walk 10 feet activity did not occur: Safety/medical concerns (limited 2/2 high ankle pain)        Walk 50 feet activity   Assist Walk 50 feet with 2 turns activity did not occur: Safety/medical concerns (limited 2/2 high ankle pain)         Walk 150 feet activity   Assist Walk 150 feet activity did not occur: Safety/medical concerns (limited 2/2 high ankle pain)         Walk 10 feet on uneven surface  activity   Assist Walk 10 feet on uneven surfaces activity did not occur: Safety/medical concerns (limited 2/2 high ankle pain)          Wheelchair     Assist Is the patient using a wheelchair?: Yes Type of Wheelchair: Manual    Wheelchair assist level: Dependent - Patient 0% [place] wheelchair distance: 352ft    Wheelchair 50 feet with 2 turns activity    Assist        Assist Level: Dependent - Patient 0%   Wheelchair 150 feet activity     Assist      Assist Level: Dependent - Patient 0%   Blood pressure (!) 147/89, pulse 86, temperature 97.8 F (36.6 C), temperature source Oral, resp. rate 18, height 5\' 11"  (1.803 m), weight 80.7 kg, SpO2 99%.  Medical Problem List and Plan: 1. 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  Antithrombotics: -DVT/anticoagulation:  Pharmaceutical: Eliquis              -antiplatelet therapy: N/A 3. 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 No  c/o pain in this area             -now with pain and sl swelling around left ankle without known injury             - xray of left ankle with arthritic changes tib-talar joint and soft tissue swelling of heel and plantar foot             -continue voltaren  gel to right chest wall and ankle TID.   -needs appropriate shoewear when up with therapy       Will order CAM walker boot   -2/9 no specific problems noted by therapy--monitor for now 4. Mood/Behavior/Sleep: Remeron  15 mg nightly.  Xanax  0.25 mg nightly as needed             -antipsychotic agents: N/A 5. Neuropsych/cognition: This patient is capable of making decisions on his own behalf. 6. Skin/Wound Care: Routine skin checks 7. Fluids/Electrolytes/Nutrition: Routine and analysis follow-up chemistries 8.  Newly diagnosed A-fib with RVR.  Continue Eliquis  as well as Coreg  12.5 mg twice daily.   Plan outpatient follow-up consider DCCV  -remains in a fib, rate controlled  9.  CAD/CABG.  Imdur  15 mg daily. 10.  Hypertension.  Monitor with increased mobility.  Hydralazine  and Norvasc  currently on hold due to soft blood pressure and resume as needed   Vitals:   03/21/23 1953 03/22/23 0413  BP: (!) 152/73 (!) 147/89  Pulse: 92 86  Resp: 16 18  Temp: 97.6 F (36.4 C) 97.8 F (36.6 C)  SpO2: 98% 99%  Increase coreg   11.  History of prostate cancer with recent E. coli UTI/prostatitis.  Complete course of Augmentin  12.  CKD stage IV.  Baseline creatinine 2.0-2.2.  Follow-up chemistries on Monday    Latest Ref Rng & Units 03/22/2023    5:22 AM 03/21/2023   12:49 PM 03/19/2023    6:31 AM  BMP  Glucose 70 - 99 mg/dL 95  161  92   BUN 8 - 23 mg/dL 29  31  24    Creatinine 0.61 - 1.24 mg/dL 0.96  0.45  4.09   Sodium 135 - 145 mmol/L 136  132  135   Potassium 3.5 - 5.1 mmol/L 4.1  3.9  3.8   Chloride 98 - 111 mmol/L 100  103  105   CO2 22 - 32 mmol/L 22  19  20    Calcium  8.9 - 10.3 mg/dL 9.2  8.5  8.4    Creat at baseline, fluid intake poor BUN elevated over baseline  13.  GERD/abdominal pain/nausea and vomiting.  Seen by GI recommended empiric PPI daily, Carafate . I see no recs for flagyl ---stopped 2/8  - No plans for endoscopic intervention at this time             -no recorded emesis since I saw him in consult earlier this week 14.  Hyperlipidemia.  Lipitor  15.  History of mesenteric artery stenosis.  Follow-up outpatient 16.  Anemia of chronic disease.  Continue iron supplement.  Occult blood negative.   Follow-up CBC stable      Latest Ref Rng & Units 03/22/2023    5:22 AM 03/21/2023   12:49 PM 03/19/2023    6:31 AM  CBC  WBC 4.0 - 10.5 K/uL 7.5  9.3  11.5   Hemoglobin 13.0 - 17.0 g/dL 8.9  8.7  9.5   Hematocrit 39.0 - 52.0 % 27.3  26.1  28.7   Platelets 150 - 400 K/uL 321  304  293      LOS: 3 days A FACE TO FACE EVALUATION WAS PERFORMED  Genetta Kenning 03/22/2023,  8:26 AM

## 2023-03-22 NOTE — Progress Notes (Signed)
 Inpatient Rehabilitation Care Coordinator Assessment and Plan Patient Details  Name: Brandon Harris MRN: [id number] Date of Birth: 1928/08/24  Today's Date: 03/22/2023  Hospital Problems: Principal Problem:   Right middle cerebral artery stroke [facility])  Past Medical History:  Past Medical History:  Diagnosis Date   Arthritis    Carotid arterial disease (HCC)    Chronic kidney disease (CKD), stage IV (severe) (HCC)    Diverticulosis    colonic diverticulosis   Gallstones    GERD (gastroesophageal reflux disease)    Hypertension    Myocardial infarction [facility])    Prostate cancer (HCC)    Status post coronary artery bypass grafting     intraoperative cholangiogram   Stroke [facility])    patient reports no deficits    Urinary incontinence    bladder leakage - " patient wears a pad"   Past Surgical History:  Past Surgical History:  Procedure Laterality Date   BACK SURGERY     x 4, has titanium in back per patient    CHOLECYSTECTOMY, LAPAROSCOPIC     CORONARY ARTERY BYPASS GRAFT  2001   CRYOTHERAPY     Prostate Cancer   LEFT HEART CATH AND CORS/GRAFTS ANGIOGRAPHY N/A 10/27/2017   Procedure: LEFT HEART CATH AND CORS/GRAFTS ANGIOGRAPHY;  Surgeon: Arty Binning, MD;  Location: [facility] INVASIVE CV LAB;  Service: Cardiovascular;  Laterality: N/A;   prostate cancer     ROTATOR CUFF REPAIR     LEFT   TOTAL KNEE ARTHROPLASTY Right 12/18/2014   Procedure: TOTAL RIGHT KNEE ARTHROPLASTY;  Surgeon: Claiborne Crew, MD;  Location: [facility] ORS;  Service: Orthopedics;  Laterality: Right;   Social History:  reports that he has never smoked. He has never used smokeless tobacco. He reports that he does not drink alcohol and does not use drugs.  Family / Support Systems Marital Status: Widow/Widower Patient Roles: Parent, Other (Comment) (farmer) Children: Janice-daughter [phone] Three other children-2 boys and another girl Other Supports: Church members Anticipated Caregiver: janice whom he lives  with Ability/Limitations of Caregiver: Leola Raisin is retired and can provide Geneticist, molecular Availability: 24/7 Family Dynamics: Close knit with all four children recenlty his son passed away which is still painful for him. He has good supports and daughter is here daily  Social History Preferred language: English Religion: Quaker Cultural Background: No issues Education: HS Health Literacy - How often do you need to have someone help you when you read instructions, pamphlets, or other written material from your doctor or pharmacy?: Never Writes: Yes Employment Status: Retired Marine scientist Issues: No issues Guardian/Conservator: No issues-MD feels he is capable of making his own decisions while here   Abuse/Neglect Abuse/Neglect Assessment Can Be Completed: Yes Physical Abuse: Denies Verbal Abuse: Denies Sexual Abuse: Denies Exploitation of patient/patient's resources: Denies Self-Neglect: Denies  Patient response to: Social Isolation - How often do you feel lonely or isolated from those around you?: Never  Emotional Status Pt's affect, behavior and adjustment status: 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 He will work hard and hopefully get there. According to daughter he is very independent and was doing well prior to this CVA Recent Psychosocial Issues: other health issues were being managed-recent death of his son has been hard for him Psychiatric History: No history-issues may benefit from seeing neuro-psych due to recent death of son Substance Abuse History: NA  Patient / Family Perceptions, Expectations & Goals Pt/Family understanding of illness & functional limitations: Pt and daughter  have a good understanding of his stroke and deficits he is making progress and this is encouraging to both of them. Daughter is here daily and talks with the MD's involved. Premorbid pt/family roles/activities: father, grandfather, retriee, farmer,  church member, etc Anticipated changes in roles/activities/participation: resume Pt/family expectations/goals: Pt states: " I hope to do well and get back on my tractors."  Daughter states: " I hope he does well but will help him also."  [employer]: None Premorbid Home Care/DME Agencies: Other (Comment) (rw, bsc, wc, grab bars cane) Transportation available at discharge: daughter Is the patient able to respond to transportation needs?: Yes In the past 12 months, has lack of transportation kept you from medical appointments or from getting medications?: No In the past 12 months, has lack of transportation kept you from meetings, work, or from getting things needed for daily living?: No Resource referrals recommended: Neuropsychology  Discharge Planning Living Arrangements: Children Support Systems: Children, Other relatives, Friends/neighbors, Psychologist, clinical community Type of Residence: Private residence Insurance Resources: Media planner (specify) ([employer] Medicare) Financial Resources: Social Security, Family Support Financial Screen Referred: No Living Expenses: Own Money Management: Patient Does the patient have any problems obtaining your medications?: No Home Management: daughter Patient/Family Preliminary Plans: Return home with daughter whom he lives with and she will assist him if needed. He has three other children who are also involved. Will update once team conference and target date set. 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 His daughter-janice is here and very involved and will assist at discharge. Will update on Wednesday after team conference.  Mardell Shade 03/22/2023, 10:37 AM

## 2023-03-23 ENCOUNTER — Other Ambulatory Visit (HOSPITAL_COMMUNITY): Payer: Self-pay

## 2023-03-23 ENCOUNTER — Telehealth (HOSPITAL_COMMUNITY): Payer: Self-pay | Admitting: Pharmacy Technician

## 2023-03-23 DIAGNOSIS — I63511 Cerebral infarction due to unspecified occlusion or stenosis of right middle cerebral artery: Secondary | ICD-10-CM | POA: Diagnosis not present

## 2023-03-23 DIAGNOSIS — M19072 Primary osteoarthritis, left ankle and foot: Secondary | ICD-10-CM | POA: Diagnosis not present

## 2023-03-23 DIAGNOSIS — I4891 Unspecified atrial fibrillation: Secondary | ICD-10-CM | POA: Diagnosis not present

## 2023-03-23 DIAGNOSIS — I10 Essential (primary) hypertension: Secondary | ICD-10-CM | POA: Diagnosis not present

## 2023-03-23 MED ORDER — DICLOFENAC SODIUM 1 % EX GEL
4.0000 g | Freq: Four times a day (QID) | CUTANEOUS | Status: DC
  Administered 2023-03-23 – 2023-03-30 (×22): 4 g via TOPICAL
  Filled 2023-03-23: qty 100

## 2023-03-23 MED ORDER — TRAMADOL HCL 50 MG PO TABS
50.0000 mg | ORAL_TABLET | Freq: Every day | ORAL | Status: DC
  Administered 2023-03-23 – 2023-03-29 (×7): 50 mg via ORAL
  Filled 2023-03-23 (×7): qty 1

## 2023-03-23 NOTE — Progress Notes (Signed)
Occupational Therapy Session Note  Patient Details  Name: Brandon Harris MRN: [id number] Date of Birth: 05-16-1928  Today's Date: 03/23/2023 OT Individual Time: 7829-5621 OT Individual Time Calculation (min): 25 min  and Today's Date: 03/23/2023 OT Missed Time: 35 Minutes Missed Time Reason: Patient fatigue;Pain   Short Term Goals: Week 1:  OT Short Term Goal 1 (Week 1): Pt will complete sit<>stands during BADLs with mod A x1 consistly using LRAD OT Short Term Goal 2 (Week 1): Pt will complete toilet transfers mod A using LRAD OT Short Term Goal 3 (Week 1): Pt will tolerate standing >1 minute during functional tasks with min A using LRAD OT Short Term Goal 4 (Week 1): Pt will complete grooming/hygiene tasks with min A  Skilled Therapeutic Interventions/Progress Updates:     Pt received lightly sleeping in bed upon OT arrival, dressed and ready for the day with all BADL needs met. Pt presenting to be extremely tired this AM stating "I only slept 1 and a half hours last night d/t pain in my neck/back and in my knees from my arthritis, exercising yesterday, and from the weather". Pt reporting 7/10 pain in knees and neck- OT offering intermittent rest breaks, repositioning, and therapeutic support to optimize participation in therapy session. Pt receptive and motivated to participate in session, however struggling to keep his eyes open so session was ended early. Provided education on importance of rest/sleep following CVA to allow for the mind and body to heal, pain management techniques, positioning in bed to increase comfort, and pain managements. Also educated on importance of increasing fluid intake to prevent dehydration and requesting pain medication from RN as appropriate to support improve sleep and participation in session. Assisted Pt with getting comfortable in bed and applied heat packs to bilateral knees and neck for pain management. Provided education on option of changing Pt's schedule  to 15/7 to spread out therapy session over the course of the week and messaged medical team to receive additional input. Pt was left resting in bed with call bell in reach, bed alarm on, and all needs met. Missed 35 minutes of skilled OT treatment d/t fatigue. Will attempt to make up time as schedule and Pt's status allow.   Therapy Documentation Precautions:  Precautions Precautions: Fall Restrictions Weight Bearing Restrictions Per Provider Order: No   Therapy/Group: Individual Therapy  Clide Deutscher 03/23/2023, 7:59 AM

## 2023-03-23 NOTE — Progress Notes (Signed)
Orthopedic Tech Progress Note Patient Details:  Brandon Harris February 08, 1929 [id number]  Spoke with MD this morning and he stated patient did not need boot at this time   [name], male   DOB: 1929-01-17, 89 y.o.   MRN: [id number]  Brandon Harris 03/23/2023, 8:00 AM

## 2023-03-23 NOTE — Progress Notes (Signed)
PROGRESS NOTE   Subjective/Complaints:  No left ankle pain during therapy yesterdya  Had bilateral knee pain last noc  ROS: Patient denies CP, SOB, N/V/D   Objective:   No results found.  Recent Labs    03/21/23 1249 03/22/23 0522  WBC 9.3 7.5  HGB 8.7* 8.9*  HCT 26.1* 27.3*  PLT 304 321   Recent Labs    03/21/23 1249 03/22/23 0522  NA 132* 136  K 3.9 4.1  CL 103 100  CO2 19* 22  GLUCOSE 102* 95  BUN 31* 29*  CREATININE 1.93* 1.85*  CALCIUM 8.5* 9.2    Intake/Output Summary (Last 24 hours) at 03/23/2023 0745 Last data filed at 03/23/2023 0052 Gross per 24 hour  Intake 500 ml  Output 600 ml  Net -100 ml        Physical Exam: Vital Signs Blood pressure (!) 145/70, pulse 85, temperature 98.1 F (36.7 C), temperature source Oral, resp. rate 18, height 5\' 11"  (1.803 m), weight 80.7 kg, SpO2 98%.   General: No acute distress Mood and affect are appropriate Heart: Regular rate and rhythm no rubs murmurs or extra sounds Lungs: Clear to auscultation, breathing unlabored, no rales or wheezes Abdomen: Positive bowel sounds, soft nontender to palpation, nondistended Extremities: No clubbing, cyanosis, or edema Skin: No evidence of breakdown, no evidence of rash   Neuro:  pt is fairly alert, oriented. Fair insight and awareness. Speech still dysarthric. Normal language. CN notable for left CN VII, left inattetion. Shoulders both limited by ortho. RUE otherwise is 4 to 4+/5 and LUE is 3+ to 4/5. RLE is 3/5. LLE is 3 to 3+/5 prox to distal. . No frank limb ataxia or cerebellar signs. No abnormal tone appreciated. Exam stable 2/10 Musculoskeletal: knees non tender without effusion . No pain with ROM Left ankle, non tender no evidence of effusion,    Assessment/Plan: 1. Functional deficits which require 3+ hours per day of interdisciplinary therapy in a comprehensive inpatient rehab setting. Physiatrist is  providing close team supervision and 24 hour management of active medical problems listed below. Physiatrist and rehab team continue to assess barriers to discharge/monitor patient progress toward functional and medical goals  Care Tool:  Bathing    Body parts bathed by patient: Right arm, Left arm, Face, Front perineal area, Chest, Abdomen   Body parts bathed by helper: Right upper leg, Left upper leg, Buttocks, Left lower leg, Right lower leg     Bathing assist Assist Level: Maximal Assistance - Patient 24 - 49%     Upper Body Dressing/Undressing Upper body dressing   What is the patient wearing?: Pull over shirt    Upper body assist Assist Level: Maximal Assistance - Patient 25 - 49%    Lower Body Dressing/Undressing Lower body dressing      What is the patient wearing?: Pants     Lower body assist Assist for lower body dressing: Maximal Assistance - Patient 25 - 49%     Toileting Toileting    Toileting assist Assist for toileting: Maximal Assistance - Patient 25 - 49% (simulated)     Transfers Chair/bed transfer  Transfers assist     Chair/bed transfer  assist level: Maximal Assistance - Patient 25 - 49%     Locomotion Ambulation   Ambulation assist   Ambulation activity did not occur: Safety/medical concerns (limited 2/2 high ankle pain)          Walk 10 feet activity   Assist  Walk 10 feet activity did not occur: Safety/medical concerns (limited 2/2 high ankle pain)        Walk 50 feet activity   Assist Walk 50 feet with 2 turns activity did not occur: Safety/medical concerns (limited 2/2 high ankle pain)         Walk 150 feet activity   Assist Walk 150 feet activity did not occur: Safety/medical concerns (limited 2/2 high ankle pain)         Walk 10 feet on uneven surface  activity   Assist Walk 10 feet on uneven surfaces activity did not occur: Safety/medical concerns (limited 2/2 high ankle pain)          Wheelchair     Assist Is the patient using a wheelchair?: Yes Type of Wheelchair: Manual    Wheelchair assist level: Dependent - Patient 0% Rh[place] wheelchair distance: 360ft    Wheelchair 50 feet with 2 turns activity    Assist        Assist Level: Dependent - Patient 0%   Wheelchair 150 feet activity     Assist      Assist Level: Dependent - Patient 0%   Blood pressure (!) 145/70, pulse 85, temperature 98.1 F (36.7 C), temperature source Oral, resp. rate 18, height 5\' 11"  (1.803 m), weight 80.7 kg, SpO2 98%.  Medical Problem List and Plan: 1. 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  Antithrombotics: -DVT/anticoagulation:  Pharmaceutical: Eliquis             -antiplatelet therapy: N/A 3. 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 No  c/o pain in this area             -now with pain and sl swelling around left ankle without known injury             - xray of left ankle with arthritic changes tib-talar joint and soft tissue swelling of heel and plantar foot             -continue voltaren gel to right chest wall and ankle TID.   -needs appropriate shoewear when up with therapy Ordered  CAM walker boot but ambulated without it in therapy no c/o yesterday   Bilateral knee pain OA add voltaren gel  4. Mood/Behavior/Sleep: Remeron 15 mg nightly.  Xanax 0.25 mg nightly as needed             -antipsychotic agents: N/A 5. Neuropsych/cognition: This patient is capable of making decisions on his own behalf. 6. Skin/Wound Care: Routine skin checks 7. Fluids/Electrolytes/Nutrition: Routine and analysis follow-up chemistries 8.  Newly diagnosed A-fib with RVR.  Continue Eliquis as well as Coreg  12.5 mg twice daily.  Plan outpatient follow-up consider DCCV  -remains in a fib,  rate controlled 9.  CAD/CABG.  Imdur 15 mg daily. 10.  Hypertension.  Monitor with increased mobility.  Hydralazine and Norvasc currently on hold due to soft blood pressure and resume as needed   Vitals:   03/22/23 1947 03/23/23 0338  BP: 131/80 (!) 145/70  Pulse: 87 85  Resp: 17 18  Temp: 97.7 F (36.5 C) 98.1 F (36.7 C)  SpO2: 99% 98%  Increase coreg  11.  History of prostate cancer with recent E. coli UTI/prostatitis.  Complete course of Augmentin 12.  CKD stage IV.  Baseline creatinine 2.0-2.2.  Follow-up chemistries on Monday    Latest Ref Rng & Units 03/22/2023    5:22 AM 03/21/2023   12:49 PM 03/19/2023    6:31 AM  BMP  Glucose 70 - 99 mg/dL 95  098  92   BUN 8 - 23 mg/dL 29  31  24    Creatinine 0.61 - 1.24 mg/dL 1.19  1.47  8.29   Sodium 135 - 145 mmol/L 136  132  135   Potassium 3.5 - 5.1 mmol/L 4.1  3.9  3.8   Chloride 98 - 111 mmol/L 100  103  105   CO2 22 - 32 mmol/L 22  19  20    Calcium 8.9 - 10.3 mg/dL 9.2  8.5  8.4    Creat at baseline, fluid intake poor BUN elevated over baseline  13.  GERD/abdominal pain/nausea and vomiting.  Seen by GI recommended empiric PPI daily, Carafate. I see no recs for flagyl---stopped 2/8  - No plans for endoscopic intervention at this time             -no recorded emesis since I saw him in consult earlier this week 14.  Hyperlipidemia.  Lipitor 15.  History of mesenteric artery stenosis.  Follow-up outpatient 16.  Anemia of chronic disease.  Continue iron supplement.  Occult blood negative.   Follow-up CBC stable      Latest Ref Rng & Units 03/22/2023    5:22 AM 03/21/2023   12:49 PM 03/19/2023    6:31 AM  CBC  WBC 4.0 - 10.5 K/uL 7.5  9.3  11.5   Hemoglobin 13.0 - 17.0 g/dL 8.9  8.7  9.5   Hematocrit 39.0 - 52.0 % 27.3  26.1  28.7   Platelets 150 - 400 K/uL 321  304  293      LOS: 4 days A FACE TO FACE EVALUATION WAS PERFORMED  Erick Colace 03/23/2023, 7:45 AM

## 2023-03-23 NOTE — Telephone Encounter (Signed)
Patient Product/process development scientist completed.    The patient is insured through [facility]. Patient has Medicare and is not eligible for a copay card, but may be able to apply for patient assistance or Medicare RX Payment Plan (Patient Must reach out to their plan, if eligible for payment plan), if available.    Ran test claim for Eliquis 2.5 mg and the current 30 day co-pay is $302.00 due to a $255.00 deductible.  Will be $47.00 once deductible is met.   This test claim was processed through [facility]- copay amounts may vary at other pharmacies due to pharmacy/plan contracts, or as the patient moves through the different stages of their insurance plan.     Brandon Harris, CPHT Pharmacy Technician III Certified Patient Advocate [facility] Patient Advocate Team Direct Number: [phone]  Fax: [phone]

## 2023-03-23 NOTE — Progress Notes (Signed)
SLP Cancellation Note  Patient Details Name: [name] MRN: [id number] DOB: 09-Apr-1928   Cancelled treatment: SLP arrived for scheduled 60 minute speech therapy session. Pt in bed with eyes closed. Dtr at bedside, fielding questions intermittently. Pt easily alerted with verbal stimulation however, noted with decreased speech intelligibility. Pt endorsed fatigue along with neck and leg pain. Pt sustaining alertness for limited amounts of time before closing his eyes and requiring re- alerting. Session discontinued due to fatigue. SLP plan to make up missed minutes as clinician/ patient schedules allow. SLP to continue POC.                                                                                               Zollie Pee Mithra Spano 03/23/2023, 12:22 PM

## 2023-03-24 DIAGNOSIS — I4891 Unspecified atrial fibrillation: Secondary | ICD-10-CM | POA: Diagnosis not present

## 2023-03-24 DIAGNOSIS — I63511 Cerebral infarction due to unspecified occlusion or stenosis of right middle cerebral artery: Secondary | ICD-10-CM | POA: Diagnosis not present

## 2023-03-24 DIAGNOSIS — I10 Essential (primary) hypertension: Secondary | ICD-10-CM | POA: Diagnosis not present

## 2023-03-24 DIAGNOSIS — M19072 Primary osteoarthritis, left ankle and foot: Secondary | ICD-10-CM | POA: Diagnosis not present

## 2023-03-24 NOTE — Progress Notes (Signed)
 PROGRESS NOTE   Subjective/Complaints: No pain c/o Discussed pt with urology , Dr Marlou Porch who will see pt in April, plans to order DEXA at that time  ROS: Patient denies CP, SOB, N/V/D   Objective:   No results found.  Recent Labs    03/21/23 1249 03/22/23 0522  WBC 9.3 7.5  HGB 8.7* 8.9*  HCT 26.1* 27.3*  PLT 304 321   Recent Labs    03/21/23 1249 03/22/23 0522  NA 132* 136  K 3.9 4.1  CL 103 100  CO2 19* 22  GLUCOSE 102* 95  BUN 31* 29*  CREATININE 1.93* 1.85*  CALCIUM 8.5* 9.2    Intake/Output Summary (Last 24 hours) at 03/24/2023 0839 Last data filed at 03/24/2023 0630 Gross per 24 hour  Intake 595 ml  Output 500 ml  Net 95 ml        Physical Exam: Vital Signs Blood pressure 138/61, pulse 87, temperature (!) 97.5 F (36.4 C), resp. rate 16, height 5\' 11"  (1.803 m), weight 80.7 kg, SpO2 96%.   General: No acute distress Mood and affect are appropriate Heart: Regular rate and rhythm no rubs murmurs or extra sounds Lungs: Clear to auscultation, breathing unlabored, no rales or wheezes Abdomen: Positive bowel sounds, soft nontender to palpation, nondistended Extremities: No clubbing, cyanosis, or edema Skin: No evidence of breakdown, no evidence of rash   Neuro:  pt is fairly alert, oriented. Fair insight and awareness. Speech still dysarthric. Normal language. CN notable for left CN VII, left inattetion. Shoulders both limited by ortho. RUE otherwise is 4 to 4+/5 and LUE is 3+ to 4/5. RLE is 3/5. LLE is 3 to 3+/5 prox to distal. .Musculoskeletal: knees non tender without effusion . No pain with ROM Left ankle, non tender no evidence of effusion,  kyphotic posture   Assessment/Plan: 1. Functional deficits which require 3+ hours per day of interdisciplinary therapy in a comprehensive inpatient rehab setting. Physiatrist is providing close team supervision and 24 hour management of active medical  problems listed below. Physiatrist and rehab team continue to assess barriers to discharge/monitor patient progress toward functional and medical goals  Care Tool:  Bathing    Body parts bathed by patient: Right arm, Left arm, Face, Front perineal area, Chest, Abdomen   Body parts bathed by helper: Right upper leg, Left upper leg, Buttocks, Left lower leg, Right lower leg     Bathing assist Assist Level: Maximal Assistance - Patient 24 - 49%     Upper Body Dressing/Undressing Upper body dressing   What is the patient wearing?: Pull over shirt    Upper body assist Assist Level: Maximal Assistance - Patient 25 - 49%    Lower Body Dressing/Undressing Lower body dressing      What is the patient wearing?: Pants     Lower body assist Assist for lower body dressing: Maximal Assistance - Patient 25 - 49%     Toileting Toileting    Toileting assist Assist for toileting: Maximal Assistance - Patient 25 - 49% (simulated)     Transfers Chair/bed transfer  Transfers assist     Chair/bed transfer assist level: Maximal Assistance - Patient 25 -  49%     Locomotion Ambulation   Ambulation assist   Ambulation activity did not occur: Safety/medical concerns (limited 2/2 high ankle pain)          Walk 10 feet activity   Assist  Walk 10 feet activity did not occur: Safety/medical concerns (limited 2/2 high ankle pain)        Walk 50 feet activity   Assist Walk 50 feet with 2 turns activity did not occur: Safety/medical concerns (limited 2/2 high ankle pain)         Walk 150 feet activity   Assist Walk 150 feet activity did not occur: Safety/medical concerns (limited 2/2 high ankle pain)         Walk 10 feet on uneven surface  activity   Assist Walk 10 feet on uneven surfaces activity did not occur: Safety/medical concerns (limited 2/2 high ankle pain)         Wheelchair     Assist Is the patient using a wheelchair?: Yes Type of  Wheelchair: Manual    Wheelchair assist level: Dependent - Patient 0% Brenn wheelchair distance: 34ft    Wheelchair 50 feet with 2 turns activity    Assist        Assist Level: Dependent - Patient 0%   Wheelchair 150 feet activity     Assist      Assist Level: Dependent - Patient 0%   Blood pressure 138/61, pulse 87, temperature (!) 97.5 F (36.4 C), resp. rate 16, height 5\' 11"  (1.803 m), weight 80.7 kg, SpO2 96%.  Medical Problem List and Plan: 1. 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  Antithrombotics: -DVT/anticoagulation:  Pharmaceutical: Eliquis             -antiplatelet therapy: N/A 3. Pain Management:             No consistent pain c/os             - xray of left ankle with arthritic changes tib-talar joint and soft tissue swelling of heel and plantar foot             -continue voltaren gel to right chest wall and ankle TID.   -needs appropriate shoewear when up with therapy Ordered  CAM walker boot but ambulated without it in therapy no c/o yesterday   Bilateral knee pain OA add voltaren gel  4. Mood/Behavior/Sleep: Remeron 15 mg nightly.  Xanax 0.25 mg nightly as needed             -antipsychotic agents: N/A 5. Neuropsych/cognition: This patient is capable of making decisions on his own behalf. 6. Skin/Wound Care: Routine skin checks 7. Fluids/Electrolytes/Nutrition: Routine and analysis follow-up chemistries 8.  Newly diagnosed A-fib with RVR.  Continue Eliquis as well as Coreg 12.5 mg twice daily.  Plan outpatient follow-up consider DCCV  -remains in a fib, rate controlled 9.  CAD/CABG.  Imdur 15 mg daily. 10.  Hypertension.  Monitor with increased mobility.  Hydralazine and  Norvasc currently on hold due to soft blood pressure and resume as needed   Vitals:   03/23/23 1812  03/24/23 0457  BP: 130/70 138/61  Pulse: 76 87  Resp: 16 16  Temp: 98.7 F (37.1 C) (!) 97.5 F (36.4 C)  SpO2: 98% 96%  Increased coreg to 25mg  BID , improved   11.  History of prostate cancer with recent E. coli UTI/prostatitis.  Complete course of Augmentin 12.  CKD stage IV.  Baseline creatinine 2.0-2.2.  Follow-up chemistries on Monday    Latest Ref Rng & Units 03/22/2023    5:22 AM 03/21/2023   12:49 PM 03/19/2023    6:31 AM  BMP  Glucose 70 - 99 mg/dL 95  098  92   BUN 8 - 23 mg/dL 29  31  24    Creatinine 0.61 - 1.24 mg/dL 1.19  1.47  8.29   Sodium 135 - 145 mmol/L 136  132  135   Potassium 3.5 - 5.1 mmol/L 4.1  3.9  3.8   Chloride 98 - 111 mmol/L 100  103  105   CO2 22 - 32 mmol/L 22  19  20    Calcium 8.9 - 10.3 mg/dL 9.2  8.5  8.4    Creat at baseline, fluid intake poor BUN elevated over baseline  13.  GERD/abdominal pain/nausea and vomiting.  Seen by GI recommended empiric PPI daily, Carafate. I see no recs for flagyl---stopped 2/8  - No plans for endoscopic intervention at this time             -no recorded emesis since I saw him in consult earlier this week 14.  Hyperlipidemia.  Lipitor 15.  History of mesenteric artery stenosis.  Follow-up outpatient 16.  Anemia of chronic disease.  Continue iron supplement.  Occult blood negative.   Follow-up CBC stable      Latest Ref Rng & Units 03/22/2023    5:22 AM 03/21/2023   12:49 PM 03/19/2023    6:31 AM  CBC  WBC 4.0 - 10.5 K/uL 7.5  9.3  11.5   Hemoglobin 13.0 - 17.0 g/dL 8.9  8.7  9.5   Hematocrit 39.0 - 52.0 % 27.3  26.1  28.7   Platelets 150 - 400 K/uL 321  304  293      LOS: 5 days A FACE TO FACE EVALUATION WAS PERFORMED  Erick Colace 03/24/2023, 8:39 AM

## 2023-03-24 NOTE — Patient Care Conference (Signed)
Inpatient RehabilitationTeam Conference and Plan of Care Update Date: 03/24/2023   Time: 10:40 AM    Patient Name: Brandon Harris      Medical Record Number: [id number]  Date of Birth: [birth year] Sex: Male         Room/Bed: 4M09C/4M09C-01 Payor Info: Payor: Advertising copywriter MEDICARE / Plan: [facility] MEDICARE / Product Type: *No Product type* /    Admit Date/Time:  03/19/2023  3:35 PM  Primary Diagnosis:  Right middle cerebral artery stroke [facility])  Hospital Problems: Principal Problem:   Right middle cerebral artery stroke [facility])    Expected Discharge Date: Expected Discharge Date: 04/01/23  Team Members Present: Physician leading conference: Dr. Claudette Laws Social Worker Present: Dossie Der, LCSW Nurse Present: Chana Bode, RN PT Present: Ralph Leyden, PT OT Present: Mariann Barter, OT SLP Present: Everardo Pacific, SLP PPS Coordinator present : Fae Pippin, SLP     Current Status/Progress Goal Weekly Team Focus  Bowel/Bladder   Pt is continent, but have incontinent episodes. LBM: 2/11   Encourage timed toileting, so pt can be continent 100% of the time.   Assist w/ toileting needs q 2-4 hours and as needed.    Swallow/Nutrition/ Hydration               ADL's   Very motivated and progressing quickly towards reaching his goals following recieving IV fluids over the weekend, min A U/LB dressing, min UB bathing, mod LB bathing, mod toileting, stand pivot transfers using RW skilled min A // Barriers- activity tolerance, kyphotic trunk positioning, chronic neck/back pain   superivsion-CGA   activity tolerance, functional mobility training, functional transfer training, generalized strengthening, BADL retraining    Mobility   Bed mobility = can be CGA/ MinA or MaxA, Transfers = MinA to MaxA, Ambulation = once reaching 45 ft using RW with CGA/ MinA. No noted hemi weakness but pt relates mild strength difference   CGA overall  Barriers: variable alertness/ fatigue levels,  low activity tolerance, pain in shoulders/ ankle/ neck /// Work on: generalized strengthening, increasing activity tolerance, ambulation, maintaining low LOA in transfers, family education    Building services engineer Observations  likely close to baseline with deficts in recall, attention, mental manipulation, and calculations   supervision   attention, recall, problem solving,    Pain   Pt c/o bilat. leg/knee pain rating it a 8/10. Pain medication and ointment given.   Keep pain < 4/10   Assess pain q shift and PRN.    Skin   MASD to groin area - barrier cream and powder   Maintain skin integrity.  Assess skin q shift & PRN.      Discharge Planning:  Home with daughter with whom he lives with-she is here daily and is aware may require physical care   Team Discussion: Patient post right MCA CVA with chronic neck and back pain, posterior lean/kyphotic stance. Limited by poor activity tolerance with fatigue.  Patient on target to meet rehab goals: yes, currently needs min assist for ADLs and mod assist for toileting. Needs min assist for stand pivot to the toilet using a RW. Able to ambulate up to 100' using a RW with CGA - min assist and corrects posterior leaning with verbal cues.  Goals for discharge set for supervision to CGA overall.  *See Care Plan and progress notes for long and short-term goals.   Revisions to Treatment Plan:  N/a   Teaching Needs: Safety, medications, diet modification, transfers, toileting, etc.  Current Barriers to Discharge: Decreased caregiver support  Possible Resolutions to Barriers: Family education HH follow up services DME: TTB     Medical Summary Current Status: migrating pains, improved after IVF over the weekend , L ankle xrays with mod OA, CHronic knee pain with hx of R TKR  Barriers to Discharge: Medical stability;Incontinence   Possible Resolutions to [employer]: poor activity  tolerance change to 15/7 schedule   Continued Need for Acute Rehabilitation Level of Care: The patient requires daily medical management by a physician with specialized training in physical medicine and rehabilitation for the following reasons: Direction of a multidisciplinary physical rehabilitation program to maximize functional independence : Yes Medical management of patient stability for increased activity during participation in an intensive rehabilitation regime.: Yes Analysis of laboratory values and/or radiology reports with any subsequent need for medication adjustment and/or medical intervention. : Yes   I attest that I was present, lead the team conference, and concur with the assessment and plan of the team.   Chana Bode B 03/24/2023, 1:37 PM

## 2023-03-24 NOTE — Progress Notes (Addendum)
Physical Therapy Note  Patient Details  Name: DYSHAUN BONZO MRN: [id number] Date of Birth: 11/07/1928 Today's Date: 03/24/2023    Therapy team has recommended 15/7 schedule change d/t recent limited participation 2/2 to variable/ extreme fatigue. Will revisit change back to normal schedule (5 out of 7) with improvement in alertness and participation.     Loel Dubonnet PT, DPT, CSRS 03/24/2023, 4:43 PM

## 2023-03-24 NOTE — Progress Notes (Signed)
Occupational Therapy Session Note  Patient Details  Name: Brandon Harris MRN: [id number] Date of Birth: March 31, 1928  Today's Date: 03/24/2023 OT Individual Time: 4098-1191 OT Individual Time Calculation (min): 70 min  OT Individual Time: 4782-9562 OT Individual Time Calculation (min): 32 min   Short Term Goals: Week 1:  OT Short Term Goal 1 (Week 1): Pt will complete sit<>stands during BADLs with mod A x1 consistly using LRAD OT Short Term Goal 2 (Week 1): Pt will complete toilet transfers mod A using LRAD OT Short Term Goal 3 (Week 1): Pt will tolerate standing >1 minute during functional tasks with min A using LRAD OT Short Term Goal 4 (Week 1): Pt will complete grooming/hygiene tasks with min A  Skilled Therapeutic Interventions/Progress Updates:     Session 1:  Pt received semi-reclined in bed presenting to be in good spirits receptive to skilled OT session reporting 2/10 pain in neck and back- OT offering intermittent rest breaks, repositioning, and therapeutic support to optimize participation in therapy session. Pt dressed and ready for the day upon OT arrival with Pt politely declining need for shower this session. Focus this session activity tolerance, dynamic standing balance, functional cognition, and RW management to increase overall safety and independence in BADLs.   Mobility:  -Bed mobility: Pt transitioned to EOB using bed rail with supervision +increased time -Stand pivot and ambulatory transfers: Pt required CGA-min A for stand pivot transfers using RW and min verbal cues for technique  Therapeutic Activity:  -Engaged Pt in completing obstacle course activity navigating between cones using RW with focus on RW management, trunk/head alignment, functional cognition, and safety awareness. Pt able to complete 2 trials of ~20 ft using RW with min A required overall and consistent verbal and tactile cues required for R foot step height and head/trunk alignment to decrease  kyphotic posturing. Pt with single instance of LOB when turning around d/t over stepping with L foot requiring mod A to correct. Pt presenting with appropriate safety awareness and problem solving skills during activity, decreased body awareness noted.  -Pt completed pipe tree picture replication task in standing position using RW to work on standing balance, standing tolerance, and functional cognition skills. Pt able to tolerate standing ~4 minutes x4 trials with seated rest breaks provided between trials. Pt required mod questioning cues during activity for problem solving. Pt able to utilize B UEs to assemble pipe structure with min dropping and min modifications required d/t decreased functional use of his shoulders.   Therapeutic Exercises:  Engaged Pt in completing the following exercises and stretches to improve trunk alignment/activation and increase B UE strength for increased safety and independence in BADLs.  -Lateral leans to elbow R/L 1x10 reps -Partial seated sit-up 1x10 reps -Chest openers cat/cow stretches 1x10 reps -Chest open rotation active stretch 1x10 reps -Sit <> stands with controlled decent 1x10 reps  Pt was left resting in wc with call bell in reach, seatbelt alarm on, and all needs met.    Session 2:  Pt received sitting up in wc presenting to be in good spirits receptive to skilled OT session reporting 4/10 pain- OT offering intermittent rest breaks, repositioning, and therapeutic support to optimize participation in therapy session. Focus this session activity tolerance. Transported Pt <> therapy spaces in wc for energy conservation and time management.   Therapeutic Exercise:  Engaged Pt in B U/LE reciprocal movement training and endurance training on NuStep to increase activity tolerance for BADLs. Pt completed 2 intervals of 5 minutes  on level 5 setting on NuStep with 5 minute rest break provided between trials. Following activity, engaged Pt in PLB'ing with education  and demonstration provided on technique to decrease respiratory rate with noted improvement.   Mobility/BADLs:  -Pt requesting to use restroom at end of session. Pt completed functional mobility to bathroom using RW with CGA. Pt completed 3/3 toileting tasks CGA using RW with min A provided to fully bring pants to waist. Pt completed ambulatory transfer to EOB using RW with CGA and min verbal cues for RW management. EOB > supine supervision using bed rail.   Pt was left resting in bed with call bell in reach, bed alarm on, and all needs met.    Therapy Documentation Precautions:  Precautions Precautions: Fall Restrictions Weight Bearing Restrictions Per Provider Order: No  Therapy/Group: Individual Therapy  Clide Deutscher 03/24/2023, 1:32 PM

## 2023-03-24 NOTE — Progress Notes (Signed)
Physical Therapy Session Note  Patient Details  Name: Brandon Harris MRN: [id number] Date of Birth: Oct 23, 1928  Today's Date: 03/24/2023 PT Individual Time: 0805-0900 PT Individual Time Calculation (min): 55 min   Short Term Goals: Week 1:  PT Short Term Goal 1 (Week 1): Pt will transfer supine > sit  using minA PT Short Term Goal 2 (Week 1): Pt will complete sit to stand using minA + LRAD PT Short Term Goal 3 (Week 1): Pt will maintain dynamic standing balance using CGA PT Short Term Goal 4 (Week 1): Pt will complete amb of 67ft using MinA + LRAD PT Short Term Goal 5 (Week 1): Pt will initiate stair training  Skilled Therapeutic Interventions/Progress Updates:  Patient supine in bed on entrance to room. Patient awake, alert and agreeable to PT session.   Patient with mild pain complaint in B knees at start of session. LPN enters room and is applies voltaren gel to pt's knees. Relates improvement in neck pain from previous active session.   Therapeutic Activity: Bed Mobility: Pt performed supine > sit with CGA/ MinA with provision of HHA to his R hand to pivot hips around. VC/ tc required for effort, forward scoot. Seated position on EOB with no LOB and min UE support.  Transfers: Pt performed sit<>stand and stand pivot transfers throughout session with Min/ ModA for initial rise to stand from EOB d/t posterior lean/ bias. Pt requires multiple tries and allowed to fail initially and then added Min/ ModA to complete with vc. Throughout rest of session, pt improves with more posterior foot placement and increased forward lean to MinA and then CGA/ MinA. Provided vc/ tc throughout for sequencing and technique to improve forward weight shift closer to bringing weight over feet.   Car transfer performed with CGA/ MinA for power up from w/c but then is able to perform stand pivot to car with use of car handles and completes with CGA. Rise to stand from car with light MinA and push from seat to RW.  Ambulatory transfer to mat table with CGA/ MinA.   Gait Training:  Pt ambulated 124' x1/ 26' x1/ 110' x1 using RW with CGA/ MinA with pt tending to push RW ahead and flexing trunk forward. Pt then guided in ambulation 39' x1 with HHA to RUE, however pt then demonstrates decrease in quality of gait with lower step height, shorter step length, and decreased weight shift to L. Demonstrated decreased pressure into therapist's hand. VC throughout for increased L step height/ length, improving upright posture with tc, and maintaining proximity to RW.   On return to room ditr relates that pt does not normally have flexed posture as he does currently. Also did not use AD.    Patient supine in bed at end of session with brakes locked, bed alarm set, and all needs within reach. Dtr in room.   Therapy Documentation Precautions:  Precautions Precautions: Fall Restrictions Weight Bearing Restrictions Per Provider Order: No  Pain:  No specific pain complaint this session.   Therapy/Group: Individual Therapy  Loel Dubonnet PT, DPT, CSRS 03/24/2023, 4:52 PM

## 2023-03-24 NOTE — Progress Notes (Signed)
Speech Language Pathology Daily Session Note  Patient Details  Name: Brandon Harris MRN: [id number] Date of Birth: January 19, 1929  Today's Date: 03/24/2023 SLP Individual Time: 9562-1308 SLP Individual Time Calculation (min): 47 min  Short Term Goals: Week 1: SLP Short Term Goal 1 (Week 1): Pt will recall novel, functional information with 75% accuracy provided min-mod A cues SLP Short Term Goal 2 (Week 1): Pt will solve mildly complex problems with 75% accuracy provided min A cues SLP Short Term Goal 3 (Week 1): Pt will participate in alternating attention tasks during functional activities with 70% accuracy provided min-mod A cues  Skilled Therapeutic Interventions:  Patient was seen in am to address cognitive re- training. Pt was alert upon SLP arrival in bed. Pt's dtr present and an active participant throughout session. Pt expressed need for toileting with assigned nurse present assisting with transfers. Pt followed simple one and two step directions appropriately. Pt was continent of bladder and incontinent of bowel requiring assist with peri-care. Pt agreeable to continue session remaining upright in WC. SLP engaged pt in reminiscence therapy for rapport building. Pt discussed managing a farm and livestock. 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 He expressed desire to return to routine post recent CVA. SLP subsequently engaged pt in problem solving. Pt with impaired awareness of current limitations as indicated by stating he could return to current routine safely at this time. SLP challenged pt in identifying safe vs unsafe situations given scenarios presented verbally. Pt appropriately identified safe vs unsafe situations with 67% acc with min A. When asked he identified call button and 2 examples of utilization with sup A. At conclusion of session, pt was left upright in [facility] with call button within reach and dtr present. SLP to  continue POC.   Pain Pain Assessment Pain Scale: 0-10 Pain Score: 0-No pain  Therapy/Group: Individual Therapy  Renaee Munda 03/24/2023, 11:27 AM

## 2023-03-24 NOTE — Progress Notes (Signed)
Physical Therapy Session Note  Patient Details  Name: Brandon Harris MRN: [id number] Date of Birth: 12-Oct-1928  Today's Date: 03/23/2023 PT Individual Time:  1445-1510     and Today's Date: 03/23/2023 PT Missed Time: 35 min Missed Time Reason: Patient fatigue   Short Term Goals: Week 1:  PT Short Term Goal 1 (Week 1): Pt will transfer supine > sit  using minA PT Short Term Goal 2 (Week 1): Pt will complete sit to stand using minA + LRAD PT Short Term Goal 3 (Week 1): Pt will maintain dynamic standing balance using CGA PT Short Term Goal 4 (Week 1): Pt will complete amb of 3ft using MinA + LRAD PT Short Term Goal 5 (Week 1): Pt will initiate stair training  Skilled Therapeutic Interventions/Progress Updates:  Patient asleep on entrance to room. Unable to rouse pt successfully. Dtr in room and relates that pt has been fatigued and sleeping all day which is unusual for him. Pt normally is active throughout day with land, property, and working on equipment.   Patient with no appearance of pain at start of session.  Therapeutic Activity: Pt's dtr with questions re: physiology of stroke and concerned re: behavior of sleeping throughout day as he is normally very active. Discussed with dtr nature of stroke, nature of body healing and healing of nerve tissue, nature of dehydration on body physiology, healing and electrolyte balance. Related that it is fairly normal to have variable levels of activity/ alertness throughout healing process. Okay to allow body to rest for increased healing.   Educated dtr to continue to encourage fluid/ food intake when pt awake.   Will discuss pt's CLOF and expected progress in team conference tomorrow.   Pt missed of skilled therapy due to fatigue. Will re-attempt as schedule and pt availability permits.  Patient supine and asleep at end of session with brakes locked, bed alarm set, and all needs within reach. Dtr in room.    Therapy  Documentation Precautions:  Precautions Precautions: Fall Restrictions Weight Bearing Restrictions Per Provider Order: No  Pain:  No pain noted as pt sleeping peacefully.  Therapy/Group: Individual Therapy  Loel Dubonnet PT, DPT, CSRS 03/23/2023, 5:05 PM

## 2023-03-24 NOTE — Progress Notes (Signed)
[name], male   DOB: 18-Mar-1928, 89 y.o.   MRN: [id number]  Met with pt and daughter who was present in his room to discuss team conference goals of supervision-CGA level and target discharge date of 2/20. Aware of his balance issues and need to be held onto when ambulating. Pt has all needed equipment from previous admissions. Agreeable to home health and has no preference has had numerous ones.

## 2023-03-25 DIAGNOSIS — M19072 Primary osteoarthritis, left ankle and foot: Secondary | ICD-10-CM | POA: Diagnosis not present

## 2023-03-25 DIAGNOSIS — I63511 Cerebral infarction due to unspecified occlusion or stenosis of right middle cerebral artery: Secondary | ICD-10-CM | POA: Diagnosis not present

## 2023-03-25 DIAGNOSIS — I4891 Unspecified atrial fibrillation: Secondary | ICD-10-CM | POA: Diagnosis not present

## 2023-03-25 DIAGNOSIS — I10 Essential (primary) hypertension: Secondary | ICD-10-CM | POA: Diagnosis not present

## 2023-03-25 NOTE — Progress Notes (Signed)
Speech Language Pathology Daily Session Note  Patient Details  Name: Brandon Harris MRN: [id number] Date of Birth: [birth year]  Today's Date: 03/25/2023 SLP Individual Time: 4098-1191 SLP Individual Time Calculation (min): 24 min  Short Term Goals: Week 1: SLP Short Term Goal 1 (Week 1): Pt will recall novel, functional information with 75% accuracy provided min-mod A cues SLP Short Term Goal 2 (Week 1): Pt will solve mildly complex problems with 75% accuracy provided min A cues SLP Short Term Goal 3 (Week 1): Pt will participate in alternating attention tasks during functional activities with 70% accuracy provided min-mod A cues  Skilled Therapeutic Interventions: SLP conducted skilled therapy session targeting cognitive retraining goals. SLP facilitated mildly complex money counting task. Patient benefited from min verbal cues throughout to accurately identify amounts of money presented on each card. Patient was left in lowered bed with call bell in reach and bed alarm set. SLP will continue to target goals per plan of care.       Pain Pain Assessment Pain Scale: 0-10 Pain Score: 9  Pain Location: Head Offered to alert RN re: pain medications, however patient declined.  Therapy/Group: Individual Therapy  Jeannie Done, M.A., CCC-SLP  Yetta Barre 03/25/2023, 3:44 PM

## 2023-03-25 NOTE — Progress Notes (Signed)
Speech Language Pathology Daily Session Note  Patient Details  Name: Brandon Harris MRN: [id number] Date of Birth: March 13, 1928  Today's Date: 03/25/2023 SLP Individual Time: 1116-1200 SLP Individual Time Calculation (min): 44 min  Short Term Goals: Week 1: SLP Short Term Goal 1 (Week 1): Pt will recall novel, functional information with 75% accuracy provided min-mod A cues SLP Short Term Goal 2 (Week 1): Pt will solve mildly complex problems with 75% accuracy provided min A cues SLP Short Term Goal 3 (Week 1): Pt will participate in alternating attention tasks during functional activities with 70% accuracy provided min-mod A cues  Skilled Therapeutic Interventions: Skilled therapy session focused on cognitive goals. Upon entrance, SLP introduced self and role. SLP faciliated session by providing minA during identification of problem/solution in picture cards. Patients daughter reports  she manages patients medications and finances, however he provided oil changes and serviced vechicles PTA. Noted reduced speech intelligibility, though patients daughter reports vocal quality is baseline. Patient may benefit from education regarding speech intelligibility strategies during conversation. Patient left in chair with alarm set and call bell in reach. Continue POC  Pain None reported  Therapy/Group: Individual Therapy  Callie Facey M.A., CF-SLP 03/25/2023, 7:44 AM

## 2023-03-25 NOTE — Progress Notes (Signed)
Physical Therapy Session Note  Patient Details  Name: Brandon Harris MRN: [id number] Date of Birth: [birth year]  Today's Date: 03/25/2023 PT Individual Time: 1002-1115 PT Individual Time Calculation (min): 73 min   Short Term Goals: Week 1:  PT Short Term Goal 1 (Week 1): Pt will transfer supine > sit  using minA PT Short Term Goal 2 (Week 1): Pt will complete sit to stand using minA + LRAD PT Short Term Goal 3 (Week 1): Pt will maintain dynamic standing balance using CGA PT Short Term Goal 4 (Week 1): Pt will complete amb of 18ft using MinA + LRAD PT Short Term Goal 5 (Week 1): Pt will initiate stair training  Skilled Therapeutic Interventions/Progress Updates: Pt presents supine in bed and agreeable to therapy.  Pt transfers sup to sit w/ CGA w/ bed features.  Pt scooted to EOB and required mod A for donning shoes w/ pt using shoehorn and then ties own shoes.  Pt transfers sit to stand from slightly elevated bed height to simulate home per daughter.  Pt stepped to w/c w/ RW and min A.  Pt wheeled to main gym for energy conservation.  Pt performed standing marching w/ mirror for improved posture although gradually returns to stooped posture.  Pt performed standing and reaching for squigs on mirror, although unable to reach greater than shoulder height.  Pt requires seated rest breaks.  Pt amb multiple tirals w/ RW of 47', 60', and then 13' although noted fatigue as progresses.  Pt returned to room and remained sitting in w/c w/ chair alarm on.  Daughter present in room and will help w/ any requests.     Therapy Documentation Precautions:  Precautions Precautions: Fall Restrictions Weight Bearing Restrictions Per Provider Order: No General:   Vital Signs:   Pain:0/10     Therapy/Group: Individual Therapy  Lucio Edward 03/25/2023, 12:23 PM

## 2023-03-25 NOTE — Progress Notes (Addendum)
PROGRESS NOTE   Subjective/Complaints: "Am I in A fib?" No pain c/os this am , slept better  ROS: Patient denies CP, SOB, N/V/D   Objective:   No results found.  No results for input(s): "WBC", "HGB", "HCT", "PLT" in the last 72 hours.  No results for input(s): "NA", "K", "CL", "CO2", "GLUCOSE", "BUN", "CREATININE", "CALCIUM" in the last 72 hours.   Intake/Output Summary (Last 24 hours) at 03/25/2023 0750 Last data filed at 03/24/2023 1900 Gross per 24 hour  Intake 120 ml  Output --  Net 120 ml        Physical Exam: Vital Signs Blood pressure 137/71, pulse 76, temperature (!) 97.5 F (36.4 C), resp. rate 17, height 5\' 11"  (1.803 m), weight 80.7 kg, SpO2 97%.   General: No acute distress Mood and affect are appropriate Heart: IRRR  no rubs murmurs or extra sounds Lungs: Clear to auscultation, breathing unlabored, no rales or wheezes Abdomen: Positive bowel sounds, soft nontender to palpation, nondistended Extremities: No clubbing, cyanosis, or edema Skin: No evidence of breakdown, no evidence of rash   Neuro:  pt is fairly alert, oriented. Fair insight and awareness. Speech still dysarthric. Normal language. CN notable for left CN VII, left inattetion. Shoulders both limited by ortho. RUE otherwise is 4 /5 and LUE is  4/5. RLE is 4-/5. LLE is 4-5 prox to distal. .Musculoskeletal: knees non tender without effusion . No pain with ROM Left ankle, non tender no evidence of effusion,  kyphotic posture   Assessment/Plan: 1. Functional deficits which require 3+ hours per day of interdisciplinary therapy in a comprehensive inpatient rehab setting. Physiatrist is providing close team supervision and 24 hour management of active medical problems listed below. Physiatrist and rehab team continue to assess barriers to discharge/monitor patient progress toward functional and medical goals  Care Tool:  Bathing    Body parts  bathed by patient: Right arm, Left arm, Face, Front perineal area, Chest, Abdomen   Body parts bathed by helper: Right upper leg, Left upper leg, Buttocks, Left lower leg, Right lower leg     Bathing assist Assist Level: Maximal Assistance - Patient 24 - 49%     Upper Body Dressing/Undressing Upper body dressing   What is the patient wearing?: Pull over shirt    Upper body assist Assist Level: Maximal Assistance - Patient 25 - 49%    Lower Body Dressing/Undressing Lower body dressing      What is the patient wearing?: Pants     Lower body assist Assist for lower body dressing: Maximal Assistance - Patient 25 - 49%     Toileting Toileting    Toileting assist Assist for toileting: Maximal Assistance - Patient 25 - 49% (simulated)     Transfers Chair/bed transfer  Transfers assist     Chair/bed transfer assist level: Maximal Assistance - Patient 25 - 49%     Locomotion Ambulation   Ambulation assist   Ambulation activity did not occur: Safety/medical concerns (limited 2/2 high ankle pain)          Walk 10 feet activity   Assist  Walk 10 feet activity did not occur: Safety/medical concerns (limited 2/2 high ankle  pain)        Walk 50 feet activity   Assist Walk 50 feet with 2 turns activity did not occur: Safety/medical concerns (limited 2/2 high ankle pain)         Walk 150 feet activity   Assist Walk 150 feet activity did not occur: Safety/medical concerns (limited 2/2 high ankle pain)         Walk 10 feet on uneven surface  activity   Assist Walk 10 feet on uneven surfaces activity did not occur: Safety/medical concerns (limited 2/2 high ankle pain)         Wheelchair     Assist Is the patient using a wheelchair?: Yes Type of Wheelchair: Manual    Wheelchair assist level: Dependent - Patient 0% Ianmichael wheelchair distance: 365ft    Wheelchair 50 feet with 2 turns activity    Assist        Assist Level: Dependent  - Patient 0%   Wheelchair 150 feet activity     Assist      Assist Level: Dependent - Patient 0%   Blood pressure 137/71, pulse 76, temperature (!) 97.5 F (36.4 C), resp. rate 17, height 5\' 11"  (1.803 m), weight 80.7 kg, SpO2 97%.  Medical Problem List and Plan: 1. 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  Antithrombotics: -DVT/anticoagulation:  Pharmaceutical: Eliquis             -antiplatelet therapy: N/A 3. Pain Management:             No consistent pain c/os             - xray of left ankle with arthritic changes tib-talar joint and soft tissue swelling of heel and plantar foot             -continue voltaren gel to right chest wall and ankle TID.   -needs appropriate shoewear when up with therapy Ordered  CAM walker boot but ambulated without it in therapy no c/o yesterday   Bilateral knee pain OA add voltaren gel  4. Mood/Behavior/Sleep: Remeron 15 mg nightly.  Xanax 0.25 mg nightly as needed             -antipsychotic agents: N/A 5. Neuropsych/cognition: This patient is capable of making decisions on his own behalf. 6. Skin/Wound Care: Routine skin checks 7. Fluids/Electrolytes/Nutrition: Routine and analysis follow-up chemistries 8.  Newly diagnosed A-fib with RVR.  Continue Eliquis as well as Coreg 12.5 mg twice daily.  Plan outpatient follow-up consider DCCV  -remains in a fib, rate controlled 9.  CAD/CABG.  Imdur 15 mg daily. 10.  Hypertension.  Monitor with increased mobility.  Hydralazine and Norvasc currently on hold due to soft blood pressure and resume as needed   Vitals:   03/24/23 1930 03/25/23 0549  BP: (!) 151/73 137/71  Pulse: 79 76  Resp: 17 17  Temp: 97.7 F (36.5 C) (!) 97.5 F (36.4 C)  SpO2:  99% 97%  Increased coreg to 25mg  BID , improved   11.  History of prostate cancer with recent E. coli UTI/prostatitis.  Complete course of Augmentin 12.  CKD  stage IV.  Baseline creatinine 2.0-2.2.  Follow-up chemistries on Monday    Latest Ref Rng & Units 03/22/2023    5:22 AM 03/21/2023   12:49 PM 03/19/2023    6:31 AM  BMP  Glucose 70 - 99 mg/dL 95  161  92   BUN 8 - 23 mg/dL 29  31  24    Creatinine 0.61 - 1.24 mg/dL 0.96  0.45  4.09   Sodium 135 - 145 mmol/L 136  132  135   Potassium 3.5 - 5.1 mmol/L 4.1  3.9  3.8   Chloride 98 - 111 mmol/L 100  103  105   CO2 22 - 32 mmol/L 22  19  20    Calcium 8.9 - 10.3 mg/dL 9.2  8.5  8.4    Creat at baseline, fluid intake poor BUN elevated over baseline  13.  GERD/abdominal pain/nausea and vomiting.  Seen by GI recommended empiric PPI daily, Carafate. - No plans for endoscopic intervention at this time             -no recorded emesis since I saw him in consult earlier this week 14.  Hyperlipidemia.  Lipitor 15.  History of mesenteric artery stenosis.  Follow-up outpatient 16.  Anemia of chronic disease.  Continue iron supplement.  Occult blood negative.   Follow-up CBC stable      Latest Ref Rng & Units 03/22/2023    5:22 AM 03/21/2023   12:49 PM 03/19/2023    6:31 AM  CBC  WBC 4.0 - 10.5 K/uL 7.5  9.3  11.5   Hemoglobin 13.0 - 17.0 g/dL 8.9  8.7  9.5   Hematocrit 39.0 - 52.0 % 27.3  26.1  28.7   Platelets 150 - 400 K/uL 321  304  293      LOS: 6 days A FACE TO FACE EVALUATION WAS PERFORMED  Erick Colace 03/25/2023, 7:50 AM

## 2023-03-25 NOTE — Progress Notes (Signed)
Occupational Therapy Session Note  Patient Details  Name: Brandon Harris MRN: [id number] Date of Birth: 01/06/1929  Today's Date: 03/25/2023 OT Individual Time: 1403-1430 OT Individual Time Calculation (min): 27 min    Short Term Goals: Week 1:  OT Short Term Goal 1 (Week 1): Pt will complete sit<>stands during BADLs with mod A x1 consistly using LRAD OT Short Term Goal 2 (Week 1): Pt will complete toilet transfers mod A using LRAD OT Short Term Goal 3 (Week 1): Pt will tolerate standing >1 minute during functional tasks with min A using LRAD OT Short Term Goal 4 (Week 1): Pt will complete grooming/hygiene tasks with min A  Skilled Therapeutic Interventions/Progress Updates:     Pt received reclined in bed presenting to be in good spirits receptive to skilled OT session reporting 0/10 pain- OT offering intermittent rest breaks, repositioning, and therapeutic support to optimize participation in therapy session. Focus this session functional mobility training to increase overall activity tolerance for BADLs and re-education on sit <> stand technique. Pt transitioned to EOB using bed rail with supervision +increased time. Pt dressed and ready for the day; donned shoes EOB with min A using long handled shoe horn. From EOB, engaged Pt in blocked practice of sit <> stands to increase LB strength and support learning of body mechanics for sit <> stand technique. Pt completed total of 8 sit <> stands with mod verbal cues and min A required initially with multiple attempts required to fully rise into standing position, fading to CGA-close supervision for sit <> stands following practice with carryover of correct hand placement. Pt completed 2 trials of 100 ft of functional mobility using RW with focus on step height and trunk positioning as Pt tends to shuffle his feet and present with a forward flexed trunk positioning in standing. Consistent verbal and tactile cues required to facilitate upright postural  alignment and in crease step height/length with seated rest breaks provided between and following trials. Pt requesting to return to bed at end of session. EOB > supine supervision using bed rail. Pt was left resting in bed with call bell in reach, bed alarm on, and all needs met.    Therapy Documentation Precautions:  Precautions Precautions: Fall Restrictions Weight Bearing Restrictions Per Provider Order: No   Therapy/Group: Individual Therapy  Clide Deutscher 03/25/2023, 3:46 PM

## 2023-03-26 DIAGNOSIS — I4891 Unspecified atrial fibrillation: Secondary | ICD-10-CM | POA: Diagnosis not present

## 2023-03-26 DIAGNOSIS — I10 Essential (primary) hypertension: Secondary | ICD-10-CM | POA: Diagnosis not present

## 2023-03-26 DIAGNOSIS — M19072 Primary osteoarthritis, left ankle and foot: Secondary | ICD-10-CM | POA: Diagnosis not present

## 2023-03-26 DIAGNOSIS — I63511 Cerebral infarction due to unspecified occlusion or stenosis of right middle cerebral artery: Secondary | ICD-10-CM | POA: Diagnosis not present

## 2023-03-26 MED ORDER — SODIUM CHLORIDE 0.45 % IV SOLN: INTRAVENOUS | Status: DC

## 2023-03-26 NOTE — Progress Notes (Signed)
Occupational Therapy Note  Patient Details  Name: Brandon Harris MRN: [id number] Date of Birth: 11-01-1928  Today's Date: 03/26/2023 OT Missed Time: 30 Minutes Missed Time Reason: Pain;Patient fatigue  Pt received supine in bed with daughter present. Pt reports "I can't do anything today" with pt reporting pain all over and fatigue. Pt missed 30 mins of OT session.    Pollyann Glen [facility] 03/26/2023, 11:18 AM

## 2023-03-26 NOTE — Progress Notes (Signed)
PROGRESS NOTE   Subjective/Complaints:  No issues overnite , no knee pain  ROS: Patient denies CP, SOB, N/V/D   Objective:   No results found.  No results for input(s): "WBC", "HGB", "HCT", "PLT" in the last 72 hours.  No results for input(s): "NA", "K", "CL", "CO2", "GLUCOSE", "BUN", "CREATININE", "CALCIUM" in the last 72 hours.   Intake/Output Summary (Last 24 hours) at 03/26/2023 0731 Last data filed at 03/26/2023 0600 Gross per 24 hour  Intake 236 ml  Output 1000 ml  Net -764 ml        Physical Exam: Vital Signs Blood pressure (!) 163/69, pulse 79, temperature 98 F (36.7 C), temperature source Oral, resp. rate 17, height 5\' 11"  (1.803 m), weight 80.7 kg, SpO2 96%.   General: No acute distress Mood and affect are appropriate Heart: IRRR  no rubs murmurs or extra sounds Lungs: Clear to auscultation, breathing unlabored, no rales or wheezes Abdomen: Positive bowel sounds, soft nontender to palpation, nondistended Extremities: No clubbing, cyanosis, or edema Skin: No evidence of breakdown, no evidence of rash   Neuro:  pt is fairly alert, oriented. Fair insight and awareness. Speech still dysarthric. Normal language. CN notable for left CN VII, left inattetion. Shoulders both limited by ortho. RUE otherwise is 4 /5 and LUE is  4/5. RLE is 4-/5. LLE is 4-5 prox to distal. .Musculoskeletal: knees non tender without effusion . No pain with ROM Left ankle, non tender no evidence of effusion,  kyphotic posture   Assessment/Plan: 1. Functional deficits which require 3+ hours per day of interdisciplinary therapy in a comprehensive inpatient rehab setting. Physiatrist is providing close team supervision and 24 hour management of active medical problems listed below. Physiatrist and rehab team continue to assess barriers to discharge/monitor patient progress toward functional and medical goals  Care Tool:  Bathing     Body parts bathed by patient: Right arm, Left arm, Face, Front perineal area, Chest, Abdomen   Body parts bathed by helper: Right upper leg, Left upper leg, Buttocks, Left lower leg, Right lower leg     Bathing assist Assist Level: Maximal Assistance - Patient 24 - 49%     Upper Body Dressing/Undressing Upper body dressing   What is the patient wearing?: Pull over shirt    Upper body assist Assist Level: Maximal Assistance - Patient 25 - 49%    Lower Body Dressing/Undressing Lower body dressing      What is the patient wearing?: Pants     Lower body assist Assist for lower body dressing: Maximal Assistance - Patient 25 - 49%     Toileting Toileting    Toileting assist Assist for toileting: Maximal Assistance - Patient 25 - 49% (simulated)     Transfers Chair/bed transfer  Transfers assist     Chair/bed transfer assist level: Moderate Assistance - Patient 50 - 74%     Locomotion Ambulation   Ambulation assist   Ambulation activity did not occur: Safety/medical concerns (limited 2/2 high ankle pain)  Assist level: Minimal Assistance - Patient > 75% Assistive device: Walker-rolling Ulrick distance: 60   Walk 10 feet activity   Assist  Walk 10 feet activity did not  occur: Safety/medical concerns (limited 2/2 high ankle pain)  Assist level: Minimal Assistance - Patient > 75% Assistive device: Walker-rolling   Walk 50 feet activity   Assist Walk 50 feet with 2 turns activity did not occur: Safety/medical concerns (limited 2/2 high ankle pain)  Assist level: Minimal Assistance - Patient > 75% Assistive device: Walker-rolling    Walk 150 feet activity   Assist Walk 150 feet activity did not occur: Safety/medical concerns (limited 2/2 high ankle pain)         Walk 10 feet on uneven surface  activity   Assist Walk 10 feet on uneven surfaces activity did not occur: Safety/medical concerns (limited 2/2 high ankle pain)          Wheelchair     Assist Is the patient using a wheelchair?: Yes Type of Wheelchair: Manual    Wheelchair assist level: Dependent - Patient 0% Khamani wheelchair distance: 322ft    Wheelchair 50 feet with 2 turns activity    Assist        Assist Level: Dependent - Patient 0%   Wheelchair 150 feet activity     Assist      Assist Level: Dependent - Patient 0%   Blood pressure (!) 163/69, pulse 79, temperature 98 F (36.7 C), temperature source Oral, resp. rate 17, height 5\' 11"  (1.803 m), weight 80.7 kg, SpO2 96%.  Medical Problem List and Plan: 1. 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  Antithrombotics: -DVT/anticoagulation:  Pharmaceutical: Eliquis             -antiplatelet therapy: N/A 3. Pain Management:             No consistent pain c/os             - xray of left ankle with arthritic changes tib-talar joint and soft tissue swelling of heel and plantar foot             -continue voltaren gel to right chest wall and ankle TID.   -needs appropriate shoewear when up with therapy Ordered  CAM walker boot but ambulated without it in therapy no c/o yesterday   Bilateral knee pain OA add voltaren gel  4. Mood/Behavior/Sleep: Remeron 15 mg nightly.  Xanax 0.25 mg nightly as needed             -antipsychotic agents: N/A 5. Neuropsych/cognition: This patient is capable of making decisions on his own behalf. 6. Skin/Wound Care: Routine skin checks 7. Fluids/Electrolytes/Nutrition: Routine and analysis follow-up chemistries 8.  Newly diagnosed A-fib with RVR.  Continue Eliquis as well as Coreg 12.5 mg twice daily.  Plan outpatient follow-up consider DCCV  -remains in a fib, rate controlled 9.  CAD/CABG.  Imdur 15 mg  daily. 10.  Hypertension.  Monitor with increased mobility.  Hydralazine and Norvasc currently on hold due to soft blood pressure and resume as needed   Vitals:   03/25/23 1937 03/26/23 0613  BP: 138/62 (!) 163/69  Pulse: 80 79  Resp:    Temp: (!) 97.5 F (36.4 C) 98 F (36.7 C)  SpO2: 100% 96%  Increased coreg to 25mg   BID , improved   11.  History of prostate cancer with recent E. coli UTI/prostatitis.  Complete course of Augmentin 12.  CKD stage IV.  Baseline creatinine 2.0-2.2.  Follow-up chemistries on Monday    Latest Ref Rng & Units 03/22/2023    5:22 AM 03/21/2023   12:49 PM 03/19/2023    6:31 AM  BMP  Glucose 70 - 99 mg/dL 95  914  92   BUN 8 - 23 mg/dL 29  31  24    Creatinine 0.61 - 1.24 mg/dL 7.82  9.56  2.13   Sodium 135 - 145 mmol/L 136  132  135   Potassium 3.5 - 5.1 mmol/L 4.1  3.9  3.8   Chloride 98 - 111 mmol/L 100  103  105   CO2 22 - 32 mmol/L 22  19  20    Calcium 8.9 - 10.3 mg/dL 9.2  8.5  8.4    Creat at baseline, fluid intake poor BUN elevated over baseline  13.  GERD/abdominal pain/nausea and vomiting.  Seen by GI recommended empiric PPI daily, Carafate. - No plans for endoscopic intervention at this time             No recurrence 2/14 14.  Hyperlipidemia.  Lipitor 15.  History of mesenteric artery stenosis.  Follow-up outpatient 16.  Anemia of chronic disease.  Continue iron supplement.  Occult blood negative.   Follow-up CBC stable      Latest Ref Rng & Units 03/22/2023    5:22 AM 03/21/2023   12:49 PM 03/19/2023    6:31 AM  CBC  WBC 4.0 - 10.5 K/uL 7.5  9.3  11.5   Hemoglobin 13.0 - 17.0 g/dL 8.9  8.7  9.5   Hematocrit 39.0 - 52.0 % 27.3  26.1  28.7   Platelets 150 - 400 K/uL 321  304  293      LOS: 7 days A FACE TO FACE EVALUATION WAS PERFORMED  Erick Colace 03/26/2023, 7:31 AM

## 2023-03-26 NOTE — Progress Notes (Signed)
Speech Language Pathology Weekly Progress and Session Note  Patient Details  Name: Brandon Harris MRN: [id number] Date of Birth: May 03, 1928  Beginning of progress report period: March 20, 2023 End of progress report period: March 26, 2023  Today's Date: 03/26/2023 SLP Individual Time: 4098-1191 SLP Individual Time Calculation (min): 32 min  Short Term Goals: Week 1: SLP Short Term Goal 1 (Week 1): Pt will recall novel, functional information with 75% accuracy provided min-mod A cues SLP Short Term Goal 1 - Progress (Week 1): Met SLP Short Term Goal 2 (Week 1): Pt will solve mildly complex problems with 75% accuracy provided min A cues SLP Short Term Goal 2 - Progress (Week 1): Met SLP Short Term Goal 3 (Week 1): Pt will participate in alternating attention tasks during functional activities with 70% accuracy provided min-mod A cues SLP Short Term Goal 3 - Progress (Week 1): Not met    New Short Term Goals: Week 2: SLP Short Term Goal 1 (Week 2): STG = LTG due to elos  Weekly Progress Updates: Pt has made good gains and has met 2 of 3 STGs this reporting period due to improved cognition. Currently, patient continues to require min A for memory and problem solving. Patient continues to require increased cues for alternating attention, though likely due to fatigue experienced during ST sessions. Pt/family eduction ongoing. Pt would benefit from continued ST intervention to maximize cognitive skills in order to maximize functional independence at d/c.    Intensity: Minumum of 1-2 x/day, 30 to 90 minutes Frequency: 3 to 5 out of 7 days Duration/Length of Stay: 2/20 Treatment/Interventions: Cognitive remediation/compensation;Internal/external aids;Functional tasks;Patient/family education   Daily Session  Skilled Therapeutic Interventions:  Skilled therapy session focused on cognitive goals. At beginning of session, SLP provided minA for patient to review todays schedule and  answer comprehension questions. Patient unsure of PT/OT/ST acronyms, so SLP educated. SLP also provided education regarding different roles of each therapy. SLP continued to address cognitive goals by explaining memory strategies (WRAP: write, repeat, associate, picture) and their use. After education was complete, SLP provided minA for patient to recall steps to completing a service on a lawnmower. Patient with consistent fatigue throughout session, requiring tactile and verbal cues to stay attentive to task. Daughter reports he did not sleep much last night. 28 minutes missed this session due to fatigue. SLP will attempt to return as patient/therapist schedule allows. Patient left in bed with alarm set, daughter present and call bell in reach. Continue POC.     Pain None reported   Therapy/Group: Individual Therapy  Cagney Degrace M.A., CF-SLP 03/26/2023, 8:40 AM

## 2023-03-26 NOTE — Progress Notes (Signed)
 Physical Therapy Session Note  Patient Details  Name: Brandon Harris MRN: [id number] Date of Birth: Sep 18, 1928  Today's Date: 03/26/2023 PT Individual Time: 9518-8416 PT Individual Time Calculation (min): 34 min  and Today's Date: 03/26/2023 PT Missed Time: 26 Minutes Missed Time Reason: Patient fatigue  Short Term Goals: Week 1:  PT Short Term Goal 1 (Week 1): Pt will transfer supine > sit  using minA PT Short Term Goal 2 (Week 1): Pt will complete sit to stand using minA + LRAD PT Short Term Goal 3 (Week 1): Pt will maintain dynamic standing balance using CGA PT Short Term Goal 4 (Week 1): Pt will complete amb of 50ft using MinA + LRAD PT Short Term Goal 5 (Week 1): Pt will initiate stair training   Skilled Therapeutic Interventions/Progress Updates:  Patient supine in bed and eyes drifted to R on entrance to room. Patient alert and agreeable to PT session.   Patient with no pain complaint at start of session.  Dtr relates that pt has not been feeling well this day. Pt also relates feeling "run-down", "off", and weak. Dtr thinks that pt may be dehydrated. Orthostatic BP vitals initiated. Told pt that bed mobility and transfers to be assessed.   Therapeutic Activity: Bed Mobility: Pt performed supine > sit with close supervision and uses bedrails to complete. Is able to maintain seated balance throughout orthostatic vitals. Returned to supine with close supervision and good effort.  Transfers: Pt performed sit<>stand transfer with CGA and is able to maintain stance throughout vital assessment. Definite need of BLE extension into bed for balance - RW provided for initiation of more forward lean.  Uses RW to maintain balance.  Vitals provided below. RN notified and to discuss potential need for medication adjustment with PA.   Patient supine in bed at end of session with brakes locked, bed alarm set, and all needs within reach. Dtr in room.    ORTHOSTATIC VITAL SIGNS BP taken in  SUPINE: 133/64 (86); pulse 67 BP taken in initial SITTING: 116/66 (81); pulse initially indicative of a-fib and then normalizes to 82 BP taken in  initial STANDING: 102/52 (67); pulse 69  Pt missed 26 nmin of skilled therapy due to fatigue and illness. Will re-attempt as schedule and pt availability permits.   Therapy Documentation Precautions:  Precautions Precautions: Fall Restrictions Weight Bearing Restrictions Per Provider Order: No General: PT Amount of Missed Time (min): 26 Minutes PT Missed Treatment Reason: Patient fatigue Vital Signs: Therapy Vitals Patient Position (if appropriate): Orthostatic Vitals Pain:  No pain related this session. Pt does relate feeling "run-down", off, and ill.   Therapy/Group: Individual Therapy  Loel Dubonnet PT, DPT, CSRS 03/26/2023, 1:49 PM

## 2023-03-27 DIAGNOSIS — I63511 Cerebral infarction due to unspecified occlusion or stenosis of right middle cerebral artery: Secondary | ICD-10-CM | POA: Diagnosis not present

## 2023-03-27 LAB — CBC WITH DIFFERENTIAL/PLATELET
Abs Immature Granulocytes: 0.04 10*3/uL (ref 0.00–0.07)
Basophils Absolute: 0 10*3/uL (ref 0.0–0.1)
Basophils Relative: 1 %
Eosinophils Absolute: 0.5 10*3/uL (ref 0.0–0.5)
Eosinophils Relative: 7 %
HCT: 30 % — ABNORMAL LOW (ref 39.0–52.0)
Hemoglobin: 9.5 g/dL — ABNORMAL LOW (ref 13.0–17.0)
Immature Granulocytes: 1 %
Lymphocytes Relative: 26 %
Lymphs Abs: 2 10*3/uL (ref 0.7–4.0)
MCH: 29.8 pg (ref 26.0–34.0)
MCHC: 31.7 g/dL (ref 30.0–36.0)
MCV: 94 fL (ref 80.0–100.0)
Monocytes Absolute: 0.6 10*3/uL (ref 0.1–1.0)
Monocytes Relative: 8 %
Neutro Abs: 4.6 10*3/uL (ref 1.7–7.7)
Neutrophils Relative %: 57 %
Platelets: 387 10*3/uL (ref 150–400)
RBC: 3.19 MIL/uL — ABNORMAL LOW (ref 4.22–5.81)
RDW: 14.1 % (ref 11.5–15.5)
WBC: 7.8 10*3/uL (ref 4.0–10.5)
nRBC: 0 % (ref 0.0–0.2)

## 2023-03-27 LAB — COMPREHENSIVE METABOLIC PANEL
ALT: 23 U/L (ref 0–44)
AST: 29 U/L (ref 15–41)
Albumin: 2.6 g/dL — ABNORMAL LOW (ref 3.5–5.0)
Alkaline Phosphatase: 57 U/L (ref 38–126)
Anion gap: 8 (ref 5–15)
BUN: 17 mg/dL (ref 8–23)
CO2: 22 mmol/L (ref 22–32)
Calcium: 8.6 mg/dL — ABNORMAL LOW (ref 8.9–10.3)
Chloride: 104 mmol/L (ref 98–111)
Creatinine, Ser: 1.95 mg/dL — ABNORMAL HIGH (ref 0.61–1.24)
GFR, Estimated: 32 mL/min — ABNORMAL LOW (ref >60)
Glucose, Bld: 116 mg/dL — ABNORMAL HIGH (ref 70–99)
Potassium: 3.6 mmol/L (ref 3.5–5.1)
Sodium: 134 mmol/L — ABNORMAL LOW (ref 135–145)
Total Bilirubin: 0.4 mg/dL (ref 0.0–1.2)
Total Protein: 6.1 g/dL — ABNORMAL LOW (ref 6.5–8.1)

## 2023-03-27 NOTE — Progress Notes (Signed)
 Speech Language Pathology Daily Session Note  Patient Details  Name: Brandon Harris MRN: [id number] Date of Birth: 12/08/1928  Today's Date: 03/27/2023 SLP Individual Time: 1016-1059 SLP Individual Time Calculation (min): 43 min  Short Term Goals: Week 2: SLP Short Term Goal 1 (Week 2): STG = LTG due to elos  Skilled Therapeutic Interventions: Skilled therapy session focused on cognitive goals. Patient unable to recall memory strategies taught yesterday, therefore SLP educated. SLP prompted patient to utilize strategies to recall information in verbally presented paragraph. Patient utilized strategies with minA and recalled information from paragraph after a 10 minute delay with 70% accuracy and minA. Patient oriented to self, place, situation and date independently. Patient left in bed with alarm set and call bell in reach. Continue POC.   Pain Pain in Knee, verbalized no intervention required    Therapy/Group: Individual Therapy  Lonnell Chaput M.A., CF-SLP 03/27/2023, 7:32 AM

## 2023-03-27 NOTE — Progress Notes (Signed)
 PROGRESS NOTE   Subjective/Complaints:  Patient missed OT session yesterday due to reports of all over pain and fatigue.  Daughter was reporting that patient felt "rundown"" off", weak.  Vitals during PT session were positive for orthostatic hypotension with blood pressure going 1 33-1 16-1 02 with supine to sit to stand.  1/2 NS IV fluids were started at 75 cc/h, has gotten approximately 1 L overnight but urine output remains -400.     Some very mild hypertension overnight, 140s over 70s, otherwise vital stable. Mixed bowel and bladder continence, incontinent large, black bowel movement overnight.  Is on iron supplementation.  Today, patient is doing well.  No complaints or concerns.  ROS: Patient denies CP, SOB, N/V/D   Objective:   No results found.  No results for input(s): "WBC", "HGB", "HCT", "PLT" in the last 72 hours.  No results for input(s): "NA", "K", "CL", "CO2", "GLUCOSE", "BUN", "CREATININE", "CALCIUM" in the last 72 hours.   Intake/Output Summary (Last 24 hours) at 03/27/2023 0900 Last data filed at 03/27/2023 0703 Gross per 24 hour  Intake 1353.15 ml  Output 2100 ml  Net -746.85 ml        Physical Exam: Vital Signs Blood pressure (!) 142/73, pulse 77, temperature 98.2 F (36.8 C), temperature source Oral, resp. rate 18, height 5\' 11"  (1.803 m), weight 80.7 kg, SpO2 98%.   General: No acute distress.  Laying in bed. Mood and affect are appropriate Heart: IRRR  no rubs murmurs or extra sounds Lungs: Clear to auscultation, breathing unlabored, no rales or wheezes Abdomen: Positive bowel sounds, soft nontender to palpation, nondistended Extremities: No clubbing, cyanosis, or edema Skin: No evidence of breakdown, no evidence of rash   Neuro:  pt is fairly alert, oriented. Fair insight and awareness. Speech still dysarthric. Normal language. CN notable for left CN VII, left inattetion. Shoulders both  limited by ortho. RUE otherwise is 4 /5 and LUE is  4/5. RLE is 4-/5. LLE is 4-5 prox to distal. .  Musculoskeletal: knees non tender without effusion . No pain with ROM Left ankle, non tender no evidence of effusion,  kyphotic posture   Unchanged 2-15  Assessment/Plan: 1. Functional deficits which require 3+ hours per day of interdisciplinary therapy in a comprehensive inpatient rehab setting. Physiatrist is providing close team supervision and 24 hour management of active medical problems listed below. Physiatrist and rehab team continue to assess barriers to discharge/monitor patient progress toward functional and medical goals  Care Tool:  Bathing    Body parts bathed by patient: Right arm, Left arm, Face, Front perineal area, Chest, Abdomen   Body parts bathed by helper: Right upper leg, Left upper leg, Buttocks, Left lower leg, Right lower leg     Bathing assist Assist Level: Maximal Assistance - Patient 24 - 49%     Upper Body Dressing/Undressing Upper body dressing   What is the patient wearing?: Pull over shirt    Upper body assist Assist Level: Maximal Assistance - Patient 25 - 49%    Lower Body Dressing/Undressing Lower body dressing      What is the patient wearing?: Pants     Lower body assist Assist  for lower body dressing: Maximal Assistance - Patient 25 - 49%     Toileting Toileting    Toileting assist Assist for toileting: Maximal Assistance - Patient 25 - 49% (simulated)     Transfers Chair/bed transfer  Transfers assist     Chair/bed transfer assist level: Moderate Assistance - Patient 50 - 74%     Locomotion Ambulation   Ambulation assist   Ambulation activity did not occur: Safety/medical concerns (limited 2/2 high ankle pain)  Assist level: Minimal Assistance - Patient > 75% Assistive device: Walker-rolling [place] distance: 60   Walk 10 feet activity   Assist  Walk 10 feet activity did not occur: Safety/medical concerns (limited  2/2 high ankle pain)  Assist level: Minimal Assistance - Patient > 75% Assistive device: Walker-rolling   Walk 50 feet activity   Assist Walk 50 feet with 2 turns activity did not occur: Safety/medical concerns (limited 2/2 high ankle pain)  Assist level: Minimal Assistance - Patient > 75% Assistive device: Walker-rolling    Walk 150 feet activity   Assist Walk 150 feet activity did not occur: Safety/medical concerns (limited 2/2 high ankle pain)         Walk 10 feet on uneven surface  activity   Assist Walk 10 feet on uneven surfaces activity did not occur: Safety/medical concerns (limited 2/2 high ankle pain)         Wheelchair     Assist Is the patient using a wheelchair?: Yes Type of Wheelchair: Manual    Wheelchair assist level: Dependent - Patient 0% [place] wheelchair distance: 358ft    Wheelchair 50 feet with 2 turns activity    Assist        Assist Level: Dependent - Patient 0%   Wheelchair 150 feet activity     Assist      Assist Level: Dependent - Patient 0%   Blood pressure (!) 142/73, pulse 77, temperature 98.2 F (36.8 C), temperature source Oral, resp. rate 18, height 5\' 11"  (1.803 m), weight 80.7 kg, SpO2 98%.  Medical Problem List and Plan: 1. 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  Antithrombotics: -DVT/anticoagulation:  Pharmaceutical: Eliquis             -antiplatelet therapy: N/A 3. Pain Management:             No consistent pain c/os             - xray of left ankle with arthritic changes tib-talar joint and soft tissue swelling of heel and plantar foot             -continue voltaren gel to right chest wall and ankle TID.   -needs appropriate shoewear when up with  therapy Ordered  CAM walker boot but ambulated without it in therapy no c/o yesterday   Bilateral knee pain OA add voltaren gel   4. Mood/Behavior/Sleep: Remeron 15 mg nightly.  Xanax 0.25 mg nightly as needed             -antipsychotic agents: N/A 5. Neuropsych/cognition: This patient is capable of making decisions on his own behalf. 6. Skin/Wound Care: Routine  skin checks 7. Fluids/Electrolytes/Nutrition: Routine and analysis follow-up chemistries  -2-15: Approximately 1 L of 1 half-normal saline given overnight for orthostasis; BUN significantly improved, creatinine stable.  No further orthostasis today.  Has TED hose ordered.   8.  Newly diagnosed A-fib with RVR.  Continue Eliquis as well as Coreg 12.5 mg twice daily.  Plan outpatient follow-up consider DCCV  -remains in a fib, rate controlled  -2-15: IV fluids overnight as above, rate looks well-controlled.  DC IVF.  9.  CAD/CABG.  Imdur 15 mg daily. 10.  Hypertension.  Monitor with increased mobility.  Hydralazine and Norvasc currently on hold due to soft blood pressure and resume as needed   Vitals:   03/26/23 1950 03/27/23 0301  BP: 135/89 (!) 142/73  Pulse: 80 77  Resp: 18 18  Temp: 98.5 F (36.9 C) 98.2 F (36.8 C)  SpO2: 100% 98%  Increased coreg to 25mg  BID , improved  -2/15: Some mild hypertension, given orthostasis yesterday will add teds  and monitor for further medication adjustment tomorrow  11.  History of prostate cancer with recent E. coli UTI/prostatitis.  Complete course of Augmentin 12.  CKD stage IV.  Baseline creatinine 2.0-2.2.  Follow-up chemistries on Monday    Latest Ref Rng & Units 03/22/2023    5:22 AM 03/21/2023   12:49 PM 03/19/2023    6:31 AM  BMP  Glucose 70 - 99 mg/dL 95  161  92   BUN 8 - 23 mg/dL 29  31  24    Creatinine 0.61 - 1.24 mg/dL 0.96  0.45  4.09   Sodium 135 - 145 mmol/L 136  132  135   Potassium 3.5 - 5.1 mmol/L 4.1  3.9  3.8   Chloride 98 - 111 mmol/L 100  103  105   CO2 22 - 32  mmol/L 22  19  20    Calcium 8.9 - 10.3 mg/dL 9.2  8.5  8.4    Creat at baseline, fluid intake poor BUN elevated over baseline   2-15: Repeat CMP today--BUN much improved, creatinine stable.  Some mild hyponatremia but with half-normal saline so will likely self resolve.  13.  GERD/abdominal pain/nausea and vomiting.  Seen by GI recommended empiric PPI daily, Carafate. - No plans for endoscopic intervention at this time             No recurrence 2/14  14.  Hyperlipidemia.  Lipitor 15.  History of mesenteric artery stenosis.  Follow-up outpatient 16.  Anemia of chronic disease.  Continue iron supplement.  Occult blood negative.   Follow-up CBC stable      Latest Ref Rng & Units 03/22/2023    5:22 AM 03/21/2023   12:49 PM 03/19/2023    6:31 AM  CBC  WBC 4.0 - 10.5 K/uL 7.5  9.3  11.5   Hemoglobin 13.0 - 17.0 g/dL 8.9  8.7  9.5   Hematocrit 39.0 - 52.0 % 27.3  26.1  28.7   Platelets 150 - 400 K/uL 321  304  293     - 2/15: Repeat CBC today due to generalized feelings of illness, orthostatic hypotension, and large black bowel movement overnight.--Hemoglobin stable 9.5.     LOS: 8 days A FACE TO FACE EVALUATION WAS PERFORMED  Angelina Sheriff 03/27/2023, 9:00 AM

## 2023-03-27 NOTE — Plan of Care (Signed)
  Problem: Consults Goal: RH STROKE PATIENT EDUCATION Description: See Patient Education module for education specifics  Outcome: Progressing   Problem: RH BLADDER ELIMINATION Goal: RH STG MANAGE BLADDER WITH ASSISTANCE Description: STG Manage Bladder With toileting Assistance Outcome: Progressing   Problem: RH SAFETY Goal: RH STG ADHERE TO SAFETY PRECAUTIONS W/ASSISTANCE/DEVICE Description: STG Adhere to Safety Precautions With cues  Assistance/Device. Outcome: Progressing   Problem: RH KNOWLEDGE DEFICIT Goal: RH STG INCREASE KNOWLEDGE OF HYPERTENSION Description: Patient and daughter will be able to manage HTN using educational resources for medications and dietary modification independently Outcome: Progressing Goal: RH STG INCREASE KNOWLEGDE OF HYPERLIPIDEMIA Description: Patient and daughter will be able to manage HLD using educational resources for medications and dietary modification independently Outcome: Progressing Goal: RH STG INCREASE KNOWLEDGE OF STROKE PROPHYLAXIS Description: Patient and daughter will be able to manage secondary risks using educational resources for medications and dietary modification independently Outcome: Progressing

## 2023-03-27 NOTE — Progress Notes (Signed)
 Occupational Therapy Session Note  Patient Details  Name: Brandon Harris MRN: [id number] Date of Birth: 02-06-1929  Today's Date: 03/27/2023 OT Individual Time: 1305-1400 OT Individual Time Calculation (min): 55 min    Short Term Goals: Week 1:  OT Short Term Goal 1 (Week 1): Pt will complete sit<>stands during BADLs with mod A x1 consistly using LRAD OT Short Term Goal 2 (Week 1): Pt will complete toilet transfers mod A using LRAD OT Short Term Goal 3 (Week 1): Pt will tolerate standing >1 minute during functional tasks with min A using LRAD OT Short Term Goal 4 (Week 1): Pt will complete grooming/hygiene tasks with min A  Skilled Therapeutic Interventions/Progress Updates:  Skilled OT intervention completed with focus on toileting needs, ambulatory endurance, dynamic standing balance/tolerance, cognition. Pt received in bed with NT assisting with urinal management, agreeable to session. No pain reported.  Pt with incontinent and further continent urinary void. Upon rolling with min A for brief change, pt with incontinent BM (black). OT assisted NT with pericare, brief change and donning of pants with total A needed, however pt able to bridge with 1/2 buttock clearance for LB dressing. Able to roll R<>L with min A.   Transitioned > EOB with HOB flat, (pt has standard bed at home) with min A only at end of transition for trunk elevation. Donned shoes with [place]a A. Completed CGA sit > stand with RW, then ambulated about 125 ft to gym with CGA using RW and w/c follow. Cues needed for stride length, keeping BLE within the RW frame and safety during turns. Pt with consistent crouched posture despite cues for standing "proud."  Pt participated in the following activities in standing with CGA using RW to address standing tolerance and cognitive strategies needed for independence and safety with BADL management: -Motor speed dot test- 7.46 sec, mod difficulty with drumstick needed due to bilateral  shoulder ROM deficits; results indicative of slower motor processing speed and increased risk of falls -Bell cancellation test- 3:50 min; 6 misses with no particular order; made several mistakes but more related to [facility] deficits with reaching -Trail Making A- 2 min, 0 errors -Trail Making B- unable to finish in reasonable time; mod-Eyal assist needed for sequencing/cognition  Transported dependently in w/c back to room. Completed CGA sit > stand and ambulatory transfer with RW > EOB. Doffed shoes dependently. Supervision for sit > supine. Pt remained semi upright in bed, with bed alarm on/activated, and with all needs in reach at end of session.   Therapy Documentation Precautions:  Precautions Precautions: Fall Restrictions Weight Bearing Restrictions Per Provider Order: No    Therapy/Group: Individual Therapy  Chesney Klimaszewski E Faustine Tates, MS, OTR/L  03/27/2023, 2:00 PM

## 2023-03-27 NOTE — Progress Notes (Signed)
 Physical Therapy Weekly Progress Note  Patient Details  Name: Brandon Harris MRN: [id number] Date of Birth: December 11, 1928  Beginning of progress report period: March 20, 2023 End of progress report period: March 27, 2023  Today's Date: 03/27/2023 PT Individual Time: 0918-1000 PT Individual Time Calculation (min): 42 min   Patient has met 4 of 5 short term goals.  Pt making good but slow progress towards goals due to barriers from fatigue and dehydration preventing complete participation in some scheduled therapy sessions. When feeling good, pt is able to complete bed mobility with supervision and use of bedrails, completes transfers with supervision/ CGA to RW and ambulates up to 100 ft distances using RW and CGA/ close supervision. Has not yet progressed to stair training d/t variable fatigue levels. He demos reduced awareness of fatigue levels at times despite reduced alertness. Is mainly limited by low but improving activity tolerance as well as generalized and BLE weakness. Dtr has not yet participated in family education to prepare for d/c home, but does assist pt with dressing while in bed each morning. Is expected to participate this week for more mobility training with pt.     Patient continues to demonstrate the following deficits muscle weakness, decreased cardiorespiratoy endurance, decreased attention to left, decreased attention and decreased safety awareness, and decreased standing balance and decreased balance strategies and therefore will continue to benefit from skilled PT intervention to increase functional independence with mobility.  Patient progressing toward long term goals..  Continue plan of care.  PT Short Term Goals Week 1:  PT Short Term Goal 1 (Week 1): Pt will transfer supine > sit  using minA PT Short Term Goal 2 (Week 1): Pt will complete sit to stand using minA + LRAD PT Short Term Goal 3 (Week 1): Pt will maintain dynamic standing balance using CGA PT Short  Term Goal 4 (Week 1): Pt will complete amb of 57ft using MinA + LRAD PT Short Term Goal 5 (Week 1): Pt will initiate stair training Week 2:     Skilled Therapeutic Interventions/Progress Updates:      Skilled Intervention: Patient supine in bed on entrance to room. Patient alert. IV running for fluids. With min encouragement, pt willing to participate with therapist for mobility.   Patient with no pain complaint at start of session. Dtr relates pt feeling better with fluid administration.   Therapeutic Activity: Bed Mobility: Pt performed supine <> sit with increased effort but completes with supervision and no need for vc for technique. Transfers: Pt performed sit<>stand and stand pivot transfers throughout session from various seat heights and surfaces with close supervision. Initial rise to stand required CGA but improves throughout session to close supervision d/t increasing movement. Mild vc for forward lean for improved weight shift over feet for improved balance upon rise to stand.   RN providing morning meds while seated EOB and disconnected IV line for therapy session.   Gait Training:  Pt ambulated 100' x2  with use of RW and light CGA.  Demonstrated trunk flexion and intermittent catch of L toe to floor during ambulation. VC provided throughout for upright posture, maintaining close proximity to RW, and increased step height bilaterally with increased focus to LLE.   Patient supine in bed at end of session with brakes locked, bed alarm set, and all needs within reach.  Disc with dtr re: more inclusion with therapy over the next week to prepare for d/c home.    Therapy Documentation Precautions:  Precautions Precautions:  Fall Restrictions Weight Bearing Restrictions Per Provider Order: No  Pain:  On initial rise to stand, pt relates pain in knees, but pain improves during session.   Therapy/Group: Individual Therapy  Loel Dubonnet PT, DPT, CSRS 03/27/2023, 6:30 PM

## 2023-03-28 DIAGNOSIS — I63511 Cerebral infarction due to unspecified occlusion or stenosis of right middle cerebral artery: Secondary | ICD-10-CM | POA: Diagnosis not present

## 2023-03-28 MED ORDER — FERROUS SULFATE 325 (65 FE) MG PO TABS
325.0000 mg | ORAL_TABLET | ORAL | Status: DC
  Administered 2023-03-30: 325 mg via ORAL
  Filled 2023-03-28: qty 1

## 2023-03-28 MED ORDER — AMLODIPINE BESYLATE 5 MG PO TABS
5.0000 mg | ORAL_TABLET | Freq: Every day | ORAL | Status: DC
  Administered 2023-03-29 – 2023-03-30 (×2): 5 mg via ORAL
  Filled 2023-03-28 (×2): qty 1

## 2023-03-28 NOTE — Plan of Care (Signed)
  Problem: Consults Goal: RH STROKE PATIENT EDUCATION Description: See Patient Education module for education specifics  Outcome: Progressing   Problem: RH BLADDER ELIMINATION Goal: RH STG MANAGE BLADDER WITH ASSISTANCE Description: STG Manage Bladder With toileting Assistance Outcome: Progressing   Problem: RH SAFETY Goal: RH STG ADHERE TO SAFETY PRECAUTIONS W/ASSISTANCE/DEVICE Description: STG Adhere to Safety Precautions With cues  Assistance/Device. Outcome: Progressing   Problem: RH KNOWLEDGE DEFICIT Goal: RH STG INCREASE KNOWLEDGE OF HYPERTENSION Description: Patient and daughter will be able to manage HTN using educational resources for medications and dietary modification independently Outcome: Progressing Goal: RH STG INCREASE KNOWLEGDE OF HYPERLIPIDEMIA Description: Patient and daughter will be able to manage HLD using educational resources for medications and dietary modification independently Outcome: Progressing Goal: RH STG INCREASE KNOWLEDGE OF STROKE PROPHYLAXIS Description: Patient and daughter will be able to manage secondary risks using educational resources for medications and dietary modification independently Outcome: Progressing

## 2023-03-28 NOTE — Progress Notes (Signed)
 Occupational Therapy Session Note  Patient Details  Name: Brandon Harris MRN: [id number] Date of Birth: [birth year]  Today's Date: 03/28/2023 OT Individual Time: 1115-1150 OT Individual Time Calculation (min): 35 min  and Today's Date: 03/28/2023 OT Missed Time: 15 Minutes Missed Time Reason: Patient fatigue   Short Term Goals: Week 1:  OT Short Term Goal 1 (Week 1): Pt will complete sit<>stands during BADLs with mod A x1 consistly using LRAD OT Short Term Goal 2 (Week 1): Pt will complete toilet transfers mod A using LRAD OT Short Term Goal 3 (Week 1): Pt will tolerate standing >1 minute during functional tasks with min A using LRAD OT Short Term Goal 4 (Week 1): Pt will complete grooming/hygiene tasks with min A Week 2:     Skilled Therapeutic Interventions/Progress Updates:    1:1 Pt declined formal self care and was already dressed this morning. He reported he was fatigue and wiped out from earlier therapy/ exercises. Was able to encourage out of bathroom for toileting in the bathroom and a walk for cardiopulmonary endurance. Pt came to EOB with supervision and donned shoes with setup. Pt ambulated to the bathroom with supervision with RW with verbal cues for safe management and placement of RW. Pt performed toileting 3/3 steps in standing as well as trying to void. Pt then ambulated out of the bathroom and out of the room and down the hallway ~50 feet and returned back to the room and got into bed with supervision. Tried to encourage going to tub room and navigating stepping over the tub v using equipment to access his shower in a way similar to home - however pt declined today due to fatigue. Did discuss with daughter and pt pt's progress and ongoing challenges with endurance.   Also did talk about the upcoming weather this week and his d/c. Emailed team about possibly moving up date in lieu of snow and he is on target to meeting his goals.   Pt left resting in the bed with daughter at  bedside.   Therapy Documentation Precautions:  Precautions Precautions: Fall Restrictions Weight Bearing Restrictions Per Provider Order: No General: General OT Amount of Missed Time: 15 Minutes Vital Signs: Therapy Vitals Pulse Rate: 74 BP: (!) 159/80 Oxygen Therapy SpO2: 100 % O2 Device: Room Air Pain: Pain Assessment Pain Scale: 0-10 Pain Score: 0-No pain Pt does report fatigue but was wiling to do a little bit in session    Therapy/Group: Individual Therapy  Roney Mans [facility] 03/28/2023, 11:54 AM

## 2023-03-28 NOTE — Progress Notes (Signed)
 Physical Therapy Session Note  Patient Details  Name: Brandon Harris MRN: [id number] Date of Birth: [birth year]  Today's Date: 03/28/2023 PT Individual Time: 0900-1001 PT Individual Time Calculation (min): 61 min   Short Term Goals: Week 1:  PT Short Term Goal 1 (Week 1): Pt will transfer supine > sit  using minA PT Short Term Goal 1 - Progress (Week 1): Met PT Short Term Goal 2 (Week 1): Pt will complete sit to stand using minA + LRAD PT Short Term Goal 2 - Progress (Week 1): Met PT Short Term Goal 3 (Week 1): Pt will maintain dynamic standing balance using CGA PT Short Term Goal 3 - Progress (Week 1): Met PT Short Term Goal 4 (Week 1): Pt will complete amb of 46ft using MinA + LRAD PT Short Term Goal 4 - Progress (Week 1): Met PT Short Term Goal 5 (Week 1): Pt will initiate stair training PT Short Term Goal 5 - Progress (Week 1): Progressing toward goal  Skilled Therapeutic Interventions/Progress Updates:     Pt received supine in bed. Daughter present. Pt agreeable to PT tx. Reports absent pain. QS x 20, SLR x 20, hip abduction/adduction x 20, heel slides x 20, hamstring digs x 20; bridge x 20. BP supine 147/55, pulse 85. Retook after 3 min 136/59 supine, pulse 82. Alcario Drought, RN, notified of BP readings; she approved and said pt could continue in WB with therex. Seated BP 121/69 pulse 91. Standing BP 105/56, pulse 98. Pt denied dizziness with all readings. STS from EOB moderate to min A to initiate, CGA to min A to complete x 10 with up to 5 sec balance in btwn with RW used. Standing tolerance measured at 1 min 47 sec with heavy posteriorlateral L lean with Pt Tremel A and then improving to 2'51 sec with good balance with PT CGA after short rest break with usage of RW for both. Seated bil LAQ x 20; seated hip abduction/adduction combo x 20; seated iso LAQ with ankle pump x 20. All bed mobility with S. Pt left supine in bed, bed alarm on, all needs within reach. No complaints of pain. BP readings  given to RN for record.   Therapy Documentation Precautions:  Precautions Precautions: Fall Restrictions Weight Bearing Restrictions Per Provider Order: No       Therapy/Group: Individual Therapy  Luna Fuse 03/28/2023, 6:37 AM

## 2023-03-28 NOTE — Progress Notes (Signed)
 PROGRESS NOTE   Subjective/Complaints: No acute complaints.  Patient does endorse some ongoing orthostasis with movement, is agreeable to improve p.o. intakes and avoid IV fluids today.  Blood pressure continues to elevate, 160s over 70s today.  Other vital stable. Large and medium incontinent bowel movements yesterday.  ROS: Patient denies CP, SOB, N/V/D + orthostasis  Objective:   No results found.  Recent Labs    03/27/23 1010  WBC 7.8  HGB 9.5*  HCT 30.0*  PLT 387    Recent Labs    03/27/23 1010  NA 134*  K 3.6  CL 104  CO2 22  GLUCOSE 116*  BUN 17  CREATININE 1.95*  CALCIUM 8.6*     Intake/Output Summary (Last 24 hours) at 03/28/2023 0845 Last data filed at 03/28/2023 1610 Gross per 24 hour  Intake 1296 ml  Output 1525 ml  Net -229 ml        Physical Exam: Vital Signs Blood pressure (!) 159/80, pulse 74, temperature 98 F (36.7 C), temperature source Oral, resp. rate 17, height 5\' 11"  (1.803 m), weight 80.7 kg, SpO2 100%.   General: No acute distress.  Sitting up in bed. Mood and affect are appropriate Heart: Regular rate and rhythm, no obvious murmurs, rubs, or gallops. Lungs: Clear to auscultation, breathing unlabored, no rales or wheezes Abdomen: Positive bowel sounds, soft nontender to palpation, nondistended Extremities: No clubbing, cyanosis, or edema Skin: No evidence of breakdown, no evidence of rash Neuro: Awake, alert, oriented to self and place.  Not time.  Approximately equal strength in bilateral upper and lower extremities.  Some mild left inattention.   Prior exams: Neuro:  pt is fairly alert, oriented. Fair insight and awareness. Speech still dysarthric. Normal language. CN notable for left CN VII, left inattetion. Shoulders both limited by ortho. RUE otherwise is 4 /5 and LUE is  4/5. RLE is 4-/5. LLE is 4-5 prox to distal. .  Musculoskeletal: knees non tender without  effusion . No pain with ROM Left ankle, non tender no evidence of effusion,  kyphotic posture    Assessment/Plan: 1. Functional deficits which require 3+ hours per day of interdisciplinary therapy in a comprehensive inpatient rehab setting. Physiatrist is providing close team supervision and 24 hour management of active medical problems listed below. Physiatrist and rehab team continue to assess barriers to discharge/monitor patient progress toward functional and medical goals  Care Tool:  Bathing    Body parts bathed by patient: Right arm, Left arm, Face, Front perineal area, Chest, Abdomen   Body parts bathed by helper: Right upper leg, Left upper leg, Buttocks, Left lower leg, Right lower leg     Bathing assist Assist Level: Maximal Assistance - Patient 24 - 49%     Upper Body Dressing/Undressing Upper body dressing   What is the patient wearing?: Pull over shirt    Upper body assist Assist Level: Maximal Assistance - Patient 25 - 49%    Lower Body Dressing/Undressing Lower body dressing      What is the patient wearing?: Pants     Lower body assist Assist for lower body dressing: Maximal Assistance - Patient 25 - 49%  Toileting Toileting    Toileting assist Assist for toileting: Maximal Assistance - Patient 25 - 49% (simulated)     Transfers Chair/bed transfer  Transfers assist     Chair/bed transfer assist level: Moderate Assistance - Patient 50 - 74%     Locomotion Ambulation   Ambulation assist   Ambulation activity did not occur: Safety/medical concerns (limited 2/2 high ankle pain)  Assist level: Minimal Assistance - Patient > 75% Assistive device: Walker-rolling [place] distance: 60   Walk 10 feet activity   Assist  Walk 10 feet activity did not occur: Safety/medical concerns (limited 2/2 high ankle pain)  Assist level: Minimal Assistance - Patient > 75% Assistive device: Walker-rolling   Walk 50 feet activity   Assist Walk 50 feet  with 2 turns activity did not occur: Safety/medical concerns (limited 2/2 high ankle pain)  Assist level: Minimal Assistance - Patient > 75% Assistive device: Walker-rolling    Walk 150 feet activity   Assist Walk 150 feet activity did not occur: Safety/medical concerns (limited 2/2 high ankle pain)         Walk 10 feet on uneven surface  activity   Assist Walk 10 feet on uneven surfaces activity did not occur: Safety/medical concerns (limited 2/2 high ankle pain)         Wheelchair     Assist Is the patient using a wheelchair?: Yes Type of Wheelchair: Manual    Wheelchair assist level: Dependent - Patient 0% [place] wheelchair distance: 342ft    Wheelchair 50 feet with 2 turns activity    Assist        Assist Level: Dependent - Patient 0%   Wheelchair 150 feet activity     Assist      Assist Level: Dependent - Patient 0%   Blood pressure (!) 159/80, pulse 74, temperature 98 F (36.7 C), temperature source Oral, resp. rate 17, height 5\' 11"  (1.803 m), weight 80.7 kg, SpO2 100%.  Medical Problem List and Plan: 1. 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  Antithrombotics: -DVT/anticoagulation:  Pharmaceutical: Eliquis             -antiplatelet therapy: N/A 3. Pain Management:             No consistent pain c/os             - xray of left ankle with arthritic changes tib-talar joint and soft tissue swelling of heel and plantar foot             -continue voltaren gel to right chest wall and ankle TID.   -needs appropriate shoewear when up with therapy Ordered  CAM walker boot but ambulated without it in therapy no c/o yesterday   Bilateral knee pain OA add voltaren gel   -2-16: No complaints of  pain over this weekend  4. Mood/Behavior/Sleep: Remeron 15 mg nightly.  Xanax 0.25 mg nightly as needed             -antipsychotic agents: N/A 5. Neuropsych/cognition: This patient is capable of making decisions on his own behalf. 6. Skin/Wound Care: Routine skin checks 7. Fluids/Electrolytes/Nutrition: Routine and  analysis follow-up chemistries  -2-15: Approximately 1 L of 1 half-normal saline given overnight for orthostasis; BUN significantly improved, creatinine stable.  No further orthostasis today.  Has TED hose ordered.  -2-16: Patient wishes trial of encouraging p.o. fluids; recommended 6 to 8 to 60 cc cups today.  8.  Newly diagnosed A-fib with RVR.  Continue Eliquis as well as Coreg 12.5 mg twice daily.  Plan outpatient follow-up consider DCCV  -remains in a fib, rate controlled  -2-15: IV fluids overnight as above, rate looks well-controlled.  DC IVF.  9.  CAD/CABG.  Imdur 15 mg daily. 10.  Hypertension.  Monitor with increased mobility.  Hydralazine and Norvasc currently on hold due to soft blood pressure and resume as needed   Vitals:   03/28/23 0529 03/28/23 0826  BP: (!) 166/75 (!) 159/80  Pulse: 77 74  Resp: 17   Temp: 98 F (36.7 C)   SpO2: 95% 100%  Increased coreg to 25mg  BID , improved  -2/15: Some mild hypertension, given orthostasis yesterday will add teds  and monitor for further medication adjustment tomorrow 2/16: Blood pressure increasing today; we will get another set of orthostats, resume Norvasc at 5 mg (per allergies, cannot dose titrate beyond this, may benefit from switch to ACE)  11.  History of prostate cancer with recent E. coli UTI/prostatitis.  Complete course of Augmentin 12.  CKD stage IV.  Baseline creatinine 2.0-2.2.  Follow-up chemistries on Monday    Latest Ref Rng & Units 03/27/2023   10:10 AM 03/22/2023    5:22 AM 03/21/2023   12:49 PM  BMP  Glucose 70 - 99 mg/dL 981  95  191   BUN 8 - 23 mg/dL 17  29  31    Creatinine 0.61 - 1.24 mg/dL  4.78  2.95  6.21   Sodium 135 - 145 mmol/L 134  136  132   Potassium 3.5 - 5.1 mmol/L 3.6  4.1  3.9   Chloride 98 - 111 mmol/L 104  100  103   CO2 22 - 32 mmol/L 22  22  19    Calcium 8.9 - 10.3 mg/dL 8.6  9.2  8.5    Creat at baseline, fluid intake poor BUN elevated over baseline   2-15: Repeat CMP today--BUN much improved, creatinine stable.  Some mild hyponatremia but with half-normal saline so will likely self resolve.  13.  GERD/abdominal pain/nausea and vomiting.  Seen by GI recommended empiric PPI daily, Carafate. - No plans for endoscopic intervention at this time             No recurrence 2/14    14.  Hyperlipidemia.  Lipitor 15.  History of mesenteric artery stenosis.  Follow-up outpatient 16.  Anemia of chronic disease.  Continue iron supplement.  Occult blood negative.   Follow-up CBC stable      Latest Ref Rng & Units 03/27/2023   10:10 AM 03/22/2023    5:22 AM 03/21/2023   12:49 PM  CBC  WBC 4.0 - 10.5 K/uL 7.8  7.5  9.3   Hemoglobin 13.0 - 17.0 g/dL 9.5  8.9  8.7   Hematocrit 39.0 - 52.0 % 30.0  27.3  26.1   Platelets 150 - 400 K/uL 387  321  304     - 2/15: Repeat CBC today due to generalized feelings of illness, orthostatic hypotension, and large black bowel movement overnight.--Hemoglobin stable 9.5.     - 2/16: Multiple bowel movements yesterday, incontinent.  Reduce iron tablets to  every other day to improve GI upset  LOS: 9 days A FACE TO FACE EVALUATION WAS PERFORMED  Angelina Sheriff 03/28/2023, 8:45 AM

## 2023-03-28 NOTE — Progress Notes (Signed)
 Occupational Therapy Session Note  Patient Details  Name: BRODEE MAURITZ MRN: [id number] Date of Birth: 1928/07/28  Today's Date: 03/28/2023 OT Individual Time: 1400-1445 OT Individual Time Calculation (min): 45 min    Short Term Goals: Week 1:  OT Short Term Goal 1 (Week 1): Pt will complete sit<>stands during BADLs with mod A x1 consistly using LRAD OT Short Term Goal 2 (Week 1): Pt will complete toilet transfers mod A using LRAD OT Short Term Goal 3 (Week 1): Pt will tolerate standing >1 minute during functional tasks with min A using LRAD OT Short Term Goal 4 (Week 1): Pt will complete grooming/hygiene tasks with min A  Skilled Therapeutic Interventions/Progress Updates:      Therapy Documentation Precautions:  Precautions Precautions: Fall Restrictions Weight Bearing Restrictions Per Provider Order: No General: "I am tired" Pt supine in bed upon OT arrival, agreeable to OT session. Pt declined wanting to get OOB to do therapy 2/2 fatigue but agreeable to bed level exercises. Daughter present for session.   Pain: no pain reported  Exercises: Pt issued supine UE kinesthetic/theraband HEP in order to increase functional strength, endurance and activity tolerance in order to increase independence in ADLs such as bathing. Pt issued yellow and red theraband and discussed direction/technique of exercises, demonstrating verbal understanding. Pt completed 3x10 exercises at W/C level listed below:  Exercises - Seated Shoulder Shrugs  - 1 x daily - 7 x weekly - 3 sets - 10 reps - Seated Boxing Jabs  - 1 x daily - 7 x weekly - 3 sets - 10 reps - Seated Scapular Protraction and Retraction  - 1 x daily - 7 x weekly - 3 sets - 10 reps - Seated Elbow Extension with Self-Anchored Resistance  - 1 x daily - 7 x weekly - 3 sets - 10 reps - Seated Elbow Flexion with Self-Anchored Resistance  - 1 x daily - 7 x weekly - 3 sets - 10 reps   Other Treatments: Pt and daughter concerned about  weather on D/C day, wanting to potentially D/C Wednesday. OT messaged primary OT and PT about concerns for D/C.   Pt supine in bed with bed alarm activated, 2 bed rails up, call light within reach and 4Ps assessed.   Therapy/Group: Individual Therapy  Velia Meyer, OTD, OTR/L 03/28/2023, 3:59 PM

## 2023-03-29 DIAGNOSIS — I4891 Unspecified atrial fibrillation: Secondary | ICD-10-CM | POA: Diagnosis not present

## 2023-03-29 DIAGNOSIS — M19072 Primary osteoarthritis, left ankle and foot: Secondary | ICD-10-CM | POA: Diagnosis not present

## 2023-03-29 DIAGNOSIS — I10 Essential (primary) hypertension: Secondary | ICD-10-CM | POA: Diagnosis not present

## 2023-03-29 DIAGNOSIS — I63511 Cerebral infarction due to unspecified occlusion or stenosis of right middle cerebral artery: Secondary | ICD-10-CM | POA: Diagnosis not present

## 2023-03-29 MED ORDER — ACETAMINOPHEN 325 MG PO TABS
650.0000 mg | ORAL_TABLET | ORAL | Status: AC | PRN
Start: 2023-03-29 — End: ?

## 2023-03-29 NOTE — Plan of Care (Signed)
  Problem: Consults Goal: RH STROKE PATIENT EDUCATION Description: See Patient Education module for education specifics  Outcome: Progressing   Problem: RH BLADDER ELIMINATION Goal: RH STG MANAGE BLADDER WITH ASSISTANCE Description: STG Manage Bladder With toileting Assistance Outcome: Progressing   Problem: RH SAFETY Goal: RH STG ADHERE TO SAFETY PRECAUTIONS W/ASSISTANCE/DEVICE Description: STG Adhere to Safety Precautions With cues  Assistance/Device. Outcome: Progressing   Problem: RH KNOWLEDGE DEFICIT Goal: RH STG INCREASE KNOWLEDGE OF HYPERTENSION Description: Patient and daughter will be able to manage HTN using educational resources for medications and dietary modification independently Outcome: Progressing Goal: RH STG INCREASE KNOWLEGDE OF HYPERLIPIDEMIA Description: Patient and daughter will be able to manage HLD using educational resources for medications and dietary modification independently Outcome: Progressing Goal: RH STG INCREASE KNOWLEDGE OF STROKE PROPHYLAXIS Description: Patient and daughter will be able to manage secondary risks using educational resources for medications and dietary modification independently Outcome: Progressing

## 2023-03-29 NOTE — Progress Notes (Signed)
 Physical Therapy Session Note  Patient Details  Name: Brandon Harris MRN: [id number] Date of Birth: 03-01-1928  Today's Date: 03/29/2023 PT Individual Time: 4098-1191 PT Individual Time Calculation (min): 56 min   Short Term Goals: Week 1:  PT Short Term Goal 1 (Week 1): Pt will transfer supine > sit  using minA PT Short Term Goal 1 - Progress (Week 1): Met PT Short Term Goal 2 (Week 1): Pt will complete sit to stand using minA + LRAD PT Short Term Goal 2 - Progress (Week 1): Met PT Short Term Goal 3 (Week 1): Pt will maintain dynamic standing balance using CGA PT Short Term Goal 3 - Progress (Week 1): Met PT Short Term Goal 4 (Week 1): Pt will complete amb of 43ft using MinA + LRAD PT Short Term Goal 4 - Progress (Week 1): Met PT Short Term Goal 5 (Week 1): Pt will initiate stair training PT Short Term Goal 5 - Progress (Week 1): Progressing toward goal Week 2:  PT Short Term Goal 1 (Week 2): STG = LTG d/t ELOS  Skilled Therapeutic Interventions/Progress Updates:  Patient supine in bed and asleep on entrance to room.   Patient alert and agreeable to PT session.   Patient with no pain complaint at start of session.  Discussion with dtr re: pt's progress and need for add'l supervision once home. Initially pt will require close supervision for all mobility.  Therapeutic Activity: Bed Mobility: Pt performed supine <> sit with ***. VC/ tc required for ***. Transfers: Pt performed sit<>stand and stand pivot transfers throughout session with ***. Provided vc/ tc for***.  Gait Training:  Pt ambulated *** ft using *** with ***. Demonstrated ***. Provided vc/ tc for ***.  Wheelchair Mobility:  Pt propelled wheelchair *** feet with ***. Provided vc/ tc for ***.  Neuromuscular Re-ed: NMR facilitated during session with focus on ***. Pt guided in ***. NMR performed for improvements in motor control and coordination, balance, sequencing, judgement, and self confidence/ efficacy in  performing all aspects of mobility at highest level of independence.   Therapeutic Exercise: Pt performed the following exercises with vc/ tc for proper technique. ***  Patient *** at end of session with brakes locked, *** alarm set, and all needs within reach.   Therapy Documentation Precautions:  Precautions Precautions: Fall Restrictions Weight Bearing Restrictions Per Provider Order: No General:   Vital Signs:   Pain:    Therapy/Group: Individual Therapy  Loel Dubonnet PT, DPT, CSRS 03/29/2023, 6:18 PM

## 2023-03-29 NOTE — Progress Notes (Signed)
 Speech Language Pathology Daily Session Note  Patient Details  Name: Brandon Harris MRN: [id number] Date of Birth: 1928/07/31  Today's Date: 03/29/2023 SLP Individual Time: 0133-0229 SLP Individual Time Calculation (min): 56 min  Short Term Goals: Week 2: SLP Short Term Goal 1 (Week 2): STG = LTG due to elos  Skilled Therapeutic Interventions:  Patient was seen in PM to address cognitive re- training. Pt was alert upon SLP arrival and seen in bed. His dtr was present and participated intermittently throughout session. Pt oriented to time, place, and situation. SLP challenged pt in abstract reasoning through a compare and contrast activity. Pt completed task with 80% acc and min A. SLP further addressed problem solving through verbal presentation of safe and unsafe scenarios. Pt correctly distinguished between safe and unsafe situations with 90% acc and sup A. 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 At conclusion of session pt was left in bed with call button within reach and bed alarm active. SLP to continue POC.   Pain  Unrated chronic pain  Therapy/Group: Individual Therapy  Renaee Munda 03/29/2023, 3:52 PM

## 2023-03-29 NOTE — Progress Notes (Signed)
 PROGRESS NOTE   Subjective/Complaints:  Daughter worried about winter weather on THursday , asking about early d/c  ROS: Patient denies CP, SOB, N/V/D + orthostasis  Objective:   No results found.  Recent Labs    03/27/23 1010  WBC 7.8  HGB 9.5*  HCT 30.0*  PLT 387    Recent Labs    03/27/23 1010  NA 134*  K 3.6  CL 104  CO2 22  GLUCOSE 116*  BUN 17  CREATININE 1.95*  CALCIUM 8.6*     Intake/Output Summary (Last 24 hours) at 03/29/2023 0802 Last data filed at 03/29/2023 0527 Gross per 24 hour  Intake 377 ml  Output 1975 ml  Net -1598 ml        Physical Exam: Vital Signs Blood pressure (!) 153/95, pulse 80, temperature 97.8 F (36.6 C), temperature source Oral, resp. rate 18, height 5\' 11"  (1.803 m), weight 80.7 kg, SpO2 98%.   General: No acute distress.  Sitting up in bed. Mood and affect are appropriate Heart: Regular rate and rhythm, no obvious murmurs, rubs, or gallops. Lungs: Clear to auscultation, breathing unlabored, no rales or wheezes Abdomen: Positive bowel sounds, soft nontender to palpation, nondistended Extremities: No clubbing, cyanosis, or edema Skin: No evidence of breakdown, no evidence of rash Neuro: Awake, alert, oriented to self and place.  Not time.  Approximately equal strength in bilateral upper and lower extremities.  Some mild left inattention.   Prior exams: Neuro:  pt is fairly alert, oriented. Fair insight and awareness. Speech still dysarthric. Normal language. CN notable for left CN VII, left inattetion. Shoulders both limited by ortho. RUE otherwise is 4 /5 and LUE is  4/5. RLE is 4-/5. LLE is 4-5 prox to distal. .  Musculoskeletal: knees non tender without effusion . No pain with ROM Left ankle, non tender no evidence of effusion,  kyphotic posture    Assessment/Plan: 1. Functional deficits which require 3+ hours per day of interdisciplinary therapy in a  comprehensive inpatient rehab setting. Physiatrist is providing close team supervision and 24 hour management of active medical problems listed below. Physiatrist and rehab team continue to assess barriers to discharge/monitor patient progress toward functional and medical goals  Care Tool:  Bathing    Body parts bathed by patient: Right arm, Left arm, Face, Front perineal area, Chest, Abdomen   Body parts bathed by helper: Right upper leg, Left upper leg, Buttocks, Left lower leg, Right lower leg     Bathing assist Assist Level: Maximal Assistance - Patient 24 - 49%     Upper Body Dressing/Undressing Upper body dressing   What is the patient wearing?: Pull over shirt    Upper body assist Assist Level: Maximal Assistance - Patient 25 - 49%    Lower Body Dressing/Undressing Lower body dressing      What is the patient wearing?: Pants     Lower body assist Assist for lower body dressing: Maximal Assistance - Patient 25 - 49%     Toileting Toileting    Toileting assist Assist for toileting: Contact Guard/Touching assist     Transfers Chair/bed transfer  Transfers assist     Chair/bed transfer assist  level: Moderate Assistance - Patient 50 - 74%     Locomotion Ambulation   Ambulation assist   Ambulation activity did not occur: Safety/medical concerns (limited 2/2 high ankle pain)  Assist level: Minimal Assistance - Patient > 75% Assistive device: Walker-rolling [place] distance: 60   Walk 10 feet activity   Assist  Walk 10 feet activity did not occur: Safety/medical concerns (limited 2/2 high ankle pain)  Assist level: Minimal Assistance - Patient > 75% Assistive device: Walker-rolling   Walk 50 feet activity   Assist Walk 50 feet with 2 turns activity did not occur: Safety/medical concerns (limited 2/2 high ankle pain)  Assist level: Minimal Assistance - Patient > 75% Assistive device: Walker-rolling    Walk 150 feet activity   Assist Walk 150  feet activity did not occur: Safety/medical concerns (limited 2/2 high ankle pain)         Walk 10 feet on uneven surface  activity   Assist Walk 10 feet on uneven surfaces activity did not occur: Safety/medical concerns (limited 2/2 high ankle pain)         Wheelchair     Assist Is the patient using a wheelchair?: Yes Type of Wheelchair: Manual    Wheelchair assist level: Dependent - Patient 0% [place] wheelchair distance: 380ft    Wheelchair 50 feet with 2 turns activity    Assist        Assist Level: Dependent - Patient 0%   Wheelchair 150 feet activity     Assist      Assist Level: Dependent - Patient 0%   Blood pressure (!) 153/95, pulse 80, temperature 97.8 F (36.6 C), temperature source Oral, resp. rate 18, height 5\' 11"  (1.803 m), weight 80.7 kg, SpO2 98%.  Medical Problem List and Plan: 1. 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  Antithrombotics: -DVT/anticoagulation:  Pharmaceutical: Eliquis             -antiplatelet therapy: N/A 3. Pain Management:             No consistent pain c/os             - xray of left ankle with arthritic changes tib-talar joint and soft tissue swelling of heel and plantar foot             -continue voltaren gel to right chest wall and ankle TID.   -needs appropriate shoewear when up with therapy Ordered  CAM walker boot but ambulated without it in therapy no c/o yesterday   Bilateral knee pain OA add voltaren gel   -2-16: No complaints of pain over this weekend  4. Mood/Behavior/Sleep: Remeron 15 mg nightly.  Xanax 0.25 mg nightly as needed             -antipsychotic agents: N/A 5. Neuropsych/cognition: This patient  is capable of making decisions on his own behalf. 6. Skin/Wound Care: Routine skin checks 7. Fluids/Electrolytes/Nutrition: Routine and analysis follow-up chemistries  -2-15: Approximately 1 L of 1 half-normal saline given overnight for orthostasis; BUN significantly improved, creatinine stable.  No further orthostasis today.  Has  TED hose ordered.  -2-16: Patient wishes trial of encouraging p.o. fluids; recommended 6 to 8 to 60 cc cups today.  8.  Newly diagnosed A-fib with RVR.  Continue Eliquis as well as Coreg 12.5 mg twice daily.  Plan outpatient follow-up consider DCCV  -remains in a fib, rate controlled  -2-15: IV fluids overnight as above, rate looks well-controlled.  DC IVF.  9.  CAD/CABG.  Imdur 15 mg daily. 10.  Hypertension.  Monitor with increased mobility.  Hydralazine and Norvasc currently on hold due to soft blood pressure and resume as needed   Vitals:   03/28/23 1928 03/29/23 0403  BP: (!) 168/75 (!) 153/95  Pulse: 85 80  Resp: 18 18  Temp: 98.2 F (36.8 C) 97.8 F (36.6 C)  SpO2: 97% 98%  Increased coreg to 25mg  BID , improved  -2/15: Some mild hypertension, given orthostasis yesterday will add teds  and monitor for further medication adjustment tomorrow 2/16: Blood pressure increasing today; we will get another set of orthostats, resume Norvasc at 5 mg (per allergies, cannot dose titrate beyond this, may benefit from switch to ACE) 2/17 first dose amlodipine today  11.  History of prostate cancer with recent E. coli UTI/prostatitis.  Complete course of Augmentin 12.  CKD stage IV.  Baseline creatinine 2.0-2.2.  Follow-up chemistries on Monday    Latest Ref Rng & Units 03/27/2023   10:10 AM 03/22/2023    5:22 AM 03/21/2023   12:49 PM  BMP  Glucose 70 - 99 mg/dL 096  95  045   BUN 8 - 23 mg/dL 17  29  31    Creatinine 0.61 - 1.24 mg/dL 4.09  8.11  9.14   Sodium 135 - 145 mmol/L 134  136  132   Potassium 3.5 - 5.1 mmol/L 3.6  4.1  3.9   Chloride 98 - 111 mmol/L 104   100  103   CO2 22 - 32 mmol/L 22  22  19    Calcium 8.9 - 10.3 mg/dL 8.6  9.2  8.5    Creat at baseline, fluid intake poor BUN elevated over baseline   2-15: Repeat CMP today--BUN much improved, creatinine stable.  Some mild hyponatremia but with half-normal saline so will likely self resolve.  13.  GERD/abdominal pain/nausea and vomiting.  Seen by GI recommended empiric PPI daily, Carafate. - No plans for endoscopic intervention at this time             No recurrence 2/14    14.  Hyperlipidemia.  Lipitor 15.  History of mesenteric artery stenosis.  Follow-up outpatient 16.  Anemia of chronic disease.  Continue iron supplement.  Occult blood negative.   Follow-up CBC stable      Latest Ref Rng & Units 03/27/2023   10:10 AM 03/22/2023    5:22 AM 03/21/2023   12:49 PM  CBC  WBC 4.0 - 10.5 K/uL 7.8  7.5  9.3   Hemoglobin 13.0 - 17.0 g/dL 9.5  8.9  8.7   Hematocrit 39.0 - 52.0 % 30.0  27.3  26.1   Platelets 150 - 400 K/uL 387  321  304     - 2/15: Repeat CBC today due to generalized feelings of illness, orthostatic hypotension, and large black bowel movement overnight.--Hemoglobin stable 9.5.     - 2/16: Multiple bowel movements yesterday, incontinent.  Reduce iron tablets to every other day to improve GI upset  LOS: 10 days A FACE TO FACE EVALUATION WAS PERFORMED  Greig Castilla  E Sharalee Witman 03/29/2023, 8:02 AM

## 2023-03-29 NOTE — Progress Notes (Signed)
 Occupational Therapy Weekly Progress Note  Patient Details  Name: Brandon Harris MRN: [id number] Date of Birth: Apr 24, 1928  Beginning of progress report period: March 20, 2023 End of progress report period: March 29, 2023  Today's Date: 03/29/2023 OT Individual Time: 6578-4696 OT Individual Time Calculation (min): 45 min    Patient has met 4 of 4 short term goals.  Pt has progressed toward OT goals and is performing transfers with CGA overall with RW, and inconsistent with LB ADL at times performing with Supervision - MIN A when fatigued. Pt is to have supervision from family at home at time of DC and has a good support system.   Patient continues to demonstrate the following deficits: muscle weakness, decreased cardiorespiratoy endurance, decreased motor planning, and decreased standing balance, decreased postural control, and decreased balance strategies and therefore will continue to benefit from skilled OT intervention to enhance overall performance with BADL and Reduce care partner burden.  Patient progressing toward long term goals..  Continue plan of care.  OT Short Term Goals Week 1:  OT Short Term Goal 1 (Week 1): Pt will complete sit<>stands during BADLs with mod A x1 consistly using LRAD OT Short Term Goal 1 - Progress (Week 1): Met OT Short Term Goal 2 (Week 1): Pt will complete toilet transfers mod A using LRAD OT Short Term Goal 2 - Progress (Week 1): Met OT Short Term Goal 3 (Week 1): Pt will tolerate standing >1 minute during functional tasks with min A using LRAD OT Short Term Goal 3 - Progress (Week 1): Met OT Short Term Goal 4 (Week 1): Pt will complete grooming/hygiene tasks with min A OT Short Term Goal 4 - Progress (Week 1): Met Week 2:  OT Short Term Goal 1 (Week 2): STG = LTG 2/2 LOS  Skilled Therapeutic Interventions/Progress Updates:    Focus of session initially in room with pt reporting no pain at time of session, bed mobiltiy Supervision, requesting  assist to don shoes today 2/2 "not feeling my best." Walking with RW >sink for oral hygiene with Supervision, ambulated CGA throughout hall  to ortho gym and wheelchair follow. In gym, performing standing task at [facility] for 3:27 with minimal fatigue, adaptations made for B shoulder limitations. Focused on form and technique for functional transfers with RW, performing 4/4 with CGA from various surfaces. Back in room, set up with daughter present all needs met call bell in reach.   Therapy Documentation Precautions:  Precautions Precautions: Fall Restrictions Weight Bearing Restrictions Per Provider Order: No    Therapy/Group: Individual Therapy  Erasmo Score 03/29/2023, 7:28 AM

## 2023-03-29 NOTE — Progress Notes (Signed)
 [name], male   DOB: [birth year], 89 y.o.   MRN: [id number]  Daughter concerned regarding bad weather coming asked to move up discharge to Wed instead of Thurs. Team and MD in agreement with. Moved DC to [employer]

## 2023-03-30 ENCOUNTER — Other Ambulatory Visit (HOSPITAL_COMMUNITY): Payer: Self-pay

## 2023-03-30 DIAGNOSIS — I63511 Cerebral infarction due to unspecified occlusion or stenosis of right middle cerebral artery: Secondary | ICD-10-CM | POA: Diagnosis not present

## 2023-03-30 DIAGNOSIS — I10 Essential (primary) hypertension: Secondary | ICD-10-CM | POA: Diagnosis not present

## 2023-03-30 DIAGNOSIS — I4891 Unspecified atrial fibrillation: Secondary | ICD-10-CM | POA: Diagnosis not present

## 2023-03-30 DIAGNOSIS — M19072 Primary osteoarthritis, left ankle and foot: Secondary | ICD-10-CM | POA: Diagnosis not present

## 2023-03-30 MED ORDER — SUCRALFATE 1 G PO TABS
1.0000 g | ORAL_TABLET | Freq: Three times a day (TID) | ORAL | 0 refills | Status: DC
  Filled 2023-03-30: qty 120, 30d supply, fill #0

## 2023-03-30 MED ORDER — CARVEDILOL 25 MG PO TABS
25.0000 mg | ORAL_TABLET | Freq: Two times a day (BID) | ORAL | 0 refills | Status: DC
  Filled 2023-03-30: qty 60, 30d supply, fill #0

## 2023-03-30 MED ORDER — ALPRAZOLAM 0.25 MG PO TABS
0.2500 mg | ORAL_TABLET | Freq: Every evening | ORAL | 0 refills | Status: AC | PRN
Start: 2023-03-30 — End: ?
  Filled 2023-03-30: qty 30, 30d supply, fill #0

## 2023-03-30 MED ORDER — ATORVASTATIN CALCIUM 80 MG PO TABS
80.0000 mg | ORAL_TABLET | Freq: Every evening | ORAL | 0 refills | Status: AC
  Filled 2023-03-30 – 2023-04-20 (×2): qty 30, 30d supply, fill #0

## 2023-03-30 MED ORDER — APIXABAN 2.5 MG PO TABS
2.5000 mg | ORAL_TABLET | Freq: Two times a day (BID) | ORAL | 0 refills | Status: DC
  Filled 2023-03-30: qty 60, 30d supply, fill #0

## 2023-03-30 MED ORDER — DICLOFENAC SODIUM 1 % EX GEL
4.0000 g | Freq: Four times a day (QID) | CUTANEOUS | 0 refills | Status: DC
  Filled 2023-03-30: qty 100, 25d supply, fill #0

## 2023-03-30 MED ORDER — ISOSORBIDE MONONITRATE ER 30 MG PO TB24
15.0000 mg | ORAL_TABLET | Freq: Every day | ORAL | 0 refills | Status: DC
  Filled 2023-03-30: qty 30, 60d supply, fill #0

## 2023-03-30 MED ORDER — PANTOPRAZOLE SODIUM 40 MG PO TBEC
40.0000 mg | DELAYED_RELEASE_TABLET | Freq: Every morning | ORAL | 0 refills | Status: DC
  Filled 2023-03-30: qty 30, 30d supply, fill #0

## 2023-03-30 MED ORDER — MIRTAZAPINE 15 MG PO TABS
15.0000 mg | ORAL_TABLET | Freq: Every day | ORAL | 0 refills | Status: AC
  Filled 2023-03-30 – 2023-04-20 (×2): qty 30, 30d supply, fill #0

## 2023-03-30 MED ORDER — AMLODIPINE BESYLATE 5 MG PO TABS
5.0000 mg | ORAL_TABLET | Freq: Every day | ORAL | 0 refills | Status: DC
  Filled 2023-03-30: qty 30, 30d supply, fill #0

## 2023-03-30 NOTE — Progress Notes (Signed)
 Speech Language Pathology Discharge Summary  Patient Details  Name: Brandon Harris MRN: [id number] Date of Birth: [birth year]  Date of Discharge from SLP service:March 30, 2023  Today's Date: 03/30/2023 SLP Individual Time: 1004-1105 SLP Individual Time Calculation (min): 61 min   Skilled Therapeutic Interventions: Patient was seen in am to address cognitive re- training. Pt was alert and seen at bedside. Dtr present and participating intermittently throughout session. SLP discussed safety awareness and problem solving through challenging pt to identify safe vs unsafe scenarios in the home envionment. Pt accurately distinguished safe vs unsafe situations with 75% acc with sup A. Pt challenged in short term recall. Given 4 household objects placed on a tray and 15 seconds to visualize object pt was given a distracted delay of ~2.5 minutes before recalling information with 77% acc with sup A. He completed a sequencing task where he was challenged to sequence 4 steps of daily living with pt completing task with 100% acc and sup A. At conclusion of session, pt was left in bed with call button within reach, bed alarm active, and dtr present. SLP to continue POC.   Patient has met 3 of 3 long term goals.  Patient to discharge at overall Supervision level.  Reasons goals not met:     Clinical Impression/Discharge Summary: Pt has made excellent gains and has met 3 of 3 LTG's this admission due to improve problem solving, memory, and attention. Pt is currently an overall sup to min A for cognitive tasks. Pt/ family education complete and pt will discharge home with 24 hour supervision from family, friends, etc. No follow up skilled speech therapy warranted at this time however; intermittent supervision would be recommended.  Care Partner:  Caregiver Able to Provide Assistance: Yes  Type of Caregiver Assistance: Cognitive  Recommendation:  None (intermittent supervision)      Equipment: none    Reasons for discharge: Discharged from hospital;Treatment goals met   Patient/Family Agrees with Progress Made and Goals Achieved: Yes    Renaee Munda 03/30/2023, 12:56 PM

## 2023-03-30 NOTE — Progress Notes (Deleted)
 Inpatient Rehabilitation Admission Medication Review by a Pharmacist  A complete drug regimen review was completed for this patient to identify any potential clinically significant medication issues.  High Risk Drug Classes Is patient taking? Indication by Medication  Antipsychotic No   Anticoagulant Yes Apixaban - atrial fibrillation and CVA prophylaxis  Antibiotic No   Opioid No   Antiplatelet No   Hypoglycemics/insulin No   Vasoactive Medication Yes Carvedilol, - atrial fibrillation, hypertension Amlodipine, Imdur - CAD, hypertension  Chemotherapy No   Other Yes Atorvastatin - hyperlipidemia Diclofenac gel - topical pain relief Ferrous sulfate - supplement Mirtazapine - mood, sleep Pantoprazole, Sucralfate - GERD/abdominal pain, nausea NTG SL- prn chest pain/angina Acetaminophen - mild pain or temp > 99.32F Alprazolam - anxiety, sleep     Type of Medication Issue Identified Description of Issue Recommendation(s)  Drug Interaction(s) (clinically significant)     Duplicate Therapy     Allergy     No Medication Administration End Date     Incorrect Dose     Additional Drug Therapy Needed     Significant med changes from prior encounter (inform family/care partners about these prior to discharge). New Apixaban, sucralfate, diclofenac gel. Imdur dose decreased. Coreg dose increased  STOP: aspirin,  lasix, hydralazine.   Augmentin- completed therapy Communicate medication changes with patient/family prior to discharge. Medication instructions are indicated on discharge AVS.    Other       Clinically significant medication issues were identified that warrant physician communication and completion of prescribed/recommended actions by midnight of the next day:  No  Pharmacist comments:  - Dr. Riley Kill reviewed antibiotics. Metronidazole discontinued. To complete Augmentin course.  Time spent performing this drug regimen review (minutes): 20    Thank you for allowing  pharmacy to be a part of this patient's care.   Arman Filter, [place] 03/30/2023 9:44 AM

## 2023-03-30 NOTE — Progress Notes (Addendum)
 Inpatient Rehabilitation Care Coordinator Discharge Note   Patient Details  Name: Brandon Harris MRN: [id number] Date of Birth: November 05, 1928   Discharge location: HOME WITH DAUGHTER WHO CAN PROVIDE 24/7 SUPERVISION AT DISCHARGE  Length of Stay: 11 DAYS  Discharge activity level: CGA-MIN LEVEL  Home/community participation: ACTIVE  Patient response OZ:DGUYQI Literacy - How often do you need to have someone help you when you read instructions, pamphlets, or other written material from your doctor or pharmacy?: Never  Patient response HK:VQQVZD Isolation - How often do you feel lonely or isolated from those around you?: Never  Services provided included: MD, RD, PT, OT, SLP, RN, CM, Pharmacy, SW  Financial Services:  Field seismologist Utilized: Private Insurance [employer]  Choices offered to/list presented to: PT AND DAUGHTER  Follow-up services arranged:  Home Health, Patient/Family has no preference for HH/DME agencies Home Health Agency: [facility] PT & OT     HAS ALL NEEDED EQUIPMENT FROM PAST ADMISSIONS    Patient response to transportation need: Is the patient able to respond to transportation needs?: Yes In the past 12 months, has lack of transportation kept you from medical appointments or from getting medications?: No In the past 12 months, has lack of transportation kept you from meetings, work, or from getting things needed for daily living?: No   Patient/Family verbalized understanding of follow-up arrangements:  Yes  Individual responsible for coordination of the follow-up plan: JANICE-DAUGHTER [phone]  Confirmed correct DME delivered: Lucy Chris 03/30/2023    Comments (or additional information):DAUGHTER WAS HERE DAILY AND COMFORTABLE WITH DAD;S CARE. ASKED TO GO HOME SOONER DUE TO IMPENDING WEATHER  Summary of Stay    Date/Time Discharge Planning CSW  03/24/23 [phone] Home with daughter with whom he lives with-she is here daily and is  aware may require physical care RGD       Julieth Tugman, Lemar Livings

## 2023-03-30 NOTE — Progress Notes (Signed)
 Occupational Therapy Discharge Summary  Patient Details  Name: Brandon Harris MRN: [id number] Date of Birth: January 20, 1929  Date of Discharge from OT service:March 30, 2023  Today's Date: 03/30/2023 OT Individual Time: 4098-1191 OT Individual Time Calculation (min): 70 min    Patient has met 11 of 11 long term goals due to improved activity tolerance, improved balance, postural control, ability to compensate for deficits, functional use of  LEFT upper and LEFT lower extremity, improved attention, improved awareness, and improved coordination.  Patient to discharge at overall  CGA  level.  Patient's care partner is independent to provide the necessary physical and cognitive assistance at discharge.    Reasons goals not met: All goals met  Recommendation:  Patient will benefit from ongoing skilled OT services in home health setting to continue to advance functional skills in the area of BADL and Reduce care partner burden.  Equipment: No equipment provided; Pt's family owns TTB, shower seat, and RW  Reasons for discharge: treatment goals met and discharge from hospital  Patient/family agrees with progress made and goals achieved: Yes  OT Discharge Skilled Therapeutic Interventions/Progress Updates:  Pt received reclined in bed with DTR present in room. Pt presenting to be in good spirits receptive to skilled OT session reporting 0/10 pain- OT offering intermittent rest breaks, repositioning, and therapeutic support to optimize participation in therapy session. Focus this session on BADL retraining and family education to increase Pt's independence and safety at d/c and decrease overall caregiver bourdon. Pt completed BADLs seated EOB at levels listed below this session with min verbal cues provided for safety and RW use during BADLs. Education provided on using long-handled sponge for LB bathing and long-handled shoe horn for donning footwear with Pt demonstrating teach back as evidence of  learning. Engaged Pt in completing grooming/hygiene tasks in standing position at sink using RW for balance with Pt able to complete oral care, brush hair, and apply lotion to his face with CGA. Remainder of therapy session focused on Pt and family education. Education provided on energy conservation, fall prevention, CVA recovery process, follow-up HHOT recommendation, DME recommendations for grab bars and shower seat, and simple home modifications to increase Pt's safety. Engaged Pt's DTR in hands-on training completing walk-in shower transfers, toilet transfers, and household distance functional mobility with Pt's caregiver receptive to all education provided and demonstration appropriate insight into Pt's deficits. Pt and Pt's caregiver reporting all questions were answered at end of session. Pt was left resting in wc with call bell in reach, DTR present in room, and all needs met.   Precautions/Restrictions  Precautions Precautions: Fall Restrictions Weight Bearing Restrictions Per Provider Order: No Pain Pain Assessment Pain Scale: 0-10 Pain Score: 0-No pain ADL ADL Equipment Provided: Long-handled sponge, Long-handled shoe horn Eating: Independent Where Assessed-Eating: Chair Grooming: Supervision/safety Where Assessed-Grooming: Sitting at sink, Wheelchair Upper Body Bathing: Supervision/safety Where Assessed-Upper Body Bathing: Edge of bed Lower Body Bathing: Minimal assistance Where Assessed-Lower Body Bathing: Edge of bed Upper Body Dressing: Supervision/safety Where Assessed-Upper Body Dressing: Edge of bed Lower Body Dressing: Minimal assistance Where Assessed-Lower Body Dressing: Edge of bed Toileting: Contact guard Where Assessed-Toileting: Teacher, adult education: Furniture conservator/restorer Method: Stand pivot, Proofreader: Engineer, technical sales: International aid/development worker Method: Ship broker: Psychiatric nurse with back Film/video editor: Not assessed Vision Baseline Vision/History: 1 Wears glasses Patient Visual Report: No change from baseline Vision Assessment?: Yes Eye Alignment: Within Functional Limits Ocular Range of Motion: Within  Functional Limits (slowed eye movements during vertical tracking) Alignment/Gaze Preference: Within Defined Limits Tracking/Visual Pursuits: Decreased smoothness of vertical tracking (slowed eye movements during vertical tracking) Saccades: Within functional limits Convergence: Within functional limits (R eye slow to converge, however no double vision reported) Visual Fields: No apparent deficits Perception  Perception: Within Functional Limits Praxis Praxis: WFL Cognition Cognition Overall Cognitive Status: Impaired/Different from baseline Arousal/Alertness: Awake/alert Orientation Level: Person;Place;Situation Person: Oriented Place: Oriented Situation: Oriented Memory: Impaired Memory Impairment: Decreased recall of new information Attention: Selective;Alternating Selective Attention: Appears intact Awareness: Appears intact Problem Solving: Appears intact Safety/Judgment: Impaired (Pt is occasionally overly confident during functional tasks requiring verbal cues for safety) Brief Interview for Mental Status (BIMS) Repetition of Three Words (First Attempt): 3 Temporal Orientation: Year: Correct Temporal Orientation: Month: Accurate within 5 days Temporal Orientation: Day: Correct Recall: "Sock": Yes, no cue required Recall: "Blue": Yes, after cueing ("a color") Recall: "Bed": No, could not recall BIMS Summary Score: 12 Sensation Sensation Light Touch: Appears Intact Coordination Gross Motor Movements are Fluid and Coordinated: No Fine Motor Movements are Fluid and Coordinated: Yes Coordination and Movement Description: decreased 2/2 B LE weakeness and fatigue, improvement from inital evaluaiton Finger Nose Finger Test:  [facility] Motor  Motor Motor - Discharge Observations: Improved balance and strength since inital eval; mild instabilty d/t forward flexed trunk, B LE weakness, and mild L lateral and posterior lean in standing Mobility  Bed Mobility Bed Mobility: Rolling Right;Rolling Left;Supine to Sit;Sit to Supine;Sitting - Scoot to [place] of Bed Rolling Right: Supervision/verbal cueing Rolling Left: Supervision/Verbal cueing Supine to Sit: Supervision/Verbal cueing Sitting - Scoot to Edge of Bed: Supervision/Verbal cueing Sit to Supine: Contact Guard/Touching assist Transfers Sit to Stand: Contact Guard/Touching assist Stand to Sit: Contact Guard/Touching assist  Trunk/Postural Assessment  Cervical Assessment Cervical Assessment: Exceptions to [facility] (forward head) Thoracic Assessment Thoracic Assessment: Exceptions to [facility] (kyphosis) Lumbar Assessment Lumbar Assessment: Exceptions to [facility] (posterior pelvic tilt) Postural Control Postural Control: Deficits on evaluation (improvement from inital evaluation) Trunk Control: decreased Protective Responses: delayed  Balance Balance Balance Assessed: Yes Static Sitting Balance Static Sitting - Balance Support: Feet unsupported Static Sitting - Level of Assistance: 6: Modified independent (Device/Increase time) Dynamic Sitting Balance Dynamic Sitting - Balance Support: Feet supported Dynamic Sitting - Level of Assistance: 6: Modified independent (Device/Increase time) Static Standing Balance Static Standing - Balance Support: During functional activity;Bilateral upper extremity supported Static Standing - Level of Assistance: 5: Stand by assistance Dynamic Standing Balance Dynamic Standing - Balance Support: During functional activity;Bilateral upper extremity supported Dynamic Standing - Level of Assistance: 5: Stand by assistance (CGA) Extremity/Trunk Assessment RUE Assessment RUE Assessment: Exceptions to [facility] General Strength Comments: Limited  shoulder flexion d/t previous rotator cuff injury, 4/5 grossly for grip and elbow flexion LUE Assessment LUE Assessment: Exceptions to [facility] General Strength Comments: Limited shoulder ROM and strength d/t previous rotator cuff injury, strength 4/5 grossly   Clide Deutscher 03/30/2023, 9:24 AM

## 2023-03-30 NOTE — Progress Notes (Addendum)
 Inpatient Rehabilitation Discharge Medication Review by a Pharmacist  A complete drug regimen review was completed for this patient to identify any potential clinically significant medication issues.  High Risk Drug Classes Is patient taking? Indication by Medication  Antipsychotic No   Anticoagulant Yes Apixaban - atrial fibrillation and CVA prophylaxis  Antibiotic No   Opioid No   Antiplatelet No   Hypoglycemics/insulin No   Vasoactive Medication Yes Carvedilol, - atrial fibrillation, hypertension Amlodipine, Imdur - CAD, hypertension  Chemotherapy No   Other Yes Atorvastatin - hyperlipidemia Diclofenac gel - topical pain relief Ferrous sulfate - supplement Mirtazapine - mood, sleep Pantoprazole, Sucralfate - GERD/abdominal pain, nausea NTG SL- prn chest pain/angina Acetaminophen - mild pain or temp > 99.40F Alprazolam - anxiety, sleep     Type of Medication Issue Identified Description of Issue Recommendation(s)  Drug Interaction(s) (clinically significant)     Duplicate Therapy     Allergy     No Medication Administration End Date     Incorrect Dose     Additional Drug Therapy Needed     Significant med changes from prior encounter (inform family/care partners about these prior to discharge). New Apixaban, sucralfate, diclofenac gel. Imdur dose decreased. Coreg dose increased  STOP: aspirin,  lasix, hydralazine.   Augmentin- completed therapy Communicate medication changes with patient/family prior to discharge. Medication instructions are indicated on discharge AVS.    Other       Clinically significant medication issues were identified that warrant physician communication and completion of prescribed/recommended actions by midnight of the next day:  No  Pharmacist comments:   Time spent performing this drug regimen review (minutes): 20    Thank you for allowing pharmacy to be a part of this patient's care.   Arman Filter, [place] 03/30/2023 9:52  AM

## 2023-03-30 NOTE — Plan of Care (Signed)
  Problem: RH Balance Goal: LTG Patient will maintain dynamic standing balance (PT) Description: LTG:  Patient will maintain dynamic standing balance with assistance during mobility activities (PT) Outcome: Completed/Met Flowsheets (Taken 03/30/2023 1710) LTG: Pt will maintain dynamic standing balance during mobility activities with:: Supervision/Verbal cueing   Problem: RH Bed Mobility Goal: LTG Patient will perform bed mobility with assist (PT) Description: LTG: Patient will perform bed mobility with assistance, with/without cues (PT). Outcome: Completed/Met Flowsheets (Taken 03/30/2023 1710) LTG: Pt will perform bed mobility with assistance level of: Independent with assistive device    Problem: RH Bed to Chair Transfers Goal: LTG Patient will perform bed/chair transfers w/assist (PT) Description: LTG: Patient will perform bed to chair transfers with assistance (PT). Outcome: Completed/Met Flowsheets (Taken 03/20/2023 1411 by Dionne Milo, PT) LTG: Pt will perform Bed to Chair Transfers with assistance level: Contact Guard/Touching assist   Problem: RH Car Transfers Goal: LTG Patient will perform car transfers with assist (PT) Description: LTG: Patient will perform car transfers with assistance (PT). Outcome: Completed/Met Flowsheets (Taken 03/20/2023 1411 by Dionne Milo, PT) LTG: Pt will perform car transfers with assist:: Contact Guard/Touching assist   Problem: RH Ambulation Goal: LTG Patient will ambulate in home environment (PT) Description: LTG: Patient will ambulate in home environment, # of feet with assistance (PT). Outcome: Completed/Met Flowsheets (Taken 03/20/2023 1411 by Dionne Milo, PT) LTG: Pt will ambulate in home environ  assist needed:: Contact Guard/Touching assist LTG: Ambulation distance in home environment: 164ft

## 2023-03-30 NOTE — Progress Notes (Signed)
 PROGRESS NOTE   Subjective/Complaints:  Pt was driving locally PTA, discussed no driving until cleared by MD  ROS: Patient denies CP, SOB, N/V/D + orthostasis  Objective:   No results found.  Recent Labs    03/27/23 1010  WBC 7.8  HGB 9.5*  HCT 30.0*  PLT 387    Recent Labs    03/27/23 1010  NA 134*  K 3.6  CL 104  CO2 22  GLUCOSE 116*  BUN 17  CREATININE 1.95*  CALCIUM 8.6*     Intake/Output Summary (Last 24 hours) at 03/30/2023 0754 Last data filed at 03/30/2023 0535 Gross per 24 hour  Intake 240 ml  Output 1575 ml  Net -1335 ml        Physical Exam: Vital Signs Blood pressure 134/66, pulse 74, temperature 97.7 F (36.5 C), resp. rate 16, height 5\' 11"  (1.803 m), weight 80.7 kg, SpO2 99%.   General: No acute distress.  Sitting up in bed. Mood and affect are appropriate Heart: Regular rate and rhythm, no obvious murmurs, rubs, or gallops. Lungs: Clear to auscultation, breathing unlabored, no rales or wheezes Abdomen: Positive bowel sounds, soft nontender to palpation, nondistended Extremities: No clubbing, cyanosis, or edema Skin: No evidence of breakdown, no evidence of rash Neuro: Awake, alert, oriented to self and place.  Not time.  Approximately equal strength in bilateral upper and lower extremities.  Some mild left inattention.   Prior exams: Neuro:  pt is fairly alert, oriented. Fair insight and awareness. Speech still dysarthric. Normal language. CN notable for left CN VII, left inattetion. Shoulders both limited by ortho. RUE otherwise is 4 /5 and LUE is  4/5. RLE is 4-/5. LLE is 4-5 prox to distal. .  Musculoskeletal: knees non tender without effusion . No pain with ROM Left ankle, non tender no evidence of effusion,  kyphotic posture    Assessment/Plan: 1. Functional deficits due to CVA Stable for D/C today F/u PCP in 1-2 weeks F/u PM&R prn Neuro f/u 1-2 mo- no driving until  cleared  See D/C summary See D/C instructions   Care Tool:  Bathing    Body parts bathed by patient: Right arm, Left arm, Face, Front perineal area, Chest, Abdomen   Body parts bathed by helper: Right upper leg, Left upper leg, Buttocks, Left lower leg, Right lower leg     Bathing assist Assist Level: Maximal Assistance - Patient 24 - 49%     Upper Body Dressing/Undressing Upper body dressing   What is the patient wearing?: Pull over shirt    Upper body assist Assist Level: Maximal Assistance - Patient 25 - 49%    Lower Body Dressing/Undressing Lower body dressing      What is the patient wearing?: Pants     Lower body assist Assist for lower body dressing: Maximal Assistance - Patient 25 - 49%     Toileting Toileting    Toileting assist Assist for toileting: Contact Guard/Touching assist     Transfers Chair/bed transfer  Transfers assist     Chair/bed transfer assist level: Moderate Assistance - Patient 50 - 74%     Locomotion Ambulation   Ambulation assist   Ambulation  activity did not occur: Safety/medical concerns (limited 2/2 high ankle pain)  Assist level: Minimal Assistance - Patient > 75% Assistive device: Walker-rolling [place] distance: 60   Walk 10 feet activity   Assist  Walk 10 feet activity did not occur: Safety/medical concerns (limited 2/2 high ankle pain)  Assist level: Minimal Assistance - Patient > 75% Assistive device: Walker-rolling   Walk 50 feet activity   Assist Walk 50 feet with 2 turns activity did not occur: Safety/medical concerns (limited 2/2 high ankle pain)  Assist level: Minimal Assistance - Patient > 75% Assistive device: Walker-rolling    Walk 150 feet activity   Assist Walk 150 feet activity did not occur: Safety/medical concerns (limited 2/2 high ankle pain)         Walk 10 feet on uneven surface  activity   Assist Walk 10 feet on uneven surfaces activity did not occur: Safety/medical concerns  (limited 2/2 high ankle pain)         Wheelchair     Assist Is the patient using a wheelchair?: Yes Type of Wheelchair: Manual    Wheelchair assist level: Dependent - Patient 0% [place] wheelchair distance: 376ft    Wheelchair 50 feet with 2 turns activity    Assist        Assist Level: Dependent - Patient 0%   Wheelchair 150 feet activity     Assist      Assist Level: Dependent - Patient 0%   Blood pressure 134/66, pulse 74, temperature 97.7 F (36.5 C), resp. rate 16, height 5\' 11"  (1.803 m), weight 80.7 kg, SpO2 99%.  Medical Problem List and Plan: 1. 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  Antithrombotics: -DVT/anticoagulation:  Pharmaceutical: Eliquis             -antiplatelet therapy: N/A 3. Pain Management:             No consistent pain c/os             - xray of left ankle with arthritic changes tib-talar joint and soft tissue swelling of heel and plantar foot             -continue voltaren gel to right chest wall and ankle TID.   -needs appropriate shoewear when up with therapy Ordered  CAM walker boot but ambulated without it in therapy no c/o yesterday   Bilateral knee pain OA add voltaren gel   -2-16: No complaints of pain over this weekend  4. Mood/Behavior/Sleep: Remeron 15 mg nightly.  Xanax 0.25 mg nightly as needed             -antipsychotic agents: N/A 5. Neuropsych/cognition: This patient is capable of making decisions on his own behalf. 6. Skin/Wound Care: Routine skin checks 7. Fluids/Electrolytes/Nutrition: Routine and analysis follow-up chemistries  -2-15: Approximately 1 L of 1 half-normal saline given overnight for orthostasis; BUN  significantly improved, creatinine stable.  No further orthostasis today.  Has TED hose ordered.  -2-16: Patient wishes trial of encouraging p.o. fluids; recommended 6 to 8 to 60 cc cups today.  8.  Newly  diagnosed A-fib with RVR.  Continue Eliquis as well as Coreg 12.5 mg twice daily.  Plan outpatient follow-up consider DCCV  -remains in a fib, rate controlled  -2-15: IV fluids overnight as above, rate looks well-controlled.  DC IVF.  9.  CAD/CABG.  Imdur 15 mg daily. 10.  Hypertension.  Monitor with increased mobility.  Hydralazine and Norvasc currently on hold due to soft blood pressure and resume as needed   Vitals:   03/29/23 1946 03/30/23 0427  BP: (!) 149/69 134/66  Pulse: 75 74  Resp: 16 16  Temp: 97.6 F (36.4 C) 97.7 F (36.5 C)  SpO2: 98% 99%  Increased coreg to 25mg  BID , improved  -2/15: Some mild hypertension, given orthostasis yesterday will add teds  and monitor for further medication adjustment tomorrow 2/16: Blood pressure increasing today; we will get another set of orthostats, resume Norvasc at 5 mg (per allergies, cannot dose titrate beyond this, may benefit from switch to ACE) 2/17 first dose amlodipine today  11.  History of prostate cancer with recent E. coli UTI/prostatitis.  Complete course of Augmentin 12.  CKD stage IV.  Baseline creatinine 2.0-2.2.  Follow-up chemistries on Monday    Latest Ref Rng & Units 03/27/2023   10:10 AM 03/22/2023    5:22 AM 03/21/2023   12:49 PM  BMP  Glucose 70 - 99 mg/dL 161  95  096   BUN 8 - 23 mg/dL 17  29  31    Creatinine 0.61 - 1.24 mg/dL 0.45  4.09  8.11   Sodium 135 - 145 mmol/L 134  136  132   Potassium 3.5 - 5.1 mmol/L 3.6  4.1  3.9   Chloride 98 - 111 mmol/L 104  100  103   CO2 22 - 32 mmol/L 22  22  19    Calcium 8.9 - 10.3 mg/dL 8.6  9.2  8.5    Creat at baseline, fluid intake poor BUN elevated over baseline   2-15: Repeat CMP today--BUN much improved, creatinine stable.  Some mild hyponatremia but with  half-normal saline so will likely self resolve.  13.  GERD/abdominal pain/nausea and vomiting.  Seen by GI recommended empiric PPI daily, Carafate. - No plans for endoscopic intervention at this time             No recurrence 2/18    14.  Hyperlipidemia.  Lipitor 15.  History of mesenteric artery stenosis.  Follow-up outpatient 16.  Anemia of chronic disease.  Continue iron supplement.  Occult blood negative.   Follow-up CBC stable      Latest Ref Rng & Units 03/27/2023   10:10 AM 03/22/2023    5:22 AM 03/21/2023   12:49 PM  CBC  WBC 4.0 - 10.5 K/uL 7.8  7.5  9.3   Hemoglobin 13.0 - 17.0 g/dL 9.5  8.9  8.7   Hematocrit 39.0 - 52.0 % 30.0  27.3  26.1   Platelets 150 - 400 K/uL 387  321  304      LOS: 11 days A FACE TO FACE EVALUATION WAS PERFORMED  Erick Colace 03/30/2023, 7:54 AM

## 2023-03-30 NOTE — Progress Notes (Signed)
 Physical Therapy Discharge Summary  Patient Details  Name: Brandon Harris MRN: [id number] Date of Birth: 1928/05/06  Date of Discharge from PT service:March 30, 2023  Today's Date: 03/30/2023 PT Individual Time: 1121-1200 PT Individual Time Calculation (min): 39 min    Patient has met 5 of 6 long term goals due to improved activity tolerance, improved balance, improved postural control, increased strength, decreased pain, functional use of  left lower extremity, and improved awareness.  Patient to discharge at an ambulatory level  CGA .   Patient's care partner is independent to provide the necessary physical assistance at discharge.  Reasons goals not met: Stair goal not met and deemed not appropriate as home has ramped entry and dtr is able to provide ambulatory assist up/ down ramp.    Recommendation:  Patient will benefit from ongoing skilled PT services in home health setting to continue to advance safe functional mobility, address ongoing impairments in strength, coordination, balance, activity tolerance, cognition, safety awareness, and to minimize fall risk.  Equipment: No equipment provided  Reasons for discharge: treatment goals met and discharge from hospital  Patient/family agrees with progress made and goals achieved: Yes  PT Discharge Precautions/Restrictions Precautions Precautions: Fall Restrictions Weight Bearing Restrictions Per Provider Order: No Vital Signs   Pain Pain Assessment Pain Scale: 0-10 Pain Score: 0-No pain Pain Interference Pain Interference Pain Effect on Sleep: 1. Rarely or not at all Pain Interference with Therapy Activities: 1. Rarely or not at all Pain Interference with Day-to-Day Activities: 1. Rarely or not at all Vision/Perception  Vision - History Ability to See in Adequate Light: 0 Adequate Vision - Assessment Eye Alignment: Within Functional Limits Ocular Range of Motion: Within Functional Limits Alignment/Gaze Preference:  Within Defined Limits Perception Perception: Within Functional Limits Praxis Praxis: WFL  Cognition Overall Cognitive Status: Within Functional Limits for tasks assessed Arousal/Alertness: Awake/alert Orientation Level: Oriented X4 Selective Attention: Appears intact Memory: Impaired Awareness: Appears intact Problem Solving: Appears intact Executive Function: Self Correcting Self Correcting: Impaired Safety/Judgment: Impaired Sensation Sensation Light Touch: Appears Intact Coordination Gross Motor Movements are Fluid and Coordinated: No Fine Motor Movements are Fluid and Coordinated: Yes Coordination and Movement Description: decreased 2/2 B LE weakeness and fatigue, improvement from inital evaluation Motor  Motor Motor: Hemiplegia;Abnormal postural alignment and control Motor - Discharge Observations: Improved balance and strength since inital eval; mild instabilty d/t forward flexed trunk, B LE weakness, and mild L lateral and posterior lean in standing  Mobility Bed Mobility Bed Mobility: Rolling Right;Rolling Left;Supine to Sit;Sit to Supine;Sitting - Scoot to [place] of Bed Rolling Right: Independent with assistive device Rolling Left: Independent with assistive device Supine to Sit: Independent with assistive device Sitting - Scoot to Edge of Bed: Independent with assistive device Sit to Supine: Independent with assistive device Transfers Transfers: Sit to Stand;Stand to Sit;Stand Pivot Transfers Sit to Stand: Contact Guard/Touching assist Stand to Sit: Supervision/Verbal cueing Stand Pivot Transfers: Supervision/Verbal cueing Stand Pivot Transfer Details: Verbal cues for precautions/safety;Verbal cues for technique Transfer (Assistive device): Rolling walker Locomotion  Gait Ambulation: Yes Gait Assistance: Contact Guard/Touching assist;Supervision/Verbal cueing Assistive device: Rolling walker Gait Assistance Details: Verbal cues for gait pattern;Verbal cues for  technique;Verbal cues for safe use of DME/AE Gait Gait: Yes Gait Pattern: Impaired Gait Pattern: Poor foot clearance - left;Step-through pattern;Decreased stance time - left;Left flexed knee in stance Gait velocity: decreased Stairs / Additional Locomotion Stairs: No Ramp: Contact Guard/touching assist Wheelchair Mobility Wheelchair Mobility: No  Trunk/Postural Assessment  Cervical Assessment Cervical Assessment:  Exceptions to [facility] (forward head) Thoracic Assessment Thoracic Assessment: Exceptions to [facility] (kyphosis) Lumbar Assessment Lumbar Assessment: Exceptions to [facility] (posterior pelvic tilt) Postural Control Postural Control: Deficits on evaluation (improvement from inital evaluation) Protective Responses: delayed  Balance Balance Balance Assessed: Yes Static Sitting Balance Static Sitting - Balance Support: Feet supported Static Sitting - Level of Assistance: 6: Modified independent (Device/Increase time) Dynamic Sitting Balance Dynamic Sitting - Balance Support: Feet supported Dynamic Sitting - Level of Assistance: 6: Modified independent (Device/Increase time) Static Standing Balance Static Standing - Balance Support: During functional activity;Bilateral upper extremity supported Static Standing - Level of Assistance: 5: Stand by assistance Dynamic Standing Balance Dynamic Standing - Balance Support: During functional activity;Bilateral upper extremity supported Dynamic Standing - Level of Assistance: Other (comment) (CGA) Extremity Assessment      RLE Assessment RLE Assessment: Exceptions to [facility] RLE Strength RLE Overall Strength: Deficits Right Hip Flexion: 4/5 Right Hip Extension: 4/5 Right Hip ABduction: 4/5 Right Hip ADduction: 4/5 Right Knee Flexion: 3+/5 Right Knee Extension: 4/5 Right Ankle Dorsiflexion: 4/5 Right Ankle Plantar Flexion: 4/5 LLE Assessment LLE Assessment: Exceptions to WFL LLE Strength LLE Overall Strength: Deficits Left Hip Flexion:  4-/5 Left Hip Extension: 4-/5 Left Hip ABduction: 4/5 Left Hip ADduction: 4/5 Left Knee Flexion: 3+/5 Left Knee Extension: 4-/5 Left Ankle Dorsiflexion: 4/5 Left Ankle Plantar Flexion: 4/5  Skilled Intervention: PT instructed pt in Grad day assessment to measure progress toward goals. See above for details. CARETool mobility assessment  also completed; see CAREtool tab in navigator for details.   Pt assisted in car transfer to actual car and is able to perform with step-in technique and CGA for safety in balance.   Loel Dubonnet PT, DPT, CSRS 03/30/2023, 5:08 PM

## 2023-03-30 NOTE — Plan of Care (Signed)
  Problem: RH Balance Goal: LTG: Patient will maintain dynamic sitting balance (OT) Description: LTG:  Patient will maintain dynamic sitting balance with assistance during activities of daily living (OT) Outcome: Completed/Met Goal: LTG Patient will maintain dynamic standing with ADLs (OT) Description: LTG:  Patient will maintain dynamic standing balance with assist during activities of daily living (OT)  Outcome: Completed/Met   Problem: Sit to Stand Goal: LTG:  Patient will perform sit to stand in prep for activites of daily living with assistance level (OT) Description: LTG:  Patient will perform sit to stand in prep for activites of daily living with assistance level (OT) Outcome: Completed/Met   Problem: RH Grooming Goal: LTG Patient will perform grooming w/assist,cues/equip (OT) Description: LTG: Patient will perform grooming with assist, with/without cues using equipment (OT) Outcome: Completed/Met   Problem: RH Bathing Goal: LTG Patient will bathe all body parts with assist levels (OT) Description: LTG: Patient will bathe all body parts with assist levels (OT) Outcome: Completed/Met   Problem: RH Dressing Goal: LTG Patient will perform upper body dressing (OT) Description: LTG Patient will perform upper body dressing with assist, with/without cues (OT). Outcome: Completed/Met Goal: LTG Patient will perform lower body dressing w/assist (OT) Description: LTG: Patient will perform lower body dressing with assist, with/without cues in positioning using equipment (OT) Outcome: Completed/Met   Problem: RH Toileting Goal: LTG Patient will perform toileting task (3/3 steps) with assistance level (OT) Description: LTG: Patient will perform toileting task (3/3 steps) with assistance level (OT)  Outcome: Completed/Met   Problem: RH Toilet Transfers Goal: LTG Patient will perform toilet transfers w/assist (OT) Description: LTG: Patient will perform toilet transfers with assist,  with/without cues using equipment (OT) Outcome: Completed/Met   Problem: RH Tub/Shower Transfers Goal: LTG Patient will perform tub/shower transfers w/assist (OT) Description: LTG: Patient will perform tub/shower transfers with assist, with/without cues using equipment (OT) Outcome: Completed/Met   Problem: RH Memory Goal: LTG Patient will demonstrate ability for day to day recall/carry over during activities of daily living with assistance level (OT) Description: LTG:  Patient will demonstrate ability for day to day recall/carry over during activities of daily living with assistance level (OT). Outcome: Completed/Met

## 2023-03-30 NOTE — Plan of Care (Signed)
  Problem: RH Problem Solving Goal: LTG Patient will demonstrate problem solving for (SLP) Description: LTG:  Patient will demonstrate problem solving for basic/complex daily situations with cues  (SLP) Outcome: Completed/Met Flowsheets (Taken 03/30/2023 1248) LTG: Patient will demonstrate problem solving for (SLP): Basic daily situations LTG Patient will demonstrate problem solving for: Supervision   Problem: RH Memory Goal: LTG Patient will demonstrate ability for day to day (SLP) Description: LTG:   Patient will demonstrate ability for day to day recall/carryover during cognitive/linguistic activities with assist  (SLP) Outcome: Completed/Met Flowsheets (Taken 03/30/2023 1248) LTG: Patient will demonstrate ability for day to day recall: New information LTG: Patient will demonstrate ability for day to day recall/carryover during cognitive/linguistic activities with assist (SLP): Supervision   Problem: RH Attention Goal: LTG Patient will demonstrate this level of attention during functional activites (SLP) Description: LTG:  Patient will will demonstrate this level of attention during functional activites (SLP) Outcome: Completed/Met Flowsheets (Taken 03/20/2023 0930 by Tacey Ruiz, CCC-SLP) Patient will demonstrate during cognitive/linguistic activities the attention type of: Alternating Patient will demonstrate this level of attention during cognitive/linguistic activities in: Home LTG: Patient will demonstrate this level of attention during cognitive/linguistic activities with assistance of (SLP): Supervision

## 2023-03-31 ENCOUNTER — Other Ambulatory Visit: Payer: Self-pay | Admitting: Cardiovascular Disease

## 2023-04-01 DIAGNOSIS — Z8719 Personal history of other diseases of the digestive system: Secondary | ICD-10-CM | POA: Diagnosis not present

## 2023-04-01 DIAGNOSIS — I6930 Unspecified sequelae of cerebral infarction: Secondary | ICD-10-CM | POA: Diagnosis not present

## 2023-04-01 DIAGNOSIS — I480 Paroxysmal atrial fibrillation: Secondary | ICD-10-CM | POA: Diagnosis not present

## 2023-04-02 NOTE — Consult Note (Signed)
 [facility] Liaison Note  04/02/2023  [name] [birth year] [id number]  Location: RN Hospital Liaison screened the patient remotely at [facility].  Insurance: [employer]   Brandon Harris is a 89 y.o. male who is a Primary Care Patient of Tally Joe, MD [facility]). The patient was screened for readmission hospitalization with noted high risk score for unplanned readmission risk with 2 IP/2 ED in 6 months.  The patient was assessed for potential Care Management service needs for post hospital transition for care coordination. Review of patient's electronic medical record reveals patient is stroke. Pt is managed by [facility] care management team. Liaison will collaborate with the [facility] team concerning pt's discharge disposition.  [facility] Care Management/Population Health does not replace or interfere with any arrangements made by the Inpatient Transition of Care team.   For questions contact:   Elliot Cousin, RN, [facility] Liaison [place]   [facility], Population Health Office Hours MTWF  8:00 am-6:00 pm Direct Dial: [phone] mobile [phone] [Office toll free line] Office Hours are M-F 8:30 - 5 pm [email or web address]

## 2023-04-07 DIAGNOSIS — I4510 Unspecified right bundle-branch block: Secondary | ICD-10-CM | POA: Diagnosis not present

## 2023-04-07 DIAGNOSIS — Z7901 Long term (current) use of anticoagulants: Secondary | ICD-10-CM | POA: Diagnosis not present

## 2023-04-07 DIAGNOSIS — R059 Cough, unspecified: Secondary | ICD-10-CM | POA: Diagnosis not present

## 2023-04-07 DIAGNOSIS — R9431 Abnormal electrocardiogram [ECG] [EKG]: Secondary | ICD-10-CM | POA: Diagnosis not present

## 2023-04-07 DIAGNOSIS — S02839A Fracture of medial orbital wall, unspecified side, initial encounter for closed fracture: Secondary | ICD-10-CM | POA: Diagnosis not present

## 2023-04-07 DIAGNOSIS — R609 Edema, unspecified: Secondary | ICD-10-CM | POA: Diagnosis not present

## 2023-04-07 DIAGNOSIS — R0989 Other specified symptoms and signs involving the circulatory and respiratory systems: Secondary | ICD-10-CM | POA: Diagnosis not present

## 2023-04-07 DIAGNOSIS — N281 Cyst of kidney, acquired: Secondary | ICD-10-CM | POA: Diagnosis not present

## 2023-04-07 DIAGNOSIS — S022XXA Fracture of nasal bones, initial encounter for closed fracture: Secondary | ICD-10-CM | POA: Diagnosis not present

## 2023-04-07 DIAGNOSIS — S02831A Fracture of medial orbital wall, right side, initial encounter for closed fracture: Secondary | ICD-10-CM | POA: Diagnosis not present

## 2023-04-07 DIAGNOSIS — N179 Acute kidney failure, unspecified: Secondary | ICD-10-CM | POA: Diagnosis not present

## 2023-04-07 DIAGNOSIS — R531 Weakness: Secondary | ICD-10-CM | POA: Diagnosis not present

## 2023-04-07 DIAGNOSIS — D649 Anemia, unspecified: Secondary | ICD-10-CM | POA: Diagnosis not present

## 2023-04-07 DIAGNOSIS — S0231XA Fracture of orbital floor, right side, initial encounter for closed fracture: Secondary | ICD-10-CM | POA: Diagnosis not present

## 2023-04-07 DIAGNOSIS — I252 Old myocardial infarction: Secondary | ICD-10-CM | POA: Diagnosis not present

## 2023-04-07 DIAGNOSIS — W19XXXA Unspecified fall, initial encounter: Secondary | ICD-10-CM | POA: Diagnosis not present

## 2023-04-07 DIAGNOSIS — S0083XA Contusion of other part of head, initial encounter: Secondary | ICD-10-CM | POA: Diagnosis not present

## 2023-04-07 DIAGNOSIS — Z8673 Personal history of transient ischemic attack (TIA), and cerebral infarction without residual deficits: Secondary | ICD-10-CM | POA: Diagnosis not present

## 2023-04-07 DIAGNOSIS — S0990XA Unspecified injury of head, initial encounter: Secondary | ICD-10-CM | POA: Diagnosis not present

## 2023-04-07 DIAGNOSIS — Z043 Encounter for examination and observation following other accident: Secondary | ICD-10-CM | POA: Diagnosis not present

## 2023-04-07 DIAGNOSIS — I4891 Unspecified atrial fibrillation: Secondary | ICD-10-CM | POA: Diagnosis not present

## 2023-04-12 ENCOUNTER — Other Ambulatory Visit: Payer: Self-pay | Admitting: Cardiovascular Disease

## 2023-04-14 ENCOUNTER — Ambulatory Visit: Payer: Medicare Other | Admitting: Physician Assistant

## 2023-04-14 MED ORDER — ISOSORBIDE MONONITRATE ER 30 MG PO TB24: 15.0000 mg | ORAL_TABLET | Freq: Every day | ORAL | 3 refills | Status: DC

## 2023-04-20 ENCOUNTER — Other Ambulatory Visit (HOSPITAL_COMMUNITY): Payer: Self-pay

## 2023-04-21 ENCOUNTER — Other Ambulatory Visit: Payer: Self-pay

## 2023-04-21 ENCOUNTER — Other Ambulatory Visit (HOSPITAL_COMMUNITY): Payer: Self-pay

## 2023-04-21 MED ORDER — AMLODIPINE BESYLATE 5 MG PO TABS
5.0000 mg | ORAL_TABLET | Freq: Every day | ORAL | 0 refills | Status: DC
  Filled 2023-04-21: qty 90, 90d supply, fill #0

## 2023-04-21 MED ORDER — PANTOPRAZOLE SODIUM 40 MG PO TBEC
DELAYED_RELEASE_TABLET | ORAL | 2 refills | Status: AC
  Filled 2023-04-21: qty 30, 30d supply, fill #0

## 2023-04-21 MED ORDER — DICLOFENAC SODIUM 1 % EX GEL
4.0000 g | Freq: Four times a day (QID) | CUTANEOUS | 1 refills | Status: AC
  Filled 2023-04-21: qty 500, 31d supply, fill #0

## 2023-04-21 MED ORDER — ELIQUIS 2.5 MG PO TABS
2.5000 mg | ORAL_TABLET | Freq: Two times a day (BID) | ORAL | 1 refills | Status: AC
  Filled 2023-04-21: qty 180, 90d supply, fill #0

## 2023-04-22 ENCOUNTER — Other Ambulatory Visit: Payer: Self-pay

## 2023-04-22 ENCOUNTER — Other Ambulatory Visit (HOSPITAL_COMMUNITY): Payer: Self-pay

## 2023-04-22 MED ORDER — SUCRALFATE 1 G PO TABS
1.0000 g | ORAL_TABLET | Freq: Two times a day (BID) | ORAL | 0 refills | Status: AC
  Filled 2023-04-22: qty 120, 30d supply, fill #0

## 2023-04-23 NOTE — Progress Notes (Signed)
 [facility] CONSULT NOTE  Patient Care Team: Tally Joe, MD as PCP - General Nahser, Deloris Ping, MD as PCP - Cardiology (Cardiology)  ASSESSMENT & PLAN:  Brandon Harris is a 89 y.o.male with history of history of stroke in the 1980's, right middle cerebral artery stroke, MI in 1983, atrial fibrillation with RVR, CAD/CABG(2001), Hypertension, prostate cancer, urinary urgency, CKD stage V, GERD, Hyperlipidemia, mesenteric artery stenosis, anemia of chronic disease [facility].  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 iPSA 8.91. Germline testing: not yet Somatic testing: not yet Treatment: on ADT. Last in Dec through [facility].  The patient was counseled on the natural history of prostate cancer and the standard treatment options that are available for prostate cancer.  [facility] is [facility] on [facility].  Most recently with stroke event presented to the emergency room followed by hospitalization.  Overall he is recovering however still very fatigued, and with fall recently after his stroke.  He also has history of cardiovascular risk factors and comorbidities including atrial fibrillation, CAD, CABG, CKD, hypertension and hyperlipidemia.  Report minimal fatigue, and loss of quality of life after his Eligard injection.  We discussed that metastatic prostate cancer is not curable. Treatment need to be balanced with efficacy, and toxicity, quality of life.  Daughter is very concerned about his ability to tolerate antiandrogen given his cardiovascular history and recent stroke.  We discussed we will obtain PSA, PSMA test for evaluation.  If findings of oligometastasis, may consider metastatic directed therapy with radiation or near future.  He understands.  We also talked about genetic testing given he has metastatic prostate cancer.  This is nonurgent.  Can be obtained with future lab appointment.  We will have our nurse  navigator following as well  Prostate cancer (HCC) Hold on additional ADT for now. Last dose in Dec (6 mo dosage) PSA, and labs today PSMA PET and treat oligometastasis if able Continue follow up with Urology   History of cardioembolic cerebrovascular accident (CVA) Continue manage CV risk factors as able. Currently on apixaban 2.5 mg twice daily. On statin Will be seeing his cardiologist this afternoon  At risk for side effect of medication Supportive calcium (1000-1200 mg daily from food and supplements) and vitamin D3 (1000 IU daily) Aggressive cardiovascular risk management Drink plenty of fluid daily.  Normocytic anemia Anemia of chronic disease. Will check B12, folate and ferritin   Orders Placed This Encounter  Procedures   NM PET (PSMA) SKULL TO MID THIGH    Standing Status:   Future    Expected Date:   05/03/2023    Expiration Date:   04/25/2024    If indicated for the ordered procedure, I authorize the administration of a radiopharmaceutical per Radiology protocol:   Yes    Preferred imaging location?:   [place]   CBC with Differential (Cancer Center Only)    Standing Status:   Future    Expiration Date:   04/25/2024   CMP (Cancer Center only)    Standing Status:   Future    Expiration Date:   04/25/2024   Prostate-Specific AG, Serum    Standing Status:   Future    Expiration Date:   04/25/2024   Testosterone    Standing Status:   Future    Expiration Date:   04/25/2024   Ferritin    Standing Status:   Future    Expiration Date:  04/25/2024   Folate    Standing Status:   Future    Expiration Date:   04/25/2024   Vitamin B12    Standing Status:   Future    Expiration Date:   04/25/2024    All questions were answered. The patient knows to call the clinic with any problems, questions or concerns. No barriers to learning was detected.  [name], MD 3/17/202510:53 AM  CHIEF COMPLAINTS/PURPOSE OF CONSULTATION:  Prostate cancer  HISTORY OF  PRESENTING ILLNESS:  Brandon Harris 89 y.o. male is here because of prostate cancer.  Available outside records reviewed.  Reported history of prostate cancer and BPH following Dr. Berniece Salines.  Report of diagnosis of prostate cancer in 06/07/2019.  Gleason 3+3 = 6. iPSA 8.91. Report patient was treated with cryoablation.  Report was in some type of clinical trial with a shot  PSA: 01/2010: 0.21. 03/2015: 4.26 07/2016: 8.61 02/2017: 6.0 08/2017: 6.73 03/2018: 6.84 09/2018:10.00 10/2019: 13.2 05/2020: 14.0 11/2020: 16.2 04/2021:15.5 05/21/2022 PSA 29.5  06/23/22 PSMA PET 1. Progressive tracer affinity throughout the prostate, consistent with residual/locally recurrent disease. 2. Isolated anterior right fourth rib tracer affinity and subtle sclerosis. Favored to represent isolated osseous metastasis. Interval nondisplaced fracture since 05/22/2021 is possible but felt less likely. 3. Incidental findings, including: Aortic Atherosclerosis (ICD10-I70.0). Pulmonary artery enlargement suggests pulmonary arterial hypertension.   11/23/25 PSA 25.1 report started samples of orgovyx.  12/2022 PSA 0.96. testosterone <10  01/11/23 due to insurance cost he was switched to Eligard every 6 months.  PSA decreased to 0.96.  Report of fatigue from orgovyx.   Records showed patient was presented to emergency room on 03/11/2023 for URI and GI symptoms thought and treated with antibiotics with Augmentin and antiemetics.  He returned to emergency room 2 days later with left upper extremity and lower extremity numbness.  He was admitted from 2/1 to 3/7 and then transferred to [facility] physical medicine and rehabitation until 2/18.  MRI showed scattered small acute cortical and subcortical infarcts in the posterior right frontal and anterior right parietal lobes.  He was also found to have newly diagnosed atrial fibrillation and thought to be causing his strokes.  He was started on Eliquis.  Report of  fall in Feb of 2025 with laceration and went to [facility] and he refused hospitalization.   He is now using a walker, and difficulty getting up by himself. Report Eligard made him more fatigue.  I have reviewed his chart and materials related to his cancer extensively and collaborated history with the patient. Summary of oncologic history is as follows:  Oncology History  Prostate cancer (HCC)  09/22/2021 Initial Diagnosis   Prostate cancer (HCC)   04/26/2023 Cancer Staging   Staging form: Prostate, AJCC 8th Edition - Clinical: Stage IVB (cTX, cN0, cM1b) - Signed by [name], MD on 04/26/2023     MEDICAL HISTORY:  Past Medical History:  Diagnosis Date   Arthritis    Carotid arterial disease (HCC)    Chronic kidney disease (CKD), stage IV (severe) (HCC)    Diverticulosis    colonic diverticulosis   Gallstones    GERD (gastroesophageal reflux disease)    Hypertension    Myocardial infarction [facility])    Prostate cancer (HCC)    Status post coronary artery bypass grafting     intraoperative cholangiogram   Stroke [facility])    patient reports no deficits    Urinary incontinence    bladder leakage - "  patient wears a pad"    SURGICAL HISTORY: Past Surgical History:  Procedure Laterality Date   BACK SURGERY     x 4, has titanium in back per patient    CHOLECYSTECTOMY, LAPAROSCOPIC     CORONARY ARTERY BYPASS GRAFT  2001   CRYOTHERAPY     Prostate Cancer   LEFT HEART CATH AND CORS/GRAFTS ANGIOGRAPHY N/A 10/27/2017   Procedure: LEFT HEART CATH AND CORS/GRAFTS ANGIOGRAPHY;  Surgeon: Lyn Records, MD;  Location: [facility] INVASIVE CV LAB;  Service: Cardiovascular;  Laterality: N/A;   prostate cancer     ROTATOR CUFF REPAIR     LEFT   TOTAL KNEE ARTHROPLASTY Right 12/18/2014   Procedure: TOTAL RIGHT KNEE ARTHROPLASTY;  Surgeon: Durene Romans, MD;  Location: [facility] ORS;  Service: Orthopedics;  Laterality: Right;    SOCIAL HISTORY: Social History   Socioeconomic History    Marital status: Widowed    Spouse name: Not on file   Number of children: Not on file   Years of education: Not on file   Highest education level: Not on file  Occupational History   Not on file  Tobacco Use   Smoking status: Never   Smokeless tobacco: Never  Vaping Use   Vaping status: Never Used  Substance and Sexual Activity   Alcohol use: No   Drug use: No   Sexual activity: Not Currently  Other Topics Concern   Not on file  Social History Narrative   Not on file   Social Drivers of Health   Financial Resource Strain: Not on file  Food Insecurity: No Food Insecurity (03/14/2023)   Hunger Vital Sign    Worried About Running Out of Food in the Last Year: Never true    Ran Out of Food in the Last Year: Never true  Transportation Needs: No Transportation Needs (03/14/2023)   PRAPARE - Administrator, Civil Service (Medical): No    Lack of Transportation (Non-Medical): No  Physical Activity: Not on file  Stress: Not on file  Social Connections: Moderately Isolated (03/14/2023)   Social Connection and Isolation Panel [NHANES]    Frequency of Communication with Friends and Family: Twice a week    Frequency of Social Gatherings with Friends and Family: Three times a week    Attends Religious Services: More than 4 times per year    Active Member of Clubs or Organizations: No    Attends Banker Meetings: Never    Marital Status: Widowed  Intimate Partner Violence: Not At Risk (03/14/2023)   Humiliation, Afraid, Rape, and Kick questionnaire    Fear of Current or Ex-Partner: No    Emotionally Abused: No    Physically Abused: No    Sexually Abused: No    FAMILY HISTORY: Family History  Problem Relation Age of Onset   Heart failure Mother     ALLERGIES:  is allergic to amlodipine besylate, mirabegron, and vibegron.  MEDICATIONS:  Current Outpatient Medications  Medication Sig Dispense Refill   acetaminophen (TYLENOL) 325 MG tablet Take 2 tablets (650  mg total) by mouth every 4 (four) hours as needed for mild pain (pain score 1-3) (or temp > 37.5 C (99.5 F)).     ALPRAZolam (XANAX) 0.25 MG tablet Take 1 tablet (0.25 mg total) by mouth at bedtime as needed for anxiety or sleep. 30 tablet 0   amLODipine (NORVASC) 10 MG tablet TAKE 1 TABLET BY MOUTH EVERY  MORNING 90 tablet 3   amLODipine (NORVASC) 5  MG tablet Take 1 tablet (5 mg total) by mouth daily. 90 tablet 0   apixaban (ELIQUIS) 2.5 MG TABS tablet Take 1 tablet (2.5 mg total) by mouth 2 (two) times daily. 180 tablet 1   atorvastatin (LIPITOR) 80 MG tablet Take 1 tablet (80 mg total) by mouth every evening. 30 tablet 0   carvedilol (COREG) 12.5 MG tablet TAKE 1 TABLET BY MOUTH EVERY  MORNING AND TAKE 1 TABLET BY  MOUTH EVERY DAY AT BEDTIME 180 tablet 3   diclofenac Sodium (VOLTAREN) 1 % GEL Apply 4 g topically 4 (four) times daily. 500 g 1   Ferrous Sulfate (IRON PO) Take 1 tablet by mouth daily.     isosorbide mononitrate (IMDUR) 30 MG 24 hr tablet Take 0.5 tablets (15 mg total) by mouth daily. 45 tablet 3   mirtazapine (REMERON) 15 MG tablet Take 1 tablet (15 mg total) by mouth at bedtime. 30 tablet 0   nitroGLYCERIN (NITROSTAT) 0.4 MG SL tablet Dissolve 1 tab under tongue as needed for chest pain. May repeat every 5 minutes x 2 doses. If no relief call 9-1-1. 25 tablet 11   pantoprazole (PROTONIX) 40 MG tablet Take 1 tablet by mouth 1/2 to 1 hour before morning meal 30 tablet 2   sucralfate (CARAFATE) 1 g tablet Take 1 tablet by mouth four times daily (with meals and at bedtime) 120 tablet 0   No current facility-administered medications for this visit.    REVIEW OF SYSTEMS:   All relevant systems were reviewed with the patient and are negative.  PHYSICAL EXAMINATION: ECOG PERFORMANCE STATUS: 2 - Symptomatic, <50% confined to bed  Vitals:   04/26/23 0950  BP: 124/73  Pulse: 96  Resp: 17  Temp: 97.6 F (36.4 C)  SpO2: 97%   Filed Weights   04/26/23 0950  Weight: 189 lb 12.8  oz (86.1 kg)    GENERAL: alert, no distress and comfortable, using a walker SKIN: skin color is normal, no jaundice EYES: sclera clear LUNGS: Effort normal, no respiratory distress.  Clear to auscultation bilaterally HEART: regular rate ABDOMEN: soft, non-tender and nondistended   LABORATORY DATA:  I have reviewed the data as listed Lab Results  Component Value Date   WBC 7.8 03/27/2023   HGB 9.5 (L) 03/27/2023   HCT 30.0 (L) 03/27/2023   MCV 94.0 03/27/2023   PLT 387 03/27/2023   Recent Labs    03/09/23 0735 03/13/23 1207 03/15/23 0944 03/16/23 0531 03/21/23 1249 03/22/23 0522 03/27/23 1010  NA 132*   < > 135   < > 132* 136 134*  K 3.5   < > 4.0   < > 3.9 4.1 3.6  CL 96*   < > 102   < > 103 100 104  CO2 25   < > 21*   < > 19* 22 22  GLUCOSE 134*   < > 114*   < > 102* 95 116*  BUN 27*   < > 16   < > 31* 29* 17  CREATININE 2.17*   < > 2.01*   < > 1.93* 1.85* 1.95*  CALCIUM 9.1   < > 9.2   < > 8.5* 9.2 8.6*  GFRNONAA 28*   < > 31*   < > 32* 34* 32*  PROT 6.9  --   --   --   --  5.5* 6.1*  ALBUMIN 3.8  --  3.5  --   --  2.3* 2.6*  AST 22  --   --   --   --  16 29  ALT 22  --   --   --   --  12 23  ALKPHOS 65  --   --   --   --  44 57  BILITOT 0.8  --   --   --   --  0.4 0.4   < > = values in this interval not displayed.    RADIOGRAPHIC STUDIES: I have personally reviewed the radiological images as listed and agreed with the findings in the report. No results found.

## 2023-04-26 ENCOUNTER — Inpatient Hospital Stay

## 2023-04-26 ENCOUNTER — Other Ambulatory Visit (HOSPITAL_COMMUNITY): Payer: Self-pay

## 2023-04-26 ENCOUNTER — Other Ambulatory Visit: Payer: Self-pay

## 2023-04-26 VITALS — BP 124/73 | HR 96 | Temp 97.6°F | Resp 17 | Ht 71.0 in | Wt 189.8 lb

## 2023-04-26 DIAGNOSIS — Z7901 Long term (current) use of anticoagulants: Secondary | ICD-10-CM | POA: Insufficient documentation

## 2023-04-26 DIAGNOSIS — Z8249 Family history of ischemic heart disease and other diseases of the circulatory system: Secondary | ICD-10-CM | POA: Insufficient documentation

## 2023-04-26 DIAGNOSIS — Z8673 Personal history of transient ischemic attack (TIA), and cerebral infarction without residual deficits: Secondary | ICD-10-CM | POA: Insufficient documentation

## 2023-04-26 DIAGNOSIS — R3915 Urgency of urination: Secondary | ICD-10-CM | POA: Insufficient documentation

## 2023-04-26 DIAGNOSIS — C61 Malignant neoplasm of prostate: Secondary | ICD-10-CM

## 2023-04-26 DIAGNOSIS — D638 Anemia in other chronic diseases classified elsewhere: Secondary | ICD-10-CM | POA: Insufficient documentation

## 2023-04-26 DIAGNOSIS — Z9049 Acquired absence of other specified parts of digestive tract: Secondary | ICD-10-CM | POA: Diagnosis not present

## 2023-04-26 DIAGNOSIS — E785 Hyperlipidemia, unspecified: Secondary | ICD-10-CM | POA: Diagnosis not present

## 2023-04-26 DIAGNOSIS — R200 Anesthesia of skin: Secondary | ICD-10-CM | POA: Diagnosis not present

## 2023-04-26 DIAGNOSIS — I120 Hypertensive chronic kidney disease with stage 5 chronic kidney disease or end stage renal disease: Secondary | ICD-10-CM | POA: Insufficient documentation

## 2023-04-26 DIAGNOSIS — Z79899 Other long term (current) drug therapy: Secondary | ICD-10-CM | POA: Diagnosis not present

## 2023-04-26 DIAGNOSIS — N400 Enlarged prostate without lower urinary tract symptoms: Secondary | ICD-10-CM | POA: Insufficient documentation

## 2023-04-26 DIAGNOSIS — I252 Old myocardial infarction: Secondary | ICD-10-CM | POA: Diagnosis not present

## 2023-04-26 DIAGNOSIS — D649 Anemia, unspecified: Secondary | ICD-10-CM

## 2023-04-26 DIAGNOSIS — N185 Chronic kidney disease, stage 5: Secondary | ICD-10-CM | POA: Insufficient documentation

## 2023-04-26 DIAGNOSIS — K219 Gastro-esophageal reflux disease without esophagitis: Secondary | ICD-10-CM | POA: Diagnosis not present

## 2023-04-26 DIAGNOSIS — R5383 Other fatigue: Secondary | ICD-10-CM | POA: Insufficient documentation

## 2023-04-26 DIAGNOSIS — I4891 Unspecified atrial fibrillation: Secondary | ICD-10-CM | POA: Insufficient documentation

## 2023-04-26 DIAGNOSIS — I2510 Atherosclerotic heart disease of native coronary artery without angina pectoris: Secondary | ICD-10-CM | POA: Diagnosis not present

## 2023-04-26 DIAGNOSIS — I700 Atherosclerosis of aorta: Secondary | ICD-10-CM | POA: Insufficient documentation

## 2023-04-26 DIAGNOSIS — Z9189 Other specified personal risk factors, not elsewhere classified: Secondary | ICD-10-CM | POA: Insufficient documentation

## 2023-04-26 LAB — CBC WITH DIFFERENTIAL (CANCER CENTER ONLY)
Abs Immature Granulocytes: 0.07 10*3/uL (ref 0.00–0.07)
Basophils Absolute: 0 10*3/uL (ref 0.0–0.1)
Basophils Relative: 0 %
Eosinophils Absolute: 0.7 10*3/uL — ABNORMAL HIGH (ref 0.0–0.5)
Eosinophils Relative: 6 %
HCT: 27.6 % — ABNORMAL LOW (ref 39.0–52.0)
Hemoglobin: 8.8 g/dL — ABNORMAL LOW (ref 13.0–17.0)
Immature Granulocytes: 1 %
Lymphocytes Relative: 21 %
Lymphs Abs: 2.1 10*3/uL (ref 0.7–4.0)
MCH: 28.9 pg (ref 26.0–34.0)
MCHC: 31.9 g/dL (ref 30.0–36.0)
MCV: 90.8 fL (ref 80.0–100.0)
Monocytes Absolute: 1 10*3/uL (ref 0.1–1.0)
Monocytes Relative: 10 %
Neutro Abs: 6.5 10*3/uL (ref 1.7–7.7)
Neutrophils Relative %: 62 %
Platelet Count: 311 10*3/uL (ref 150–400)
RBC: 3.04 MIL/uL — ABNORMAL LOW (ref 4.22–5.81)
RDW: 14.5 % (ref 11.5–15.5)
WBC Count: 10.4 10*3/uL (ref 4.0–10.5)
nRBC: 0 % (ref 0.0–0.2)

## 2023-04-26 LAB — FERRITIN: Ferritin: 20 ng/mL — ABNORMAL LOW (ref 24–336)

## 2023-04-26 LAB — CMP (CANCER CENTER ONLY)
ALT: 12 U/L (ref 0–44)
AST: 15 U/L (ref 15–41)
Albumin: 3.7 g/dL (ref 3.5–5.0)
Alkaline Phosphatase: 103 U/L (ref 38–126)
Anion gap: 6 (ref 5–15)
BUN: 16 mg/dL (ref 8–23)
CO2: 26 mmol/L (ref 22–32)
Calcium: 9 mg/dL (ref 8.9–10.3)
Chloride: 103 mmol/L (ref 98–111)
Creatinine: 1.91 mg/dL — ABNORMAL HIGH (ref 0.61–1.24)
GFR, Estimated: 32 mL/min — ABNORMAL LOW (ref >60)
Glucose, Bld: 99 mg/dL (ref 70–99)
Potassium: 4.2 mmol/L (ref 3.5–5.1)
Sodium: 135 mmol/L (ref 135–145)
Total Bilirubin: 0.4 mg/dL (ref 0.0–1.2)
Total Protein: 6.9 g/dL (ref 6.5–8.1)

## 2023-04-26 LAB — FOLATE: Folate: 21 ng/mL (ref >5.9)

## 2023-04-26 LAB — VITAMIN B12: Vitamin B-12: 332 pg/mL (ref 180–914)

## 2023-04-26 NOTE — Progress Notes (Signed)
 Introduced myself to the patient/patient's daughter, as the prostate nurse navigator.  He is here to discuss his systemic treatment options with Dr. Cherly Hensen.  Patient will proceed with a PSMA PET for further work up.  I gave him my business card and asked him to call me with questions or concerns.  Verbalized understanding.

## 2023-04-26 NOTE — Assessment & Plan Note (Signed)
 Anemia of chronic disease. Will check B12, folate and ferritin

## 2023-04-26 NOTE — Assessment & Plan Note (Addendum)
 Continue manage CV risk factors as able. Currently on apixaban 2.5 mg twice daily. On statin Will be seeing his cardiologist this afternoon

## 2023-04-26 NOTE — Assessment & Plan Note (Signed)
 Supportive calcium (1000-1200 mg daily from food and supplements) and vitamin D3 (1000 IU daily) Aggressive cardiovascular risk management Drink plenty of fluid daily.

## 2023-04-26 NOTE — Assessment & Plan Note (Addendum)
 Hold on additional ADT for now. Last dose in Dec (6 mo dosage) PSA, and labs today PSMA PET and treat oligometastasis if able Continue follow up with Urology

## 2023-04-27 LAB — TESTOSTERONE: Testosterone: <3 ng/dL — ABNORMAL LOW (ref 264–916)

## 2023-04-27 LAB — PROSTATE-SPECIFIC AG, SERUM (LABCORP): Prostate Specific Ag, Serum: <0.1 ng/mL (ref 0.0–4.0)

## 2023-04-28 ENCOUNTER — Other Ambulatory Visit: Payer: Self-pay

## 2023-04-28 ENCOUNTER — Telehealth: Payer: Self-pay

## 2023-04-28 DIAGNOSIS — D508 Other iron deficiency anemias: Secondary | ICD-10-CM

## 2023-04-28 DIAGNOSIS — D509 Iron deficiency anemia, unspecified: Secondary | ICD-10-CM | POA: Insufficient documentation

## 2023-04-28 NOTE — Progress Notes (Signed)
 Iron deficiency with anemia. Ferritin of 20. Ordered feraheme x 2 doses.

## 2023-04-28 NOTE — Telephone Encounter (Signed)
 Dr. Cherly Hensen, patient will be scheduled as soon as possible.  Auth Submission: NO AUTH NEEDED Site of care: Site of care: [facility] Payer: [employer] medicare Medication & CPT/J Code(s) submitted: Feraheme (ferumoxytol) [id number] Route of submission (phone, fax, portal): portal Phone # Fax # Auth type: Buy/Bill PB Units/visits requested: 510mg  x 2 doses Reference number: [id number] Approval from: 04/28/23 to 10/29/23

## 2023-04-29 LAB — METHYLMALONIC ACID, SERUM: Methylmalonic Acid, Quantitative: 278 nmol/L (ref 0–378)

## 2023-05-03 ENCOUNTER — Encounter: Payer: Self-pay | Admitting: Cardiovascular Disease

## 2023-05-03 NOTE — Progress Notes (Unsigned)
  Cardiology Office Note:  .   Date:  05/04/2023  ID:  Brandon Harris, DOB 02/17/1928, MRN [id number] PCP: Brandon Joe, MD  [facility] Providers Cardiologist:  Brandon Miss, MD    History of Present Illness: .    Jan. 7, 2025  Seen with daughter, Brandon Harris is a 89 y.o. male previous patient of Dr. Katrinka Harris / Brandon Harris. I am meeting him for the first time today  Hx of CAD, CABG ( 2001) , carotid artery disease , HTN, renal artery stenosis , HLD  CKD  No angina   Has prostate cancer ( sees Dr. Marlou Harris )  with mets to several of his ribs   Hx of back surgery .  Has a titanium rod up most of his back Able to ride his 4 wheeler, mows 5 acres  In the past, has raised horsed, cows, goats, chickens   Has Hyperlipidemia: He had labs recently with Dr. Erling Harris.  His last LDL is 64, HDL is 33, total cholesterol is 123, triglyceride level is 145.  May 04, 2023 [name]  is seen for follow up of his CAD , CAB, HTN, renal artery stenosis, HLD   He received an Eligard injection at urology .  30 days later he started having some side effects ( dry cough, BP variations, nausea, vomitting )  and eventually had a stroke     He was hospitalized in Feb, 2025 with a stroke and new dx of atrial fib  Echo shows LVEF of 65-70% Was started on Eliquis 2.5 BID, Coreg       ROS:   Studies Reviewed: .         Risk Assessment/Calculations:      Physical Exam:     Physical Exam: Blood pressure 112/64, pulse 86, height 5\' 11"  (1.803 m), weight 189 lb (85.7 kg), SpO2 95%.       GEN: elderly , frail man , examined in wheelchair  in no acute distress HEENT: Normal NECK: No JVD; No carotid bruits LYMPHATICS: No lymphadenopathy CARDIAC:  irreg. Irreg.  RESPIRATORY:  Clear to auscultation without rales, wheezing or rhonchi  ABDOMEN: Soft, non-tender, non-distended MUSCULOSKELETAL:  No edema; No deformity  SKIN: Warm and dry NEUROLOGIC:  Alert and oriented x  3   ASSESSMENT AND PLAN: .      CAD , s/p CABG.   he denies any episodes of chest pain.  2.  Hyperlipidemia:    cont atorvastatin    3.  Atrial fibrillation: He was diagnosed with a new stroke and new atrial fibrillation when he was hospitalized several months ago.  He is currently on Eliquis 2.5 mg twice a day.  He is quite debilitated.  He is not a good candidate for cardioversion.  Would like to see him rehab and get quite a bit stronger before we consider doing a cardioversion.  Will have him return in 6 months to see [place] for follow-up evaluation.       Dispo: 1 year    Signed, Brandon Miss, MD

## 2023-05-04 ENCOUNTER — Encounter: Payer: Self-pay | Admitting: Cardiovascular Disease

## 2023-05-04 ENCOUNTER — Ambulatory Visit: Attending: Cardiovascular Disease | Admitting: Cardiovascular Disease

## 2023-05-04 VITALS — BP 112/64 | HR 86 | Ht 71.0 in | Wt 189.0 lb

## 2023-05-04 DIAGNOSIS — I10 Essential (primary) hypertension: Secondary | ICD-10-CM | POA: Diagnosis not present

## 2023-05-04 DIAGNOSIS — I4819 Other persistent atrial fibrillation: Secondary | ICD-10-CM | POA: Diagnosis not present

## 2023-05-04 MED ORDER — AMLODIPINE BESYLATE 5 MG PO TABS: 5.0000 mg | ORAL_TABLET | Freq: Every day | ORAL | 3 refills | Status: DC

## 2023-05-04 NOTE — Patient Instructions (Addendum)
 Medication Instructions:  REFILLED Amlodipine 5mg  daily *If you need a refill on your cardiac medications before your next appointment, please call your pharmacy*  Follow-Up: At [facility], you and your health needs are our priority.  As part of our continuing mission to provide you with exceptional heart care, we have created designated Provider Care Teams.  These Care Teams include your primary Cardiologist (physician) and Advanced Practice Providers (APPs -  Physician Assistants and Nurse Practitioners) who all work together to provide you with the care you need, when you need it.  Your next appointment:   6 month(s)  Provider:   Kristeen Miss, MD  or APP   Other Instructions For your  leg edema you  should do  the following 1. Leg elevation - I recommend the Lounge Dr. Leg rest.  See below for details  2. Salt restriction  -  Use potassium chloride instead of regular salt as a salt substitute. 3. Walk regularly 4. Compression hose - Medical Supply store  5. Weight loss    Available on [email or web address] Or  Go to [email or web address]      1st Floor: - Lobby - Registration  - Pharmacy  - Lab - Cafe  2nd Floor: - PV Lab - Diagnostic Testing (echo, CT, nuclear med)  3rd Floor: - Vacant  4th Floor: - TCTS (cardiothoracic surgery) - AFib Clinic - Structural Heart Clinic - Vascular Surgery  - Vascular Ultrasound  5th Floor: - HeartCare Cardiology (general and EP) - Clinical Pharmacy for coumadin, hypertension, lipid, weight-loss medications, and med management appointments    Valet parking services will be available as well.

## 2023-05-04 NOTE — Progress Notes (Unsigned)
 [facility] [address] [place]. [place] [address] [phone]       HOSPITAL FOLLOW UP NOTE  Mr. Brandon Harris Date of Birth:  1928/05/28 Medical Record Number:  [id number]   Reason for Referral:  hospital stroke follow up    SUBJECTIVE:   CHIEF COMPLAINT:  No chief complaint on file.   HPI:   Mr. Brandon Harris is a 89 y.o. male with history of CAD/MI status post CABG, CKD 4, stroke 30 years ago without residual deficit who presented to ED on 03/13/2023 with left-sided numbness and difficulty walking.  Stroke workup revealed right MCA cortical several small infarcts, embolic likely secondary to newly diagnosed A-fib with RVR.  Carotid Doppler showed bilateral ICA 40 to 59% stenosis.  EF 65 to 70%.  LDL 35.  A1c 5.5.  Initially placed on heparin and transition to Eliquis twice daily.  Continue atorvastatin 80 mg daily.  Therapies recommended discharge to CIR.        PERTINENT IMAGING  MRI right MCA cortical several small infarcts MRA head unremarkable Carotid Doppler bilateral ICA 40 to 59% stenosis 2D Echo EF 65 to 70% LDL 35 HgbA1c 5.5    ROS:   14 system review of systems performed and negative with exception of ***  PMH:  Past Medical History:  Diagnosis Date   Arthritis    Carotid arterial disease (HCC)    Chronic kidney disease (CKD), stage IV (severe) (HCC)    Diverticulosis    colonic diverticulosis   Gallstones    GERD (gastroesophageal reflux disease)    Hypertension    Myocardial infarction (HCC)    Prostate cancer (HCC)    Status post coronary artery bypass grafting     intraoperative cholangiogram   Stroke [facility])    patient reports no deficits    Urinary incontinence    bladder leakage - " patient wears a pad"    PSH:  Past Surgical History:  Procedure Laterality Date   BACK SURGERY     x 4, has titanium in back per patient    CHOLECYSTECTOMY, LAPAROSCOPIC     CORONARY ARTERY BYPASS GRAFT  2001   CRYOTHERAPY      Prostate Cancer   LEFT HEART CATH AND CORS/GRAFTS ANGIOGRAPHY N/A 10/27/2017   Procedure: LEFT HEART CATH AND CORS/GRAFTS ANGIOGRAPHY;  Surgeon: Lyn Records, MD;  Location: [facility] INVASIVE CV LAB;  Service: Cardiovascular;  Laterality: N/A;   prostate cancer     ROTATOR CUFF REPAIR     LEFT   TOTAL KNEE ARTHROPLASTY Right 12/18/2014   Procedure: TOTAL RIGHT KNEE ARTHROPLASTY;  Surgeon: Durene Romans, MD;  Location: [facility] ORS;  Service: Orthopedics;  Laterality: Right;    Social History:  Social History   Socioeconomic History   Marital status: Widowed    Spouse name: Not on file   Number of children: Not on file   Years of education: Not on file   Highest education level: Not on file  Occupational History   Not on file  Tobacco Use   Smoking status: Never   Smokeless tobacco: Never  Vaping Use   Vaping status: Never Used  Substance and Sexual Activity   Alcohol use: No   Drug use: No   Sexual activity: Not Currently  Other Topics Concern   Not on file  Social History Narrative   Not on file   Social Drivers of Health   Financial Resource Strain: Not on file  Food Insecurity: No Food  Insecurity (03/14/2023)   Hunger Vital Sign    Worried About Running Out of Food in the Last Year: Never true    Ran Out of Food in the Last Year: Never true  Transportation Needs: No Transportation Needs (03/14/2023)   PRAPARE - Administrator, Civil Service (Medical): No    Lack of Transportation (Non-Medical): No  Physical Activity: Not on file  Stress: Not on file  Social Connections: Moderately Isolated (03/14/2023)   Social Connection and Isolation Panel [NHANES]    Frequency of Communication with Friends and Family: Twice a week    Frequency of Social Gatherings with Friends and Family: Three times a week    Attends Religious Services: More than 4 times per year    Active Member of Clubs or Organizations: No    Attends Banker Meetings: Never    Marital Status:  Widowed  Intimate Partner Violence: Not At Risk (03/14/2023)   Humiliation, Afraid, Rape, and Kick questionnaire    Fear of Current or Ex-Partner: No    Emotionally Abused: No    Physically Abused: No    Sexually Abused: No    Family History:  Family History  Problem Relation Age of Onset   Heart failure Mother     Medications:   Current Outpatient Medications on File Prior to Visit  Medication Sig Dispense Refill   acetaminophen (TYLENOL) 325 MG tablet Take 2 tablets (650 mg total) by mouth every 4 (four) hours as needed for mild pain (pain score 1-3) (or temp > 37.5 C (99.5 F)).     ALPRAZolam (XANAX) 0.25 MG tablet Take 1 tablet (0.25 mg total) by mouth at bedtime as needed for anxiety or sleep. 30 tablet 0   amLODipine (NORVASC) 10 MG tablet TAKE 1 TABLET BY MOUTH EVERY  MORNING 90 tablet 3   amLODipine (NORVASC) 5 MG tablet Take 1 tablet (5 mg total) by mouth daily. 90 tablet 0   apixaban (ELIQUIS) 2.5 MG TABS tablet Take 1 tablet (2.5 mg total) by mouth 2 (two) times daily. 180 tablet 1   atorvastatin (LIPITOR) 80 MG tablet Take 1 tablet (80 mg total) by mouth every evening. 30 tablet 0   carvedilol (COREG) 12.5 MG tablet TAKE 1 TABLET BY MOUTH EVERY  MORNING AND TAKE 1 TABLET BY  MOUTH EVERY DAY AT BEDTIME 180 tablet 3   diclofenac Sodium (VOLTAREN) 1 % GEL Apply 4 g topically 4 (four) times daily. 500 g 1   Ferrous Sulfate (IRON PO) Take 1 tablet by mouth daily.     isosorbide mononitrate (IMDUR) 30 MG 24 hr tablet Take 0.5 tablets (15 mg total) by mouth daily. 45 tablet 3   mirtazapine (REMERON) 15 MG tablet Take 1 tablet (15 mg total) by mouth at bedtime. 30 tablet 0   nitroGLYCERIN (NITROSTAT) 0.4 MG SL tablet Dissolve 1 tab under tongue as needed for chest pain. May repeat every 5 minutes x 2 doses. If no relief call 9-1-1. 25 tablet 11   pantoprazole (PROTONIX) 40 MG tablet Take 1 tablet by mouth 1/2 to 1 hour before morning meal 30 tablet 2   sucralfate (CARAFATE) 1 g  tablet Take 1 tablet by mouth four times daily (with meals and at bedtime) 120 tablet 0   No current facility-administered medications on file prior to visit.    Allergies:   Allergies  Allergen Reactions   Amlodipine Besylate     Other reaction(s): 10 mg leads to swelling; tolerates 5  mg   Mirabegron     Other reaction(s): Unknown   Vibegron     Other reaction(s): mental status changes      OBJECTIVE:  Physical Exam  There were no vitals filed for this visit. There is no height or weight on file to calculate BMI. No results found.   General: well developed, well nourished, seated, in no evident distress Head: head normocephalic and atraumatic.   Neck: supple with no carotid or supraclavicular bruits Cardiovascular: regular rate and rhythm, no murmurs Musculoskeletal: no deformity Skin:  no rash/petichiae Vascular:  Normal pulses all extremities   Neurologic Exam Mental Status: Awake and fully alert. Oriented to place and time. Recent and remote memory intact. Attention span, concentration and fund of knowledge appropriate. Mood and affect appropriate.  Cranial Nerves: Fundoscopic exam reveals sharp disc margins. Pupils equal, briskly reactive to light. Extraocular movements full without nystagmus. Visual fields full to confrontation. Hearing intact. Facial sensation intact. Face, tongue, palate moves normally and symmetrically.  Motor: Normal bulk and tone. Normal strength in all tested extremity muscles Sensory.: intact to touch , pinprick , position and vibratory sensation.  Coordination: Rapid alternating movements normal in all extremities. Finger-to-nose and heel-to-shin performed accurately bilaterally. Gait and Station: Arises from chair without difficulty. Stance is normal. Gait demonstrates normal stride length and balance with ***. Tandem walk and heel toe ***.  Reflexes: 1+ and symmetric. Toes downgoing.     NIHSS  *** Modified Rankin   ***      ASSESSMENT: Brandon Harris is a 89 y.o. year old male with right MCA cortical several small infarcts on 03/13/2023 embolic, likely secondary to newly diagnosed A-fib and RVR. Vascular risk factors include new dx of A-fib, HTN, HLD, advanced age, bilateral carotid stenosis and history of stroke 30 years ago.      PLAN:  Right MCA stroke:  Residual deficit: ***.  Continue Eilquis 2.5mg  twice daily and atorvastatin (Lipitor) 80 mg daily for secondary stroke prevention managed/prescribed by PCP.   Discussed secondary stroke prevention measures and importance of close PCP follow up for aggressive stroke risk factor management including BP goal<130/90, HLD with LDL goal<70 and DM with A1c.<7 .  Stroke labs 03/2023: LDL 35, A1c 5.5 I have gone over the pathophysiology of stroke, warning signs and symptoms, risk factors and their management in some detail with instructions to go to the closest emergency room for symptoms of concern.     Follow up in *** or call earlier if needed   CC:  [facility] provider: Dr. Pearlean Brownie PCP: Tally Joe, MD    I spent *** minutes of face-to-face and non-face-to-face time with patient.  This included previsit chart review including review of recent hospitalization, lab review, study review, order entry, electronic health record documentation, patient education regarding recent stroke including etiology, secondary stroke prevention measures and importance of managing stroke risk factors, residual deficits and typical recovery time and answered all other questions to patient satisfaction   Ihor Austin, AGNP-BC  [facility] [address] [place] at [place], [place] [address]  Phone [phone] Fax [phone] Note: This document was prepared with digital dictation and possible smart phrase technology. Any transcriptional errors that result from this process are unintentional.

## 2023-05-05 ENCOUNTER — Encounter: Payer: Self-pay | Admitting: Adult Health

## 2023-05-05 ENCOUNTER — Ambulatory Visit: Payer: Self-pay | Admitting: Adult Health

## 2023-05-05 VITALS — BP 98/62 | HR 71 | Ht 71.0 in

## 2023-05-05 DIAGNOSIS — I63411 Cerebral infarction due to embolism of right middle cerebral artery: Secondary | ICD-10-CM

## 2023-05-05 DIAGNOSIS — I4891 Unspecified atrial fibrillation: Secondary | ICD-10-CM | POA: Diagnosis not present

## 2023-05-05 NOTE — Patient Instructions (Signed)
 Continue to do exercises at home, please call if interested in pursing any formal therapies   Continue  Eliquis 2.5 mg twice daily   and atorvastatin 80 mg daily for secondary stroke prevention  Continue to follow with cardiology for atrial fibrillation and Eliquis management  Continue to follow up with PCP regarding blood pressure and cholesterol management  Maintain strict control of hypertension with blood pressure goal below 130/90 and cholesterol with LDL cholesterol (bad cholesterol) goal below 70 mg/dL.   Signs of a Stroke? Follow the BEFAST method:  Balance Watch for a sudden loss of balance, trouble with coordination or vertigo Eyes Is there a sudden loss of vision in one or both eyes? Or double vision?  Face: Ask the person to smile. Does one side of the face droop or is it numb?  Arms: Ask the person to raise both arms. Does one arm drift downward? Is there weakness or numbness of a leg? Speech: Ask the person to repeat a simple phrase. Does the speech sound slurred/strange? Is the person confused ? Time: If you observe any of these signs, call [phone].        Thank you for coming to see [place] at [facility]. I hope we have been able to provide you high quality care today.  You may receive a patient satisfaction survey over the next few weeks. We would appreciate your feedback and comments so that we may continue to improve ourselves and the health of our patients.

## 2023-05-05 NOTE — Progress Notes (Signed)
 I agree with the above plan

## 2023-05-06 ENCOUNTER — Encounter (HOSPITAL_COMMUNITY)
Admission: RE | Admit: 2023-05-06 | Discharge: 2023-05-06 | Disposition: A | Discharge status: Home / Self Care | Source: Ambulatory Visit

## 2023-05-06 DIAGNOSIS — C61 Malignant neoplasm of prostate: Secondary | ICD-10-CM | POA: Diagnosis present

## 2023-05-06 MED ORDER — FLOTUFOLASTAT F 18 GALLIUM 296-5846 MBQ/ML IV SOLN
8.3640 | Freq: Once | INTRAVENOUS | Status: AC
  Administered 2023-05-06: 8.364 via INTRAVENOUS

## 2023-05-11 ENCOUNTER — Ambulatory Visit

## 2023-05-11 VITALS — BP 118/70 | HR 75 | Temp 97.5°F | Resp 22 | Ht 71.0 in | Wt 189.0 lb

## 2023-05-11 DIAGNOSIS — D649 Anemia, unspecified: Secondary | ICD-10-CM

## 2023-05-11 DIAGNOSIS — D508 Other iron deficiency anemias: Secondary | ICD-10-CM

## 2023-05-11 MED ORDER — SODIUM CHLORIDE 0.9 % IV SOLN
510.0000 mg | Freq: Once | INTRAVENOUS | Status: AC
  Administered 2023-05-11: 510 mg via INTRAVENOUS
  Filled 2023-05-11: qty 17

## 2023-05-11 NOTE — Progress Notes (Signed)
 Diagnosis: Iron Deficiency Anemia  Provider:  Chilton Greathouse MD  Procedure: IV Infusion  IV Type: Peripheral, IV Location: L Forearm  Feraheme (Ferumoxytol), Dose: 510 mg  Infusion Start Time: 1314  Infusion Stop Time: 1331  Post Infusion IV Care: Observation period completed and Peripheral IV Discontinued  Discharge: Condition: Good, Destination: Home . AVS Provided  Performed by:  Adriana Mccallum, RN

## 2023-05-11 NOTE — Patient Instructions (Signed)

## 2023-05-13 DIAGNOSIS — M40204 Unspecified kyphosis, thoracic region: Secondary | ICD-10-CM | POA: Diagnosis not present

## 2023-05-13 DIAGNOSIS — Z951 Presence of aortocoronary bypass graft: Secondary | ICD-10-CM | POA: Diagnosis not present

## 2023-05-13 DIAGNOSIS — R918 Other nonspecific abnormal finding of lung field: Secondary | ICD-10-CM | POA: Diagnosis not present

## 2023-05-13 DIAGNOSIS — R053 Chronic cough: Secondary | ICD-10-CM | POA: Diagnosis not present

## 2023-05-13 DIAGNOSIS — R059 Cough, unspecified: Secondary | ICD-10-CM | POA: Diagnosis not present

## 2023-05-13 DIAGNOSIS — D509 Iron deficiency anemia, unspecified: Secondary | ICD-10-CM | POA: Diagnosis not present

## 2023-05-13 DIAGNOSIS — R5381 Other malaise: Secondary | ICD-10-CM | POA: Diagnosis not present

## 2023-05-14 ENCOUNTER — Other Ambulatory Visit: Payer: Self-pay

## 2023-05-14 DIAGNOSIS — C61 Malignant neoplasm of prostate: Secondary | ICD-10-CM

## 2023-05-14 NOTE — Progress Notes (Signed)
 RN request STAT read on PSMA PET for upcoming follow up with Dr. Cherly Hensen on 4/7.

## 2023-05-16 NOTE — Progress Notes (Deleted)
 [facility] OFFICE PROGRESS NOTE  Patient Care Team: Tally Joe, MD as PCP - General Nahser, Deloris Ping, MD as PCP - Cardiology (Cardiology) Cherlyn Cushing, RN as Oncology Nurse Navigator  Brandon Harris is a 89 y.o.male with history of history of stroke in the 1980's, right middle cerebral artery stroke, MI in 1983, atrial fibrillation with RVR, CAD/CABG(2001), Hypertension, prostate cancer, urinary urgency, CKD stage V, GERD, Hyperlipidemia, mesenteric artery stenosis, anemia of chronic disease being seen at [facility] for prostate cancer.   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 iPSA 8.91. Germline testing: not yet Somatic testing: not yet Treatment: on ADT. Last in Dec through [facility].  PSA <0.1. PSMA PET did not show metastases. Assessment & Plan   No orders of the defined types were placed in this encounter.    [name], MD  INTERVAL HISTORY: Patient returns for follow-up.  Oncology History  Prostate cancer (HCC)  09/22/2021 Initial Diagnosis   Prostate cancer (HCC)   04/26/2023 Cancer Staging   Staging form: Prostate, AJCC 8th Edition - Clinical: Stage IVB (cTX, cN0, cM1b) - Signed by [name], MD on 04/26/2023      PHYSICAL EXAMINATION: ECOG PERFORMANCE STATUS: {CHL ONC ECOG PS:[id number]}  There were no vitals filed for this visit. There were no vitals filed for this visit.  Relevant data reviewed during this visit included ***

## 2023-05-17 ENCOUNTER — Inpatient Hospital Stay

## 2023-05-18 ENCOUNTER — Inpatient Hospital Stay

## 2023-05-18 ENCOUNTER — Ambulatory Visit (INDEPENDENT_AMBULATORY_CARE_PROVIDER_SITE_OTHER)

## 2023-05-18 VITALS — BP 134/73 | HR 81 | Temp 97.1°F | Resp 16 | Ht 69.0 in | Wt 188.0 lb

## 2023-05-18 VITALS — BP 116/66 | HR 66 | Temp 97.2°F | Resp 18

## 2023-05-18 DIAGNOSIS — D508 Other iron deficiency anemias: Secondary | ICD-10-CM | POA: Insufficient documentation

## 2023-05-18 DIAGNOSIS — D649 Anemia, unspecified: Secondary | ICD-10-CM

## 2023-05-18 DIAGNOSIS — I700 Atherosclerosis of aorta: Secondary | ICD-10-CM | POA: Diagnosis not present

## 2023-05-18 DIAGNOSIS — Z7901 Long term (current) use of anticoagulants: Secondary | ICD-10-CM | POA: Diagnosis not present

## 2023-05-18 DIAGNOSIS — J90 Pleural effusion, not elsewhere classified: Secondary | ICD-10-CM | POA: Insufficient documentation

## 2023-05-18 DIAGNOSIS — I4891 Unspecified atrial fibrillation: Secondary | ICD-10-CM | POA: Insufficient documentation

## 2023-05-18 DIAGNOSIS — R531 Weakness: Secondary | ICD-10-CM | POA: Insufficient documentation

## 2023-05-18 DIAGNOSIS — C61 Malignant neoplasm of prostate: Secondary | ICD-10-CM | POA: Diagnosis present

## 2023-05-18 DIAGNOSIS — R5381 Other malaise: Secondary | ICD-10-CM | POA: Diagnosis not present

## 2023-05-18 DIAGNOSIS — J984 Other disorders of lung: Secondary | ICD-10-CM | POA: Insufficient documentation

## 2023-05-18 DIAGNOSIS — R5383 Other fatigue: Secondary | ICD-10-CM | POA: Insufficient documentation

## 2023-05-18 DIAGNOSIS — Z79899 Other long term (current) drug therapy: Secondary | ICD-10-CM | POA: Diagnosis not present

## 2023-05-18 MED ORDER — SODIUM CHLORIDE 0.9 % IV SOLN
510.0000 mg | Freq: Once | INTRAVENOUS | Status: AC
  Administered 2023-05-18: 510 mg via INTRAVENOUS
  Filled 2023-05-18: qty 17

## 2023-05-18 NOTE — Progress Notes (Signed)
 [facility] OFFICE PROGRESS NOTE  Patient Care Team: Tally Joe, MD as PCP - General Nahser, Deloris Ping, MD as PCP - Cardiology (Cardiology) Cherlyn Cushing, RN as Oncology Nurse Navigator  Brandon Harris is a 89 y.o.male with history of history of stroke in the 1980's, right middle cerebral artery stroke, MI in 1983, atrial fibrillation with RVR, CAD/CABG(2001), Hypertension, prostate cancer, urinary urgency, CKD stage V, GERD, Hyperlipidemia, mesenteric artery stenosis, anemia of chronic disease being seen at [facility] for prostate cancer.   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 iPSA 8.91. Germline testing: not yet Somatic testing: not yet Treatment: on ADT. Last in Dec through [facility].  I reviewed his labs, imaging today.  PSA <0.1. PSMA PET did not show metastases.  We discussed results today.  Patient is with his daughter today.  Clinically still feeling fairly weak in general, still trying to recover from recent stroke and falls.  No acute symptoms.  He has multiple comorbidities.  We discussed at this time, prostate cancer is not a concern.  This can be followed in the future.  Continue follow-up with other providers regarding his other chronic medical conditions.    We also discussed palliative care consult to discuss advance care planning.  If cancer recurs rapidly, he may be limited in his option due to his current performance status.  They agree with plan.  In the meantime, he will complete IV iron. Assessment & Plan Prostate cancer [facility]) Hold on additional ADT for now. Last dose in Dec (6 mo dosage) PSA, and labs in about 3 months Palliative care referral for ACP  Other iron deficiency anemia Complete 1 dose of IV iron tolerating well. Will complete second dose today. Repeat in about 3 months. Physical deconditioning   Orders Placed This Encounter  Procedures   CBC with Differential (Cancer Center Only)    Standing Status:    Future    Expiration Date:   05/17/2024   CMP (Cancer Center only)    Standing Status:   Future    Expiration Date:   05/17/2024   Ferritin    Standing Status:   Future    Expiration Date:   05/17/2024   Testosterone    Standing Status:   Future    Expiration Date:   05/17/2024   Prostate-Specific AG, Serum    Standing Status:   Future    Expiration Date:   05/17/2024     [name], MD  INTERVAL HISTORY: Patient returns for follow-up.Marland Kitchen He still feel weak in general. He has afib and gets short of breath easily. No chest pain or tightness.   No trouble urinating. No hematuria on apixaban.  No abdominal pain, diarrhea, coughing, fever, rigors or signs of infection.  Oncology history: Report of diagnosis of prostate cancer in 06/07/2019.  Gleason 3+3 = 6. iPSA 8.91. Report patient was treated with cryoablation.   Report was in some type of clinical trial with a shot   PSA: 01/2010: 0.21. 03/2015: 4.26 07/2016: 8.61 02/2017: 6.0 08/2017: 6.73 03/2018: 6.84 09/2018:10.00 10/2019: 13.2 05/2020: 14.0 11/2020: 16.2 04/2021:15.5 05/21/2022 PSA 29.5  06/23/22 PSMA PET 1. Progressive tracer affinity throughout the prostate, consistent with residual/locally recurrent disease. 2. Isolated anterior right fourth rib tracer affinity and subtle sclerosis. Favored to represent isolated osseous metastasis. Interval nondisplaced fracture since 05/22/2021 is possible but felt less likely. 3. Incidental findings, including: Aortic Atherosclerosis (ICD10-I70.0). Pulmonary artery enlargement suggests pulmonary arterial hypertension.  11/23/25 PSA  25.1 report started samples of orgovyx.   12/2022 PSA 0.96. testosterone <10   01/11/23 due to insurance cost he was switched to Eligard every 6 months.  PSA decreased to 0.96.  Report of fatigue from orgovyx.   He was admitted from 2/1 to 3/7 and then transferred to [facility] health physical medicine and rehabitation until 2/18. MRI showed scattered small acute  cortical and subcortical infarcts in the posterior right frontal and anterior right parietal lobes. He was also found to have newly diagnosed atrial fibrillation and thought to be causing his strokes. He was started on Eliquis.   04/2023 first visit to medical oncology Performance status appeared borderline at 3. PSA <0.1  05/06/23 PSMA PET 1. Response to therapy of residual/recurrent disease within the anterior prostate. 2. Multiple new anterior right rib healing fractures with tracer affinity. These are all favored to be posttraumatic. The presence of interval fractures in this area suggest the previous anterior right fourth affinity was also posttraumatic. 3. No evidence of tracer avid extra osseous metastasis. 4. New small right pleural effusion with tracer avid right lower lobe airspace disease, favoring pneumonia or aspiration. 5. Tracer affinity corresponding to anorectal wall thickening. Correlate with symptoms of proctitis. Consider physical exam correlation to exclude unlikely neoplasm. 6. Incidental findings, including: Pulmonary artery enlargement suggests pulmonary arterial hypertension. Aortic Atherosclerosis (ICD10-I70.0).  PHYSICAL EXAMINATION: ECOG PERFORMANCE STATUS: 3 - Symptomatic, >50% confined to bed  Vitals:   05/18/23 1156  BP: 116/66  Pulse: 66  Resp: 18  Temp: (!) 97.2 F (36.2 C)  SpO2: 100%   Filed Weights   Patient is on a wheelchair today. Appears frail, pale.   Relevant data reviewed during this visit included labs, imaging.

## 2023-05-18 NOTE — Assessment & Plan Note (Addendum)
 Complete 1 dose of IV iron tolerating well. Will complete second dose today. Repeat in about 3 months.

## 2023-05-18 NOTE — Assessment & Plan Note (Addendum)
 Hold on additional ADT for now. Last dose in Dec (6 mo dosage) PSA, and labs in about 3 months Palliative care referral for ACP

## 2023-05-18 NOTE — Progress Notes (Signed)
 Diagnosis: Iron Deficiency Anemia  Provider:  Chilton Greathouse MD  Procedure: IV Infusion  IV Type: Peripheral, IV Location: R Upper Arm  Feraheme (Ferumoxytol), Dose: 510 mg  Infusion Start Time: 1357  Infusion Stop Time: 1418  Post Infusion IV Care: Observation period completed and Peripheral IV Discontinued  Discharge: Condition: Good, Destination: Home . AVS Declined  Performed by:  Rico Ala, LPN

## 2023-05-19 ENCOUNTER — Encounter: Payer: Self-pay | Admitting: *Deleted

## 2023-05-27 ENCOUNTER — Encounter (HOSPITAL_COMMUNITY): Payer: Self-pay

## 2023-05-27 ENCOUNTER — Other Ambulatory Visit: Payer: Self-pay

## 2023-05-27 ENCOUNTER — Emergency Department (HOSPITAL_COMMUNITY)
Admission: EM | Admit: 2023-05-27 | Discharge: 2023-05-27 | Disposition: A | Discharge status: Home / Self Care | Attending: Emergency Medicine | Admitting: Emergency Medicine

## 2023-05-27 ENCOUNTER — Emergency Department (HOSPITAL_COMMUNITY)

## 2023-05-27 ENCOUNTER — Ambulatory Visit: Admitting: Internal Medicine

## 2023-05-27 DIAGNOSIS — Z043 Encounter for examination and observation following other accident: Secondary | ICD-10-CM | POA: Diagnosis not present

## 2023-05-27 DIAGNOSIS — I129 Hypertensive chronic kidney disease with stage 1 through stage 4 chronic kidney disease, or unspecified chronic kidney disease: Secondary | ICD-10-CM | POA: Insufficient documentation

## 2023-05-27 DIAGNOSIS — Y92009 Unspecified place in unspecified non-institutional (private) residence as the place of occurrence of the external cause: Secondary | ICD-10-CM | POA: Insufficient documentation

## 2023-05-27 DIAGNOSIS — M549 Dorsalgia, unspecified: Secondary | ICD-10-CM | POA: Diagnosis not present

## 2023-05-27 DIAGNOSIS — S0181XA Laceration without foreign body of other part of head, initial encounter: Secondary | ICD-10-CM | POA: Insufficient documentation

## 2023-05-27 DIAGNOSIS — S01119A Laceration without foreign body of unspecified eyelid and periocular area, initial encounter: Secondary | ICD-10-CM

## 2023-05-27 DIAGNOSIS — Z79899 Other long term (current) drug therapy: Secondary | ICD-10-CM | POA: Insufficient documentation

## 2023-05-27 DIAGNOSIS — Y9301 Activity, walking, marching and hiking: Secondary | ICD-10-CM | POA: Insufficient documentation

## 2023-05-27 DIAGNOSIS — Z7901 Long term (current) use of anticoagulants: Secondary | ICD-10-CM | POA: Diagnosis not present

## 2023-05-27 DIAGNOSIS — W01198A Fall on same level from slipping, tripping and stumbling with subsequent striking against other object, initial encounter: Secondary | ICD-10-CM | POA: Insufficient documentation

## 2023-05-27 DIAGNOSIS — I4891 Unspecified atrial fibrillation: Secondary | ICD-10-CM | POA: Diagnosis not present

## 2023-05-27 DIAGNOSIS — R58 Hemorrhage, not elsewhere classified: Secondary | ICD-10-CM | POA: Diagnosis not present

## 2023-05-27 DIAGNOSIS — N189 Chronic kidney disease, unspecified: Secondary | ICD-10-CM | POA: Insufficient documentation

## 2023-05-27 DIAGNOSIS — I2510 Atherosclerotic heart disease of native coronary artery without angina pectoris: Secondary | ICD-10-CM | POA: Diagnosis not present

## 2023-05-27 DIAGNOSIS — S01112A Laceration without foreign body of left eyelid and periocular area, initial encounter: Secondary | ICD-10-CM | POA: Insufficient documentation

## 2023-05-27 DIAGNOSIS — S0993XA Unspecified injury of face, initial encounter: Secondary | ICD-10-CM | POA: Diagnosis present

## 2023-05-27 DIAGNOSIS — S51012A Laceration without foreign body of left elbow, initial encounter: Secondary | ICD-10-CM | POA: Insufficient documentation

## 2023-05-27 DIAGNOSIS — I6782 Cerebral ischemia: Secondary | ICD-10-CM | POA: Diagnosis not present

## 2023-05-27 DIAGNOSIS — G319 Degenerative disease of nervous system, unspecified: Secondary | ICD-10-CM | POA: Diagnosis not present

## 2023-05-27 MED ORDER — LIDOCAINE-EPINEPHRINE (PF) 2 %-1:200000 IJ SOLN
20.0000 mL | Freq: Once | INTRAMUSCULAR | Status: AC
  Administered 2023-05-27: 20 mL
  Filled 2023-05-27: qty 20

## 2023-05-27 NOTE — ED Triage Notes (Signed)
 Patient BIB EMS from home due to fall on thinners. Patient was going to the bathroom and fell. EMS reports lacerations to forehead and left elbow. Patient denies LOC. Patient is A&Ox4.

## 2023-05-27 NOTE — Discharge Instructions (Addendum)
 Apply ice as needed to reduce swelling.  Ice should be applied for up to 30 minutes at a time, 4 times a day.  Sutures need to be removed in 5 days.  If the sutures remain in longer than that, you start to get scars from the stitches pressing into the skin.  You may take acetaminophen as needed for pain.

## 2023-05-27 NOTE — ED Notes (Signed)
 Patient transported to CT

## 2023-05-27 NOTE — ED Provider Notes (Signed)
 [place] EMERGENCY DEPARTMENT AT [facility] Provider Note   CSN: [id number] Arrival date & time: 05/27/23  4098     History  Chief Complaint  Patient presents with   [name] Brandon Harris is a 89 y.o. male.  The history is provided by the patient.  Fall  He has history of hypertension, hyperlipidemia, chronic kidney disease, coronary artery disease, stroke, atrial fibrillation anticoagulated on apixaban and comes in following a fall at home.  He states that he slipped as he was going to the bathroom.  He hit his forehead suffering a laceration, also hit his left elbow.  He denies loss of consciousness.  He denies neck, back, chest, abdomen injury.  He states he is up-to-date on tetanus immunizations.   Home Medications Prior to Admission medications   Medication Sig Start Date End Date Taking? Authorizing Provider  acetaminophen (TYLENOL) 325 MG tablet Take 2 tablets (650 mg total) by mouth every 4 (four) hours as needed for mild pain (pain score 1-3) (or temp > 37.5 C (99.5 F)). 03/29/23   Angiulli, Mcarthur Rossetti, PA-C  ALPRAZolam Prudy Feeler) 0.25 MG tablet Take 1 tablet (0.25 mg total) by mouth at bedtime as needed for anxiety or sleep. 03/30/23   Angiulli, Mcarthur Rossetti, PA-C  amLODipine (NORVASC) 5 MG tablet Take 1 tablet (5 mg total) by mouth daily. 05/04/23   Nahser, Deloris Ping, MD  apixaban (ELIQUIS) 2.5 MG TABS tablet Take 1 tablet (2.5 mg total) by mouth 2 (two) times daily. 04/21/23     atorvastatin (LIPITOR) 80 MG tablet Take 1 tablet (80 mg total) by mouth every evening. 03/30/23   Angiulli, Mcarthur Rossetti, PA-C  carvedilol (COREG) 12.5 MG tablet TAKE 1 TABLET BY MOUTH EVERY  MORNING AND TAKE 1 TABLET BY  MOUTH EVERY DAY AT BEDTIME 04/14/23   Nahser, Deloris Ping, MD  diclofenac Sodium (VOLTAREN) 1 % GEL Apply 4 g topically 4 (four) times daily. 04/21/23     Ferrous Sulfate (IRON PO) Take 1 tablet by mouth daily.    [provider]  isosorbide mononitrate (IMDUR) 30 MG 24 hr tablet  Take 0.5 tablets (15 mg total) by mouth daily. 04/14/23   Nahser, Deloris Ping, MD  mirtazapine (REMERON) 15 MG tablet Take 1 tablet (15 mg total) by mouth at bedtime. 03/30/23   Angiulli, Mcarthur Rossetti, PA-C  nitroGLYCERIN (NITROSTAT) 0.4 MG SL tablet Dissolve 1 tab under tongue as needed for chest pain. May repeat every 5 minutes x 2 doses. If no relief call 9-1-1. 02/17/22   Lyn Records, MD  pantoprazole (PROTONIX) 40 MG tablet Take 1 tablet by mouth 1/2 to 1 hour before morning meal 04/21/23     sucralfate (CARAFATE) 1 g tablet Take 1 tablet by mouth four times daily (with meals and at bedtime) 04/22/23         Allergies    Amlodipine besylate, Mirabegron, and Vibegron    Review of Systems   Review of Systems  All other systems reviewed and are negative.   Physical Exam Updated Vital Signs BP 132/78   Pulse 83   Temp (!) 97.4 F (36.3 C) (Oral)   Resp 18   SpO2 99%  Physical Exam Vitals and nursing note reviewed.   89 year old male, resting comfortably and in no acute distress. Vital signs are normal. Oxygen saturation is 99%, which is normal. Head is normocephalic.  Irregular laceration is noted across the forehead above the right eyebrow. PERRLA, EOMI.  Neck is nontender. Back is nontender. Lungs are clear without rales, wheezes, or rhonchi. Chest is nontender. Heart has regular rate and rhythm without murmur. Abdomen is soft, flat, nontender. Pelvis is stable and nontender. Extremities: Small of skin tear present on the lateral aspect of left elbow without swelling or deformity.  There is full range of motion of both knees without pain.  There is 2+ pretibial edema bilaterally. Skin is warm and dry without rash. Neurologic: Mental status is normal, cranial nerves are intact, moves all extremities equally.         ED Results / Procedures / Treatments    Radiology CT Head Wo Contrast Result Date: 05/27/2023 CLINICAL DATA:  Recent fall while on blood thinners, initial  encounter EXAM: CT HEAD WITHOUT CONTRAST CT CERVICAL SPINE WITHOUT CONTRAST TECHNIQUE: Multidetector CT imaging of the head and cervical spine was performed following the standard protocol without intravenous contrast. Multiplanar CT image reconstructions of the cervical spine were also generated. RADIATION DOSE REDUCTION: This exam was performed according to the departmental dose-optimization program which includes automated exposure control, adjustment of the mA and/or kV according to patient size and/or use of iterative reconstruction technique. COMPARISON:  03/13/2023 FINDINGS: CT HEAD FINDINGS Brain: No evidence of acute infarction, hemorrhage, hydrocephalus, extra-axial collection or mass lesion/mass effect. Chronic atrophic and ischemic changes are noted. Vascular: No hyperdense vessel or unexpected calcification. Skull: Normal. Negative for fracture or focal lesion. Sinuses/Orbits: No acute finding. Other: None. CT CERVICAL SPINE FINDINGS Alignment: Within normal limits. Skull base and vertebrae: 7 cervical segments are well visualized. Vertebral body height is well maintained. Facet hypertrophic changes and osteophytic changes are seen. No acute fracture or acute facet abnormality is noted. The odontoid is within normal limits. Soft tissues and spinal canal: Surrounding soft tissue structures are within normal limits Upper chest: Lung apices are within normal limits with the exception of small right-sided pleural effusion. Other: None IMPRESSION: CT of the head: Chronic atrophic and ischemic changes. CT of the cervical spine: Multilevel degenerative change without acute abnormality. Small right pleural effusion. Electronically Signed   By: Violeta Grey M.D.   On: 05/27/2023 03:48   CT Cervical Spine Wo Contrast Result Date: 05/27/2023 CLINICAL DATA:  Recent fall while on blood thinners, initial encounter EXAM: CT HEAD WITHOUT CONTRAST CT CERVICAL SPINE WITHOUT CONTRAST TECHNIQUE: Multidetector CT imaging  of the head and cervical spine was performed following the standard protocol without intravenous contrast. Multiplanar CT image reconstructions of the cervical spine were also generated. RADIATION DOSE REDUCTION: This exam was performed according to the departmental dose-optimization program which includes automated exposure control, adjustment of the mA and/or kV according to patient size and/or use of iterative reconstruction technique. COMPARISON:  03/13/2023 FINDINGS: CT HEAD FINDINGS Brain: No evidence of acute infarction, hemorrhage, hydrocephalus, extra-axial collection or mass lesion/mass effect. Chronic atrophic and ischemic changes are noted. Vascular: No hyperdense vessel or unexpected calcification. Skull: Normal. Negative for fracture or focal lesion. Sinuses/Orbits: No acute finding. Other: None. CT CERVICAL SPINE FINDINGS Alignment: Within normal limits. Skull base and vertebrae: 7 cervical segments are well visualized. Vertebral body height is well maintained. Facet hypertrophic changes and osteophytic changes are seen. No acute fracture or acute facet abnormality is noted. The odontoid is within normal limits. Soft tissues and spinal canal: Surrounding soft tissue structures are within normal limits Upper chest: Lung apices are within normal limits with the exception of small right-sided pleural effusion. Other: None IMPRESSION: CT of the head: Chronic atrophic and ischemic  changes. CT of the cervical spine: Multilevel degenerative change without acute abnormality. Small right pleural effusion. Electronically Signed   By: Alcide Clever M.D.   On: 05/27/2023 03:48    Procedures .Laceration Repair  Date/Time: 05/27/2023 5:19 AM  Performed by: Dione Booze, MD Authorized by: Dione Booze, MD   Consent:    Consent obtained:  Verbal   Consent given by:  Patient   Risks, benefits, and alternatives were discussed: yes     Risks discussed:  Infection, pain and poor cosmetic result    Alternatives discussed:  No treatment Universal protocol:    Procedure explained and questions answered to patient or proxy's satisfaction: yes     Relevant documents present and verified: yes     Test results available: yes     Imaging studies available: yes     Required blood products, implants, devices, and special equipment available: yes     Site/side marked: yes     Immediately prior to procedure, a time out was called: yes     Patient identity confirmed:  Verbally with patient and arm band Anesthesia:    Anesthesia method:  Local infiltration   Local anesthetic:  Lidocaine 1% WITH epi Laceration details:    Location:  Face   Face location:  L eyebrow   Length (cm):  6   Depth (mm):  3 Pre-procedure details:    Preparation:  Patient was prepped and draped in usual sterile fashion and imaging obtained to evaluate for foreign bodies Exploration:    Limited defect created (wound extended): no     Hemostasis achieved with:  Direct pressure   Imaging obtained: x-ray     Imaging outcome: foreign body not noted     Wound exploration: entire depth of wound visualized     Wound extent: no foreign body and no underlying fracture     Contaminated: no   Treatment:    Area cleansed with:  Saline   Amount of cleaning:  Standard   Debridement:  None   Undermining:  None   Scar revision: no   Skin repair:    Repair method:  Sutures   Suture size:  5-0   Suture material:  Prolene   Suture technique:  Simple interrupted   Number of sutures:  10 Approximation:    Approximation:  Close Repair type:    Repair type:  Simple Post-procedure details:    Dressing:  Antibiotic ointment   Procedure completion:  Tolerated well, no immediate complications     Medications Ordered in ED Medications  lidocaine-EPINEPHrine (XYLOCAINE W/EPI) 2 %-1:200000 (PF) injection 20 mL (20 mLs Infiltration Given by Other 05/27/23 7846)    ED Course/ Medical Decision Making/ A&P                                  Medical Decision Making Amount and/or Complexity of Data Reviewed Radiology: ordered.  Risk Prescription drug management.   Fall at home with forehead laceration and skin tear to left elbow.  Patient on chronic anticoagulation.  I have ordered CT scans of head and cervical spine.  I have reviewed his past records, and he had Tdap immunization on 04/20/2019.  CT of head and cervical spine showed no evidence of acute injury.  I independently viewed the images, and agree with the radiologist's interpretation.  I closed his laceration with sutures and I am discharging him with instructions to have the  sutures removed in 5 days.  CRITICAL CARE Performed by: Alissa April Total critical care time: 35 minutes Critical care time was exclusive of separately billable procedures and treating other patients. Critical care was necessary to treat or prevent imminent or life-threatening deterioration. Critical care was time spent personally by me on the following activities: development of treatment plan with patient and/or surrogate as well as nursing, discussions with consultants, evaluation of patient's response to treatment, examination of patient, obtaining history from patient or surrogate, ordering and performing treatments and interventions, ordering and review of laboratory studies, ordering and review of radiographic studies, pulse oximetry and re-evaluation of patient's condition.  Final Clinical Impression(s) / ED Diagnoses Final diagnoses:  Fall at home, initial encounter  Laceration of eyebrow and forehead, initial encounter  Skin tear of left elbow without complication, initial encounter  Chronic anticoagulation    Rx / DC Orders ED Discharge Orders     None         Alissa April, MD 05/27/23 [phone]

## 2023-05-27 NOTE — ED Notes (Signed)
..  Trauma Response Nurse Documentation   Brandon Harris is a 89 y.o. male arriving to [facility] ED via EMS  On Eliquis (apixaban) daily. Trauma was activated as a Level 2 by charge nurse based on the following trauma criteria Elderly patients > 65 with head trauma on anti-coagulation (excluding ASA).  Patient cleared for CT by Dr. Candelaria Chaco. Pt transported to CT with trauma response nurse present to monitor. RN remained with the patient throughout their absence from the department for clinical observation.   GCS 15.  Trauma MD Arrival Time: N/A.  History   Past Medical History:  Diagnosis Date   Arthritis    Carotid arterial disease (HCC)    Chronic kidney disease (CKD), stage IV (severe) (HCC)    Diverticulosis    colonic diverticulosis   Gallstones    GERD (gastroesophageal reflux disease)    Hypertension    Myocardial infarction [facility])    Prostate cancer (HCC)    Status post coronary artery bypass grafting     intraoperative cholangiogram   Stroke [facility])    patient reports no deficits    Urinary incontinence    bladder leakage - " patient wears a pad"     Past Surgical History:  Procedure Laterality Date   BACK SURGERY     x 4, has titanium in back per patient    CHOLECYSTECTOMY, LAPAROSCOPIC     CORONARY ARTERY BYPASS GRAFT  2001   CRYOTHERAPY     Prostate Cancer   LEFT HEART CATH AND CORS/GRAFTS ANGIOGRAPHY N/A 10/27/2017   Procedure: LEFT HEART CATH AND CORS/GRAFTS ANGIOGRAPHY;  Surgeon: Arty Binning, MD;  Location: [facility] INVASIVE CV LAB;  Service: Cardiovascular;  Laterality: N/A;   prostate cancer     ROTATOR CUFF REPAIR     LEFT   TOTAL KNEE ARTHROPLASTY Right 12/18/2014   Procedure: TOTAL RIGHT KNEE ARTHROPLASTY;  Surgeon: Claiborne Crew, MD;  Location: [facility] ORS;  Service: Orthopedics;  Laterality: Right;       Initial Focused Assessment (If applicable, or please see trauma documentation):   CT's Completed:   CT Head and CT C-Spine   Interventions:   Plan for  disposition:  D/C home  Consults completed:  none at n/A  Event Summary: Pt transported from home by EMS after witnessed fall, pt does take Eliquis. Denies LOC, lac above L eyebrow, bleeding controlled, skin tear L elbow. Pt noted to be incontinent. Pt transported to/from CT without incident on CCM.  EDP to repair lac.   MTP Summary (If applicable): NA  Bedside handoff with ED RN Gregory Leash.    Chilton Sallade Dee  Trauma Response RN  Please call TRN at [phone] for further assistance.

## 2023-05-28 ENCOUNTER — Telehealth: Payer: Self-pay | Admitting: Genetic Counselor

## 2023-05-28 NOTE — Telephone Encounter (Signed)
 Patient scheduled appointments. Patient is aware of all appointment details.

## 2023-06-08 ENCOUNTER — Inpatient Hospital Stay: Admitting: Nurse Practitioner

## 2023-06-11 DIAGNOSIS — D649 Anemia, unspecified: Secondary | ICD-10-CM | POA: Diagnosis not present

## 2023-06-11 DIAGNOSIS — G4700 Insomnia, unspecified: Secondary | ICD-10-CM | POA: Diagnosis not present

## 2023-06-11 DIAGNOSIS — N3281 Overactive bladder: Secondary | ICD-10-CM | POA: Diagnosis not present

## 2023-06-11 DIAGNOSIS — N184 Chronic kidney disease, stage 4 (severe): Secondary | ICD-10-CM | POA: Diagnosis not present

## 2023-06-11 DIAGNOSIS — Z0000 Encounter for general adult medical examination without abnormal findings: Secondary | ICD-10-CM | POA: Diagnosis not present

## 2023-06-11 DIAGNOSIS — I25111 Atherosclerotic heart disease of native coronary artery with angina pectoris with documented spasm: Secondary | ICD-10-CM | POA: Diagnosis not present

## 2023-06-11 DIAGNOSIS — I129 Hypertensive chronic kidney disease with stage 1 through stage 4 chronic kidney disease, or unspecified chronic kidney disease: Secondary | ICD-10-CM | POA: Diagnosis not present

## 2023-06-11 DIAGNOSIS — I480 Paroxysmal atrial fibrillation: Secondary | ICD-10-CM | POA: Diagnosis not present

## 2023-06-11 DIAGNOSIS — E782 Mixed hyperlipidemia: Secondary | ICD-10-CM | POA: Diagnosis not present

## 2023-06-23 DIAGNOSIS — C7951 Secondary malignant neoplasm of bone: Secondary | ICD-10-CM | POA: Diagnosis not present

## 2023-06-23 DIAGNOSIS — N3281 Overactive bladder: Secondary | ICD-10-CM | POA: Diagnosis not present

## 2023-07-06 ENCOUNTER — Inpatient Hospital Stay

## 2023-07-06 ENCOUNTER — Inpatient Hospital Stay: Admitting: Genetic Counselor

## 2023-07-18 NOTE — Progress Notes (Deleted)
 Brandon Harris, male    DOB: 01/10/1929   MRN: [id number]   Brief patient profile:  46 yowm   *** referred to pulmonary clinic 07/22/2023 by *** for ***      Pt not previously seen by PCCM service.    History of Present Illness  07/22/2023  Pulmonary/ 1st office eval/Brandon Harris  No chief complaint on file.    Dyspnea:  *** Cough: *** Sleep: *** SABA use: *** 02 use:*** LDSCT:***  No obvious day to day or daytime pattern/variability or assoc excess/ purulent sputum or mucus plugs or hemoptysis or cp or chest tightness, subjective wheeze or overt sinus or hb symptoms.    Also denies any obvious fluctuation of symptoms with weather or environmental changes or other aggravating or alleviating factors except as outlined above   No unusual exposure hx or h/o childhood pna/ asthma or knowledge of premature birth.  Current Allergies, Complete Past Medical History, Past Surgical History, Family History, and Social History were reviewed in [employer] record.  ROS  The following are not active complaints unless bolded Hoarseness, sore throat, dysphagia, dental problems, itching, sneezing,  nasal congestion or discharge of excess mucus or purulent secretions, ear ache,   fever, chills, sweats, unintended wt loss or wt gain, classically pleuritic or exertional cp,  orthopnea pnd or arm/hand swelling  or leg swelling, presyncope, palpitations, abdominal pain, anorexia, nausea, vomiting, diarrhea  or change in bowel habits or change in bladder habits, change in stools or change in urine, dysuria, hematuria,  rash, arthralgias, visual complaints, headache, numbness, weakness or ataxia or problems with walking or coordination,  change in mood or  memory.             Outpatient Medications Prior to Visit  Medication Sig Dispense Refill   acetaminophen  (TYLENOL ) 325 MG tablet Take 2 tablets (650 mg total) by mouth every 4 (four) hours as needed for mild pain (pain score 1-3) (or  temp > 37.5 C (99.5 F)).     ALPRAZolam  (XANAX ) 0.25 MG tablet Take 1 tablet (0.25 mg total) by mouth at bedtime as needed for anxiety or sleep. 30 tablet 0   amLODipine  (NORVASC ) 5 MG tablet Take 1 tablet (5 mg total) by mouth daily. 90 tablet 3   apixaban  (ELIQUIS ) 2.5 MG TABS tablet Take 1 tablet (2.5 mg total) by mouth 2 (two) times daily. 180 tablet 1   atorvastatin  (LIPITOR ) 80 MG tablet Take 1 tablet (80 mg total) by mouth every evening. 30 tablet 0   carvedilol  (COREG ) 12.5 MG tablet TAKE 1 TABLET BY MOUTH EVERY  MORNING AND TAKE 1 TABLET BY  MOUTH EVERY DAY AT BEDTIME 180 tablet 3   diclofenac  Sodium (VOLTAREN ) 1 % GEL Apply 4 g topically 4 (four) times daily. 500 g 1   Ferrous Sulfate  (IRON PO) Take 1 tablet by mouth daily.     isosorbide  mononitrate (IMDUR ) 30 MG 24 hr tablet Take 0.5 tablets (15 mg total) by mouth daily. 45 tablet 3   mirtazapine  (REMERON ) 15 MG tablet Take 1 tablet (15 mg total) by mouth at bedtime. 30 tablet 0   nitroGLYCERIN  (NITROSTAT ) 0.4 MG SL tablet Dissolve 1 tab under tongue as needed for chest pain. May repeat every 5 minutes x 2 doses. If no relief call 9-1-1. 25 tablet 11   pantoprazole  (PROTONIX ) 40 MG tablet Take 1 tablet by mouth 1/2 to 1 hour before morning meal 30 tablet 2   sucralfate  (CARAFATE ) 1 g  tablet Take 1 tablet by mouth four times daily (with meals and at bedtime) 120 tablet 0   No facility-administered medications prior to visit.    Past Medical History:  Diagnosis Date   Arthritis    Carotid arterial disease (HCC)    Chronic kidney disease (CKD), stage IV (severe) (HCC)    Diverticulosis    colonic diverticulosis   Gallstones    GERD (gastroesophageal reflux disease)    Hypertension    Myocardial infarction [facility])    Prostate cancer (HCC)    Status post coronary artery bypass grafting     intraoperative cholangiogram   Stroke [facility])    patient reports no deficits    Urinary incontinence    bladder leakage -  patient wears a  pad      Objective:     There were no vitals taken for this visit.         Assessment   No problem-specific Assessment & Plan notes found for this encounter.     Vernestine Gondola, MD 07/18/2023

## 2023-07-22 ENCOUNTER — Ambulatory Visit: Admitting: Internal Medicine

## 2023-07-29 ENCOUNTER — Encounter: Payer: Self-pay | Admitting: Genetic Counselor

## 2023-08-04 ENCOUNTER — Telehealth: Payer: Self-pay

## 2023-08-04 NOTE — Telephone Encounter (Signed)
 I re scheduled Romano's appointment due to a scheduling conflict with the New patient space. I informed Taje to return my call if any changes need to be made.

## 2023-08-09 ENCOUNTER — Other Ambulatory Visit: Payer: Self-pay | Admitting: *Deleted

## 2023-08-09 DIAGNOSIS — R531 Weakness: Secondary | ICD-10-CM | POA: Insufficient documentation

## 2023-08-09 DIAGNOSIS — R300 Dysuria: Secondary | ICD-10-CM

## 2023-08-09 NOTE — Assessment & Plan Note (Signed)
 Overall recovering

## 2023-08-09 NOTE — Progress Notes (Unsigned)
 [facility] OFFICE PROGRESS NOTE  Patient Care Team: Seabron Lenis, MD as PCP - General Nahser, Aleene PARAS, MD as PCP - Cardiology (Cardiology) Vertell Pont, RN as Oncology Nurse Navigator  Brandon Harris is a 89 y.o.male with history of history of stroke in the 1980's, right middle cerebral artery stroke, MI in 1983, atrial fibrillation with RVR, CAD/CABG(2001), Hypertension, prostate cancer, urinary urgency, CKD stage V, GERD, Hyperlipidemia, mesenteric artery stenosis, anemia of chronic disease being [facility] for [facility] cancer.   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 iPSA 8.91. Germline testing: not yet Somatic testing: not yet Treatment: on ADT. Last in Dec through [facility].   I reviewed his labs, imaging today.   PSA <0.1. PSMA PET did not show metastases.   On previous visit he was feeling fairly weak in general, still trying to recover from recent stroke and falls.  No acute symptoms.  He has multiple comorbidities.  We discussed at this time, prostate cancer is not a concern.  This can be followed in the future.  Continue follow-up with other providers regarding his other chronic medical conditions.     We also discussed palliative care consult to discuss advance care planning.  If cancer recurs rapidly, he may be limited in his option due to his current performance status.  They agree with plan.  Repeat labs today.   In the meantime, he received IV iron in April. Assessment & Plan   No orders of the defined types were placed in this encounter.    Pauletta JAYSON Chihuahua, MD  INTERVAL HISTORY: Patient returns for follow-up.  Oncology History  Prostate cancer (HCC)  09/22/2021 Initial Diagnosis   Prostate cancer (HCC)   04/26/2023 Cancer Staging   Staging form: Prostate, AJCC 8th Edition - Clinical: Stage IVB (cTX, cN0, cM1b) - Signed by Chihu[place]ua Pauletta JAYSON, MD on 04/26/2023      PHYSICAL EXAMINATION: ECOG PERFORMANCE STATUS:  {CHL ONC ECOG [phone]}  There were no vitals filed for this visit. There were no vitals filed for this visit.  GENERAL: alert, no distress and comfortable SKIN: skin color normal and no bruising or petechiae or jaundice on exposed skin EYES: normal, sclera clear OROPHARYNX: no exudate  NECK: No palpable mass LYMPH:  no palpable cervical, axillary lymphadenopathy  LUNGS: clear to auscultation and percussion with normal breathing effort HEART: regular rate & rhythm  ABDOMEN: abdomen soft, non-tender and nondistended. Musculoskeletal: no edema NEURO: no focal motor/sensory deficits  Relevant data reviewed during this visit included ***

## 2023-08-09 NOTE — Assessment & Plan Note (Signed)
 Complete 2 doses of IV iron tolerating well. Repeat ferritin today. Repeat in about 3 months.

## 2023-08-09 NOTE — Assessment & Plan Note (Signed)
 No additional prostate cancer directed treatment given functional status Transition to palliative care If physical decline, transition to hospice at that time.

## 2023-08-10 ENCOUNTER — Telehealth: Payer: Self-pay | Admitting: *Deleted

## 2023-08-10 ENCOUNTER — Inpatient Hospital Stay

## 2023-08-10 VITALS — BP 104/64 | HR 84 | Temp 97.5°F | Resp 18 | Wt 167.7 lb

## 2023-08-10 DIAGNOSIS — C61 Malignant neoplasm of prostate: Secondary | ICD-10-CM | POA: Diagnosis present

## 2023-08-10 DIAGNOSIS — R3915 Urgency of urination: Secondary | ICD-10-CM | POA: Insufficient documentation

## 2023-08-10 DIAGNOSIS — N390 Urinary tract infection, site not specified: Secondary | ICD-10-CM | POA: Insufficient documentation

## 2023-08-10 DIAGNOSIS — R300 Dysuria: Secondary | ICD-10-CM

## 2023-08-10 DIAGNOSIS — Z7189 Other specified counseling: Secondary | ICD-10-CM | POA: Insufficient documentation

## 2023-08-10 DIAGNOSIS — R531 Weakness: Secondary | ICD-10-CM | POA: Diagnosis not present

## 2023-08-10 DIAGNOSIS — Z79899 Other long term (current) drug therapy: Secondary | ICD-10-CM | POA: Insufficient documentation

## 2023-08-10 DIAGNOSIS — D508 Other iron deficiency anemias: Secondary | ICD-10-CM | POA: Insufficient documentation

## 2023-08-10 LAB — URINALYSIS, COMPLETE (UACMP) WITH MICROSCOPIC
Bacteria, UA: NONE SEEN
Bilirubin Urine: NEGATIVE
Glucose, UA: NEGATIVE mg/dL
Hgb urine dipstick: NEGATIVE
Ketones, ur: NEGATIVE mg/dL
Leukocytes,Ua: NEGATIVE
Nitrite: NEGATIVE
Protein, ur: NEGATIVE mg/dL
Specific Gravity, Urine: 1.011 (ref 1.005–1.030)
pH: 5 (ref 5.0–8.0)

## 2023-08-10 LAB — CBC WITH DIFFERENTIAL (CANCER CENTER ONLY)
Abs Immature Granulocytes: 0.04 10*3/uL (ref 0.00–0.07)
Basophils Absolute: 0.1 10*3/uL (ref 0.0–0.1)
Basophils Relative: 1 %
Eosinophils Absolute: 1.2 10*3/uL — ABNORMAL HIGH (ref 0.0–0.5)
Eosinophils Relative: 12 %
HCT: 35.6 % — ABNORMAL LOW (ref 39.0–52.0)
Hemoglobin: 12.1 g/dL — ABNORMAL LOW (ref 13.0–17.0)
Immature Granulocytes: 0 %
Lymphocytes Relative: 27 %
Lymphs Abs: 2.7 10*3/uL (ref 0.7–4.0)
MCH: 31.3 pg (ref 26.0–34.0)
MCHC: 34 g/dL (ref 30.0–36.0)
MCV: 92.2 fL (ref 80.0–100.0)
Monocytes Absolute: 1 10*3/uL (ref 0.1–1.0)
Monocytes Relative: 10 %
Neutro Abs: 4.9 10*3/uL (ref 1.7–7.7)
Neutrophils Relative %: 50 %
Platelet Count: 226 10*3/uL (ref 150–400)
RBC: 3.86 MIL/uL — ABNORMAL LOW (ref 4.22–5.81)
RDW: 14.1 % (ref 11.5–15.5)
WBC Count: 9.9 10*3/uL (ref 4.0–10.5)
nRBC: 0 % (ref 0.0–0.2)

## 2023-08-10 LAB — CMP (CANCER CENTER ONLY)
ALT: 17 U/L (ref 0–44)
AST: 18 U/L (ref 15–41)
Albumin: 3.7 g/dL (ref 3.5–5.0)
Alkaline Phosphatase: 80 U/L (ref 38–126)
Anion gap: 8 (ref 5–15)
BUN: 24 mg/dL — ABNORMAL HIGH (ref 8–23)
CO2: 23 mmol/L (ref 22–32)
Calcium: 9.4 mg/dL (ref 8.9–10.3)
Chloride: 104 mmol/L (ref 98–111)
Creatinine: 1.67 mg/dL — ABNORMAL HIGH (ref 0.61–1.24)
GFR, Estimated: 38 mL/min — ABNORMAL LOW (ref >60)
Glucose, Bld: 104 mg/dL — ABNORMAL HIGH (ref 70–99)
Potassium: 3.8 mmol/L (ref 3.5–5.1)
Sodium: 135 mmol/L (ref 135–145)
Total Bilirubin: 0.4 mg/dL (ref 0.0–1.2)
Total Protein: 6.9 g/dL (ref 6.5–8.1)

## 2023-08-10 LAB — FERRITIN: Ferritin: 188 ng/mL (ref 24–336)

## 2023-08-10 MED ORDER — SULFAMETHOXAZOLE-TRIMETHOPRIM 800-160 MG PO TABS: 1.0000 | ORAL_TABLET | Freq: Two times a day (BID) | ORAL | 0 refills | Status: AC

## 2023-08-10 NOTE — Assessment & Plan Note (Addendum)
 UA and Ucx today. Bactrim twice daily for 7 days

## 2023-08-10 NOTE — Assessment & Plan Note (Addendum)
 Discussed today. Will refer to palliative care in [facility] If physical declining, transition to hospice.

## 2023-08-10 NOTE — Telephone Encounter (Signed)
-----   Message from Pauletta JAYSON Chihuahua sent at 08/10/2023  2:25 PM EDT ----- Zorita would you see which palliative care and hospice team is in [facility] and place a referral.  The plan is to start with palliative care.  If progression of physical decline, transition to hospice. Thanks

## 2023-08-10 NOTE — Telephone Encounter (Signed)
 Referral called to [facility]. Notified of Dr Fredrik message below. Referral order placed

## 2023-08-11 LAB — URINE CULTURE

## 2023-08-11 LAB — PROSTATE-SPECIFIC AG, SERUM (LABCORP): Prostate Specific Ag, Serum: <0.1 ng/mL (ref 0.0–4.0)

## 2023-08-11 LAB — TESTOSTERONE: Testosterone: 8 ng/dL — ABNORMAL LOW (ref 264–916)

## 2023-08-12 ENCOUNTER — Ambulatory Visit: Payer: Self-pay

## 2023-08-16 ENCOUNTER — Other Ambulatory Visit

## 2023-08-16 ENCOUNTER — Ambulatory Visit

## 2023-09-02 DIAGNOSIS — N1832 Chronic kidney disease, stage 3b: Secondary | ICD-10-CM | POA: Diagnosis not present

## 2023-09-02 DIAGNOSIS — I129 Hypertensive chronic kidney disease with stage 1 through stage 4 chronic kidney disease, or unspecified chronic kidney disease: Secondary | ICD-10-CM | POA: Diagnosis not present

## 2023-09-08 ENCOUNTER — Other Ambulatory Visit: Payer: Medicare Other

## 2023-09-09 DIAGNOSIS — I129 Hypertensive chronic kidney disease with stage 1 through stage 4 chronic kidney disease, or unspecified chronic kidney disease: Secondary | ICD-10-CM | POA: Diagnosis not present

## 2023-09-09 DIAGNOSIS — I25111 Atherosclerotic heart disease of native coronary artery with angina pectoris with documented spasm: Secondary | ICD-10-CM | POA: Diagnosis not present

## 2023-09-09 DIAGNOSIS — N184 Chronic kidney disease, stage 4 (severe): Secondary | ICD-10-CM | POA: Diagnosis not present

## 2023-10-10 DIAGNOSIS — N184 Chronic kidney disease, stage 4 (severe): Secondary | ICD-10-CM | POA: Diagnosis not present

## 2023-10-10 DIAGNOSIS — I129 Hypertensive chronic kidney disease with stage 1 through stage 4 chronic kidney disease, or unspecified chronic kidney disease: Secondary | ICD-10-CM | POA: Diagnosis not present

## 2023-10-10 DIAGNOSIS — I25111 Atherosclerotic heart disease of native coronary artery with angina pectoris with documented spasm: Secondary | ICD-10-CM | POA: Diagnosis not present

## 2023-10-19 ENCOUNTER — Ambulatory Visit (HOSPITAL_BASED_OUTPATIENT_CLINIC_OR_DEPARTMENT_OTHER)

## 2023-10-19 ENCOUNTER — Ambulatory Visit (INDEPENDENT_AMBULATORY_CARE_PROVIDER_SITE_OTHER)
Admission: RE | Admit: 2023-10-19 | Discharge: 2023-10-19 | Disposition: A | Discharge status: Home / Self Care | Source: Ambulatory Visit | Attending: Nurse Practitioner | Admitting: Nurse Practitioner

## 2023-10-19 DIAGNOSIS — M810 Age-related osteoporosis without current pathological fracture: Secondary | ICD-10-CM | POA: Diagnosis not present

## 2023-10-19 DIAGNOSIS — R9721 Rising PSA following treatment for malignant neoplasm of prostate: Secondary | ICD-10-CM | POA: Diagnosis not present

## 2023-11-02 DIAGNOSIS — C7951 Secondary malignant neoplasm of bone: Secondary | ICD-10-CM | POA: Diagnosis not present

## 2023-11-02 DIAGNOSIS — N3281 Overactive bladder: Secondary | ICD-10-CM | POA: Diagnosis not present

## 2023-11-02 DIAGNOSIS — M818 Other osteoporosis without current pathological fracture: Secondary | ICD-10-CM | POA: Diagnosis not present

## 2023-11-30 DIAGNOSIS — N1832 Chronic kidney disease, stage 3b: Secondary | ICD-10-CM | POA: Diagnosis not present

## 2023-11-30 DIAGNOSIS — I129 Hypertensive chronic kidney disease with stage 1 through stage 4 chronic kidney disease, or unspecified chronic kidney disease: Secondary | ICD-10-CM | POA: Diagnosis not present

## 2023-12-24 ENCOUNTER — Other Ambulatory Visit: Payer: Self-pay | Admitting: Physician Assistant

## 2023-12-28 ENCOUNTER — Other Ambulatory Visit: Payer: Self-pay | Admitting: Physician Assistant

## 2024-01-12 ENCOUNTER — Other Ambulatory Visit: Payer: Self-pay

## 2024-01-12 NOTE — Telephone Encounter (Signed)
 Pt needs a new Rx sent into pharmacy with new provider name. Thanks

## 2024-01-14 ENCOUNTER — Other Ambulatory Visit: Payer: Self-pay | Admitting: Physician Assistant

## 2024-01-18 MED ORDER — AMLODIPINE BESYLATE 5 MG PO TABS: 5.0000 mg | ORAL_TABLET | Freq: Every day | ORAL | 0 refills | Status: AC

## 2024-01-26 ENCOUNTER — Other Ambulatory Visit: Payer: Self-pay | Admitting: Physician Assistant

## 2024-01-27 MED ORDER — CARVEDILOL 12.5 MG PO TABS: ORAL_TABLET | ORAL | 0 refills | Status: AC

## 2024-02-14 ENCOUNTER — Other Ambulatory Visit: Payer: Self-pay | Admitting: Cardiology

## 2024-02-16 ENCOUNTER — Encounter: Payer: Self-pay | Admitting: Cardiology

## 2024-02-16 ENCOUNTER — Other Ambulatory Visit: Payer: Self-pay | Admitting: Physician Assistant

## 2024-02-16 MED ORDER — ISOSORBIDE MONONITRATE ER 30 MG PO TB24: 15.0000 mg | ORAL_TABLET | Freq: Every day | ORAL | 3 refills | Status: DC

## 2024-02-16 MED ORDER — ISOSORBIDE MONONITRATE ER 30 MG PO TB24: 15.0000 mg | ORAL_TABLET | Freq: Every day | ORAL | 3 refills | Status: AC

## 2024-02-26 NOTE — Progress Notes (Unsigned)
 "  Cardiology Office Note:    Date:  02/26/2024   ID:  Brandon Harris, DOB 21-Dec-1928, MRN [id number]  PCP:  Seabron Lenis, MD  Cardiologist:  Aleene Passe, MD (Inactive) { Click to update primary MD,subspecialty MD or APP then REFRESH:1}    Referring MD: Seabron Lenis, MD   Chief Complaint: follow-up of CAD and atrial fibrillation  History of Present Illness:    Brandon Harris is a 89 y.o. male with a history of CAD s/p remote CABG in 2001, persistent atrial fibrillation on Eliquis , CVA, PAD (carotid artery, renal artery, celiac artery, and superior mesenteric artery disease), hypertension, hyperlipidemia, CKD stage IV, GERD, and prostate cancer who presents today for routine follow-up of CAD and atrial fibrillation.  Patient has a history of CAD with remote CABG in 2001. LHC in 2019 showed severe native CAD with widely patent grafts. He also has a history of PAD. Renal artery dopplers in 07/2019 showed 1-59% stenosis of bilateral renal arteries with abnormal resistive index as well as 70-99% stenosis of celiac artery and superior mesenteric artery. Carotid dopplers in 09/2019 showed 1-39% stenosis of bilateral ICAs, high resistant flow in the right vertebral artery and occlusion of left vertebral artery, and stenotic left subclavian artery.  He was admitted in 03/2023 for an acute stroke. CT/ MRI showed a small acute right frontal parietal infarct as well as moderate to severe chronic small vessel ischemic disease with numerous chronic infarcts. He was noted to be in new onset atrial fibrillation during admission. Echo showed LVEF of 65-70% with mild LVH, mildly reduced RV function, and no significant valvular disease. Carotid dopplers showed 40-59% stenosis of bilateral ICAs (although stenosis may be underestimated secondary to bulky plaque and vessel tortuosity), left vertebral artery occlusion, and normal flow through bilateral subclavian arteries. He was started on Eliquis . Initial plan was for  outpatient DCCV but at follow-up he was still quite debilitated from the stroke so plan was to wait to see if he would get stronger.   Patient presents today for follow-up. ***  CAD s/p CABG History of remote CABG in 2001. LHC in 2019 showed severe native CAD with widely patent grafts.  - Continue antianginals: Amlodipine  5mg  daily, Imdur  30mg  daily, and Coreg  12.5mg  twice daily.  - No Aspirin  given need for Eliquis .  - Continue Lipitor  80mg  daily.   Persistent Atrial Fibrillation Diagnosed in 03/2023 during admission for CVA.  - EKG today shows *** - Continue Coreg  12.5mg  twice daily.  - Continue Eliquis  2.5mg  twice daily. Reduced dose due to age and renal function.   PAD Patient has a history of carotid, renal, and celiac/ SMA disease. Renal artery dopplers in  07/2019 showed 1-59% stenosis of bilateral renal arteries with abnormal resistive index as well as 70-99% stenosis of celiac artery and superior mesenteric artery. Last carotid dopplers in 03/2023 showed 0-59% stenosis of bilateral ICAs (although stenosis may be underestimated secondary to bulky plaque and vessel tortuosity), left vertebral artery occlusion, and normal flow through bilateral subclavian arteries.  - No Aspirin  given need for Eliquis .  - Continue Lipitor  80mg  daily.  - Will repeat carotid dopplers. ***  History of CVA Admitted for CVA in 03/2023. *** - Continue Eliquis  and Crestor as above.  Hypertension BP *** - Continue current medications: Amlodipine  5mg  daily, Imdur  30mg  daily, and Coreg  12.5mg  twice daily.   Hyperlipidemia Lipid panel in 03/2023: Total Cholesterol 98, Triglycerides 161, HDL 31, LDL 35.  - Continue Lipitor  80mg  daily.  CKD Stage IV Baseline creatinine around 1.9 to 2.2   EKGs/Labs/Other Studies Reviewed:    The following studies were reviewed:  Left Cardiac Catheterization 10/27/2017: Widely patent bypass grafts including sequential saphenous vein graft to PDA and PL, saphenous vein  graft to OM, saphenous vein graft to LAD, and LIMA to OM1. Severe native vessel disease including heavy diffuse calcification in all territories. Segmental 50% left main. Ostial occlusion of the right coronary. Total occlusion of the mid LAD after the first diagonal.  The LAD beyond the saphenous vein graft insertion into the mid to distal LAD contains calcified relatively focal 95% stenosis. Total occlusion of the proximal circumflex. Normal LVEDP   Recommendations: Continue medical therapy Four additional hours of IV hydration prior to discharge.  Kidney function on Friday. Plavix  75 mg/day. Consider PTCA of LAD via the saphenous vein graft once kidneys recover and if symptoms warrant.  Diagnostic Dominance: Right  _______________  Renal Artery Dopplers 07/14/2019: Summary:  Largest Aortic Diameter: 2.1 cm    Renal:  - Right: 1-59% stenosis of the right renal artery. RRV flow present.   Cyst(s) noted. Normal size right kidney. Abnormal right   Resistive Index. Normal cortical thickness of right kidney.  - Left:  1-59% stenosis of the left renal artery. LRV flow present.    Normal size of left kidney, although smaller than the right    kidney. Abnormal left Resisitve Index. Normal cortical    thickness of the left kidney.   Mesenteric:  70 to 99% stenosis in the celiac artery and superior mesenteric artery.  Areas of limited visceral study include left parenchymal flow, mesenteric arteries,  right parenchymal flow, right renal artery, left renal artery and right kidney  size due to overlying bowel gas and shadowing.  _______________  Echocardiogram 03/14/2023: Impressions: 1. Left ventricular ejection fraction, by estimation, is 65 to 70%. Left  ventricular ejection fraction by PLAX is 66 %. The left ventricle has  normal function. The left ventricle has no regional wall motion  abnormalities. There is mild left ventricular  hypertrophy. Left ventricular diastolic function  could not be evaluated.   2. Right ventricular systolic function is mildly reduced. The right  ventricular size is normal.   3. Left atrial size was mildly dilated.   4. The mitral valve is abnormal. Trivial mitral valve regurgitation.   5. The aortic valve is tricuspid. There is mild calcification of the  aortic valve. Aortic valve regurgitation is trivial. Aortic valve  sclerosis/calcification is present, without any evidence of aortic  stenosis.   6. Aortic dilatation noted. There is borderline dilatation of the  ascending aorta, measuring 38 mm.   7. Rhythm strip during this exam demonstrates atrial fibrillation.  _______________  Carotid Artery Disease 03/15/2023: Summary:  - Right Carotid: Velocities in the right ICA are consistent with a 40-59%   stenosis. Stenosis may be underestimated secondary to bulky   plaque and vessel tortuosity.  - Left Carotid: Velocities in the left ICA are consistent with a 40-59%  stenosis. Stenosis may be underestimated secondary to bulky plaque and  vessel tortuosity.  - Vertebrals:  Right vertebral artery demonstrates antegrade flow. Left  vertebral artery demonstrates an occlusion.  - Subclavians: Normal flow hemodynamics were seen in bilateral subclavian   arteries.    EKG:  EKG ordered today. EKG personally reviewed and demonstrates ***.  Recent Labs: 03/14/2023: TSH 1.328 03/15/2023: Magnesium  2.0 08/10/2023: ALT 17; BUN 24; Creatinine 1.67; Hemoglobin 12.1; Platelet Count 226; Potassium 3.8;  Sodium 135  Recent Lipid Panel    Component Value Date/Time   CHOL 98 03/14/2023 0323   TRIG 161 (H) 03/14/2023 0323   HDL 31 (L) 03/14/2023 0323   CHOLHDL 3.2 03/14/2023 0323   VLDL 32 03/14/2023 0323   LDLCALC 35 03/14/2023 0323    Physical Exam:    Vital Signs: There were no vitals taken for this visit.    Wt Readings from Last 3 Encounters:  08/10/23 167 lb 11.2 oz (76.1 kg)  05/18/23 188 lb (85.3 kg)  05/11/23 189 lb (85.7 kg)      General: 89 y.o. male in no acute distress. HEENT: Normocephalic and atraumatic. Sclera clear.  Neck: Supple. No carotid bruits. No JVD. Heart: *** RRR. Distinct S1 and S2. No murmurs, gallops, or rubs.  Lungs: No increased work of breathing. Clear to ausculation bilaterally. No wheezes, rhonchi, or rales.  Abdomen: Soft, non-distended, and non-tender to palpation.  Extremities: No lower extremity edema.  Radial and distal pedal pulses 2+ and equal bilaterally. Skin: Warm and dry. Neuro: No focal deficits. Psych: Normal affect. Responds appropriately.   Assessment:    No diagnosis found.  Plan:     Disposition: Follow up in ***   Signed, Aline FORBES Door, PA-C  02/26/2024 9:51 AM    [facility] "

## 2024-03-03 NOTE — Progress Notes (Unsigned)
 " Cardiology Office Note   Date: 03/04/2024  ID:  Brandon Harris 1928-07-18 [id number] PCP: Seabron Lenis, MD  [facility] Providers Cardiologist: Redell Shallow, MD { Click to update primary MD,subspecialty MD or APP then REFRESH:1}    Chief Complaint: Brandon Harris is a 89 y.o.male with PMH of CAD s/p CABGx4 in 2001, hypertension, hyperlipidemia, CKD, PAF on Eliquis , recurrent stroke, prostate cancer who presents to the clinic for annual follow-up.    Brandon Harris has been followed for many years by Dr. Claudene and Dr. Alveta since his remote CABGx5. Bilateral carotid artery stenosis has been monitored nearly annually since at least 2013. He had a stress test in 2017 that was low risk for ischemia. He had a cardiac catheterization in 2019 that showed patent bypass grafts but severe stenosis beyond the point of insertion of LIMA graft. He was recommended for medical management with Plavix .  In 2020 he had increased DOE but further testing was deferred due to renal function. He was started on Lasix . Echo 2021 showed LVEF 55-60%, G1DD, mild MR, mild AR. Plavix  was discontinued and he was referred to nephrology for surveillance of CKD in 10/2021.  Unfortunately had an acute CVA 03/2023 and was found to be in atrial fibrillation. Echo showed LVEF 65-70%, no RWMAs, mild LVH, mild RV dysfunction, trivial MR, ascending aorta 38 mm. Carotid dopplers showed stable 40-59% disease bilaterally. He also had an occluded left vertebral artery. He was discharged to inpatient rehab on low-dose Eliquis  and carvedilol . Cardioversion was deferred at follow-up visit due to physical debilitation.     History of Present Illness: Today ***  ROS: Denies chest pain, shortness of breath, orthopnea, PND, lower extremity edema, palpitations, lightheadedness, dizziness, syncope, abnormal bleeding. ***  PAF: This was diagnosed in 03/2023 after an admission for an acute CVA. Cardioversion was deferred at hospital follow-up  appointment with Dr. Alveta due to physical debilitation. - Continue Eliquis  2.5 mg twice daily - appropriate dose for age and renal function - Continue carvedilol  12.5 mg twice daily  CAD s/p CABGx4: He had CABGx4 in 2001. LHC in 2019 showed patent grafts but severe stenosis beyond the point of insertion of LIMA graft. He was medically managed with Plavix  until 2023 when this was discontinued. He denies anginal symptoms today *** - Continue atorvastatin  80 mg daily - Continue carvedilol  12.5 mg twice daily - Continue as needed SL nitroglycerin   Hypertension: BP today *** - Continue amlodipine  5 mg daily - Continue carvedilol  12.5 mg twice daily - Continue Imdur  15 mg daily  Hyperlipidemia: 03/14/2023 LDL 35, HDL 31, TGs 161, total 98. 08/10/2023 AST 18, ALT 17. - Continue atorvastatin  80 mg daily  Carotid artery stenosis: Stable 40-59% stenosis bilaterally on last Doppler study in 03/2023.  CKD stage IIIb: 08/10/2023 SCr 1.67, GFR 38. Kidney function is stable. - Continue to follow with nephrology  Studies Reviewed: The following studies were reviewed today: ***      Risk Assessment/Calculations: {Does this patient have ATRIAL FIBRILLATION?[phone]}  No BP recorded.  {Refresh Note OR Click here to enter BP  :1}***            Physical Exam: VS: There were no vitals taken for this visit. Wt Readings from Last 3 Encounters:  08/10/23 167 lb 11.2 oz (76.1 kg)  05/18/23 188 lb (85.3 kg)  05/11/23 189 lb (85.7 kg)     GEN: *** Well nourished, in NAD HEENT: Normal NECK: No JVD, no carotid bruits LYMPHATICS: No lymphadenopathy  CARDIAC: ***RRR, no murmurs, rubs, gallops RESPIRATORY: Clear to auscultation without rales, wheezing or rhonchi  ABDOMEN: Soft, non-tender, non-distended MUSCULOSKELETAL: No edema, no deformity  SKIN: Warm and dry NEUROLOGIC:  Alert and oriented x 3 PSYCHIATRIC:  Normal affect   Assessment & Plan: ***     {Are you ordering a CV Procedure (e.g.  stress test, cath, DCCV, TEE, etc)?   Press F2        :[id number]}   Dispo: ***  Signed, Saddie GORMAN Cleaves, NP 03/04/2024 9:54 PM [facility] "

## 2024-03-06 ENCOUNTER — Ambulatory Visit: Admitting: Student

## 2024-04-10 ENCOUNTER — Ambulatory Visit: Admitting: Student
